# Patient Record
Sex: Female | Born: 1950 | Race: White | Hispanic: No | Marital: Married | Smoking: Former smoker
Health system: Southern US, Community
[De-identification: ages and names within clinical notes are randomized; demographics above are authoritative.]

## PROBLEM LIST (undated history)

## (undated) DIAGNOSIS — E785 Hyperlipidemia, unspecified: Secondary | ICD-10-CM

## (undated) DIAGNOSIS — F32A Depression, unspecified: Secondary | ICD-10-CM

## (undated) DIAGNOSIS — I4891 Unspecified atrial fibrillation: Secondary | ICD-10-CM

## (undated) DIAGNOSIS — I639 Cerebral infarction, unspecified: Secondary | ICD-10-CM

## (undated) DIAGNOSIS — I509 Heart failure, unspecified: Secondary | ICD-10-CM

## (undated) DIAGNOSIS — K219 Gastro-esophageal reflux disease without esophagitis: Secondary | ICD-10-CM

## (undated) DIAGNOSIS — M797 Fibromyalgia: Secondary | ICD-10-CM

## (undated) DIAGNOSIS — F329 Major depressive disorder, single episode, unspecified: Secondary | ICD-10-CM

## (undated) DIAGNOSIS — I6529 Occlusion and stenosis of unspecified carotid artery: Secondary | ICD-10-CM

## (undated) DIAGNOSIS — I809 Phlebitis and thrombophlebitis of unspecified site: Secondary | ICD-10-CM

## (undated) DIAGNOSIS — I10 Essential (primary) hypertension: Secondary | ICD-10-CM

## (undated) HISTORY — DX: Cerebral infarction, unspecified: I63.9

## (undated) HISTORY — DX: Phlebitis and thrombophlebitis of unspecified site: I80.9

## (undated) HISTORY — DX: Depression, unspecified: F32.A

## (undated) HISTORY — DX: Major depressive disorder, single episode, unspecified: F32.9

## (undated) HISTORY — DX: Hyperlipidemia, unspecified: E78.5

## (undated) HISTORY — DX: Occlusion and stenosis of unspecified carotid artery: I65.29

## (undated) HISTORY — DX: Unspecified atrial fibrillation: I48.91

## (undated) HISTORY — DX: Essential (primary) hypertension: I10

## (undated) HISTORY — DX: Fibromyalgia: M79.7

## (undated) HISTORY — PX: HERNIA REPAIR: SHX51

---
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
## 1989-05-24 HISTORY — PX: CHOLECYSTECTOMY: SHX55

## 1996-05-24 HISTORY — PX: NECK SURGERY: SHX720

## 1998-01-06 ENCOUNTER — Other Ambulatory Visit: Admission: RE | Admit: 1998-01-06 | Discharge: 1998-01-06 | Payer: Self-pay | Admitting: *Deleted

## 1998-01-08 ENCOUNTER — Ambulatory Visit (HOSPITAL_COMMUNITY): Admission: RE | Admit: 1998-01-08 | Discharge: 1998-01-08 | Payer: Self-pay | Admitting: Internal Medicine

## 1999-06-30 ENCOUNTER — Other Ambulatory Visit: Admission: RE | Admit: 1999-06-30 | Discharge: 1999-06-30 | Payer: Self-pay | Admitting: Obstetrics and Gynecology

## 1999-07-01 ENCOUNTER — Other Ambulatory Visit: Admission: RE | Admit: 1999-07-01 | Discharge: 1999-07-01 | Payer: Self-pay | Admitting: *Deleted

## 2000-08-04 ENCOUNTER — Other Ambulatory Visit: Admission: RE | Admit: 2000-08-04 | Discharge: 2000-08-04 | Payer: Self-pay | Admitting: *Deleted

## 2000-08-30 ENCOUNTER — Other Ambulatory Visit: Admission: RE | Admit: 2000-08-30 | Discharge: 2000-08-30 | Payer: Self-pay | Admitting: *Deleted

## 2001-03-14 ENCOUNTER — Encounter: Admission: RE | Admit: 2001-03-14 | Discharge: 2001-06-12 | Payer: Self-pay

## 2001-03-24 ENCOUNTER — Encounter: Payer: Self-pay | Admitting: Internal Medicine

## 2001-03-24 ENCOUNTER — Ambulatory Visit (HOSPITAL_COMMUNITY): Admission: RE | Admit: 2001-03-24 | Discharge: 2001-03-24 | Payer: Self-pay | Admitting: Internal Medicine

## 2001-06-01 ENCOUNTER — Encounter: Payer: Self-pay | Admitting: Anesthesiology

## 2001-06-01 ENCOUNTER — Ambulatory Visit (HOSPITAL_COMMUNITY): Admission: RE | Admit: 2001-06-01 | Discharge: 2001-06-01 | Payer: Self-pay | Admitting: Anesthesiology

## 2001-07-14 ENCOUNTER — Encounter: Payer: Self-pay | Admitting: Neurosurgery

## 2001-07-17 ENCOUNTER — Inpatient Hospital Stay (HOSPITAL_COMMUNITY): Admission: RE | Admit: 2001-07-17 | Discharge: 2001-07-18 | Payer: Self-pay | Admitting: Neurosurgery

## 2001-07-17 ENCOUNTER — Encounter: Payer: Self-pay | Admitting: Neurosurgery

## 2003-01-16 ENCOUNTER — Other Ambulatory Visit: Admission: RE | Admit: 2003-01-16 | Discharge: 2003-01-16 | Payer: Self-pay | Admitting: Obstetrics and Gynecology

## 2003-04-04 ENCOUNTER — Ambulatory Visit (HOSPITAL_COMMUNITY): Admission: RE | Admit: 2003-04-04 | Discharge: 2003-04-04 | Payer: Self-pay | Admitting: Internal Medicine

## 2004-05-04 ENCOUNTER — Ambulatory Visit: Payer: Self-pay | Admitting: Internal Medicine

## 2004-06-30 ENCOUNTER — Ambulatory Visit: Payer: Self-pay | Admitting: Internal Medicine

## 2004-07-14 ENCOUNTER — Ambulatory Visit: Payer: Self-pay | Admitting: Internal Medicine

## 2004-07-28 ENCOUNTER — Ambulatory Visit: Payer: Self-pay | Admitting: Internal Medicine

## 2004-08-11 ENCOUNTER — Ambulatory Visit: Payer: Self-pay | Admitting: Internal Medicine

## 2004-09-16 ENCOUNTER — Ambulatory Visit: Payer: Self-pay | Admitting: Internal Medicine

## 2004-09-30 ENCOUNTER — Ambulatory Visit: Payer: Self-pay | Admitting: Internal Medicine

## 2004-10-28 ENCOUNTER — Ambulatory Visit: Payer: Self-pay | Admitting: Internal Medicine

## 2004-11-13 ENCOUNTER — Ambulatory Visit: Payer: Self-pay | Admitting: Internal Medicine

## 2005-01-21 ENCOUNTER — Ambulatory Visit: Payer: Self-pay | Admitting: Internal Medicine

## 2005-03-25 ENCOUNTER — Ambulatory Visit: Payer: Self-pay | Admitting: Internal Medicine

## 2005-06-08 ENCOUNTER — Ambulatory Visit: Payer: Self-pay | Admitting: Internal Medicine

## 2005-06-29 ENCOUNTER — Ambulatory Visit: Payer: Self-pay | Admitting: Internal Medicine

## 2005-09-08 ENCOUNTER — Ambulatory Visit: Payer: Self-pay | Admitting: Internal Medicine

## 2005-09-29 ENCOUNTER — Ambulatory Visit: Payer: Self-pay | Admitting: Internal Medicine

## 2007-03-03 DIAGNOSIS — E785 Hyperlipidemia, unspecified: Secondary | ICD-10-CM | POA: Insufficient documentation

## 2007-03-03 DIAGNOSIS — M797 Fibromyalgia: Secondary | ICD-10-CM | POA: Insufficient documentation

## 2007-03-03 DIAGNOSIS — E1165 Type 2 diabetes mellitus with hyperglycemia: Secondary | ICD-10-CM | POA: Insufficient documentation

## 2007-03-03 DIAGNOSIS — I10 Essential (primary) hypertension: Secondary | ICD-10-CM | POA: Insufficient documentation

## 2010-06-13 ENCOUNTER — Encounter: Payer: Self-pay | Admitting: Internal Medicine

## 2011-11-08 ENCOUNTER — Ambulatory Visit (INDEPENDENT_AMBULATORY_CARE_PROVIDER_SITE_OTHER): Payer: Medicare Other | Admitting: Family Medicine

## 2011-11-08 ENCOUNTER — Encounter: Payer: Self-pay | Admitting: Family Medicine

## 2011-11-08 VITALS — BP 132/84 | HR 60 | Resp 18 | Ht 60.0 in | Wt 190.0 lb

## 2011-11-08 DIAGNOSIS — F329 Major depressive disorder, single episode, unspecified: Secondary | ICD-10-CM

## 2011-11-08 DIAGNOSIS — E119 Type 2 diabetes mellitus without complications: Secondary | ICD-10-CM

## 2011-11-08 DIAGNOSIS — E669 Obesity, unspecified: Secondary | ICD-10-CM | POA: Insufficient documentation

## 2011-11-08 DIAGNOSIS — G4700 Insomnia, unspecified: Secondary | ICD-10-CM

## 2011-11-08 DIAGNOSIS — F32A Depression, unspecified: Secondary | ICD-10-CM | POA: Insufficient documentation

## 2011-11-08 DIAGNOSIS — IMO0001 Reserved for inherently not codable concepts without codable children: Secondary | ICD-10-CM

## 2011-11-08 DIAGNOSIS — I10 Essential (primary) hypertension: Secondary | ICD-10-CM

## 2011-11-08 DIAGNOSIS — F3289 Other specified depressive episodes: Secondary | ICD-10-CM

## 2011-11-08 DIAGNOSIS — E785 Hyperlipidemia, unspecified: Secondary | ICD-10-CM

## 2011-11-08 MED ORDER — PAROXETINE HCL 20 MG PO TABS: 20.0000 mg | ORAL_TABLET | ORAL | Status: DC

## 2011-11-08 MED ORDER — TRAZODONE HCL 50 MG PO TABS: 50.0000 mg | ORAL_TABLET | Freq: Every day | ORAL | Status: DC

## 2011-11-08 MED ORDER — OMEPRAZOLE MAGNESIUM 20 MG PO TBEC: 20.0000 mg | DELAYED_RELEASE_TABLET | Freq: Every day | ORAL | Status: DC

## 2011-11-08 MED ORDER — GLYBURIDE-METFORMIN 5-500 MG PO TABS: 1.0000 | ORAL_TABLET | Freq: Two times a day (BID) | ORAL | Status: DC

## 2011-11-08 NOTE — Assessment & Plan Note (Signed)
Uncontrolled diabetic state. She is on a very large regimen of insulin as well as oral medication. I will obtain the records from her recent visit as well as her labs. She was given a sample of Humalog here in the office.

## 2011-11-08 NOTE — Assessment & Plan Note (Signed)
I will ask trazodone for her sleep

## 2011-11-08 NOTE — Patient Instructions (Addendum)
Please bring in the form for your Lyrica I will send a new prescription for your Humalog pen  I will obtain records Bring your diabetes meter or a log book  F/U in 6 weeks

## 2011-11-08 NOTE — Assessment & Plan Note (Signed)
Continue Crestor 40mg

## 2011-11-08 NOTE — Assessment & Plan Note (Signed)
She will continue Paxil. She's been on this for 30 years per report.

## 2011-11-08 NOTE — Assessment & Plan Note (Signed)
Continue low-dose ACE inhibitor blood pressure just above goal. I will monitor this this may need to be titrated up

## 2011-11-08 NOTE — Assessment & Plan Note (Signed)
I think this is definitely contributing to her depression as well. She does help a lot of family issues. I am willing to keep her on Lyrica she will bring in the form that she needs completed.

## 2011-11-08 NOTE — Progress Notes (Signed)
  Subjective:    Patient ID: Tina [name], female    DOB: 1950-10-17, 61 y.o.   MRN: [id number]  HPI Patient here to establish care. Previous primary care provider Dr. Polly Cobia. Dr.Buist- [facility] Medications and history reviewed. She's never had a colonoscopy she is overdue for mammogram. Diabetes mellitus most recent A1c 9.2% per patient. She takes 30 units of Lantus the morning and 40 in the afternoon she is also on Glucovance. She states she was recently started on Humalog flex pen 15 units with each meal. She ran out of this. She's been on insulin for the past 2 years Fibromyalgia has a history of this with pain all over. She's currently on Lyrica. She gets her prescriptions for a Lyrica drug company she has a form she will like me to complete for this. She was seen by pain clinic in [place] which they gave her epidural injections in the thoracic region however this did not help and she's gained significant weight from this.    Review of Systems GEN- denies fatigue, fever, weight loss,weakness, recent illness HEENT- denies eye drainage, change in vision, nasal discharge, CVS- denies chest pain, palpitations RESP- denies SOB, cough, wheeze ABD- denies N/V, change in stools, abd pain GU- denies dysuria, hematuria, dribbling, incontinence MSK- denies joint pain, muscle aches, injury Neuro- denies headache, dizziness, syncope, seizure activity, Psych- +insomnia, +depression       Objective:   Physical Exam GEN- NAD, alert and oriented x3, obese HEENT- PERRL, EOMI, non injected sclera, pink conjunctiva, MMM, oropharynx clear Neck- Supple, no thyromegaly, well healed surgical scar on neck  CVS- RRR, no murmur RESP-CTAB ABD-NABS,soft, NT,ND EXT- Mild pedal edema Pulses- Radial, DP- 2+ Psych-normal affect and Mood        Assessment & Plan:

## 2011-11-12 ENCOUNTER — Telehealth (INDEPENDENT_AMBULATORY_CARE_PROVIDER_SITE_OTHER): Payer: Self-pay | Admitting: *Deleted

## 2011-11-12 ENCOUNTER — Other Ambulatory Visit: Payer: Self-pay

## 2011-11-12 MED ORDER — ASPIRIN 81 MG PO TABS: 81.0000 mg | ORAL_TABLET | Freq: Every day | ORAL | Status: DC

## 2011-11-12 MED ORDER — RAMIPRIL 2.5 MG PO TABS: 2.5000 mg | ORAL_TABLET | Freq: Every day | ORAL | Status: DC

## 2011-11-12 NOTE — Telephone Encounter (Signed)
I called and left message, she can take 2 of the paxil tablets  I called and spoke with pharmacy, she is on prilosec 20mg  which is what I sent, she actually has not had her paxil since June 2012 Message left on answering machine

## 2011-11-12 NOTE — Telephone Encounter (Signed)
Tina [name] called and said she is a new patient and the doctor sent in her Paroxetine 20 mg and Omeprozole 10 mg. She said the Paroxetine should have been 40 mg and Omeprozole should have been 20 mg. Tekia also needs her Ramipril 2.5 mg and Asprin refilled. Patient's return phone number is [phone].

## 2011-11-12 NOTE — Telephone Encounter (Signed)
Ramipril already refilled. Said wrong med strengths were sent. Please advise

## 2011-11-17 ENCOUNTER — Telehealth: Payer: Self-pay | Admitting: Family Medicine

## 2011-11-17 MED ORDER — METOPROLOL TARTRATE 100 MG PO TABS: 100.0000 mg | ORAL_TABLET | Freq: Two times a day (BID) | ORAL | Status: DC

## 2011-11-17 NOTE — Telephone Encounter (Signed)
Refill sent to [facility]

## 2011-11-29 ENCOUNTER — Telehealth: Payer: Self-pay | Admitting: Family Medicine

## 2011-11-30 ENCOUNTER — Encounter: Payer: Self-pay | Admitting: Family Medicine

## 2011-11-30 ENCOUNTER — Ambulatory Visit (INDEPENDENT_AMBULATORY_CARE_PROVIDER_SITE_OTHER): Payer: Medicare Other | Admitting: Family Medicine

## 2011-11-30 VITALS — BP 140/84 | HR 65 | Resp 16 | Ht 60.0 in | Wt 192.4 lb

## 2011-11-30 DIAGNOSIS — R42 Dizziness and giddiness: Secondary | ICD-10-CM

## 2011-11-30 DIAGNOSIS — G4700 Insomnia, unspecified: Secondary | ICD-10-CM

## 2011-11-30 DIAGNOSIS — W19XXXA Unspecified fall, initial encounter: Secondary | ICD-10-CM

## 2011-11-30 DIAGNOSIS — E119 Type 2 diabetes mellitus without complications: Secondary | ICD-10-CM

## 2011-11-30 MED ORDER — METOPROLOL TARTRATE 100 MG PO TABS: 100.0000 mg | ORAL_TABLET | Freq: Two times a day (BID) | ORAL | Status: DC

## 2011-11-30 MED ORDER — TRAZODONE HCL 50 MG PO TABS: 50.0000 mg | ORAL_TABLET | Freq: Every day | ORAL | Status: DC

## 2011-11-30 MED ORDER — GLYBURIDE-METFORMIN 5-500 MG PO TABS: 1.0000 | ORAL_TABLET | Freq: Two times a day (BID) | ORAL | Status: DC

## 2011-11-30 MED ORDER — MECLIZINE HCL 25 MG PO TABS: 25.0000 mg | ORAL_TABLET | Freq: Three times a day (TID) | ORAL | Status: AC | PRN

## 2011-11-30 MED ORDER — OMEPRAZOLE MAGNESIUM 20 MG PO TBEC: 20.0000 mg | DELAYED_RELEASE_TABLET | Freq: Every day | ORAL | Status: DC

## 2011-11-30 NOTE — Patient Instructions (Addendum)
Try the meclizine for the vertigo Try the trazodone for sleep Continue current medications Increase the Lantus in AM to 25 units Get the labs done before the next visit Keep appt at end of July

## 2011-11-30 NOTE — Telephone Encounter (Signed)
Pt was seen in the office today.  

## 2011-11-30 NOTE — Progress Notes (Signed)
  Subjective:    Patient ID: Tina [name], female    DOB: Oct 07, 1950, 61 y.o.   MRN: [id number]  HPI Pt here for vertigo and ear pain x 3-4 days, she has had vertigo in the past, she fell loosing her balance a few times at home, she hit her head but denies any pain, headache, change in vision.  DM- meter today shows CBG running 200-300's, both fasting and post prandial she is giving Lantus 23 units in AM and 40 in evening, 15 units of short acting with meals Still unable to sleep did not get the trazadone sent in last time.   Review of Systems  GEN- denies fatigue, fever, weight loss,weakness, recent illness HEENT- denies eye drainage, change in vision, nasal discharge, CVS- denies chest pain, palpitations RESP- denies SOB, cough, wheeze ABD- denies N/V, change in stools, abd pain GU- denies dysuria, hematuria, dribbling, incontinence MSK- denies joint pain, muscle aches, injury Neuro- denies headache, +dizziness, syncope, seizure activity      Objective:   Physical Exam GEN- NAD, alert and oriented x3, obese HEENT- PERRL, EOMI, non injected sclera, pink conjunctiva, MMM, oropharynx clear, fundoscopic exam benign Neck- Supple, no thyromegaly, well healed surgical scar on neck  CVS- RRR, no murmur RESP-CTAB EXT- No edema Pulses- Radial, DP- 2+ Neuro-CNII-XII in tact, no focal deficits, neg rhomberg, normal gait        Assessment & Plan:

## 2011-12-01 ENCOUNTER — Encounter: Payer: Self-pay | Admitting: Family Medicine

## 2011-12-01 DIAGNOSIS — W19XXXA Unspecified fall, initial encounter: Secondary | ICD-10-CM | POA: Insufficient documentation

## 2011-12-01 DIAGNOSIS — R42 Dizziness and giddiness: Secondary | ICD-10-CM | POA: Insufficient documentation

## 2011-12-01 NOTE — Assessment & Plan Note (Signed)
Fall with no injury, no sign of concussion, no HA, exam reassuring today

## 2011-12-01 NOTE — Assessment & Plan Note (Signed)
Today she tells me she is taking a different amount of insulin, increase AM dose to 25 units, labs to be done f/u 2 weeks

## 2011-12-01 NOTE — Assessment & Plan Note (Signed)
trazodone sent again to pharmacy for sleep

## 2011-12-01 NOTE — Assessment & Plan Note (Signed)
Trial of meclizine, may need Vestibular training/PT

## 2011-12-06 ENCOUNTER — Ambulatory Visit (INDEPENDENT_AMBULATORY_CARE_PROVIDER_SITE_OTHER): Payer: Medicare Other | Admitting: Family Medicine

## 2011-12-06 ENCOUNTER — Encounter (HOSPITAL_COMMUNITY): Payer: Self-pay | Admitting: *Deleted

## 2011-12-06 ENCOUNTER — Observation Stay (HOSPITAL_COMMUNITY)
Admission: EM | Admit: 2011-12-06 | Discharge: 2011-12-07 | Disposition: A | Discharge status: Home / Self Care | Payer: Medicare Other | Attending: Internal Medicine | Admitting: Internal Medicine

## 2011-12-06 ENCOUNTER — Emergency Department (HOSPITAL_COMMUNITY): Payer: Medicare Other

## 2011-12-06 ENCOUNTER — Telehealth: Payer: Self-pay | Admitting: Family Medicine

## 2011-12-06 ENCOUNTER — Encounter: Payer: Self-pay | Admitting: Family Medicine

## 2011-12-06 VITALS — BP 146/90 | HR 64 | Resp 20 | Ht 60.0 in | Wt 201.0 lb

## 2011-12-06 DIAGNOSIS — I10 Essential (primary) hypertension: Secondary | ICD-10-CM | POA: Diagnosis present

## 2011-12-06 DIAGNOSIS — R609 Edema, unspecified: Secondary | ICD-10-CM

## 2011-12-06 DIAGNOSIS — R600 Localized edema: Secondary | ICD-10-CM | POA: Insufficient documentation

## 2011-12-06 DIAGNOSIS — F329 Major depressive disorder, single episode, unspecified: Secondary | ICD-10-CM | POA: Diagnosis present

## 2011-12-06 DIAGNOSIS — Z794 Long term (current) use of insulin: Secondary | ICD-10-CM | POA: Insufficient documentation

## 2011-12-06 DIAGNOSIS — IMO0001 Reserved for inherently not codable concepts without codable children: Secondary | ICD-10-CM

## 2011-12-06 DIAGNOSIS — R0602 Shortness of breath: Secondary | ICD-10-CM

## 2011-12-06 DIAGNOSIS — IMO0002 Reserved for concepts with insufficient information to code with codable children: Secondary | ICD-10-CM | POA: Diagnosis present

## 2011-12-06 DIAGNOSIS — R42 Dizziness and giddiness: Secondary | ICD-10-CM | POA: Diagnosis present

## 2011-12-06 DIAGNOSIS — E119 Type 2 diabetes mellitus without complications: Secondary | ICD-10-CM

## 2011-12-06 DIAGNOSIS — E669 Obesity, unspecified: Secondary | ICD-10-CM | POA: Diagnosis present

## 2011-12-06 DIAGNOSIS — I509 Heart failure, unspecified: Principal | ICD-10-CM | POA: Diagnosis present

## 2011-12-06 DIAGNOSIS — G4700 Insomnia, unspecified: Secondary | ICD-10-CM

## 2011-12-06 DIAGNOSIS — W19XXXA Unspecified fall, initial encounter: Secondary | ICD-10-CM

## 2011-12-06 DIAGNOSIS — Z79899 Other long term (current) drug therapy: Secondary | ICD-10-CM | POA: Insufficient documentation

## 2011-12-06 DIAGNOSIS — Z6838 Body mass index (BMI) 38.0-38.9, adult: Secondary | ICD-10-CM | POA: Insufficient documentation

## 2011-12-06 DIAGNOSIS — K219 Gastro-esophageal reflux disease without esophagitis: Secondary | ICD-10-CM | POA: Diagnosis present

## 2011-12-06 DIAGNOSIS — E1165 Type 2 diabetes mellitus with hyperglycemia: Secondary | ICD-10-CM | POA: Diagnosis present

## 2011-12-06 DIAGNOSIS — F32A Depression, unspecified: Secondary | ICD-10-CM | POA: Diagnosis present

## 2011-12-06 DIAGNOSIS — M797 Fibromyalgia: Secondary | ICD-10-CM | POA: Diagnosis present

## 2011-12-06 DIAGNOSIS — E785 Hyperlipidemia, unspecified: Secondary | ICD-10-CM | POA: Diagnosis present

## 2011-12-06 HISTORY — DX: Gastro-esophageal reflux disease without esophagitis: K21.9

## 2011-12-06 LAB — GLUCOSE, CAPILLARY: Glucose-Capillary: 104 mg/dL — ABNORMAL HIGH (ref 70–99)

## 2011-12-06 LAB — CBC WITH DIFFERENTIAL/PLATELET
Eosinophils Absolute: 0.1 10*3/uL (ref 0.0–0.7)
Eosinophils Relative: 2 % (ref 0–5)
Lymphs Abs: 3.7 10*3/uL (ref 0.7–4.0)
MCHC: 34.8 g/dL (ref 30.0–36.0)
Monocytes Relative: 7 % (ref 3–12)
Neutro Abs: 3.7 10*3/uL (ref 1.7–7.7)
Neutrophils Relative %: 46 % (ref 43–77)
Platelets: 182 10*3/uL (ref 150–400)
RDW: 12.8 % (ref 11.5–15.5)
WBC: 8.1 10*3/uL (ref 4.0–10.5)

## 2011-12-06 LAB — URINALYSIS, ROUTINE W REFLEX MICROSCOPIC
Glucose, UA: NEGATIVE mg/dL
Hgb urine dipstick: NEGATIVE
Ketones, ur: NEGATIVE mg/dL
Protein, ur: NEGATIVE mg/dL
pH: 7 (ref 5.0–8.0)

## 2011-12-06 LAB — CARDIAC PANEL(CRET KIN+CKTOT+MB+TROPI)
CK, MB: 1.9 ng/mL (ref 0.3–4.0)
Total CK: 93 U/L (ref 7–177)
Troponin I: <0.3 ng/mL (ref <0.30)

## 2011-12-06 LAB — COMPREHENSIVE METABOLIC PANEL
ALT: 40 U/L — ABNORMAL HIGH (ref 0–35)
Alkaline Phosphatase: 71 U/L (ref 39–117)
BUN: 12 mg/dL (ref 6–23)
CO2: 26 mEq/L (ref 19–32)
GFR calc Af Amer: >90 mL/min (ref >90)
GFR calc non Af Amer: >90 mL/min (ref >90)
Glucose, Bld: 97 mg/dL (ref 70–99)
Potassium: 4 mEq/L (ref 3.5–5.1)
Sodium: 137 mEq/L (ref 135–145)

## 2011-12-06 MED ORDER — OMEPRAZOLE MAGNESIUM 20 MG PO TBEC: 40.0000 mg | DELAYED_RELEASE_TABLET | ORAL | Status: DC

## 2011-12-06 MED ORDER — ACETAMINOPHEN 325 MG PO TABS: 650.0000 mg | ORAL_TABLET | ORAL | Status: DC | PRN

## 2011-12-06 MED ORDER — SODIUM CHLORIDE 0.9 % IV SOLN: INTRAVENOUS | Status: DC

## 2011-12-06 MED ORDER — ATORVASTATIN CALCIUM 40 MG PO TABS: 80.0000 mg | ORAL_TABLET | Freq: Every day | ORAL | Status: DC

## 2011-12-06 MED ORDER — POTASSIUM CHLORIDE CRYS ER 20 MEQ PO TBCR
40.0000 meq | EXTENDED_RELEASE_TABLET | Freq: Two times a day (BID) | ORAL | Status: DC
  Administered 2011-12-06 – 2011-12-07 (×2): 40 meq via ORAL
  Filled 2011-12-06 (×2): qty 2

## 2011-12-06 MED ORDER — FUROSEMIDE 10 MG/ML IJ SOLN
40.0000 mg | Freq: Once | INTRAMUSCULAR | Status: AC
  Administered 2011-12-06: 40 mg via INTRAVENOUS
  Filled 2011-12-06: qty 4

## 2011-12-06 MED ORDER — ONDANSETRON HCL 4 MG/2ML IJ SOLN: 4.0000 mg | INTRAMUSCULAR | Status: DC | PRN

## 2011-12-06 MED ORDER — HYDRALAZINE HCL 20 MG/ML IJ SOLN: 10.0000 mg | INTRAMUSCULAR | Status: DC | PRN

## 2011-12-06 MED ORDER — PAROXETINE HCL 20 MG PO TABS
40.0000 mg | ORAL_TABLET | Freq: Every day | ORAL | Status: DC
  Administered 2011-12-07: 40 mg via ORAL
  Filled 2011-12-06: qty 2

## 2011-12-06 MED ORDER — PANTOPRAZOLE SODIUM 40 MG PO TBEC
80.0000 mg | DELAYED_RELEASE_TABLET | Freq: Every day | ORAL | Status: DC
  Administered 2011-12-07: 80 mg via ORAL
  Filled 2011-12-06: qty 2

## 2011-12-06 MED ORDER — PREGABALIN 50 MG PO CAPS: 100.0000 mg | ORAL_CAPSULE | Freq: Every day | ORAL | Status: DC | PRN

## 2011-12-06 MED ORDER — SODIUM CHLORIDE 0.9 % IV SOLN: 250.0000 mL | INTRAVENOUS | Status: DC | PRN

## 2011-12-06 MED ORDER — SODIUM CHLORIDE 0.9 % IJ SOLN
3.0000 mL | Freq: Two times a day (BID) | INTRAMUSCULAR | Status: DC
  Administered 2011-12-06 – 2011-12-07 (×2): 3 mL via INTRAVENOUS
  Filled 2011-12-06: qty 3

## 2011-12-06 MED ORDER — FUROSEMIDE 10 MG/ML IJ SOLN
40.0000 mg | Freq: Two times a day (BID) | INTRAMUSCULAR | Status: DC
  Administered 2011-12-06 – 2011-12-07 (×2): 40 mg via INTRAVENOUS
  Filled 2011-12-06 (×2): qty 4

## 2011-12-06 MED ORDER — ASPIRIN 81 MG PO TABS
81.0000 mg | ORAL_TABLET | Freq: Every day | ORAL | Status: DC
  Filled 2011-12-06 (×2): qty 1

## 2011-12-06 MED ORDER — SODIUM CHLORIDE 0.9 % IJ SOLN: 3.0000 mL | INTRAMUSCULAR | Status: DC | PRN

## 2011-12-06 MED ORDER — MECLIZINE HCL 12.5 MG PO TABS: 25.0000 mg | ORAL_TABLET | Freq: Three times a day (TID) | ORAL | Status: DC | PRN

## 2011-12-06 MED ORDER — ENOXAPARIN SODIUM 40 MG/0.4ML ~~LOC~~ SOLN
40.0000 mg | SUBCUTANEOUS | Status: DC
  Administered 2011-12-06: 40 mg via SUBCUTANEOUS
  Filled 2011-12-06: qty 0.4

## 2011-12-06 MED ORDER — METOPROLOL TARTRATE 50 MG PO TABS
100.0000 mg | ORAL_TABLET | Freq: Two times a day (BID) | ORAL | Status: DC
  Administered 2011-12-06 – 2011-12-07 (×2): 100 mg via ORAL
  Filled 2011-12-06 (×2): qty 2

## 2011-12-06 MED ORDER — INSULIN GLARGINE 100 UNIT/ML ~~LOC~~ SOLN
50.0000 [IU] | Freq: Every day | SUBCUTANEOUS | Status: DC
  Administered 2011-12-06: 50 [IU] via SUBCUTANEOUS

## 2011-12-06 MED ORDER — LOSARTAN POTASSIUM 50 MG PO TABS
25.0000 mg | ORAL_TABLET | Freq: Every day | ORAL | Status: DC
  Administered 2011-12-06 – 2011-12-07 (×2): 25 mg via ORAL
  Filled 2011-12-06 (×2): qty 1

## 2011-12-06 NOTE — ED Provider Notes (Signed)
History   This chart was scribed for Laray Anger, DO by Shari Heritage. The patient was seen in room APA14/[facility]14.    CSN: [id number]  Arrival date & time 12/06/11  1536   First MD Initiated Contact with Patient 12/06/11 1555      Chief Complaint  Patient presents with  . Shortness of Breath    HPI Pt was seen at 1605. Tina [name] is a 61 y.o. female who presents to the Emergency Department complaining of gradual onset and worsening of persistent bilat "legs swelling" for the past 2-3 days.  Has been associated with SOB.  Pt was eval by her PMD PTA, then sent to the ED for further eval/admit.  Denies CP/palpitations, no abd pain, no N/V/D, no fevers, no tingling/numbness in extremities, no focal motor weakness.     PCP - [facility] Past Medical History  Diagnosis Date  . Hyperlipidemia   . Hypertension   . Diabetes mellitus   . Depression   . Fibromyalgia     Past Surgical History  Procedure Date  . Hernia repair approx 1969  . Tonsillectomy and adenoidectomy 1966  . Cholecystectomy 1991  . Neck surgery 1998    Family History  Problem Relation Age of Onset  . Hypertension Mother   . Hyperlipidemia Mother   . Heart disease Mother   . Stroke Sister   . Hypertension Sister   . Hyperlipidemia Sister   . Heart disease Sister     History  Substance Use Topics  . Smoking status: Former Games developer  . Smokeless tobacco: Not on file  . Alcohol Use: No    Review of Systems ROS: Statement: All systems negative except as marked or noted in the HPI; Constitutional: Negative for fever and chills. ; ; Eyes: Negative for eye pain, redness and discharge. ; ; ENMT: Negative for ear pain, hoarseness, nasal congestion, sinus pressure and sore throat. ; ; Cardiovascular: Negative for chest pain, palpitations, diaphoresis, +dyspnea and peripheral edema. ; ; Respiratory: Negative for cough, wheezing and stridor. ; ; Gastrointestinal: Negative for nausea, vomiting, diarrhea, abdominal  pain, blood in stool, hematemesis, jaundice and rectal bleeding. . ; ; Genitourinary: Negative for dysuria, flank pain and hematuria. ; ; Musculoskeletal: Negative for back pain and neck pain. Negative for swelling and trauma.; ; Skin: Negative for pruritus, rash, abrasions, blisters, bruising and skin lesion.; ; Neuro: Negative for headache, lightheadedness and neck stiffness. Negative for weakness, altered level of consciousness , altered mental status, extremity weakness, paresthesias, involuntary movement, seizure and syncope.     Allergies  Cefuroxime axetil; Enalapril maleate; Penicillins; Sulfur; Vasotec; and Welchol  Home Medications   Current Outpatient Rx  Name Route Sig Dispense Refill  . ASPIRIN 81 MG PO TABS Oral Take 1 tablet (81 mg total) by mouth daily. 90 tablet 0  . GLYBURIDE-METFORMIN 5-500 MG PO TABS Oral Take 1 tablet by mouth 2 (two) times daily with a meal. 30 tablet 3  . IBUPROFEN 800 MG PO TABS Oral Take 800 mg by mouth every 8 (eight) hours as needed.    . INSULIN GLARGINE 100 UNIT/ML [place] SOLN Subcutaneous Inject 30 Units into the skin 2 (two) times daily. 30 units in the morning and 40 units at bedtime    . INSULIN LISPRO (HUMAN) 100 UNIT/ML [place] SOLN Subcutaneous Inject into the skin 3 (three) times daily before meals.    Marland Kitchen MECLIZINE HCL 25 MG PO TABS Oral Take 1 tablet (25 mg total) by mouth 3 (three)  times daily as needed for dizziness or nausea. 30 tablet 1  . METOPROLOL TARTRATE 100 MG PO TABS Oral Take 1 tablet (100 mg total) by mouth 2 (two) times daily. 60 tablet 3  . OMEPRAZOLE MAGNESIUM 20 MG PO TBEC Oral Take 1 tablet (20 mg total) by mouth daily. 30 tablet 3  . PAROXETINE HCL 40 MG PO TABS Oral Take 40 mg by mouth every morning.    Marland Kitchen PREGABALIN 100 MG PO CAPS Oral Take 100 mg by mouth as needed.    Marland Kitchen RAMIPRIL 2.5 MG PO TABS Oral Take 1 tablet (2.5 mg total) by mouth daily. 90 tablet 00  . ROSUVASTATIN CALCIUM 40 MG PO TABS Oral Take 40 mg by mouth daily.    .  TRAZODONE HCL 50 MG PO TABS Oral Take 1 tablet (50 mg total) by mouth at bedtime. For sleep as needed 30 tablet 3    BP 151/93  Pulse 61  Temp 98.2 F (36.8 C) (Oral)  Resp 24  Ht 5' (1.524 m)  Wt 201 lb (91.173 kg)  BMI 39.26 kg/m2  SpO2 99%  Physical Exam 1610: Physical examination:  Nursing notes reviewed; Vital signs and O2 SAT reviewed;  Constitutional: Well developed, Well nourished, Well hydrated, In no acute distress; Head:  Normocephalic, atraumatic; Eyes: EOMI, PERRL, No scleral icterus; ENMT: Mouth and pharynx normal, Mucous membranes moist; Neck: Supple, Full range of motion, No lymphadenopathy; Cardiovascular: Regular rate and rhythm, No gallop; Respiratory: Breath sounds clear & equal bilaterally, No wheezes.  Speaking full sentences with ease, Normal respiratory effort/excursion; Chest: Nontender, Movement normal; Abdomen: Soft, Nontender, Nondistended, Normal bowel sounds;; Extremities: Pulses normal, No tenderness, 1-2+ pedal edema bilat, No calf edema or asymmetry.; Neuro: AA&Ox3, Major CN grossly intact.  Speech clear. No gross focal motor or sensory deficits in extremities.; Skin: Color normal, Warm, Dry.   ED Course  Procedures    MDM  MDM Reviewed: nursing note, vitals and previous chart Reviewed previous: ECG Interpretation: ECG, labs and x-ray    Date: 12/06/2011  Rate: 65  Rhythm: normal sinus rhythm  QRS Axis: normal  Intervals: normal  ST/T Wave abnormalities: normal  Conduction Disutrbances:none  Narrative Interpretation:   Old EKG Reviewed: unchanged; no significant changes from previous EKG dated 07/14/2001.  Results for orders placed during the hospital encounter of 12/06/11  PRO B NATRIURETIC PEPTIDE      Component Value Range   Pro B Natriuretic peptide (BNP) 466.5 (*) 0 - 125 pg/mL  TROPONIN I      Component Value Range   Troponin I <0.30  <0.30 ng/mL  COMPREHENSIVE METABOLIC PANEL      Component Value Range   Sodium 137  135 - 145  mEq/L   Potassium 4.0  3.5 - 5.1 mEq/L   Chloride 102  96 - 112 mEq/L   CO2 26  19 - 32 mEq/L   Glucose, Bld 97  70 - 99 mg/dL   BUN 12  6 - 23 mg/dL   Creatinine, Ser 1.19  0.50 - 1.10 mg/dL   Calcium 9.4  8.4 - 14.7 mg/dL   Total Protein 6.8  6.0 - 8.3 g/dL   Albumin 3.5  3.5 - 5.2 g/dL   AST 40 (*) 0 - 37 U/L   ALT 40 (*) 0 - 35 U/L   Alkaline Phosphatase 71  39 - 117 U/L   Total Bilirubin 0.4  0.3 - 1.2 mg/dL   GFR calc non Af Amer >90  >90  mL/min   GFR calc Af Amer >90  >90 mL/min  CBC WITH DIFFERENTIAL      Component Value Range   WBC 8.1  4.0 - 10.5 K/uL   RBC 3.83 (*) 3.87 - 5.11 MIL/uL   Hemoglobin 12.5  12.0 - 15.0 g/dL   HCT 16.1 (*) 09.6 - 04.5 %   MCV 93.7  78.0 - 100.0 fL   MCH 32.6  26.0 - 34.0 pg   MCHC 34.8  30.0 - 36.0 g/dL   RDW 40.9  81.1 - 91.4 %   Platelets 182  150 - 400 K/uL   Neutrophils Relative 46  43 - 77 %   Neutro Abs 3.7  1.7 - 7.7 K/uL   Lymphocytes Relative 46  12 - 46 %   Lymphs Abs 3.7  0.7 - 4.0 K/uL   Monocytes Relative 7  3 - 12 %   Monocytes Absolute 0.5  0.1 - 1.0 K/uL   Eosinophils Relative 2  0 - 5 %   Eosinophils Absolute 0.1  0.0 - 0.7 K/uL   Basophils Relative 0  0 - 1 %   Basophils Absolute 0.0  0.0 - 0.1 K/uL  URINALYSIS, ROUTINE W REFLEX MICROSCOPIC      Component Value Range   Color, Urine YELLOW  YELLOW   APPearance CLEAR  CLEAR   Specific Gravity, Urine 1.015  1.005 - 1.030   pH 7.0  5.0 - 8.0   Glucose, UA NEGATIVE  NEGATIVE mg/dL   Hgb urine dipstick NEGATIVE  NEGATIVE   Bilirubin Urine NEGATIVE  NEGATIVE   Ketones, ur NEGATIVE  NEGATIVE mg/dL   Protein, ur NEGATIVE  NEGATIVE mg/dL   Urobilinogen, UA 0.2  0.0 - 1.0 mg/dL   Nitrite NEGATIVE  NEGATIVE   Leukocytes, UA NEGATIVE  NEGATIVE  GLUCOSE, CAPILLARY      Component Value Range   Glucose-Capillary 104 (*) 70 - 99 mg/dL   Comment 1 Notify RN     Dg Chest 2 View 12/06/2011  *RADIOLOGY REPORT*  Clinical Data: Cough, diffuse swelling, evaluate for pulmonary  infiltrates, CHF, free air  CHEST - 2 VIEW  Comparison: None.  Findings: Normal cardiac silhouette and mediastinal contours. Mild pulmonary venous congestion without frank evidence of edema.  No focal airspace opacity.  No definite pleural effusion or pneumothorax.  Post lower cervical/upper thoracic ACDF.  No acute osseous abnormalities.  Post cholecystectomy. No definite pneumoperitoneum.  IMPRESSION: 1.  Mild pulmonary venous congestion without frank evidence of pulmonary edema.  2. No definite pneumoperitoneum.  Further evaluation with dedicated abdominal radiographic series may be performed as clinically indicated.  Original Report Authenticated By: Waynard Reeds, M.D.     1940:  New CHF, will dose IV lasix.  Dx testing d/w pt and family.  Questions answered.  Verb understanding, agreeable to admit.  T/C to [facility] Dr. Orvan Falconer, case discussed, including:  HPI, pertinent PM/SHx, VS/PE, dx testing, ED course and treatment:  Agreeable to come to ED to eval for observation admit.      I personally performed the services described in this documentation, which was scribed in my presence. The recorded information has been reviewed and considered. Alexios Keown Allison Quarry, DO 12/08/11 2201

## 2011-12-06 NOTE — Telephone Encounter (Signed)
Pt needs appt first

## 2011-12-06 NOTE — Assessment & Plan Note (Signed)
Per above we'll transfer to the ED for workup of leg edema and possible congestive heart failure

## 2011-12-06 NOTE — ED Notes (Signed)
Pt updated on status. Pt informed of admit status, waiting on bed assignment.

## 2011-12-06 NOTE — Assessment & Plan Note (Signed)
With her shortness of breath leg edema and 9 pound weight gain and concern about acute CHF. She does have other comorbidities such as hypertension and diabetes mellitus which are uncontrolled. Based on my exam today I think she warrants immediate care. She is being transferred to the emergency department I discussed with the attending. I discussed with her traveling back ambulating she declined is noting the risk of respiratory distress.

## 2011-12-06 NOTE — Assessment & Plan Note (Signed)
Elevated above baseline today

## 2011-12-06 NOTE — ED Notes (Signed)
Pt transferred to Room 330 on Telemetry by ED Tech, pt stable on transfer.

## 2011-12-06 NOTE — Telephone Encounter (Signed)
Pt to come in today at 3

## 2011-12-06 NOTE — ED Notes (Signed)
Breathing easier.  Eating crackers without difficulty.  Reports feels like she can go home.

## 2011-12-06 NOTE — ED Notes (Signed)
Sent from Dr Deirdre Peer office for eval, concerned about CHF  Pt says swelling of legs and sob. No chest pain

## 2011-12-06 NOTE — Patient Instructions (Signed)
Go straight to the ER  I am concerned about CHF Heart failure

## 2011-12-06 NOTE — ED Notes (Signed)
Bed assigned 330

## 2011-12-06 NOTE — H&P (Signed)
[facility] History and Physical  Tina [name] ZOX:[id number] DOB: 06-26-1950 DOA: 12/06/2011   PCP:   Milinda Antis, MD   Chief Complaint:  Progressive shortness of breath for 3 days  HPI: Tina [name] is an 61 y.o. female.   Obese middle-aged lady with hypertension, diabetes, hyperlipidemia, presents with progressive dyspnea on exertion, orthopnea, paroxysmal nocturnal dyspnea,  lower extremity edema and abdominal tightness for the past 3 days. She went to her primary care physician today with these symptoms and was sent to the emergency room for further evaluation. She has no history of heart failure  She did have an episode of palpitations last night but has had no chest pain, nausea or vomiting or diaphoresis.  Patient reports that prior to this she was at her baseline state of health until about one week ago  she started having episodes of vertigo aggravated by moving her head.  Rewiew of Systems:   All systems negative except as marked bold or noted in the HPI;  Constitutional: Negative for malaise, fever and chills. ;  Eyes: Negative for eye pain, redness and discharge. ;  ENMT: Negative for ear pain, hoarseness,, sinus pressure and sore throat. ;  nasal congestion, PND  Respiratory: Negative for hemoptysis, and stridor. ; chronic cough  Gastrointestinal: Negative for nausea, vomiting, diarrhea, constipation, abdominal pain, melena, blood in stool, hematemesis, jaundice and rectal bleeding. unusual weight loss.Marland Kitchen GERD  Genitourinary: Negative for frequency, dysuria, incontinence,flank pain and hematuria;  Musculoskeletal: Negative for  neck pain. Negative for swelling and trauma.; m episodic pain in muscles legs, arms, hands, back, due to fibromyalgia.  Skin: . Negative for pruritus, rash, abrasions, bruising and skin lesion.; ulcerations  Neuro: Negative for headache, lightheadedness and neck stiffness. Negative for weakness, altered level of consciousness , altered  mental status, extremity weakness, burning feet, involuntary movement, seizure and syncope.  Psych: negative for tearfulness, panic attacks, hallucinations, paranoia, suicidal or homicidal ideation; anxiety, depression, insomnia    Past Medical History  Diagnosis Date  . Hyperlipidemia   . Hypertension   . Diabetes mellitus   . Depression   . Fibromyalgia   . GERD (gastroesophageal reflux disease)     Past Surgical History  Procedure Date  . Hernia repair approx 1969  . Tonsillectomy and adenoidectomy 1966  . Cholecystectomy 1991  . Neck surgery 1998    Medications:  HOME MEDS: Prior to Admission medications   Medication Sig Start Date End Date Taking? Authorizing Provider  aspirin 81 MG tablet Take 81 mg by mouth every morning. 11/12/11  Yes Salley Scarlet, MD  glyBURIDE-metformin (GLUCOVANCE) 5-500 MG per tablet Take 1 tablet by mouth 2 (two) times daily with a meal. 11/30/11  Yes Salley Scarlet, MD  insulin glargine (LANTUS SOLOSTAR) 100 UNIT/ML injection Inject 37-40 Units into the skin 2 (two) times daily. Inject 37 units daily in the morning and 40 units as bedtime   Yes Historical Provider, MD  insulin lispro (HUMALOG KWIKPEN) 100 UNIT/ML injection Inject 15 Units into the skin 3 (three) times daily before meals. And as directed per sliding scale instructions   Yes Historical Provider, MD  meclizine (ANTIVERT) 25 MG tablet Take 1 tablet (25 mg total) by mouth 3 (three) times daily as needed for dizziness or nausea. 11/30/11 11/29/12 Yes Salley Scarlet, MD  metoprolol (LOPRESSOR) 100 MG tablet Take 1 tablet (100 mg total) by mouth 2 (two) times daily. 11/30/11 11/29/12 Yes Salley Scarlet, MD  omeprazole (PRILOSEC OTC) 20  MG tablet Take 20 mg by mouth every morning. 11/30/11  Yes Salley Scarlet, MD  PARoxetine (PAXIL) 40 MG tablet Take 40 mg by mouth every morning.   Yes Historical Provider, MD  pregabalin (LYRICA) 100 MG capsule Take 100 mg by mouth as needed. For pain   Yes  Historical Provider, MD  ramipril (ALTACE) 2.5 MG tablet Take 2.5 mg by mouth every morning. 11/12/11  Yes Salley Scarlet, MD  rosuvastatin (CRESTOR) 40 MG tablet Take 40 mg by mouth at bedtime.    Yes Historical Provider, MD     Allergies:  Allergies  Allergen Reactions  . Cefuroxime Axetil     REACTION: unspecified  . Enalapril Maleate     REACTION: cough  . Penicillins     REACTION: unspecified  . Sulfur   . Vasotec (Enalapril)   . Welchol (Colesevelam Hcl)     Muscle aches and joint pains   has cough with enalapril  Social History:   reports that she quit smoking about 19 months ago. She does not have any smokeless tobacco history on file. She reports that she does not drink alcohol or use illicit drugs.  Family History: Family History  Problem Relation Age of Onset  . Hypertension Mother   . Hyperlipidemia Mother   . Heart disease Mother   . Stroke Sister   . Hypertension Sister   . Hyperlipidemia Sister   . Heart disease Sister      Physical Exam: Filed Vitals:   12/06/11 1643 12/06/11 1847 12/06/11 1900 12/06/11 2101  BP: 163/94 147/89 132/96 142/71  Pulse: 66 83 58 61  Temp:    98.1 F (36.7 C)  TempSrc:    Oral  Resp:  18 24 22   Height:    5' (1.524 m)  Weight:    89.54 kg (197 lb 6.4 oz)  SpO2:  98% 98% 96%   Blood pressure 142/71, pulse 61, temperature 98.1 F (36.7 C), temperature source Oral, resp. rate 22, height 5' (1.524 m), weight 89.54 kg (197 lb 6.4 oz), SpO2 96.00%.  GEN:  Pleasant obese Caucasian lady reclining in bed in mild respiratory distress; cooperative with exam PSYCH:  alert and oriented x4; does ; affect is appropriate. HEENT: Mucous membranes pink and anicteric; PERRLA; EOM intact; no cervical lymphadenopathy nor thyromegaly or carotid bruit;  JVD about 8 cm; Breasts:: Not examined CHEST WALL: No tenderness CHEST: Tachypnea , clear to auscultation bilaterally,  No crackles no wheezing HEART: Regular rate and rhythm; no murmurs  rubs or gallops BACK: No kyphosis or scoliosis; no CVA tenderness ABDOMEN: Obese, soft non-tender; no masses, right flank dullness no organomegaly, normal abdominal bowel sounds;  Rectal Exam: Not done EXTREMITIES:; age-appropriate arthropathy of the hands and knees; trace edema; no ulcerations. Genitalia: not examined PULSES: 2+ and symmetric SKIN: Normal hydration no rash or ulceration CNS: Cranial nerves 2-12 grossly intact no focal lateralizing neurologic deficit   Labs on Admission:  Basic Metabolic Panel:   Lab 12/06/11 2110 12/06/11 1608  GLUCAP 153* 104*    Results for orders placed during the hospital encounter of 12/06/11 (from the past 48 hour(s))  GLUCOSE, CAPILLARY     Status: Abnormal   Collection Time   12/06/11  4:08 PM      Component Value Range Comment   Glucose-Capillary 104 (*) 70 - 99 mg/dL    Comment 1 Notify RN     URINALYSIS, ROUTINE W REFLEX MICROSCOPIC     Status: Normal  Collection Time   12/06/11  5:55 PM      Component Value Range Comment   Color, Urine YELLOW  YELLOW    APPearance CLEAR  CLEAR    Specific Gravity, Urine 1.015  1.005 - 1.030    pH 7.0  5.0 - 8.0    Glucose, UA NEGATIVE  NEGATIVE mg/dL    Hgb urine dipstick NEGATIVE  NEGATIVE    Bilirubin Urine NEGATIVE  NEGATIVE    Ketones, ur NEGATIVE  NEGATIVE mg/dL    Protein, ur NEGATIVE  NEGATIVE mg/dL    Urobilinogen, UA 0.2  0.0 - 1.0 mg/dL    Nitrite NEGATIVE  NEGATIVE    Leukocytes, UA NEGATIVE  NEGATIVE MICROSCOPIC NOT DONE ON URINES WITH NEGATIVE PROTEIN, BLOOD, LEUKOCYTES, NITRITE, OR GLUCOSE <1000 mg/dL.  PRO B NATRIURETIC PEPTIDE     Status: Abnormal   Collection Time   12/06/11  6:22 PM      Component Value Range Comment   Pro B Natriuretic peptide (BNP) 466.5 (*) 0 - 125 pg/mL   TROPONIN I     Status: Normal   Collection Time   12/06/11  6:22 PM      Component Value Range Comment   Troponin I <0.30  <0.30 ng/mL   COMPREHENSIVE METABOLIC PANEL     Status: Abnormal    Collection Time   12/06/11  6:22 PM      Component Value Range Comment   Sodium 137  135 - 145 mEq/L    Potassium 4.0  3.5 - 5.1 mEq/L    Chloride 102  96 - 112 mEq/L    CO2 26  19 - 32 mEq/L    Glucose, Bld 97  70 - 99 mg/dL    BUN 12  6 - 23 mg/dL    Creatinine, Ser 4.54  0.50 - 1.10 mg/dL    Calcium 9.4  8.4 - 09.8 mg/dL    Total Protein 6.8  6.0 - 8.3 g/dL    Albumin 3.5  3.5 - 5.2 g/dL    AST 40 (*) 0 - 37 U/L    ALT 40 (*) 0 - 35 U/L    Alkaline Phosphatase 71  39 - 117 U/L    Total Bilirubin 0.4  0.3 - 1.2 mg/dL    GFR calc non Af Amer >90  >90 mL/min    GFR calc Af Amer >90  >90 mL/min   CBC WITH DIFFERENTIAL     Status: Abnormal   Collection Time   12/06/11  6:22 PM      Component Value Range Comment   WBC 8.1  4.0 - 10.5 K/uL    RBC 3.83 (*) 3.87 - 5.11 MIL/uL    Hemoglobin 12.5  12.0 - 15.0 g/dL    HCT 11.9 (*) 14.7 - 46.0 %    MCV 93.7  78.0 - 100.0 fL    MCH 32.6  26.0 - 34.0 pg    MCHC 34.8  30.0 - 36.0 g/dL    RDW 82.9  56.2 - 13.0 %    Platelets 182  150 - 400 K/uL    Neutrophils Relative 46  43 - 77 %    Neutro Abs 3.7  1.7 - 7.7 K/uL    Lymphocytes Relative 46  12 - 46 %    Lymphs Abs 3.7  0.7 - 4.0 K/uL    Monocytes Relative 7  3 - 12 %    Monocytes Absolute 0.5  0.1 - 1.0 K/uL  Eosinophils Relative 2  0 - 5 %    Eosinophils Absolute 0.1  0.0 - 0.7 K/uL    Basophils Relative 0  0 - 1 %    Basophils Absolute 0.0  0.0 - 0.1 K/uL   GLUCOSE, CAPILLARY     Status: Abnormal   Collection Time   12/06/11  9:10 PM      Component Value Range Comment   Glucose-Capillary 153 (*) 70 - 99 mg/dL    Comment 1 Notify RN        Radiological Exams on Admission: Dg Chest 2 View  12/06/2011  *RADIOLOGY REPORT*  Clinical Data: Cough, diffuse swelling, evaluate for pulmonary infiltrates, CHF, free air  CHEST - 2 VIEW  Comparison: None.  Findings: Normal cardiac silhouette and mediastinal contours. Mild pulmonary venous congestion without frank evidence of edema.  No  focal airspace opacity.  No definite pleural effusion or pneumothorax.  Post lower cervical/upper thoracic ACDF.  No acute osseous abnormalities.  Post cholecystectomy. No definite pneumoperitoneum.  IMPRESSION: 1.  Mild pulmonary venous congestion without frank evidence of pulmonary edema.  2. No definite pneumoperitoneum.  Further evaluation with dedicated abdominal radiographic series may be performed as clinically indicated.  Original Report Authenticated By: Waynard Reeds, M.D.    EKG: Independently reviewed. Normal sinus rhythm no ST segment abnormalities   Assessment/Plan Present on Admission: Acute congestive heart failure, new onset   We'll admit on heart failure portal for diuresis and evaluation of ejection fraction; since she has a cough with enalapril, and is now having a chronic cough on ramipril, will discontinue ACE inhibitors completely and start an ARB trial. We'll continue her beta blocker since she is taking high doses, and blood pressure is borderline high. Cycle cardiac enzymes, although initially negative, looking for cause of sudden onset heart failure.   Marland KitchenDIABETES MELLITUS, TYPE II  Check hemoglobin A1c, once daily Lantus, discontinue glyburide, supplement short acting insulin  .Depression .FIBROMYALGIA .GERD (gastroesophageal reflux disease) .HYPERLIPIDEMIA .HYPERTENSION .Obesity, unspecified .Vertigo  Continue management of other chronic medical problems;  Discuss the importance of the diet and exercise in weight management.   Other plans as per orders.  Code Status: Full code Family Communication: Ex-husband was present for most of the interview and examination Disposition Plan:  Patient is admitted on observation; if not sufficient to optimize tomorrow may need to be converted to a full admission; may need cardiology evaluation as an in or outpatient   Tina [name] Nocturnist [facility] Pager [phone]   12/06/2011, 10:32 PM

## 2011-12-06 NOTE — Progress Notes (Signed)
  Subjective:    Patient ID: Tina [name], female    DOB: [birth year], 61 y.o.   MRN: [id number]  HPI Patient presents with progressive swelling in her lower extremities and hands. She is also short of breath. She states over the weekend she began to be short of breath and noticed swelling. She continued to drink water thinking she was not drinking enough. Her only new medication is trazodone which gave her bad dreams and she stopped this last week. She has never had shortness of breath and swelling this bad before. Has no history of congestive heart failure or liver disease. She's been taking her insulin as directed and noted that she had a hypoglycemic episode yesterday with a CBG to the 70s. She also continues to have dizzy spells.  She states she has gained 10 pounds since her last visit.  Review of Systems - per above   GEN- + fatigue, fever, weight loss,weakness, recent illness HEENT- denies eye drainage, change in vision, nasal discharge, CVS- denies chest pain, palpitations RESP- + SOB, +cough, +wheeze ABD- denies N/V, change in stools, abd pain GU- denies dysuria, hematuria, dribbling, incontinence MSK- + joint pain,+ muscle aches, injury Neuro- denies headache, +dizziness, syncope, seizure activity      Objective:   Physical Exam GEN- NAD, alert and oriented x3, obese, 9lb weight gain HEENT- PERRL, EOMI, non injected sclera, pink conjunctiva, MMM, oropharynx clear, Neck- Supple, hepatojular reflux elevated  CVS- RRR, no murmur RESP-decreased right base otherwise clear with and upper airway wheeze, increased WOB EXT- 1+ pitting edema lower ext, swelling in hands Pulses- Radial, DP- 2+ Neuro-CNII-XII in tact, no focal deficits, neg rhomberg, normal gait       Assessment & Plan:

## 2011-12-06 NOTE — ED Notes (Signed)
Pt ambulated to bathroom, no SOB noted on ambulation.

## 2011-12-07 DIAGNOSIS — E119 Type 2 diabetes mellitus without complications: Secondary | ICD-10-CM

## 2011-12-07 DIAGNOSIS — I10 Essential (primary) hypertension: Secondary | ICD-10-CM

## 2011-12-07 DIAGNOSIS — I517 Cardiomegaly: Secondary | ICD-10-CM

## 2011-12-07 DIAGNOSIS — R609 Edema, unspecified: Secondary | ICD-10-CM

## 2011-12-07 LAB — BASIC METABOLIC PANEL
BUN: 15 mg/dL (ref 6–23)
CO2: 32 mEq/L (ref 19–32)
GFR calc non Af Amer: 62 mL/min — ABNORMAL LOW (ref >90)
Glucose, Bld: 118 mg/dL — ABNORMAL HIGH (ref 70–99)
Potassium: 3.4 mEq/L — ABNORMAL LOW (ref 3.5–5.1)

## 2011-12-07 LAB — GLUCOSE, CAPILLARY
Glucose-Capillary: 151 mg/dL — ABNORMAL HIGH (ref 70–99)
Glucose-Capillary: 262 mg/dL — ABNORMAL HIGH (ref 70–99)

## 2011-12-07 LAB — URINE CULTURE: Colony Count: 15000

## 2011-12-07 LAB — HEMOGLOBIN A1C: Mean Plasma Glucose: 212 mg/dL — ABNORMAL HIGH (ref <117)

## 2011-12-07 LAB — CARDIAC PANEL(CRET KIN+CKTOT+MB+TROPI): CK, MB: 1.8 ng/mL (ref 0.3–4.0)

## 2011-12-07 MED ORDER — SODIUM CHLORIDE 0.9 % IJ SOLN
INTRAMUSCULAR | Status: AC
  Administered 2011-12-07: 10 mL
  Filled 2011-12-07: qty 3

## 2011-12-07 MED ORDER — INSULIN ASPART 100 UNIT/ML ~~LOC~~ SOLN
0.0000 [IU] | Freq: Three times a day (TID) | SUBCUTANEOUS | Status: DC
  Administered 2011-12-07: 2 [IU] via SUBCUTANEOUS
  Administered 2011-12-07: 5 [IU] via SUBCUTANEOUS

## 2011-12-07 MED ORDER — INSULIN ASPART 100 UNIT/ML ~~LOC~~ SOLN: 0.0000 [IU] | Freq: Every day | SUBCUTANEOUS | Status: DC

## 2011-12-07 MED ORDER — LOSARTAN POTASSIUM 25 MG PO TABS: 25.0000 mg | ORAL_TABLET | Freq: Every day | ORAL | Status: DC

## 2011-12-07 MED ORDER — ASPIRIN 81 MG PO CHEW
81.0000 mg | CHEWABLE_TABLET | Freq: Every day | ORAL | Status: DC
  Administered 2011-12-07: 81 mg via ORAL
  Filled 2011-12-07: qty 1

## 2011-12-07 MED ORDER — FUROSEMIDE 40 MG PO TABS: 40.0000 mg | ORAL_TABLET | Freq: Every day | ORAL | Status: DC

## 2011-12-07 NOTE — Plan of Care (Signed)
Problem: Discharge Progression Outcomes Goal: Pneumonia vaccine received if indicated Outcome: Not Met (add Reason) Pt refused.

## 2011-12-07 NOTE — Discharge Summary (Signed)
Physician Discharge Summary  Tina [name] ZOX:[id number] DOB: February 28, 1951 DOA: 12/06/2011  PCP: Milinda Antis, MD  Admit date: 12/06/2011 Discharge date: 12/07/2011  Discharge Diagnoses:    1. New-onset congestive heart failure, clinically compensated. No evidence of ischemia or infarction. She will need outpatient appointment with cardiology for further evaluation, we will arrange this. 2. Hypertension 3. Type 2 diabetes mellitus. 4. Hyperlipidemia. 5. Obesity.  Discharge Condition: Stable.  Diet recommendation: Carbohydrate modified diet.  History of present illness:  This 61 year old lady, who has a history of hypertension, diabetes, hyperlipidemia and is obese, presents to the hospital with symptoms of progressive dyspnea for the last 3 days. She described orthopnea, PND and lower extremity edema. There was no chest pain. She went to her primary care physician who sent her to the emergency room for further evaluation.  Hospital Course:  When she was evaluated in the emergency room, she was felt to be in congestive heart failure, mild. Chest x-ray confirmed this. She was admitted overnight and diuresis with intravenous Lasix. She is under well and her symptoms of completely resolved. She is very keen to go home now. Serial cardiac enzymes were negative.  Procedures:  None.  Consultations:  None.  Discharge Exam: Filed Vitals:   12/07/11 1044  BP: 156/78  Pulse: 56  Temp: 98.2 F (36.8 C)  Resp: 20   Filed Vitals:   12/06/11 2101 12/07/11 0607 12/07/11 0801 12/07/11 1044  BP: 142/71 156/85 128/76 156/78  Pulse: 61 60 62 56  Temp: 98.1 F (36.7 C) 97.9 F (36.6 C) 98.1 F (36.7 C) 98.2 F (36.8 C)  TempSrc: Oral  Oral Oral  Resp: 22 20 20 20   Height: 5' (1.524 m)     Weight: 89.54 kg (197 lb 6.4 oz) 88.633 kg (195 lb 6.4 oz)    SpO2: 96% 93% 96% 93%   General: She looks systemically well. Cardiovascular: Heart sounds present and normal without murmurs.  Jugular venous pressure not elevated. There is really no peripheral pitting edema in the legs today. Respiratory: Lung fields are entirely clear with no evidence of crackles.  Discharge Instructions  Discharge Orders    Future Appointments: Provider: Department: Dept Phone: Center:   12/21/2011 1:30 PM Salley Scarlet, MD [facility] [phone] [facility]     Future Orders Please Complete By Expires   Diet - low sodium heart healthy      Increase activity slowly        Medication List  As of 12/07/2011 11:24 AM   STOP taking these medications         ramipril 2.5 MG tablet         TAKE these medications         aspirin 81 MG tablet   Take 81 mg by mouth every morning.      furosemide 40 MG tablet   Commonly known as: LASIX   Take 1 tablet (40 mg total) by mouth daily.      glyBURIDE-metformin 5-500 MG per tablet   Commonly known as: GLUCOVANCE   Take 1 tablet by mouth 2 (two) times daily with a meal.      HUMALOG KWIKPEN 100 UNIT/ML injection   Generic drug: insulin lispro   Inject 15 Units into the skin 3 (three) times daily before meals. And as directed per sliding scale instructions      LANTUS SOLOSTAR 100 UNIT/ML injection   Generic drug: insulin glargine   Inject 37-40 Units into the skin 2 (  two) times daily. Inject 37 units daily in the morning and 40 units as bedtime      losartan 25 MG tablet   Commonly known as: COZAAR   Take 1 tablet (25 mg total) by mouth daily.      meclizine 25 MG tablet   Commonly known as: ANTIVERT   Take 1 tablet (25 mg total) by mouth 3 (three) times daily as needed for dizziness or nausea.      metoprolol 100 MG tablet   Commonly known as: LOPRESSOR   Take 1 tablet (100 mg total) by mouth 2 (two) times daily.      omeprazole 20 MG tablet   Commonly known as: PRILOSEC OTC   Take 20 mg by mouth every morning.      PARoxetine 40 MG tablet   Commonly known as: PAXIL   Take 40 mg by mouth every morning.      pregabalin 100  MG capsule   Commonly known as: LYRICA   Take 100 mg by mouth as needed. For pain      rosuvastatin 40 MG tablet   Commonly known as: CRESTOR   Take 40 mg by mouth at bedtime.              The results of significant diagnostics from this hospitalization (including imaging, microbiology, ancillary and laboratory) are listed below for reference.    Significant Diagnostic Studies: Dg Chest 2 View  12/06/2011  *RADIOLOGY REPORT*  Clinical Data: Cough, diffuse swelling, evaluate for pulmonary infiltrates, CHF, free air  CHEST - 2 VIEW  Comparison: None.  Findings: Normal cardiac silhouette and mediastinal contours. Mild pulmonary venous congestion without frank evidence of edema.  No focal airspace opacity.  No definite pleural effusion or pneumothorax.  Post lower cervical/upper thoracic ACDF.  No acute osseous abnormalities.  Post cholecystectomy. No definite pneumoperitoneum.  IMPRESSION: 1.  Mild pulmonary venous congestion without frank evidence of pulmonary edema.  2. No definite pneumoperitoneum.  Further evaluation with dedicated abdominal radiographic series may be performed as clinically indicated.  Original Report Authenticated By: Waynard Reeds, M.D.        Labs: Basic Metabolic Panel:  Lab 12/07/11 4098 12/06/11 1822  NA 143 137  K 3.4* 4.0  CL 101 102  CO2 32 26  GLUCOSE 118* 97  BUN 15 12  CREATININE 0.98 0.74  CALCIUM 9.8 9.4  MG -- --  PHOS -- --   Liver Function Tests:  Lab 12/06/11 1822  AST 40*  ALT 40*  ALKPHOS 71  BILITOT 0.4  PROT 6.8  ALBUMIN 3.5     CBC:  Lab 12/06/11 1822  WBC 8.1  NEUTROABS 3.7  HGB 12.5  HCT 35.9*  MCV 93.7  PLT 182   Cardiac Enzymes:  Lab 12/07/11 0439 12/06/11 2254 12/06/11 1822  CKTOTAL 89 93 --  CKMB 1.8 1.9 --  CKMBINDEX -- -- --  TROPONINI <0.30 <0.30 <0.30   BNP: BNP (last 3 results)  Basename 12/06/11 1822  PROBNP 466.5*   CBG:  Lab 12/07/11 0828 12/06/11 2110 12/06/11 1608  GLUCAP 151*  153* 104*    Time coordinating discharge: Less than 30 minutes.  SignedWilson Singer  [facility] 12/07/2011, 11:24 AM

## 2011-12-07 NOTE — Progress Notes (Signed)
UR Chart Review Completed  

## 2011-12-07 NOTE — Progress Notes (Signed)
*  PRELIMINARY RESULTS* Echocardiogram 2D Echocardiogram has been performed.  Caswell Corwin 12/07/2011, 12:10 PM

## 2011-12-07 NOTE — Care Management Note (Signed)
    Page 1 of 1   12/07/2011     2:03:45 PM   CARE MANAGEMENT NOTE 12/07/2011  Patient:  OUIDA, ABEYTA   Account Number:  [id number]  Date Initiated:  12/07/2011  Documentation initiated by:  Rosemary Holms  Subjective/Objective Assessment:   Pt admitted from home where she lives alone and independent. Pt had new onset of CHF. Has watched the CHF videos here at [facility].     Action/Plan:   Plans to DC home. She selected [facility] for [facility] RN and the Heart Failure Program.   Anticipated DC Date:  12/07/2011   Anticipated DC Plan:  HOME W HOME HEALTH SERVICES      DC Planning Services  CM consult      Choice offered to / List presented to:  C-1 Patient        HH arranged  HH-1 RN  HH-10 DISEASE MANAGEMENT      HH agency  [facility]   Status of service:  Completed, signed off Medicare Important Message given?   (If response is "NO", the following Medicare IM given date fields will be blank) Date Medicare IM given:   Date Additional Medicare IM given:    Discharge Disposition:  HOME W HOME HEALTH SERVICES  Per UR Regulation:    If discussed at Long Length of Stay Meetings, dates discussed:    Comments:  12/07/11 1130 Shilah Hefel Leanord Hawking RN BSN CM

## 2011-12-07 NOTE — Progress Notes (Signed)
Pt d/c to home with ex-husband who is very close friends with pt. Pt verbalizes understanding of diet, activity, medications, reasons to call MD, and pt given prescriptions. Pt has no questions at this time. Volunteer escorted pt out via wheelchair. HH to f/u with pt. Appointment made with Jeanelle Malling, to see Joni Reining, NP on Tuesday, December 14, 2011 at 1420. Sheryn Bison

## 2011-12-08 ENCOUNTER — Other Ambulatory Visit: Payer: Self-pay | Admitting: Family Medicine

## 2011-12-13 ENCOUNTER — Encounter: Payer: Self-pay | Admitting: Adult Health

## 2011-12-14 ENCOUNTER — Other Ambulatory Visit: Payer: Self-pay | Admitting: *Deleted

## 2011-12-14 ENCOUNTER — Encounter: Payer: Self-pay | Admitting: *Deleted

## 2011-12-14 ENCOUNTER — Encounter: Payer: Self-pay | Admitting: Adult Health

## 2011-12-14 ENCOUNTER — Ambulatory Visit (INDEPENDENT_AMBULATORY_CARE_PROVIDER_SITE_OTHER): Payer: Medicare Other | Admitting: Adult Health

## 2011-12-14 VITALS — BP 110/70 | HR 50 | Ht 60.0 in | Wt 189.1 lb

## 2011-12-14 DIAGNOSIS — I509 Heart failure, unspecified: Secondary | ICD-10-CM

## 2011-12-14 DIAGNOSIS — I10 Essential (primary) hypertension: Secondary | ICD-10-CM

## 2011-12-14 MED ORDER — GLUCOSE BLOOD VI STRP: ORAL_STRIP | Status: AC

## 2011-12-14 NOTE — Progress Notes (Signed)
HPI: Tina [name] is a 61 y/o patient we are seeing at the request of Dr. Randol Kern and Dr. Jeanice Lim, post admission to [facility] for CHF. She has no prior cardiac history but was admitted to [facility] with progressive dyspnea and wt gain over several weeks. BNP on admission was 466. Echocardiogram demonstrated mild concentric hypertrophy with normal EF of 60%. CXR  Mild pulmonary venous congestion without frank evidence of pulmonary edema. She was diuresed 11 lbs and discharged. She has a history of diabetes that has not been well controlled. HgB AIC was >9 on admission. She is recommitted to taking better care of herself. Other history includes hypertension and hypercholesterolemia. She also complains of back pain on the left side in the kidney area and frequent pain across her shoulders.   Allergies  Allergen Reactions  . Cefuroxime Axetil     REACTION: unspecified  . Enalapril Maleate     REACTION: cough  . Niaspan (Niacin Er)     Burning all over  . Penicillins     REACTION: unspecified  . Sulfur   . Vasotec (Enalapril)   . Welchol (Colesevelam Hcl)     Muscle aches and joint pains    Current Outpatient Prescriptions  Medication Sig Dispense Refill  . aspirin 81 MG tablet Take 81 mg by mouth every morning.      . furosemide (LASIX) 40 MG tablet Take 1 tablet (40 mg total) by mouth daily.  30 tablet  0  . glucose blood test strip Three times daily testing  100 each  5  . glyBURIDE-metformin (GLUCOVANCE) 5-500 MG per tablet TAKE 1 TABLET BY MOUTH TWICE DAILY WITH A MEAL  60 tablet  3  . insulin glargine (LANTUS SOLOSTAR) 100 UNIT/ML injection Inject 37-40 Units into the skin 2 (two) times daily. Inject 37 units daily in the morning and 40 units as bedtime      . insulin lispro (HUMALOG KWIKPEN) 100 UNIT/ML injection Inject 15 Units into the skin 3 (three) times daily before meals. And as directed per sliding scale instructions       . losartan (COZAAR) 25 MG tablet Take 1 tablet (25 mg total) by  mouth daily.  30 tablet  0  . meclizine (ANTIVERT) 25 MG tablet Take 1 tablet (25 mg total) by mouth 3 (three) times daily as needed for dizziness or nausea.  30 tablet  1  . metoprolol (LOPRESSOR) 100 MG tablet Take 1 tablet (100 mg total) by mouth 2 (two) times daily.  60 tablet  3  . omeprazole (PRILOSEC OTC) 20 MG tablet Take 20 mg by mouth every morning.      Marland Kitchen PARoxetine (PAXIL) 40 MG tablet Take 40 mg by mouth every morning.      . pregabalin (LYRICA) 100 MG capsule Take 100 mg by mouth as needed. For pain      . ramipril (ALTACE) 2.5 MG capsule Take 2.5 mg by mouth daily.       . rosuvastatin (CRESTOR) 40 MG tablet Take 40 mg by mouth at bedtime.       . traZODone (DESYREL) 50 MG tablet Take 50 mg by mouth at bedtime.         Past Medical History  Diagnosis Date  . Hyperlipidemia   . Hypertension   . Diabetes mellitus   . Depression   . Fibromyalgia   . GERD (gastroesophageal reflux disease)     Past Surgical History  Procedure Date  . Hernia repair approx 1969  .  Tonsillectomy and adenoidectomy 1966  . Cholecystectomy 1991  . Neck surgery 1998    Family History  Problem Relation Age of Onset  . Hypertension Mother   . Hyperlipidemia Mother   . Heart disease Mother   . Stroke Sister   . Hypertension Sister   . Hyperlipidemia Sister   . Heart disease Sister     History   Social History  . Marital Status: Married    Spouse Name: N/A    Number of Children: N/A  . Years of Education: N/A   Occupational History  . Not on file.   Social History Main Topics  . Smoking status: Former Smoker    Quit date: 05/07/2010  . Smokeless tobacco: Not on file  . Alcohol Use: No  . Drug Use: No  . Sexually Active: Not on file   Other Topics Concern  . Not on file   Social History Narrative  . No narrative on file    ZOX:WRUEAV of systems complete and found to be negative unless listed above PHYSICAL EXAM BP 110/70  Pulse 50  Ht 5' (1.524 m)  Wt 189 lb 1.9 oz  (85.784 kg)  BMI 36.93 kg/m2  General: Well developed, well nourished, in no acute distress Head: Eyes PERRLA, No xanthomas.   Normal cephalic and atramatic  Lungs: Clear bilaterally to auscultation and percussion. Heart: HRRR S1 S2, without MRG.  Pulses are 2+ & equal.            No carotid bruit. No JVD.  No abdominal bruits. No femoral bruits. Abdomen: Bowel sounds are positive, abdomen soft and non-tender without masses or                  Hernia's noted. Msk:  Back normal, normal gait. Normal strength and tone for age. Pain with palpation of the left lower back.  Extremities: No clubbing, cyanosis or edema.  DP +1 Neuro: Alert and oriented X 3. Psych:  Good affect, responds appropriately  EKG: Sinus bradycardia with sinus arrhythmia. Rate of 50 bpm.   ASSESSMENT AND PLAN

## 2011-12-14 NOTE — Assessment & Plan Note (Signed)
She has maintained the 11 lb weight loss and continues on lasix. She states that she continues to have shoulder discomfort at times. She will be scheduled for a stress myoview for diagnostic, prognostic purposes. Continue current medications with the exception of the metoprolol the day prior to the stress test. She states she can walk on the treadmill.   She has been seen and examined by Dr.Rothbart on this visit, who agrees with my assessment and plan.

## 2011-12-14 NOTE — Assessment & Plan Note (Signed)
Currently well controlled.Dr.Rothbart states that she told him that her BP was 180 systolic this am. Cannot rule out sleep apnea in this setting. We can consider sleep study post stress test should this be of more concern.

## 2011-12-14 NOTE — Patient Instructions (Addendum)
Concentrate on weight loss.  Avoid foods that are white, wheat or sweet.  Activity as tolerated-walk for exercise as tolerated.  Your physician recommends that you schedule a follow-up appointment with Joni Reining, NP after stress myoview.   Your physician has requested that you have an exercise stress myoview. For further information please visit [email or web address] Please follow instruction sheet, as given.

## 2011-12-20 LAB — BASIC METABOLIC PANEL
BUN: 19 mg/dL (ref 6–23)
CO2: 32 mEq/L (ref 19–32)
Chloride: 100 mEq/L (ref 96–112)
Creat: 0.76 mg/dL (ref 0.50–1.10)
Glucose, Bld: 261 mg/dL — ABNORMAL HIGH (ref 70–99)
Potassium: 4 mEq/L (ref 3.5–5.3)

## 2011-12-21 ENCOUNTER — Encounter: Payer: Self-pay | Admitting: Family Medicine

## 2011-12-21 ENCOUNTER — Ambulatory Visit (INDEPENDENT_AMBULATORY_CARE_PROVIDER_SITE_OTHER): Payer: Medicare Other | Admitting: Family Medicine

## 2011-12-21 VITALS — BP 124/80 | HR 50 | Resp 18 | Ht 60.0 in | Wt 195.1 lb

## 2011-12-21 DIAGNOSIS — E119 Type 2 diabetes mellitus without complications: Secondary | ICD-10-CM

## 2011-12-21 DIAGNOSIS — E669 Obesity, unspecified: Secondary | ICD-10-CM

## 2011-12-21 DIAGNOSIS — I509 Heart failure, unspecified: Secondary | ICD-10-CM

## 2011-12-21 DIAGNOSIS — G4700 Insomnia, unspecified: Secondary | ICD-10-CM

## 2011-12-21 DIAGNOSIS — I10 Essential (primary) hypertension: Secondary | ICD-10-CM

## 2011-12-21 MED ORDER — ZOLPIDEM TARTRATE 10 MG PO TABS: 10.0000 mg | ORAL_TABLET | Freq: Every evening | ORAL | Status: DC | PRN

## 2011-12-21 NOTE — Patient Instructions (Addendum)
Increase your water pill to 60mg  ( 1and 1/2 tablet) once a day  Increase your lantus to 40 units twice a day  Keep the novolog at 15 units  Nutritionist to be set up  Ambien for sleep- take 1 hour before bedtime  F/U 6 weeks

## 2011-12-22 ENCOUNTER — Encounter (HOSPITAL_COMMUNITY)
Admission: RE | Admit: 2011-12-22 | Discharge: 2011-12-22 | Disposition: A | Discharge status: Home / Self Care | Payer: Medicare Other | Source: Ambulatory Visit | Attending: Adult Health | Admitting: Adult Health

## 2011-12-22 ENCOUNTER — Telehealth (HOSPITAL_COMMUNITY): Payer: Self-pay | Admitting: Dietician

## 2011-12-22 ENCOUNTER — Encounter (HOSPITAL_COMMUNITY): Payer: Self-pay | Admitting: Cardiology

## 2011-12-22 ENCOUNTER — Ambulatory Visit (INDEPENDENT_AMBULATORY_CARE_PROVIDER_SITE_OTHER): Payer: Medicare Other

## 2011-12-22 ENCOUNTER — Telehealth: Payer: Self-pay

## 2011-12-22 ENCOUNTER — Encounter (HOSPITAL_COMMUNITY): Payer: Self-pay

## 2011-12-22 DIAGNOSIS — I509 Heart failure, unspecified: Secondary | ICD-10-CM | POA: Insufficient documentation

## 2011-12-22 DIAGNOSIS — R0602 Shortness of breath: Secondary | ICD-10-CM

## 2011-12-22 DIAGNOSIS — I10 Essential (primary) hypertension: Secondary | ICD-10-CM

## 2011-12-22 HISTORY — DX: Heart failure, unspecified: I50.9

## 2011-12-22 MED ORDER — INSULIN PEN NEEDLE 31G X 5 MM MISC: Status: DC

## 2011-12-22 MED ORDER — TECHNETIUM TC 99M TETROFOSMIN IV KIT
10.0000 | PACK | Freq: Once | INTRAVENOUS | Status: AC | PRN
  Administered 2011-12-22: 10.2 via INTRAVENOUS

## 2011-12-22 MED ORDER — TECHNETIUM TC 99M TETROFOSMIN IV KIT
30.0000 | PACK | Freq: Once | INTRAVENOUS | Status: AC | PRN
  Administered 2011-12-22: 29 via INTRAVENOUS

## 2011-12-22 NOTE — Progress Notes (Signed)
Stress Lab Nurses Notes - Tina [name]  Tina [name] 12/22/2011 Reason for doing test: Dyspnea and CHF Type of test: Stress Myoview and Test Changed unable to finish TM, SOB. Switched test to [employer]. Nurse performing test: Parke Poisson, RN Nuclear Medicine Tech: Lou Cal Echo Tech: Not Applicable MD performing test: R. Rothbart Family MD:  [facility] explained and consent signed: yes IV started: 22g jelco, Saline lock flushed, No redness or edema and Saline lock started in radiology Symptoms: lightheaded & stomach discomfort Treatment/Intervention: None Reason test stopped: protocol completed After recovery IV was: Discontinued via X-ray tech and No redness or edema Patient to return to Nuc. Med at : 11:45 Patient discharged: Home Patient's Condition upon discharge was: stable Comments: During test BP 158/72 & HR 85.  Recovery BP 122/72 & HR 67.  Symptoms resolved in recovery. Tina [name] T

## 2011-12-22 NOTE — Telephone Encounter (Addendum)
Spoke with patient. Reports she is interested, but she has been at the hospital every day this week. Requested 12/28/11 appointment, as she has a cardiologist appointment at 11 AM that day, but no openings that day. She reports that she receives [facility] and wants to inquire about their RD coming to her house, as this possibility would be more convenient for her. Pt reports she will call back once she verifies her schedule and if [facility] RD is unable to see her.

## 2011-12-22 NOTE — Telephone Encounter (Signed)
Received referral via fax from Dr. Jeanice Lim for dx: diabetes, CHF.

## 2011-12-22 NOTE — Telephone Encounter (Signed)
Resent in

## 2011-12-22 NOTE — Telephone Encounter (Signed)
[facility] called and said they received a rx for humalog pen needles but they don't have her taking humalog. They want to know if you still want her on the novolog or if you want to change her to humalog? Please advise

## 2011-12-22 NOTE — Telephone Encounter (Signed)
My mistake she has a novolog pen, please call and send in novolog pen needles

## 2011-12-23 ENCOUNTER — Encounter: Payer: Self-pay | Admitting: Family Medicine

## 2011-12-23 DIAGNOSIS — I10 Essential (primary) hypertension: Secondary | ICD-10-CM

## 2011-12-23 DIAGNOSIS — I509 Heart failure, unspecified: Secondary | ICD-10-CM

## 2011-12-23 DIAGNOSIS — IMO0002 Reserved for concepts with insufficient information to code with codable children: Secondary | ICD-10-CM

## 2011-12-23 DIAGNOSIS — E119 Type 2 diabetes mellitus without complications: Secondary | ICD-10-CM

## 2011-12-23 NOTE — Assessment & Plan Note (Signed)
Nutritionist to be set up with [facility] care

## 2011-12-23 NOTE — Assessment & Plan Note (Addendum)
Uncontrolled but improved, will increase lantus to 40 units BID, continue humalog  ACEI stopped secondary to chronic cough and CHF

## 2011-12-23 NOTE — Assessment & Plan Note (Signed)
Followed by cardiology, weight has decompensated, will increase lasix to 60mg 

## 2011-12-23 NOTE — Assessment & Plan Note (Signed)
Trial of Ambien. 

## 2011-12-23 NOTE — Assessment & Plan Note (Signed)
BP at goal 

## 2011-12-23 NOTE — Progress Notes (Signed)
  Subjective:    Patient ID: Tina [name], female    DOB: 09-Jun-1950, 61 y.o.   MRN: [id number]  HPI   Pt presents for hospital follow-up. Admitted after last visit for acute new onset CHF. She is to see cardiology next week.Continues to gain weght, her dry weight was 189lbs, up to 195lbs now. Feels SOB with little exertion. Her blood sugars have also been elevated, A1C was 9%. CBG fasting 217 this AM, taking Humalog 15 units with meals, Lantus 37 units in AM, 40 units in pm.  Labs reviewed   Review of Systems   GEN- + fatigue, fever, weight loss,weakness, recent illness HEENT- denies eye drainage, change in vision, nasal discharge, CVS- denies chest pain, palpitations RESP- +SOB, +cough, wheeze ABD- denies N/V, change in stools, abd pain GU- denies dysuria, hematuria, dribbling, incontinence MSK- + joint pain, muscle aches, injury Neuro- denies headache, dizziness, syncope, seizure activity       Objective:   Physical Exam  GEN- NAD, alert and oriented x3, obese, HEENT- PERRL, EOMI, non injected sclera, pink conjunctiva, MMM, oropharynx clear, Neck- Supple,  CVS- RRR, no murmur RESP-CTAB EXT- 1+ pitting edema lower ext, swelling in hands Pulses- Radial, DP- 2+       Assessment & Plan:

## 2011-12-28 ENCOUNTER — Ambulatory Visit (INDEPENDENT_AMBULATORY_CARE_PROVIDER_SITE_OTHER): Payer: Medicare Other | Admitting: Adult Health

## 2011-12-28 ENCOUNTER — Encounter: Payer: Self-pay | Admitting: Adult Health

## 2011-12-28 VITALS — BP 106/78 | HR 64 | Resp 24 | Ht 60.0 in | Wt 195.1 lb

## 2011-12-28 DIAGNOSIS — I509 Heart failure, unspecified: Secondary | ICD-10-CM

## 2011-12-28 DIAGNOSIS — I10 Essential (primary) hypertension: Secondary | ICD-10-CM

## 2011-12-28 NOTE — Assessment & Plan Note (Addendum)
Review of blood pressure log shows me that her blood pressure is high in the morning in the 150s to 160s. It normalizes throughout the day after taking her medication. She admits to snoring and believese she has sleep apnea, but does not wish to pursue any testing. I have advised her that this is most probably has her blood pressure up first thing in the morning. She wishes to try weight loss and exercise before considering any testing or need for CPAP. I have changed her losartan to 25 mg at at bedtime. Hopefully this will help with early morning hypertension., Although I believe that sleep apnea is strongly contributing to this. She can discuss this further with her primary care physician.

## 2011-12-28 NOTE — Progress Notes (Signed)
HPI: Tina [name] is a 61 y/o patient we are seeing at the request of Dr. Randol Kern and Dr. Jeanice Lim, post admission to [facility] for CHF. She has no prior cardiac history but was admitted to [facility] with progressive dyspnea and wt gain over several weeks. BNP on admission was 466. Echocardiogram demonstrated mild concentric hypertrophy with normal EF of 60%. CXR  Mild pulmonary venous congestion without frank evidence of pulmonary edema. She was diuresed 11 lbs and discharged. She has a history of diabetes that has not been well controlled. HgB AIC was >9 on admission. She is recommitted to taking better care of herself. Other history includes hypertension and hypercholesterolemia. She also complains of back pain on the left side in the kidney area and frequent pain across her shoulders.    On last visit a stress Myoview was ordered secondary to recurrent discomfort in her chest. She is here to discuss results. In the interim she has been taking her blood pressures has increased her activity and has walked daily. She is followed by primary care physician had recent labs from last week.  Allergies  Allergen Reactions  . Cefuroxime Axetil     REACTION: unspecified  . Enalapril Maleate     REACTION: cough  . Niaspan (Niacin Er)     Burning all over  . Penicillins     REACTION: unspecified  . Sulfur   . Vasotec (Enalapril)   . Welchol (Colesevelam Hcl)     Muscle aches and joint pains    Current Outpatient Prescriptions  Medication Sig Dispense Refill  . aspirin 81 MG tablet Take 81 mg by mouth every morning.      . furosemide (LASIX) 40 MG tablet Take 50 mg by mouth daily.      Marland Kitchen glucose blood test strip Three times daily testing  100 each  5  . glyBURIDE-metformin (GLUCOVANCE) 5-500 MG per tablet TAKE 1 TABLET BY MOUTH TWICE DAILY WITH A MEAL  60 tablet  3  . insulin glargine (LANTUS SOLOSTAR) 100 UNIT/ML injection Inject 37-40 Units into the skin 2 (two) times daily. Inject 37 units daily in the  morning and 40 units as bedtime      . insulin lispro (HUMALOG KWIKPEN) 100 UNIT/ML injection Inject 15 Units into the skin 3 (three) times daily before meals. And as directed per sliding scale instructions       . Insulin Pen Needle (B-D UF III MINI PEN NEEDLES) 31G X 5 MM MISC For use with novolog pen  100 each  5  . losartan (COZAAR) 25 MG tablet Take 1 tablet (25 mg total) by mouth daily.  30 tablet  0  . metoprolol (LOPRESSOR) 100 MG tablet Take 1 tablet (100 mg total) by mouth 2 (two) times daily.  60 tablet  3  . omeprazole (PRILOSEC OTC) 20 MG tablet Take 20 mg by mouth every morning.      Marland Kitchen PARoxetine (PAXIL) 40 MG tablet Take 40 mg by mouth every morning.      . pregabalin (LYRICA) 100 MG capsule Take 100 mg by mouth as needed. For pain      . rosuvastatin (CRESTOR) 40 MG tablet Take 40 mg by mouth at bedtime.       Marland Kitchen DISCONTD: furosemide (LASIX) 40 MG tablet Take 1 tablet (40 mg total) by mouth daily.  30 tablet  0  . meclizine (ANTIVERT) 25 MG tablet Take 1 tablet (25 mg total) by mouth 3 (three) times daily as needed  for dizziness or nausea.  30 tablet  1  . zolpidem (AMBIEN) 10 MG tablet Take 1 tablet (10 mg total) by mouth at bedtime as needed for sleep.  15 tablet  2    Past Medical History  Diagnosis Date  . Hyperlipidemia   . Hypertension   . Diabetes mellitus   . Depression   . Fibromyalgia   . GERD (gastroesophageal reflux disease)   . CHF (congestive heart failure)     Past Surgical History  Procedure Date  . Hernia repair approx 1969  . Tonsillectomy and adenoidectomy 1966  . Cholecystectomy 1991  . Neck surgery 1998    Family History  Problem Relation Age of Onset  . Hypertension Mother   . Hyperlipidemia Mother   . Heart disease Mother   . Stroke Sister   . Hypertension Sister   . Hyperlipidemia Sister   . Heart disease Sister     History   Social History  . Marital Status: Married    Spouse Name: N/A    Number of Children: N/A  . Years of  Education: N/A   Occupational History  . Not on file.   Social History Main Topics  . Smoking status: Former Smoker    Quit date: 05/07/2010  . Smokeless tobacco: Not on file  . Alcohol Use: No  . Drug Use: No  . Sexually Active: Not on file   Other Topics Concern  . Not on file   Social History Narrative  . No narrative on file    BJY:NWGNFA of systems complete and found to be negative unless listed above PHYSICAL EXAM BP 106/78  Pulse 64  Resp 24  Ht 5' (1.524 m)  Wt 195 lb 1.3 oz (88.488 kg)  BMI 38.10 kg/m2  General: Well developed, well nourished, in no acute distress Head: Eyes PERRLA, No xanthomas.   Normal cephalic and atramatic  Lungs: Clear bilaterally to auscultation and percussion. Heart: HRRR S1 S2, without MRG.  Pulses are 2+ & equal.            No carotid bruit. No JVD.  No abdominal bruits. No femoral bruits. Abdomen: Bowel sounds are positive, abdomen soft and non-tender without masses or                  Hernia's noted. Msk:  Back normal, normal gait. Normal strength and tone for age. Pain with palpation of the left lower back.  Extremities: No clubbing, cyanosis or edema.  DP +1 Neuro: Alert and oriented X 3. Psych:  Good affect, responds appropriately  EKG: Sinus bradycardia with sinus arrhythmia. Rate of 50 bpm.   ASSESSMENT AND PLAN

## 2011-12-28 NOTE — Patient Instructions (Addendum)
Your physician has recommended you make the following change in your medication: Take your cozaar in the pm instead of the am  Your physician wants you to follow-up in: 6 months.   You will receive a reminder letter in the mail two months in advance. If you don't receive a letter, please call our office to schedule the follow-up appointment.

## 2011-12-28 NOTE — Assessment & Plan Note (Signed)
She has no symptoms or clinical signs of CHF. Her weight is up a couple of pounds since being seen last. She denies any use of salt or foods containing salt. She sometimes feels that her breathing status changes when she has gained a few pounds and she states that she feels today I told her that if she gains over 2-3 pounds and 24-hour she has take an additional 20 mg of Lasix. She is currently on 60 mg daily. She will increase it to 80 mg daily on days when she has begun to retain some fluid. She has not been advised to weigh herself daily and continue her blood pressure recordings. I reviewed her last labs potassium and creatinine were fine with a potassium of 4.0 and creatinine of 0.76. Will see her again in 6 months unless she becomes symptomatic.

## 2011-12-30 ENCOUNTER — Telehealth: Payer: Self-pay | Admitting: Family Medicine

## 2011-12-30 MED ORDER — LOSARTAN POTASSIUM 25 MG PO TABS: 25.0000 mg | ORAL_TABLET | Freq: Every day | ORAL | Status: DC

## 2011-12-30 MED ORDER — FUROSEMIDE 40 MG PO TABS: ORAL_TABLET | ORAL | Status: DC

## 2011-12-30 NOTE — Telephone Encounter (Signed)
Medication sent.

## 2011-12-30 NOTE — Telephone Encounter (Signed)
i sent in the losartan but is the lasix on her medlist the correct dose? If so I will refill

## 2012-01-03 ENCOUNTER — Telehealth (HOSPITAL_COMMUNITY): Payer: Self-pay | Admitting: Dietician

## 2012-01-03 NOTE — Telephone Encounter (Signed)
Received another referral on pt. Noted referral received on 12/22/11. See previous telephone encounter for details. Pt to investigate [facility] RD to visit her home, as she has [facility] and this option would be more convenient for her.

## 2012-02-01 ENCOUNTER — Ambulatory Visit: Payer: Medicare Other | Admitting: Family Medicine

## 2012-03-10 ENCOUNTER — Ambulatory Visit: Payer: Medicare Other | Admitting: Family Medicine

## 2012-03-15 ENCOUNTER — Other Ambulatory Visit: Payer: Self-pay

## 2012-03-15 MED ORDER — PAROXETINE HCL 40 MG PO TABS: 40.0000 mg | ORAL_TABLET | ORAL | Status: DC

## 2012-03-30 ENCOUNTER — Telehealth: Payer: Self-pay | Admitting: Family Medicine

## 2012-03-30 MED ORDER — FUROSEMIDE 40 MG PO TABS: ORAL_TABLET | ORAL | Status: DC

## 2012-03-30 NOTE — Telephone Encounter (Signed)
Refill sent.

## 2012-04-12 ENCOUNTER — Other Ambulatory Visit: Payer: Self-pay | Admitting: Family Medicine

## 2012-04-14 ENCOUNTER — Encounter: Payer: Self-pay | Admitting: Family Medicine

## 2012-04-14 ENCOUNTER — Ambulatory Visit (INDEPENDENT_AMBULATORY_CARE_PROVIDER_SITE_OTHER): Payer: Medicare Other | Admitting: Family Medicine

## 2012-04-14 VITALS — BP 142/86 | HR 74 | Resp 18 | Ht 60.0 in | Wt 196.0 lb

## 2012-04-14 DIAGNOSIS — E119 Type 2 diabetes mellitus without complications: Secondary | ICD-10-CM

## 2012-04-14 DIAGNOSIS — I10 Essential (primary) hypertension: Secondary | ICD-10-CM

## 2012-04-14 DIAGNOSIS — I509 Heart failure, unspecified: Secondary | ICD-10-CM

## 2012-04-14 DIAGNOSIS — E785 Hyperlipidemia, unspecified: Secondary | ICD-10-CM

## 2012-04-14 DIAGNOSIS — E669 Obesity, unspecified: Secondary | ICD-10-CM

## 2012-04-14 LAB — LIPID PANEL
Cholesterol: 192 mg/dL (ref 0–200)
HDL: 44 mg/dL (ref >39)
Total CHOL/HDL Ratio: 4.4 Ratio
Triglycerides: 504 mg/dL — ABNORMAL HIGH (ref <150)

## 2012-04-14 LAB — HEMOGLOBIN A1C
Hgb A1c MFr Bld: 9.1 % — ABNORMAL HIGH
Mean Plasma Glucose: 214 mg/dL — ABNORMAL HIGH

## 2012-04-14 LAB — COMPREHENSIVE METABOLIC PANEL
CO2: 27 mEq/L (ref 19–32)
Calcium: 8.9 mg/dL (ref 8.4–10.5)
Glucose, Bld: 295 mg/dL — ABNORMAL HIGH (ref 70–99)
Sodium: 139 mEq/L (ref 135–145)
Total Bilirubin: 0.7 mg/dL (ref 0.3–1.2)
Total Protein: 6.6 g/dL (ref 6.0–8.3)

## 2012-04-14 LAB — CBC
MCV: 94.6 fL (ref 78.0–100.0)
Platelets: 201 10*3/uL (ref 150–400)
RBC: 4.23 MIL/uL (ref 3.87–5.11)
WBC: 6.9 10*3/uL (ref 4.0–10.5)

## 2012-04-14 MED ORDER — ROSUVASTATIN CALCIUM 40 MG PO TABS: 40.0000 mg | ORAL_TABLET | Freq: Every day | ORAL | Status: DC

## 2012-04-14 MED ORDER — PAROXETINE HCL 40 MG PO TABS: 40.0000 mg | ORAL_TABLET | ORAL | Status: DC

## 2012-04-14 MED ORDER — INSULIN LISPRO 100 UNIT/ML ~~LOC~~ SOLN: 15.0000 [IU] | Freq: Three times a day (TID) | SUBCUTANEOUS | Status: DC

## 2012-04-14 MED ORDER — INSULIN GLARGINE 100 UNIT/ML ~~LOC~~ SOLN: 37.0000 [IU] | Freq: Two times a day (BID) | SUBCUTANEOUS | Status: DC

## 2012-04-14 NOTE — Patient Instructions (Addendum)
Continue current medications Get labs done today  We will call with instructions F/U 3 months

## 2012-04-16 ENCOUNTER — Encounter: Payer: Self-pay | Admitting: Family Medicine

## 2012-04-16 NOTE — Progress Notes (Signed)
  Subjective:    Patient ID: MARJANI KOBEL, female    DOB: [birth year], 61 y.o.   MRN: [id number]  HPI  Pt here to f/u DM and blood pressure. She has not been taking her BS recently, she has been out of lantus for 3 weeks, she has been taking care of her sister who was recently hospitalized  Still taking novolog 15 units with meals  CHF- breathing has been good, no swelling Review of Systems  GEN- denies fatigue, fever, weight loss,weakness, recent illness HEENT- denies eye drainage, change in vision, nasal discharge, CVS- denies chest pain, palpitations RESP- denies SOB, cough, wheeze ABD- denies N/V, change in stools, abd pain GU- denies dysuria, hematuria, dribbling, incontinence MSK- denies joint pain, muscle aches, injury Neuro- denies headache, dizziness, syncope, seizure activity      Objective:   Physical Exam GEN- NAD, alert and oriented x3 HEENT- PERRL, EOMI, non injected sclera, pink conjunctiva, MMM, oropharynx clear Neck- Supple,  CVS- RRR, no murmur RESP-CTAB EXT- No edema Pulses- Radial, DP- 2+        Assessment & Plan:

## 2012-04-16 NOTE — Assessment & Plan Note (Signed)
Check A1C and will titrate meds from there, expected further uncontrolled status On ARB On statin

## 2012-04-16 NOTE — Assessment & Plan Note (Signed)
BP elevated some today, meds changed by cardiology, no changes today

## 2012-04-16 NOTE — Assessment & Plan Note (Signed)
Unchanged, poor exercise tolerance

## 2012-04-16 NOTE — Assessment & Plan Note (Signed)
Doing well, on diuretic therapy, weights have been stable

## 2012-04-16 NOTE — Assessment & Plan Note (Signed)
Check FLP 

## 2012-04-17 MED ORDER — INSULIN GLARGINE 100 UNIT/ML ~~LOC~~ SOLN: 45.0000 [IU] | Freq: Two times a day (BID) | SUBCUTANEOUS | Status: DC

## 2012-04-17 NOTE — Addendum Note (Signed)
Addended by: Milinda Antis F on: 04/17/2012 10:39 PM   Modules accepted: Orders

## 2012-05-18 ENCOUNTER — Other Ambulatory Visit: Payer: Self-pay | Admitting: Family Medicine

## 2012-05-18 MED ORDER — GLIPIZIDE-METFORMIN HCL 5-500 MG PO TABS: 1.0000 | ORAL_TABLET | Freq: Two times a day (BID) | ORAL | Status: DC

## 2012-05-19 NOTE — Progress Notes (Signed)
Called patient and left message for them to return call at the office   

## 2012-05-30 NOTE — Progress Notes (Signed)
Patient aware and states she will be calling back soon with her new mail order company and meds to be sent in

## 2012-06-05 ENCOUNTER — Telehealth: Payer: Self-pay | Admitting: Family Medicine

## 2012-06-09 NOTE — Telephone Encounter (Signed)
Formed completed for pa for diabetic supplies.  Will refax rx to [facility] for testing supplies.

## 2012-06-09 NOTE — Telephone Encounter (Signed)
rx faxed

## 2012-07-13 ENCOUNTER — Other Ambulatory Visit: Payer: Self-pay | Admitting: Family Medicine

## 2012-07-14 ENCOUNTER — Ambulatory Visit: Payer: Medicare Other | Admitting: Family Medicine

## 2012-07-20 ENCOUNTER — Ambulatory Visit (INDEPENDENT_AMBULATORY_CARE_PROVIDER_SITE_OTHER): Payer: Medicare Other | Admitting: Family Medicine

## 2012-07-20 ENCOUNTER — Encounter: Payer: Self-pay | Admitting: Family Medicine

## 2012-07-20 ENCOUNTER — Other Ambulatory Visit: Payer: Self-pay | Admitting: Family Medicine

## 2012-07-20 VITALS — BP 124/80 | HR 67 | Resp 18 | Ht 60.0 in | Wt 193.0 lb

## 2012-07-20 DIAGNOSIS — L270 Generalized skin eruption due to drugs and medicaments taken internally: Secondary | ICD-10-CM

## 2012-07-20 DIAGNOSIS — I509 Heart failure, unspecified: Secondary | ICD-10-CM

## 2012-07-20 DIAGNOSIS — I10 Essential (primary) hypertension: Secondary | ICD-10-CM

## 2012-07-20 DIAGNOSIS — G4700 Insomnia, unspecified: Secondary | ICD-10-CM

## 2012-07-20 DIAGNOSIS — G4733 Obstructive sleep apnea (adult) (pediatric): Secondary | ICD-10-CM

## 2012-07-20 DIAGNOSIS — E119 Type 2 diabetes mellitus without complications: Secondary | ICD-10-CM

## 2012-07-20 DIAGNOSIS — E785 Hyperlipidemia, unspecified: Secondary | ICD-10-CM

## 2012-07-20 DIAGNOSIS — IMO0001 Reserved for inherently not codable concepts without codable children: Secondary | ICD-10-CM

## 2012-07-20 MED ORDER — AMITRIPTYLINE HCL 25 MG PO TABS: ORAL_TABLET | ORAL | Status: DC

## 2012-07-20 NOTE — Patient Instructions (Addendum)
Increase lantus to 45 units twice a day Start elavil 1-2 tablets at bedtime Get the labs done fasting Use the antibiotic ointment on behind your ear and on forehead F/U 4 weeks, bring your meter

## 2012-07-20 NOTE — Progress Notes (Signed)
  Subjective:    Patient ID: Tina [name], female    DOB: 11/21/1950, 62 y.o.   MRN: [id number]  HPI Patient here to follow chronic medical problems including diabetes and hypertension. She does her blood sugars have been in the 300s she did not increase her Lantus to 45 units twice a day states that she did not write it down she forgets to take Humalog often before meals and will take it during the middle afterwards. No hypoglycemia. She started the Lyrica for her fibromyalgia and neuropathy however a few days into taking them medication she noticed blistering rash in her scalp and behind her ear is therefore she stopped this 3 weeks ago. The rash has improved some but she still has some scabs that are a little tender.  Hypertriglyceridemia her triglycerides are noted to be 500 she is currently taking 3000 mg official twice a day as well as her Crestor   Review of Systems  GEN- denies fatigue, fever, weight loss,weakness, recent illness HEENT- denies eye drainage, change in vision, nasal discharge, CVS- denies chest pain, palpitations RESP- denies SOB, cough, wheeze ABD- denies N/V, change in stools, abd pain GU- denies dysuria, hematuria, dribbling, incontinence MSK- denies joint pain,+ muscle aches, injury Neuro- denies headache, dizziness, syncope, seizure activity      Objective:   Physical Exam   GEN- NAD, alert and oriented x3 HEENT- PERRL, EOMI, non injected sclera, pink conjunctiva, MMM, oropharynx clear Neck- Supple,  CVS- RRR, no murmur RESP-CTAB EXT- No edema, no open lesions Pulses- Radial, DP- 2+ Skin- small healing blister left forehead and left posterior auricular, TTP, few lesions with erythema surrounding, no drainage, scalp clear       Assessment & Plan:

## 2012-07-21 DIAGNOSIS — L270 Generalized skin eruption due to drugs and medicaments taken internally: Secondary | ICD-10-CM | POA: Insufficient documentation

## 2012-07-21 DIAGNOSIS — G4733 Obstructive sleep apnea (adult) (pediatric): Secondary | ICD-10-CM | POA: Insufficient documentation

## 2012-07-21 NOTE — Assessment & Plan Note (Signed)
She has signs and symptoms of obstructive sleep apnea. She does tell me that even though she is unable to sleep and she does dose off she wakes up feeling like she has to catch her breath and change snores loudly. I will have her set up for a sleep study

## 2012-07-21 NOTE — Assessment & Plan Note (Signed)
Likely due to the Lyrica this is simply some allergy list I will have her use a little triple antibiotic cream on 2 of the lesions which have some mild redness surrounding

## 2012-07-21 NOTE — Assessment & Plan Note (Signed)
I will try Elavil for both her fibromyalgia and her insomnia

## 2012-07-21 NOTE — Assessment & Plan Note (Signed)
She's currently stable fluid-wise she is on her Lasix 40 mg daily. Advise her not to use anti-inflammatories for her pain at this may exacerbate her CHF

## 2012-07-21 NOTE — Assessment & Plan Note (Signed)
We'll try Elavil

## 2012-07-21 NOTE — Assessment & Plan Note (Signed)
Well controlled 

## 2012-07-21 NOTE — Assessment & Plan Note (Signed)
Diabetes is uncontrolled our have her obtain another A1c. Depending on results she may need referral to endocrinology. Increase Lantus to 45 units twice a day. Continue meal coverage

## 2012-08-02 ENCOUNTER — Other Ambulatory Visit: Payer: Self-pay

## 2012-08-02 MED ORDER — LOSARTAN POTASSIUM 25 MG PO TABS: ORAL_TABLET | ORAL | Status: DC

## 2012-08-17 ENCOUNTER — Ambulatory Visit (INDEPENDENT_AMBULATORY_CARE_PROVIDER_SITE_OTHER): Payer: Medicare Other | Admitting: Family Medicine

## 2012-08-17 ENCOUNTER — Encounter: Payer: Self-pay | Admitting: Family Medicine

## 2012-08-17 VITALS — BP 128/70 | HR 63 | Resp 18 | Ht 60.0 in | Wt 194.1 lb

## 2012-08-17 DIAGNOSIS — F32A Depression, unspecified: Secondary | ICD-10-CM

## 2012-08-17 DIAGNOSIS — IMO0001 Reserved for inherently not codable concepts without codable children: Secondary | ICD-10-CM

## 2012-08-17 DIAGNOSIS — E119 Type 2 diabetes mellitus without complications: Secondary | ICD-10-CM

## 2012-08-17 DIAGNOSIS — F329 Major depressive disorder, single episode, unspecified: Secondary | ICD-10-CM

## 2012-08-17 DIAGNOSIS — G4700 Insomnia, unspecified: Secondary | ICD-10-CM

## 2012-08-17 DIAGNOSIS — F3289 Other specified depressive episodes: Secondary | ICD-10-CM

## 2012-08-17 MED ORDER — HYDROCODONE-ACETAMINOPHEN 5-325 MG PO TABS: 1.0000 | ORAL_TABLET | Freq: Every day | ORAL | Status: DC | PRN

## 2012-08-17 NOTE — Patient Instructions (Addendum)
Increase Lantus to 50 units twice a day Novolog to 20 units with each meal  Start the hydrocodone-1 tablet a day Increase amitriptyline to 2 tablets at bedtime F/U 4 weeks

## 2012-08-17 NOTE — Progress Notes (Signed)
  Subjective:    Patient ID: Tina [name], female    DOB: 1950-08-09, 62 y.o.   MRN: [id number]  HPI  Pt presents with increased stress over past couple of weeks, daughter is on drugs and left the home, she finds her self very anxious and stressed out, this makes her fibromyalgia unbearable, She hurts all day, with no relief, amitryptiline helps at bedtime mostly with sleep she is taking 1 tablet. DM- blood sugars are 200-300, no hypoglycemia  Review of Systems   GEN- denies fatigue, fever, weight loss,weakness, recent illness HEENT- denies eye drainage, change in vision, nasal discharge, CVS- denies chest pain, palpitations RESP- denies SOB, cough, wheeze MSK- + joint pain, +muscle aches, injury Neuro- denies headache, dizziness, syncope, seizure activity       Objective:   Physical Exam GEN- NAD, alert and oriented x3 CVS- RRR, no murmur RESP-CTAB EXT- No edema Pulses- Radial, DP- 2+ Psych- not overly anxious, depressed affect, no SI, no hallucinations       Assessment & Plan:

## 2012-08-20 NOTE — Assessment & Plan Note (Signed)
deteriorated some with recent stressors, will continue SSRI, increase TCA at bedtime, consider adding wellbutrin

## 2012-08-20 NOTE — Assessment & Plan Note (Signed)
Will start her on hydrocodone 1 tablet daily, amitryptiline 50mg  at night, she can not tolerate during the day due to sedation Also on SSRI Failed lyrica, gabapentin

## 2012-08-20 NOTE — Assessment & Plan Note (Signed)
improved

## 2012-08-20 NOTE — Assessment & Plan Note (Signed)
Lantus 50 units BID, novolog 20 units QAC, if we can not improve her CBG will need to get her to endo, though right now she can not afford this

## 2012-08-31 ENCOUNTER — Other Ambulatory Visit: Payer: Self-pay | Admitting: Family Medicine

## 2012-09-05 ENCOUNTER — Ambulatory Visit: Payer: Medicare Other | Admitting: Family Medicine

## 2012-09-14 ENCOUNTER — Ambulatory Visit (INDEPENDENT_AMBULATORY_CARE_PROVIDER_SITE_OTHER): Payer: Medicare Other | Admitting: Family Medicine

## 2012-09-14 VITALS — BP 130/86 | HR 60 | Resp 18 | Ht 60.0 in | Wt 195.0 lb

## 2012-09-14 DIAGNOSIS — E119 Type 2 diabetes mellitus without complications: Secondary | ICD-10-CM

## 2012-09-14 DIAGNOSIS — M25569 Pain in unspecified knee: Secondary | ICD-10-CM

## 2012-09-14 DIAGNOSIS — G4700 Insomnia, unspecified: Secondary | ICD-10-CM

## 2012-09-14 DIAGNOSIS — F3289 Other specified depressive episodes: Secondary | ICD-10-CM

## 2012-09-14 DIAGNOSIS — G4733 Obstructive sleep apnea (adult) (pediatric): Secondary | ICD-10-CM

## 2012-09-14 DIAGNOSIS — F32A Depression, unspecified: Secondary | ICD-10-CM

## 2012-09-14 DIAGNOSIS — F329 Major depressive disorder, single episode, unspecified: Secondary | ICD-10-CM

## 2012-09-14 DIAGNOSIS — M25562 Pain in left knee: Secondary | ICD-10-CM

## 2012-09-14 DIAGNOSIS — R21 Rash and other nonspecific skin eruption: Secondary | ICD-10-CM

## 2012-09-14 DIAGNOSIS — F411 Generalized anxiety disorder: Secondary | ICD-10-CM

## 2012-09-14 LAB — HEMOGLOBIN A1C: Mean Plasma Glucose: 217 mg/dL — ABNORMAL HIGH (ref <117)

## 2012-09-14 MED ORDER — BUSPIRONE HCL 15 MG PO TABS: 15.0000 mg | ORAL_TABLET | Freq: Two times a day (BID) | ORAL | Status: DC

## 2012-09-14 MED ORDER — DOXYCYCLINE HYCLATE 100 MG PO TABS: 100.0000 mg | ORAL_TABLET | Freq: Two times a day (BID) | ORAL | Status: AC

## 2012-09-14 NOTE — Patient Instructions (Addendum)
Antibiotics for face Take the new anxiety medication buspar to help with our prozac Get the xray of your leg and knee Continue pain medication as needed Continue current lantus We will call with results of labs and your xray Stop the amitryptiline F/U 2 weeks

## 2012-09-15 LAB — COMPREHENSIVE METABOLIC PANEL
ALT: 35 U/L (ref 0–35)
Albumin: 3.9 g/dL (ref 3.5–5.2)
CO2: 27 mEq/L (ref 19–32)
Calcium: 9.2 mg/dL (ref 8.4–10.5)
Chloride: 102 mEq/L (ref 96–112)
Sodium: 139 mEq/L (ref 135–145)
Total Protein: 6.7 g/dL (ref 6.0–8.3)

## 2012-09-15 LAB — LIPID PANEL
Cholesterol: 186 mg/dL (ref 0–200)
Triglycerides: 349 mg/dL — ABNORMAL HIGH (ref <150)

## 2012-09-15 LAB — MICROALBUMIN / CREATININE URINE RATIO: Microalb, Ur: 2.68 mg/dL — ABNORMAL HIGH (ref 0.00–1.89)

## 2012-09-17 ENCOUNTER — Encounter: Payer: Self-pay | Admitting: Family Medicine

## 2012-09-17 DIAGNOSIS — R21 Rash and other nonspecific skin eruption: Secondary | ICD-10-CM | POA: Insufficient documentation

## 2012-09-17 DIAGNOSIS — F411 Generalized anxiety disorder: Secondary | ICD-10-CM | POA: Insufficient documentation

## 2012-09-17 DIAGNOSIS — M25569 Pain in unspecified knee: Secondary | ICD-10-CM | POA: Insufficient documentation

## 2012-09-17 NOTE — Assessment & Plan Note (Signed)
Infected lesions from her picking at skin, difficult to say if true rash or her picking at face, antibiotics

## 2012-09-17 NOTE — Progress Notes (Signed)
  Subjective:    Patient ID: Tina [name], female    DOB: 23-Feb-1951, 62 y.o.   MRN: [id number]  HPI  Pt is here today with a family friend with multiple concerns, may of which she did tell me at the last visit, states she forgets. 1. DM- wakes up late and forgets to take her insulin many times, states she can't get out of bed 2. Insomnia, and depression- last visit stated elavil helped, this was increased because of also use for neuropathy now states it wasn't helping and was making her extra tired and she had dry mouth so she wasn't taking on regular basis. Feels depressed and anxious, most of times feels anxious , her friend states her nerves are bad and she never relaxes 3. Rash thought to be due to lyrica is back past couple of weeks, she has been picking at skin, noticed redness and pus 4. Left shin pain for past 4 weeks, getting worse, painful bearing weight, no swelling no injury, uses hydrocodone for severe pain 5. Gets short of breat at night, no chest pain, no increase in swelling Had labs drawn today    Review of Systems  GEN- denies fatigue, fever, weight loss,weakness, recent illness HEENT- denies eye drainage, change in vision, nasal discharge, CVS- denies chest pain, palpitations RESP- + SOB, denies cough, wheeze ABD- denies N/V, change in stools, abd pain GU- denies dysuria, hematuria, dribbling, incontinence MSK- + joint pain, muscle aches, injury Neuro- denies headache, dizziness, syncope, seizure activity      Objective:   Physical Exam GEN- NAD, alert and oriented x3 HEENT- PERRL, EOMI, non injected sclera, pink conjunctiva, MMM, oropharynx clear Neck- Supple,  CVS- RRR, no murmur RESP-CTAB EXT- No edema, TTP over mid left shin, no erythema MSK- good ROM bilateral knees, no swelling, normal inspection, pain with weight bearing on left leg  Pulses- Radial, DP- 2+ Psych- depressed, a little flustered appearing, no SI Skin- multiple scabs some with erythema  few pustules, excoriations on chin       Assessment & Plan:

## 2012-09-17 NOTE — Assessment & Plan Note (Signed)
Depression and anxiety, add buspar to augment SSRI Continue switching her to effexor

## 2012-09-17 NOTE — Assessment & Plan Note (Signed)
I think this is likley Sleep apnea with her symptoms, unable to get into neurology, no decompensation of CHF May need over-nite oxygen study as well

## 2012-09-17 NOTE — Assessment & Plan Note (Signed)
Avoid benzo Add buspar

## 2012-09-17 NOTE — Assessment & Plan Note (Signed)
Differential referred pain from knee- OA, shin pain due to stress fracture, or fibromyalgia Will obtain xray of leg and knee as she has a lot of difficulty bearing weight due to pain

## 2012-09-17 NOTE — Assessment & Plan Note (Signed)
Her care is quite complex, I am not sure why she would lie about medications in the previous visit, states she did not remember, May need to get psychiatry involved in her care D/C elavil, has tried trazodone, [place]

## 2012-09-26 ENCOUNTER — Other Ambulatory Visit: Payer: Self-pay | Admitting: Family Medicine

## 2012-09-28 ENCOUNTER — Ambulatory Visit: Payer: Medicare Other | Admitting: Family Medicine

## 2012-10-09 ENCOUNTER — Encounter: Payer: Self-pay | Admitting: Family Medicine

## 2012-10-09 ENCOUNTER — Ambulatory Visit (INDEPENDENT_AMBULATORY_CARE_PROVIDER_SITE_OTHER): Payer: Medicare Other | Admitting: Family Medicine

## 2012-10-09 VITALS — BP 150/92 | HR 55 | Resp 16 | Wt 193.0 lb

## 2012-10-09 DIAGNOSIS — F3289 Other specified depressive episodes: Secondary | ICD-10-CM

## 2012-10-09 DIAGNOSIS — E785 Hyperlipidemia, unspecified: Secondary | ICD-10-CM

## 2012-10-09 DIAGNOSIS — E119 Type 2 diabetes mellitus without complications: Secondary | ICD-10-CM

## 2012-10-09 DIAGNOSIS — F32A Depression, unspecified: Secondary | ICD-10-CM

## 2012-10-09 DIAGNOSIS — F329 Major depressive disorder, single episode, unspecified: Secondary | ICD-10-CM

## 2012-10-09 DIAGNOSIS — F411 Generalized anxiety disorder: Secondary | ICD-10-CM

## 2012-10-09 DIAGNOSIS — I10 Essential (primary) hypertension: Secondary | ICD-10-CM

## 2012-10-09 MED ORDER — OMEGA-3 FATTY ACIDS 1000 MG PO CAPS: 2.0000 g | ORAL_CAPSULE | Freq: Two times a day (BID) | ORAL | Status: DC

## 2012-10-09 MED ORDER — SIMVASTATIN 80 MG PO TABS: 80.0000 mg | ORAL_TABLET | Freq: Every evening | ORAL | Status: DC

## 2012-10-09 NOTE — Patient Instructions (Addendum)
Take blood sugar four times a day - Fasting, then before your meals  Humalog 15 units  Which each meal breakfast lunch and dinner Lantus 50 units in morning - first thing  Lantus 40 Units at bedtime  Continue buspar Stop the crestor  Continue fish oil  Start zocor- much cheaper  Stop the metformin/Glipizide F/U 2 months [employer] - For diabetes - 1ST WEEK OF Gwen[facility] Perking

## 2012-10-10 ENCOUNTER — Encounter: Payer: Self-pay | Admitting: Family Medicine

## 2012-10-10 NOTE — Assessment & Plan Note (Signed)
Improved on buspar, continue paxil

## 2012-10-10 NOTE — Assessment & Plan Note (Signed)
Will need cheaper statin , will try zocor, continue fish oil

## 2012-10-10 NOTE — Assessment & Plan Note (Signed)
Repeat BP normal, no change to meds

## 2012-10-10 NOTE — Progress Notes (Signed)
  Subjective:    Patient ID: Tina [name], female    DOB: 28-Jun-1950, 62 y.o.   MRN: [id number]  HPI  Pt here to f/u mood in intermin and DM States she feels much better on buspar, does not feel anxious, sleeping better. DM- does not know when to take her insulin shots. Though she was to give both novolog and lantus at bedtime, was afraid to take when her sugar was below 200. Has been trying to get up early and eat breakfast. Typically takes Lantus BID and novolog sometimes, last A1C 9.2 % Has record of her CBG at various times, fasting 140-296, evening 160-328 Unable to afford crestor now in donut hole Leg pain is somewhat better, did not get xray  Review of Systems  GEN- denies fatigue, fever, weight loss,weakness, recent illness HEENT- denies eye drainage, change in vision, nasal discharge, CVS- denies chest pain, palpitations RESP- denies SOB, cough, wheeze Neuro- denies headache, dizziness, syncope, seizure activity      Objective:   Physical Exam GEN-NAD,alert and oriented x3 , repeat BP 128/80 CVS-RRR, no murmur RESP-CTAB EXT- no edema Psych- smiling more, not anxious appearing today       Assessment & Plan:

## 2012-10-10 NOTE — Assessment & Plan Note (Signed)
Repeat A1C is unchanged, still uncontrolled She can not afford to go to endocrine though she would be better served D/C metformin/glipizide not contributing at this time and CHF history With her fluctuating sugars will decrease lantus to 50 units AM, 40 units PM She will drop down to 15 units with each meal, discussed when to take her insulin and not to skip doses It is hard to control her as she takes insulin at various times, skips doses as well

## 2012-10-10 NOTE — Assessment & Plan Note (Signed)
Improved with addition of buspar

## 2012-10-26 ENCOUNTER — Other Ambulatory Visit: Payer: Self-pay | Admitting: Family Medicine

## 2012-10-26 NOTE — Telephone Encounter (Signed)
Medication denied,  Discontinued at last office visit

## 2012-10-27 ENCOUNTER — Other Ambulatory Visit: Payer: Self-pay | Admitting: Family Medicine

## 2012-11-23 ENCOUNTER — Other Ambulatory Visit: Payer: Self-pay | Admitting: Family Medicine

## 2012-11-23 NOTE — Telephone Encounter (Signed)
meds refilled 

## 2012-12-11 ENCOUNTER — Ambulatory Visit (INDEPENDENT_AMBULATORY_CARE_PROVIDER_SITE_OTHER): Payer: Medicare Other | Admitting: Family Medicine

## 2012-12-11 VITALS — BP 120/70 | HR 60 | Temp 97.5°F | Resp 16 | Wt 193.0 lb

## 2012-12-11 DIAGNOSIS — E119 Type 2 diabetes mellitus without complications: Secondary | ICD-10-CM

## 2012-12-11 DIAGNOSIS — G4733 Obstructive sleep apnea (adult) (pediatric): Secondary | ICD-10-CM

## 2012-12-11 DIAGNOSIS — I10 Essential (primary) hypertension: Secondary | ICD-10-CM

## 2012-12-11 DIAGNOSIS — E785 Hyperlipidemia, unspecified: Secondary | ICD-10-CM

## 2012-12-11 MED ORDER — PAROXETINE HCL 40 MG PO TABS: ORAL_TABLET | ORAL | Status: DC

## 2012-12-11 MED ORDER — BUSPIRONE HCL 15 MG PO TABS: ORAL_TABLET | ORAL | Status: DC

## 2012-12-11 MED ORDER — HYDROCODONE-ACETAMINOPHEN 5-325 MG PO TABS: 1.0000 | ORAL_TABLET | Freq: Every day | ORAL | Status: DC | PRN

## 2012-12-11 NOTE — Patient Instructions (Addendum)
[facility] Referral for sleep study  Continue Lantus 50 units twice a day Humalog 20 units three times a day F/U 3 months

## 2012-12-12 ENCOUNTER — Encounter: Payer: Self-pay | Admitting: Family Medicine

## 2012-12-12 NOTE — Assessment & Plan Note (Signed)
Well controlled 

## 2012-12-12 NOTE — Assessment & Plan Note (Signed)
Diabetes has been uncontrolled. I will obtain another A1c. Her Lantus will be 50 units she will continue NovoLog 20 units with each meal Keeping her from missing doses of insulin is very difficult. Closely with [facility] she will keep her medications on a regular basis

## 2012-12-12 NOTE — Progress Notes (Signed)
  Subjective:    Patient ID: Tina [name], female    DOB: 01-Sep-1950, 61 y.o.   MRN: [id number]  HPI Patient here to follow chronic medical problems. She's here to followup her diabetes specifically. She was out of her Lantus for a week or so. Her fastings have been 2 2250. She's not been able to afford her medication secondary to being in the donut hole. She went back to taking Lantus 50 units and 20 units with each meal her CBG yesterday was 120. Denies hypoglycemia, polyuria, polydipsia  She is ready to follow with neurology to have her sleep study done she continues to have difficulty at nighttime feels like her breathing stops.    Review of Systems   GEN- denies fatigue, fever, weight loss,weakness, recent illness HEENT- denies eye drainage, change in vision, nasal discharge, CVS- denies chest pain, palpitations RESP- + SOB, cough, wheeze ABD- denies N/V, change in stools, abd pain Neuro- denies headache, dizziness, syncope, seizure activity      Objective:   Physical Exam GEN- NAD, alert and oriented x3 HEENT- PERRL, EOMI, non injected sclera, pink conjunctiva, MMM, oropharynx clear CVS- RRR, no murmur RESP-CTAB EXT- No edema Pulses- Radial, DP- 2+        Assessment & Plan:

## 2012-12-12 NOTE — Assessment & Plan Note (Signed)
Her cholesterol is uncontrolled. Her triglycerides are high. Like for her to return to the use of Crestor she is going to be with [facility] we'll restart her back and her 40 mg. Currently she is on the simvastatin 80 mg an interim as she cannot afford to Crestor

## 2012-12-12 NOTE — Assessment & Plan Note (Signed)
I will refer to neurology for her to have a sleep study done she's able to have this done next month

## 2013-01-01 ENCOUNTER — Telehealth: Payer: Self-pay | Admitting: Family Medicine

## 2013-01-01 NOTE — Telephone Encounter (Signed)
No answer at this time

## 2013-01-01 NOTE — Telephone Encounter (Signed)
[facility], please check on this  See if we have any insulin here she can have as well s

## 2013-01-02 ENCOUNTER — Other Ambulatory Visit: Payer: Self-pay | Admitting: Family Medicine

## 2013-01-02 MED ORDER — LOSARTAN POTASSIUM 25 MG PO TABS: ORAL_TABLET | ORAL | Status: DC

## 2013-01-02 MED ORDER — PAROXETINE HCL 40 MG PO TABS: ORAL_TABLET | ORAL | Status: DC

## 2013-01-02 MED ORDER — ROSUVASTATIN CALCIUM 40 MG PO TABS: 40.0000 mg | ORAL_TABLET | Freq: Every day | ORAL | Status: DC

## 2013-01-02 MED ORDER — SIMVASTATIN 80 MG PO TABS: 80.0000 mg | ORAL_TABLET | Freq: Every evening | ORAL | Status: DC

## 2013-01-02 MED ORDER — BUSPIRONE HCL 15 MG PO TABS: ORAL_TABLET | ORAL | Status: DC

## 2013-01-02 MED ORDER — INSULIN LISPRO 100 UNIT/ML (KWIKPEN): 20.0000 [IU] | PEN_INJECTOR | Freq: Three times a day (TID) | SUBCUTANEOUS | Status: DC

## 2013-01-02 MED ORDER — INSULIN PEN NEEDLE 31G X 5 MM MISC: Status: DC

## 2013-01-02 MED ORDER — INSULIN GLARGINE 100 UNIT/ML ~~LOC~~ SOLN: 50.0000 [IU] | Freq: Every day | SUBCUTANEOUS | Status: DC

## 2013-01-02 MED ORDER — FUROSEMIDE 40 MG PO TABS: ORAL_TABLET | ORAL | Status: DC

## 2013-01-02 MED ORDER — OMEPRAZOLE 20 MG PO CPDR: DELAYED_RELEASE_CAPSULE | ORAL | Status: DC

## 2013-01-02 MED ORDER — ASPIRIN 81 MG PO TABS: 81.0000 mg | ORAL_TABLET | ORAL | Status: DC

## 2013-01-02 MED ORDER — METOPROLOL TARTRATE 100 MG PO TABS: ORAL_TABLET | ORAL | Status: DC

## 2013-01-02 NOTE — Telephone Encounter (Signed)
Med list faxed over to [facility] Alecia Lemming

## 2013-01-02 NOTE — Telephone Encounter (Signed)
Pt was referred to [facility], also SS in [facility] is working on trying to help pt

## 2013-01-02 NOTE — Telephone Encounter (Signed)
Pt called back and she has stated that she is in the donut hole with her medications lantus and novolog and wants to know if we can send over a fax to Alecia Lemming over at [employer] and Ms. Adline Potter may be able to help. I told her I would send over list but also let her know that we had samples of Lantus and Novolog her at office that Icould give her until Ms. Adline Potter can help. Pt is going to see if someone would bring her to pick up samples

## 2013-01-02 NOTE — Telephone Encounter (Signed)
All meds were filled and printed for Tina [name] request from [facility] to help pt with medication

## 2013-01-02 NOTE — Telephone Encounter (Signed)
LMTRC

## 2013-01-18 ENCOUNTER — Telehealth: Payer: Self-pay | Admitting: Family Medicine

## 2013-01-19 ENCOUNTER — Telehealth: Payer: Self-pay | Admitting: Family Medicine

## 2013-01-19 MED ORDER — FLUCONAZOLE 150 MG PO TABS: 150.0000 mg | ORAL_TABLET | Freq: Once | ORAL | Status: DC

## 2013-01-19 NOTE — Telephone Encounter (Signed)
Pt has a yeast infection. She said she thinks it is from where she has been running out of her insulin and his sugar has been "crazy". Can you send something in for her yeast infection?

## 2013-01-19 NOTE — Telephone Encounter (Signed)
Meds refilled.

## 2013-01-19 NOTE — Telephone Encounter (Signed)
Call in Diflucan 150mg  x 1, repeat in 3 days, # 2 tablet, with 1 refill

## 2013-01-23 NOTE — Telephone Encounter (Signed)
error 

## 2013-03-07 ENCOUNTER — Telehealth: Payer: Self-pay | Admitting: Family Medicine

## 2013-03-07 NOTE — Telephone Encounter (Signed)
Please call her -she is having a problem with her insulin cost wise

## 2013-03-07 NOTE — Telephone Encounter (Signed)
Called pt- voicemail not set up

## 2013-03-13 ENCOUNTER — Ambulatory Visit: Payer: Medicare Other | Admitting: Family Medicine

## 2013-03-14 NOTE — Telephone Encounter (Signed)
Left message with pt to return my call °

## 2013-03-15 NOTE — Telephone Encounter (Signed)
LMTRC

## 2013-03-16 NOTE — Telephone Encounter (Signed)
Called pt and she stated the insulin is costing her 300 dollars a month and wanted to know if there is anything cheaper for her take. I called her pharmacy to see if there was anything cheaper and they stated that they all run about the same amount. Pt is aware.

## 2013-03-21 ENCOUNTER — Other Ambulatory Visit: Payer: Self-pay | Admitting: Family Medicine

## 2013-03-21 DIAGNOSIS — E785 Hyperlipidemia, unspecified: Secondary | ICD-10-CM

## 2013-03-21 DIAGNOSIS — IMO0001 Reserved for inherently not codable concepts without codable children: Secondary | ICD-10-CM

## 2013-03-22 LAB — COMPREHENSIVE METABOLIC PANEL
AST: 14 U/L (ref 0–37)
Alkaline Phosphatase: 75 U/L (ref 39–117)
BUN: 16 mg/dL (ref 6–23)
Calcium: 9.1 mg/dL (ref 8.4–10.5)
Chloride: 102 mEq/L (ref 96–112)
Creat: 0.76 mg/dL (ref 0.50–1.10)
Glucose, Bld: 300 mg/dL — ABNORMAL HIGH (ref 70–99)
Total Bilirubin: 0.6 mg/dL (ref 0.3–1.2)

## 2013-03-22 LAB — HEMOGLOBIN A1C: Hgb A1c MFr Bld: 10 % — ABNORMAL HIGH (ref <5.7)

## 2013-03-22 LAB — LIPID PANEL
HDL: 42 mg/dL (ref >39)
Total CHOL/HDL Ratio: 8.6 Ratio

## 2013-03-22 LAB — CBC
HCT: 41.6 % (ref 36.0–46.0)
Hemoglobin: 14.5 g/dL (ref 12.0–15.0)
MCH: 33.3 pg (ref 26.0–34.0)
MCHC: 34.9 g/dL (ref 30.0–36.0)
MCV: 95.6 fL (ref 78.0–100.0)
Platelets: 228 10*3/uL (ref 150–400)
RDW: 14.2 % (ref 11.5–15.5)
WBC: 5.9 10*3/uL (ref 4.0–10.5)

## 2013-03-26 ENCOUNTER — Encounter: Payer: Self-pay | Admitting: Family Medicine

## 2013-03-26 ENCOUNTER — Ambulatory Visit (INDEPENDENT_AMBULATORY_CARE_PROVIDER_SITE_OTHER): Payer: BC Managed Care – PPO | Admitting: Family Medicine

## 2013-03-26 VITALS — BP 110/60 | HR 68 | Temp 98.1°F | Resp 18 | Ht <= 58 in | Wt 192.0 lb

## 2013-03-26 DIAGNOSIS — E785 Hyperlipidemia, unspecified: Secondary | ICD-10-CM

## 2013-03-26 DIAGNOSIS — E119 Type 2 diabetes mellitus without complications: Secondary | ICD-10-CM

## 2013-03-26 DIAGNOSIS — I10 Essential (primary) hypertension: Secondary | ICD-10-CM

## 2013-03-26 NOTE — Patient Instructions (Signed)
Take Crestor 20mg  once a day  Take Levemir 10units at bedtime Janument 1 tablet twice a day with meal Bring your meter  F/U 4 weeks

## 2013-03-26 NOTE — Assessment & Plan Note (Signed)
Uncontrolled due to inability to keep medications/insulin  I have levemir in the office, will have her take 10 units QHS Janumet 50-500mg  BID Continue humalog with meals  I have already signed off on medications from the health department so I am not sure why she does not have insulin from the program She will start the above medications in the meantime to carry her over, all of the above were samples, these will at least help to get her CBG down some

## 2013-03-26 NOTE — Assessment & Plan Note (Signed)
Well controlled 

## 2013-03-26 NOTE — Assessment & Plan Note (Signed)
Uncontrolled, has not had statin Give Crestor 20mg  samples during visit

## 2013-03-26 NOTE — Progress Notes (Signed)
  Subjective:    Patient ID: RIFKY LAPRE, female    DOB: 1951/02/27, 62 y.o.   MRN: [id number]  HPI  Patient here follow chronic medical problems. She has no specific concerns today. She's been unable to get her insulin and her Crestor. Her labs were reviewed her A1c is worsening at 10%. She does have some Humalog at home which she's been taking 30 units with meals typically twice a day.  She did not bring her meter with her today but states her blood sugars stay in the 300s. She's not had any hypoglycemia which is taking the short acting insulin. Medications and labs reviewed Declines flu shot   Review of Systems  GEN- denies fatigue, fever, weight loss,weakness, recent illness HEENT- denies eye drainage, change in vision, nasal discharge, CVS- denies chest pain, palpitations RESP- denies SOB, cough, wheeze ABD- denies N/V, change in stools, abd pain GU- denies dysuria, hematuria, dribbling, incontinence MSK- denies joint pain, muscle aches, injury Neuro- denies headache, dizziness, syncope, seizure activity      Objective:   Physical Exam  GEN- NAD, alert and oriented x3 HEENT- PERRL, EOMI, non injected sclera, pink conjunctiva, MMM, oropharynx clear CVS- RRR, no murmur RESP-CTAB EXT- No edema Pulses- Radial, DP- 2+        Assessment & Plan:

## 2013-04-23 ENCOUNTER — Ambulatory Visit: Payer: Medicare Other | Admitting: Family Medicine

## 2013-05-11 ENCOUNTER — Ambulatory Visit (INDEPENDENT_AMBULATORY_CARE_PROVIDER_SITE_OTHER): Payer: Medicare Other | Admitting: Family Medicine

## 2013-05-11 ENCOUNTER — Encounter: Payer: Self-pay | Admitting: Family Medicine

## 2013-05-11 VITALS — BP 108/80 | HR 80 | Temp 98.0°F | Resp 18 | Ht <= 58 in | Wt 189.0 lb

## 2013-05-11 DIAGNOSIS — M21619 Bunion of unspecified foot: Secondary | ICD-10-CM | POA: Insufficient documentation

## 2013-05-11 DIAGNOSIS — Z23 Encounter for immunization: Secondary | ICD-10-CM

## 2013-05-11 DIAGNOSIS — M2012 Hallux valgus (acquired), left foot: Secondary | ICD-10-CM

## 2013-05-11 DIAGNOSIS — R42 Dizziness and giddiness: Secondary | ICD-10-CM

## 2013-05-11 DIAGNOSIS — E785 Hyperlipidemia, unspecified: Secondary | ICD-10-CM

## 2013-05-11 DIAGNOSIS — IMO0001 Reserved for inherently not codable concepts without codable children: Secondary | ICD-10-CM

## 2013-05-11 DIAGNOSIS — E119 Type 2 diabetes mellitus without complications: Secondary | ICD-10-CM

## 2013-05-11 MED ORDER — METOPROLOL TARTRATE 100 MG PO TABS: ORAL_TABLET | ORAL | Status: DC

## 2013-05-11 MED ORDER — HYDROCODONE-ACETAMINOPHEN 7.5-325 MG PO TABS: 1.0000 | ORAL_TABLET | Freq: Four times a day (QID) | ORAL | Status: DC | PRN

## 2013-05-11 MED ORDER — PAROXETINE HCL 40 MG PO TABS: ORAL_TABLET | ORAL | Status: DC

## 2013-05-11 MED ORDER — MECLIZINE HCL 25 MG PO TABS: 25.0000 mg | ORAL_TABLET | Freq: Three times a day (TID) | ORAL | Status: DC | PRN

## 2013-05-11 NOTE — Assessment & Plan Note (Signed)
She has a symptomatic bunion on the great toe as well as some breakdown on the foot with the callus. She'll be referred to podiatry for evaluation and treatment

## 2013-05-11 NOTE — Patient Instructions (Addendum)
Referral to Dr. Ulice Brilliant Referral to Dr. Nile Riggs for your eyes Pain medication increased Pneumonia vaccine given Meclizine for vertigo F/u 2 months

## 2013-05-11 NOTE — Assessment & Plan Note (Signed)
How increase her hydrocodone to 7.5 mg

## 2013-05-11 NOTE — Progress Notes (Signed)
   Subjective:    Patient ID: Tina [name], female    DOB: 10-04-1950, 62 y.o.   MRN: [id number]  HPI  Patient here for interim visit for her diabetes mellitus. She is now on her insulin therapy and is taking Levemir 50 units in Humalog 20 units with each meal. Her fasting sugar this morning was down to 155. Her sugars have been less than 200 throughout the day. She's not had any hypoglycemia. She will like to be referred back to ophthalmology for examination. She's also had some left foot pain she has a bunion which is very painful when she walks on it.  Medications were reviewed. Most of her medications are being covered by the health department assistance program.  Fibromyalgia/neuropathy-she states that her pain medication wears off she's not had a prescription since July and uses was very sparingly. She's currently on hydrocodone 5/325 to states it only lasts for about 3-4 hours.  In her ear on and off for the past couple months- has a dizzy sensation. No specific triggers. She was concerned about infection she did have some mild drainage out of the right ear last week.  Review of Systems   GEN- denies fatigue, fever, weight loss,weakness, recent illness HEENT- denies eye drainage, change in vision, nasal discharge, CVS- denies chest pain, palpitations RESP- denies SOB, cough, wheeze ABD- denies N/V, change in stools, abd pain GU- denies dysuria, hematuria, dribbling, incontinence MSK- denies joint pain, muscle aches, injury Neuro- denies headache, dizziness, syncope, seizure activity      Objective:   Physical Exam GEN- NAD, alert and oriented x3 HEENT- PERRL, EOMI, non injected sclera, pink conjunctiva, MMM, oropharynx clear, TM clear bilat no effusion, canals clear Neck- Supple, No LAD CVS- RRR, no murmur RESP-CTAB EXT- No edema, Left foot- bunion noted TTP, +callus at metatarsal arch beneath great toe, TTP at site  Pulses- Radial, DP- 2+ [id number]-CNII-XII in tact, no  deficits        Assessment & Plan:   She declines flu shot. Pneumonia vaccine given

## 2013-05-11 NOTE — Assessment & Plan Note (Signed)
Meclizine when necessary

## 2013-05-11 NOTE — Assessment & Plan Note (Signed)
Blood sugars are much improved. She will continue her current doses of insulin. She overall feels much better. A1c cholesterol panel will be rechecked at next visit

## 2013-05-30 ENCOUNTER — Telehealth: Payer: Self-pay | Admitting: *Deleted

## 2013-05-30 ENCOUNTER — Other Ambulatory Visit: Payer: Self-pay | Admitting: *Deleted

## 2013-05-30 MED ORDER — INSULIN SYRINGES (DISPOSABLE) U-100 1 ML MISC: 1.0000 [IU] | Freq: Every day | Status: DC

## 2013-05-30 NOTE — Telephone Encounter (Signed)
Meds refilled.

## 2013-06-19 ENCOUNTER — Other Ambulatory Visit: Payer: Self-pay | Admitting: Family Medicine

## 2013-06-19 NOTE — Telephone Encounter (Signed)
Test striprefill

## 2013-06-29 ENCOUNTER — Telehealth: Payer: Self-pay | Admitting: Family Medicine

## 2013-06-29 NOTE — Telephone Encounter (Signed)
What is described does not sound like UTI She needs to make appt She can use a over the counter hydrocortisone in the area, not vaginally to help with itch

## 2013-06-29 NOTE — Telephone Encounter (Signed)
Believes she has UTI.  Itching and burning on exterior peri area.  Believes she has use tissue paper she is sensitive to.  Want to know if you will call some thing in for her?

## 2013-06-29 NOTE — Telephone Encounter (Signed)
Pt called with provider recommendations.  Does not want to make appt right now and hung up phone.

## 2013-07-02 ENCOUNTER — Telehealth: Payer: Self-pay | Admitting: *Deleted

## 2013-07-02 MED ORDER — GLUCOSE BLOOD VI STRP: ORAL_STRIP | Status: DC

## 2013-07-02 NOTE — Telephone Encounter (Signed)
Refilled meds pharmacy had asked for DX code for insurance billing.

## 2013-07-03 ENCOUNTER — Telehealth: Payer: Self-pay | Admitting: Family Medicine

## 2013-07-03 NOTE — Telephone Encounter (Signed)
Call back number is [phone] [facility] Pt is needing something called in for her yeast infection (burning and itching)

## 2013-07-04 NOTE — Telephone Encounter (Signed)
Pt needs to be seen

## 2013-07-05 NOTE — Telephone Encounter (Signed)
Pt aware needs tjo make appt, will call back to schedule

## 2013-07-13 ENCOUNTER — Ambulatory Visit: Payer: Medicare Other | Admitting: Family Medicine

## 2013-07-20 ENCOUNTER — Ambulatory Visit: Payer: Medicare Other | Admitting: Family Medicine

## 2013-08-10 ENCOUNTER — Ambulatory Visit (INDEPENDENT_AMBULATORY_CARE_PROVIDER_SITE_OTHER): Payer: BC Managed Care – PPO | Admitting: Family Medicine

## 2013-08-10 ENCOUNTER — Encounter: Payer: Self-pay | Admitting: Family Medicine

## 2013-08-10 VITALS — BP 140/72 | HR 74 | Temp 98.1°F | Resp 18 | Ht 59.0 in | Wt 192.0 lb

## 2013-08-10 DIAGNOSIS — E1165 Type 2 diabetes mellitus with hyperglycemia: Secondary | ICD-10-CM

## 2013-08-10 DIAGNOSIS — E119 Type 2 diabetes mellitus without complications: Secondary | ICD-10-CM

## 2013-08-10 DIAGNOSIS — J209 Acute bronchitis, unspecified: Secondary | ICD-10-CM

## 2013-08-10 DIAGNOSIS — I10 Essential (primary) hypertension: Secondary | ICD-10-CM

## 2013-08-10 DIAGNOSIS — E785 Hyperlipidemia, unspecified: Secondary | ICD-10-CM

## 2013-08-10 DIAGNOSIS — IMO0001 Reserved for inherently not codable concepts without codable children: Secondary | ICD-10-CM

## 2013-08-10 LAB — LIPID PANEL
CHOL/HDL RATIO: 5.1 ratio
Cholesterol: 231 mg/dL — ABNORMAL HIGH (ref 0–200)
HDL: 45 mg/dL (ref >39)
Triglycerides: 427 mg/dL — ABNORMAL HIGH (ref <150)

## 2013-08-10 LAB — CBC
HEMATOCRIT: 41.8 % (ref 36.0–46.0)
Hemoglobin: 14.4 g/dL (ref 12.0–15.0)
MCH: 32.7 pg (ref 26.0–34.0)
MCHC: 34.4 g/dL (ref 30.0–36.0)
MCV: 95 fL (ref 78.0–100.0)
Platelets: 219 10*3/uL (ref 150–400)
RBC: 4.4 MIL/uL (ref 3.87–5.11)
RDW: 13.9 % (ref 11.5–15.5)
WBC: 5.9 10*3/uL (ref 4.0–10.5)

## 2013-08-10 LAB — COMPREHENSIVE METABOLIC PANEL
ALK PHOS: 74 U/L (ref 39–117)
ALT: 19 U/L (ref 0–35)
AST: 20 U/L (ref 0–37)
Albumin: 3.9 g/dL (ref 3.5–5.2)
BILIRUBIN TOTAL: 0.6 mg/dL (ref 0.2–1.2)
BUN: 15 mg/dL (ref 6–23)
CO2: 25 mEq/L (ref 19–32)
Calcium: 8.8 mg/dL (ref 8.4–10.5)
Chloride: 101 mEq/L (ref 96–112)
Creat: 0.7 mg/dL (ref 0.50–1.10)
Glucose, Bld: 304 mg/dL — ABNORMAL HIGH (ref 70–99)
Potassium: 4.5 mEq/L (ref 3.5–5.3)
SODIUM: 138 meq/L (ref 135–145)
TOTAL PROTEIN: 6.8 g/dL (ref 6.0–8.3)

## 2013-08-10 LAB — MICROALBUMIN / CREATININE URINE RATIO
CREATININE, URINE: 166.3 mg/dL
Microalb Creat Ratio: 9.4 mg/g (ref 0.0–30.0)
Microalb, Ur: 1.57 mg/dL (ref 0.00–1.89)

## 2013-08-10 LAB — HEMOGLOBIN A1C
Hgb A1c MFr Bld: 9.5 % — ABNORMAL HIGH (ref <5.7)
Mean Plasma Glucose: 226 mg/dL — ABNORMAL HIGH (ref <117)

## 2013-08-10 MED ORDER — HYDROCODONE-ACETAMINOPHEN 7.5-325 MG PO TABS: 1.0000 | ORAL_TABLET | Freq: Four times a day (QID) | ORAL | Status: DC | PRN

## 2013-08-10 MED ORDER — ALBUTEROL SULFATE HFA 108 (90 BASE) MCG/ACT IN AERS: 2.0000 | INHALATION_SPRAY | Freq: Four times a day (QID) | RESPIRATORY_TRACT | Status: DC | PRN

## 2013-08-10 NOTE — Patient Instructions (Signed)
Start mucinex DM or robitussin DM Use the albuterol as needed Continue current medications We will call with lab results  F/U 3 months

## 2013-08-12 ENCOUNTER — Encounter: Payer: Self-pay | Admitting: Family Medicine

## 2013-08-12 NOTE — Assessment & Plan Note (Signed)
Urine reck her to be done. She's currently on 50 units of insulin at bedtime as well as Humalog with meals. She's been more compliant recently has able to keep her insulin on a regular basis. She would benefit from an endocrinologist however is unable to afford to go. Her goal is to please get her A1c below 8%

## 2013-08-12 NOTE — Assessment & Plan Note (Signed)
She's been on a host of different medications with no improvement in her fibromyalgia do not see any other medications I can add on she can continue the low-dose hydrocodone she declines going to neurology she has not seen any benefit

## 2013-08-12 NOTE — Progress Notes (Signed)
Patient ID: Tina CanalJudy H Brandes, female   DOB: [birth year], 63 y.o.   MRN: [id number]     Subjective:    Patient ID: Tina [name], female    DOB: [birth year], 63 y.o.   MRN: [id number]  Patient presents for 2 month F/U  patient here to followup an interim diabetes mellitus high blood pressure. She has a history of fibromyalgia as well which is not well-controlled she's been on multiple medications therefore we are using low-dose hydrocodone for that as well as her arthritis pains. Diabetes mellitus her blood sugars have been upwards of 200 fasting she did not bring her meter today she states that she had a few times where she had hypoglycemia however she is known to not wake up until about noon and therefore eats late. She states that she is taking 3 injections a day but I'm not sure about this is per above she is waking up very late in the day. She is using Levemir 50 units at bedtime.  Also complains of some coughing congestion for the past couple weeks she's not used any over-the-counter medications at nighttime she hears herself wheezing with her cough she's not had any fever or production is clear when she has some    Review Of Systems:  GEN- denies fatigue, fever, weight loss,weakness, recent illness HEENT- denies eye drainage, change in vision, nasal discharge, CVS- denies chest pain, palpitations RESP- denies SOB, +cough, wheeze ABD- denies N/V, change in stools, abd pain GU- denies dysuria, hematuria, dribbling, incontinence MSK- + joint pain, muscle aches, injury Neuro- denies headache, dizziness, syncope, seizure activity       Objective:    BP 140/72  Pulse 74  Temp(Src) 98.1 F (36.7 C)  Resp 18  Ht 4\' 11"  (1.499 m)  Wt 192 lb (87.091 kg)  BMI 38.76 kg/m2 GEN- NAD, alert and oriented x3 HEENT- PERRL, EOMI, non injected sclera, pink conjunctiva, MMM, oropharynx clear CVS- RRR, no murmur RESP-mild upper airway congestion, otherwise Clear, no wheeze, no rhonchi EXT- No  edema Pulses- Radial, DP- 2+       Assessment & Plan:      Problem List Items Addressed This Visit   HYPERTENSION     No BP medications today blood pressure elevated some    HYPERLIPIDEMIA     I will recheck her fasting lipids today she's had very elevated triglycerides she is on max dose Crestor    FIBROMYALGIA     She's been on a host of different medications with no improvement in her fibromyalgia do not see any other medications I can add on she can continue the low-dose hydrocodone she declines going to neurology she has not seen any benefit    DIABETES MELLITUS, TYPE II     Urine reck her to be done. She's currently on 50 units of insulin at bedtime as well as Humalog with meals. She's been more compliant recently has able to keep her insulin on a regular basis. She would benefit from an endocrinologist however is unable to afford to go. Her goal is to please get her A1c below 8%    Relevant Orders      Microalbumin / creatinine urine ratio (Completed)   Acute bronchitis - Primary     I think she may have some mild bronchitis. She will use albuterol as needed also Mucinex DM     Other Visit Diagnoses   Type II or unspecified type diabetes mellitus without mention of complication, uncontrolled  Note: This dictation was prepared with Dragon dictation along with smaller phrase technology. Any transcriptional errors that result from this process are unintentional.

## 2013-08-12 NOTE — Assessment & Plan Note (Signed)
No BP medications today blood pressure elevated some

## 2013-08-12 NOTE — Assessment & Plan Note (Signed)
I think she may have some mild bronchitis. She will use albuterol as needed also Mucinex DM

## 2013-08-12 NOTE — Assessment & Plan Note (Signed)
I will recheck her fasting lipids today she's had very elevated triglycerides she is on max dose Crestor

## 2013-08-22 ENCOUNTER — Emergency Department (HOSPITAL_COMMUNITY): Payer: Medicare Other

## 2013-08-22 ENCOUNTER — Encounter (HOSPITAL_COMMUNITY): Payer: Self-pay | Admitting: Emergency Medicine

## 2013-08-22 ENCOUNTER — Emergency Department (HOSPITAL_COMMUNITY)
Admission: EM | Admit: 2013-08-22 | Discharge: 2013-08-22 | Disposition: A | Discharge status: Home / Self Care | Payer: Medicare Other | Attending: Emergency Medicine | Admitting: Emergency Medicine

## 2013-08-22 DIAGNOSIS — E785 Hyperlipidemia, unspecified: Secondary | ICD-10-CM | POA: Insufficient documentation

## 2013-08-22 DIAGNOSIS — I10 Essential (primary) hypertension: Secondary | ICD-10-CM | POA: Insufficient documentation

## 2013-08-22 DIAGNOSIS — Z7982 Long term (current) use of aspirin: Secondary | ICD-10-CM | POA: Insufficient documentation

## 2013-08-22 DIAGNOSIS — Z794 Long term (current) use of insulin: Secondary | ICD-10-CM | POA: Insufficient documentation

## 2013-08-22 DIAGNOSIS — F329 Major depressive disorder, single episode, unspecified: Secondary | ICD-10-CM | POA: Insufficient documentation

## 2013-08-22 DIAGNOSIS — E669 Obesity, unspecified: Secondary | ICD-10-CM | POA: Insufficient documentation

## 2013-08-22 DIAGNOSIS — I509 Heart failure, unspecified: Secondary | ICD-10-CM | POA: Insufficient documentation

## 2013-08-22 DIAGNOSIS — Z9889 Other specified postprocedural states: Secondary | ICD-10-CM | POA: Insufficient documentation

## 2013-08-22 DIAGNOSIS — Z880 Allergy status to penicillin: Secondary | ICD-10-CM | POA: Insufficient documentation

## 2013-08-22 DIAGNOSIS — J40 Bronchitis, not specified as acute or chronic: Secondary | ICD-10-CM

## 2013-08-22 DIAGNOSIS — E119 Type 2 diabetes mellitus without complications: Secondary | ICD-10-CM | POA: Insufficient documentation

## 2013-08-22 DIAGNOSIS — F3289 Other specified depressive episodes: Secondary | ICD-10-CM | POA: Insufficient documentation

## 2013-08-22 DIAGNOSIS — Z87891 Personal history of nicotine dependence: Secondary | ICD-10-CM | POA: Insufficient documentation

## 2013-08-22 DIAGNOSIS — IMO0001 Reserved for inherently not codable concepts without codable children: Secondary | ICD-10-CM | POA: Insufficient documentation

## 2013-08-22 DIAGNOSIS — K219 Gastro-esophageal reflux disease without esophagitis: Secondary | ICD-10-CM | POA: Insufficient documentation

## 2013-08-22 DIAGNOSIS — Z9089 Acquired absence of other organs: Secondary | ICD-10-CM | POA: Insufficient documentation

## 2013-08-22 DIAGNOSIS — J209 Acute bronchitis, unspecified: Secondary | ICD-10-CM | POA: Insufficient documentation

## 2013-08-22 DIAGNOSIS — Z79899 Other long term (current) drug therapy: Secondary | ICD-10-CM | POA: Insufficient documentation

## 2013-08-22 LAB — CBC WITH DIFFERENTIAL/PLATELET
Basophils Absolute: 0 10*3/uL (ref 0.0–0.1)
Basophils Relative: 0 % (ref 0–1)
Eosinophils Absolute: 0 10*3/uL (ref 0.0–0.7)
Eosinophils Relative: 1 % (ref 0–5)
HCT: 39 % (ref 36.0–46.0)
HEMOGLOBIN: 13.6 g/dL (ref 12.0–15.0)
LYMPHS ABS: 1.3 10*3/uL (ref 0.7–4.0)
Lymphocytes Relative: 28 % (ref 12–46)
MCH: 32.9 pg (ref 26.0–34.0)
MCHC: 34.9 g/dL (ref 30.0–36.0)
MCV: 94.4 fL (ref 78.0–100.0)
MONO ABS: 0.4 10*3/uL (ref 0.1–1.0)
MONOS PCT: 9 % (ref 3–12)
Neutro Abs: 3 10*3/uL (ref 1.7–7.7)
Neutrophils Relative %: 62 % (ref 43–77)
Platelets: 162 10*3/uL (ref 150–400)
RBC: 4.13 MIL/uL (ref 3.87–5.11)
RDW: 13.2 % (ref 11.5–15.5)
WBC: 4.8 10*3/uL (ref 4.0–10.5)

## 2013-08-22 LAB — COMPREHENSIVE METABOLIC PANEL
ALK PHOS: 121 U/L — AB (ref 39–117)
ALT: 41 U/L — ABNORMAL HIGH (ref 0–35)
AST: 41 U/L — ABNORMAL HIGH (ref 0–37)
Albumin: 3.6 g/dL (ref 3.5–5.2)
BUN: 12 mg/dL (ref 6–23)
CHLORIDE: 98 meq/L (ref 96–112)
CO2: 25 meq/L (ref 19–32)
CREATININE: 1.5 mg/dL — AB (ref 0.50–1.10)
Calcium: 8.6 mg/dL (ref 8.4–10.5)
GFR calc Af Amer: 42 mL/min — ABNORMAL LOW (ref >90)
GFR, EST NON AFRICAN AMERICAN: 36 mL/min — AB (ref >90)
GLUCOSE: 309 mg/dL — AB (ref 70–99)
POTASSIUM: 3.9 meq/L (ref 3.7–5.3)
Sodium: 138 mEq/L (ref 137–147)
Total Bilirubin: 0.6 mg/dL (ref 0.3–1.2)
Total Protein: 7.4 g/dL (ref 6.0–8.3)

## 2013-08-22 LAB — PRO B NATRIURETIC PEPTIDE: PRO B NATRI PEPTIDE: 558.8 pg/mL — AB (ref 0–125)

## 2013-08-22 LAB — TROPONIN I

## 2013-08-22 MED ORDER — LEVOFLOXACIN 500 MG PO TABS
500.0000 mg | ORAL_TABLET | Freq: Once | ORAL | Status: AC
  Administered 2013-08-22: 500 mg via ORAL
  Filled 2013-08-22: qty 1

## 2013-08-22 MED ORDER — SODIUM CHLORIDE 0.9 % IV BOLUS (SEPSIS)
500.0000 mL | Freq: Once | INTRAVENOUS | Status: AC
  Administered 2013-08-22: 500 mL via INTRAVENOUS

## 2013-08-22 MED ORDER — HYDROCOD POLST-CHLORPHEN POLST 10-8 MG/5ML PO LQCR: 5.0000 mL | Freq: Two times a day (BID) | ORAL | Status: DC | PRN

## 2013-08-22 MED ORDER — PREDNISONE 10 MG PO TABS: 20.0000 mg | ORAL_TABLET | Freq: Every day | ORAL | Status: DC

## 2013-08-22 MED ORDER — LEVOFLOXACIN 500 MG PO TABS: 500.0000 mg | ORAL_TABLET | Freq: Every day | ORAL | Status: DC

## 2013-08-22 MED ORDER — PREDNISONE 50 MG PO TABS
60.0000 mg | ORAL_TABLET | Freq: Once | ORAL | Status: AC
  Administered 2013-08-22: 60 mg via ORAL
  Filled 2013-08-22 (×2): qty 1

## 2013-08-22 NOTE — ED Notes (Signed)
Patient was very dizzy upon sitting and standing.  Patient's o2 sat dropped from 96% in lying position to 90% in standing position.  Dr Estell HarpinZammit was notified.

## 2013-08-22 NOTE — ED Notes (Signed)
Pt states productive cough, brown in color. States she was dx with bronchitis 2 weeks ago and has gotten much worse. States sob.

## 2013-08-22 NOTE — ED Notes (Signed)
MD at bedside. 

## 2013-08-22 NOTE — ED Notes (Signed)
Pt ambulated to bathroom w/o increase in shortness of breath.

## 2013-08-22 NOTE — ED Provider Notes (Signed)
CSN: [id number]     Arrival date & time 08/22/13  1736 History  This chart was scribed for Benny LennertJoseph L Jayceon Troy, MD by Beverly MilchJ Harrison Collins, ED Scribe. This patient was seen in room APA19/APA19 and the patient's care was started at 5:55 PM.    Chief Complaint  Patient presents with  . Shortness of Breath      Patient is a 63 y.o. female presenting with shortness of breath. The history is provided by the patient and the spouse.  Shortness of Breath Severity:  Moderate Onset quality:  Gradual Duration:  2 weeks Timing:  Constant Progression:  Worsening Chronicity:  Recurrent Context: activity   Relieved by:  Nothing Worsened by:  Deep breathing, exertion and movement Ineffective treatments:  Inhaler (Mucinex) Associated symptoms: abdominal pain (with deep breathing), cough and fever   Associated symptoms: no chest pain, no headaches and no rash   Risk factors: obesity   Risk factors comment:  H/o CHF and HTN   Past Medical History  Diagnosis Date  . Hyperlipidemia   . Hypertension   . Diabetes mellitus   . Depression   . Fibromyalgia   . GERD (gastroesophageal reflux disease)   . CHF (congestive heart failure)    Past Surgical History  Procedure Laterality Date  . Hernia repair  approx 1969  . Tonsillectomy and adenoidectomy  1966  . Cholecystectomy  1991  . Neck surgery  1998   Family History  Problem Relation Age of Onset  . Hypertension Mother   . Hyperlipidemia Mother   . Heart disease Mother   . Stroke Sister   . Hypertension Sister   . Hyperlipidemia Sister   . Heart disease Sister    History  Substance Use Topics  . Smoking status: Former Smoker    Quit date: 05/07/2010  . Smokeless tobacco: Never Used  . Alcohol Use: No   OB History   Grav Para Term Preterm Abortions TAB SAB Ect Mult Living                 Review of Systems  Constitutional: Positive for fever. Negative for appetite change and fatigue.  HENT: Negative for congestion, ear discharge and  sinus pressure.   Eyes: Negative for discharge.  Respiratory: Positive for cough and shortness of breath.   Cardiovascular: Negative for chest pain.  Gastrointestinal: Positive for abdominal pain (with deep breathing). Negative for diarrhea.  Genitourinary: Negative for frequency and hematuria.  Musculoskeletal: Negative for back pain.  Skin: Negative for rash.  Neurological: Negative for seizures and headaches.  Psychiatric/Behavioral: Negative for hallucinations.      Allergies  Cefuroxime axetil; Fentanyl and related; Niaspan; Sulfur; Vasotec; Welchol; Lyrica; and Penicillins  Home Medications   Current Outpatient Rx  Name  Route  Sig  Dispense  Refill  . albuterol (PROVENTIL HFA;VENTOLIN HFA) 108 (90 BASE) MCG/ACT inhaler   Inhalation   Inhale 2 puffs into the lungs every 6 (six) hours as needed for wheezing or shortness of breath.   1 Inhaler   2   . aspirin EC 81 MG tablet   Oral   Take 81 mg by mouth at bedtime.         . busPIRone (BUSPAR) 15 MG tablet   Oral   Take 15 mg by mouth 2 (two) times daily.         . furosemide (LASIX) 40 MG tablet   Oral   Take 60 mg by mouth daily.         .Marland Kitchen  HYDROcodone-acetaminophen (NORCO) 7.5-325 MG per tablet   Oral   Take 1 tablet by mouth every 6 (six) hours as needed for moderate pain.   45 tablet   0   . insulin detemir (LEVEMIR) 100 UNIT/ML injection   Subcutaneous   Inject 50-70 Units into the skin at bedtime.          . insulin lispro (HUMALOG) 100 UNIT/ML injection   Subcutaneous   Inject 20 Units into the skin 3 (three) times daily before meals.         Marland Kitchen losartan (COZAAR) 25 MG tablet   Oral   Take 25 mg by mouth every morning.         . metoprolol (LOPRESSOR) 100 MG tablet   Oral   Take 100 mg by mouth 2 (two) times daily.         . Omega-3 Fatty Acids (FISH OIL) 1000 MG CAPS   Oral   Take 2 capsules by mouth 2 (two) times daily.         Marland Kitchen omeprazole (PRILOSEC) 20 MG capsule   Oral    Take 20 mg by mouth every morning.         Marland Kitchen PARoxetine (PAXIL) 40 MG tablet   Oral   Take 40 mg by mouth every morning.         . rosuvastatin (CRESTOR) 40 MG tablet   Oral   Take 40 mg by mouth every evening.          Triage Vitals: BP 87/61  Pulse 58  Temp(Src) 98.8 F (37.1 C)  Resp 24  Ht 4\' 11"  (1.499 m)  Wt 200 lb (90.719 kg)  BMI 40.37 kg/m2  SpO2 93%  Physical Exam  Nursing note and vitals reviewed. Constitutional: She is oriented to person, place, and time. She appears well-developed.  HENT:  Head: Normocephalic.  Eyes: Conjunctivae and EOM are normal. No scleral icterus.  Neck: Neck supple. No thyromegaly present.  Cardiovascular: Normal rate and regular rhythm.  Exam reveals no gallop and no friction rub.   No murmur heard. Pulmonary/Chest: No stridor. She has no wheezes. She has no rales. She exhibits no tenderness.  Abdominal: She exhibits no distension. There is no tenderness. There is no rebound.  Musculoskeletal: Normal range of motion. She exhibits no edema.  Lymphadenopathy:    She has no cervical adenopathy.  Neurological: She is oriented to person, place, and time. She exhibits normal muscle tone. Coordination normal.  Skin: No rash noted. No erythema.  Psychiatric: She has a normal mood and affect. Her behavior is normal.    ED Course  Procedures (including critical care time)  DIAGNOSTIC STUDIES: Oxygen Saturation is 93% on RA, low by my interpretation.    COORDINATION OF CARE: 5:57 PM- Pt's parents advised of plan for treatment. Parents verbalize understanding and agreement with plan.      Labs Review Labs Reviewed  COMPREHENSIVE METABOLIC PANEL - Abnormal; Notable for the following:    Glucose, Bld 309 (*)    Creatinine, Ser 1.50 (*)    AST 41 (*)    ALT 41 (*)    Alkaline Phosphatase 121 (*)    GFR calc non Af Amer 36 (*)    GFR calc Af Amer 42 (*)    All other components within normal limits  PRO B NATRIURETIC PEPTIDE -  Abnormal; Notable for the following:    Pro B Natriuretic peptide (BNP) 558.8 (*)    All other components within  normal limits  CBC WITH DIFFERENTIAL  TROPONIN I   Imaging Review No results found.   EKG Interpretation None      MDM   Final diagnoses:  None   Bronchitis with bronchospasm.   Pt refused neb tx and wanted to be discharged home now    . The chart was scribed for me under my direct supervision.  I personally performed the history, physical, and medical decision making and all procedures in the evaluation of this patient.Benny Lennert, MD 08/22/13 2043

## 2013-08-22 NOTE — Discharge Instructions (Signed)
Follow up with your md next week. °

## 2013-08-23 ENCOUNTER — Ambulatory Visit: Payer: Medicare Other | Admitting: Family Medicine

## 2013-09-05 ENCOUNTER — Telehealth: Payer: Self-pay | Admitting: *Deleted

## 2013-09-05 MED ORDER — FLUCONAZOLE 150 MG PO TABS: ORAL_TABLET | ORAL | Status: DC

## 2013-09-05 NOTE — Telephone Encounter (Signed)
Message copied by Phillips OdorSIX, CHRISTINA H on Wed Sep 05, 2013 10:50 AM ------      Message from: Malvin JohnsBULLINS, SUSAN S      Created: Wed Sep 05, 2013 10:28 AM       Patient would like a call back from you at [phone] ------

## 2013-09-05 NOTE — Telephone Encounter (Signed)
Returned call to patient. LMTRC. 

## 2013-09-05 NOTE — Telephone Encounter (Signed)
Received return call from patient.   Reports that she has been seen in ER multiple times since her last visit and has been on a few ABT.   States that she believes ABT has caused her to have UTI.   States that she has increased urgency to void and burning with urination.  Reports that she does not want to come in for full office visit.   Advised that UA may be required.   MD please advise.

## 2013-09-05 NOTE — Telephone Encounter (Signed)
Call placed to patient and patient made aware.   Prescription sent to pharmacy.  

## 2013-09-05 NOTE — Telephone Encounter (Signed)
Send in Diflucan 150mg  po x 1, repeat in 3 days If still has symptoms needs to have OV

## 2013-09-11 ENCOUNTER — Other Ambulatory Visit: Payer: Self-pay | Admitting: Family Medicine

## 2013-09-14 ENCOUNTER — Ambulatory Visit (INDEPENDENT_AMBULATORY_CARE_PROVIDER_SITE_OTHER): Payer: BC Managed Care – PPO | Admitting: Family Medicine

## 2013-09-14 ENCOUNTER — Encounter: Payer: Self-pay | Admitting: Family Medicine

## 2013-09-14 VITALS — BP 136/80 | HR 78 | Temp 98.0°F | Resp 16 | Ht 59.5 in | Wt 190.0 lb

## 2013-09-14 DIAGNOSIS — R300 Dysuria: Secondary | ICD-10-CM

## 2013-09-14 DIAGNOSIS — N760 Acute vaginitis: Secondary | ICD-10-CM | POA: Insufficient documentation

## 2013-09-14 DIAGNOSIS — E119 Type 2 diabetes mellitus without complications: Secondary | ICD-10-CM

## 2013-09-14 LAB — WET PREP FOR TRICH, YEAST, CLUE
Clue Cells Wet Prep HPF POC: NONE SEEN
Trich, Wet Prep: NONE SEEN
YEAST WET PREP: NONE SEEN

## 2013-09-14 LAB — URINALYSIS, ROUTINE W REFLEX MICROSCOPIC
Glucose, UA: 100 mg/dL — AB
Ketones, ur: NEGATIVE mg/dL
Leukocytes, UA: NEGATIVE
NITRITE: NEGATIVE
Protein, ur: 30 mg/dL — AB
Specific Gravity, Urine: >1.03 — ABNORMAL HIGH (ref 1.005–1.030)
UROBILINOGEN UA: 0.2 mg/dL (ref 0.0–1.0)
pH: 6 (ref 5.0–8.0)

## 2013-09-14 LAB — URINALYSIS, MICROSCOPIC ONLY: Crystals: NONE SEEN

## 2013-09-14 MED ORDER — NYSTATIN 100000 UNIT/GM EX CREA: 1.0000 "application " | TOPICAL_CREAM | Freq: Two times a day (BID) | CUTANEOUS | Status: DC

## 2013-09-14 NOTE — Patient Instructions (Addendum)
Release of records from [facility] Levemir for 50 units  Continue the novolog  Use cream twice a day on outside We will call with results

## 2013-09-14 NOTE — Progress Notes (Signed)
Patient ID: Tina [name], female   DOB: May 14, 1951, 63 y.o.   MRN: [id number]   Subjective:    Patient ID: Tina CanalJudy H [name], female    DOB: May 14, 1951, 63 y.o.   MRN: [id number]  Patient presents for Urine check  patient here secondary to dysuria for the past week. She was recently admitted to [facility] secondary to asthma bronchitis. She was treated with antibiotics steroids inhalers. Her breathing is much improved however since then she noticed irritation especially when she urinates. She has severe burning sensation and pain in the vaginal area. I did send in Diflucan which she did complete 2 doses of but still has discomfort. She does note that she's been wearing some pads in her underwear secondary to some mild incontinence with sneezing from all the coughing that she was doing in the hospital and thinks this may also be irritating her some.  Diabetes mellitus her blood sugars have been up and down this morning it was 280 but if she gets all for shots in her blood sugars are in the mid 100s. She has been using Levemir 40 units in the 50 units because she forgot. She often misses her mealtime coverage as well.    Review Of Systems:  GEN- denies fatigue, fever, weight loss,weakness, recent illness HEENT- denies eye drainage, change in vision, nasal discharge, CVS- denies chest pain, palpitations RESP- denies SOB, cough, wheeze ABD- denies N/V, change in stools, abd pain GU- +dysuria, hematuria, dribbling, incontinence Neuro- denies headache, dizziness, syncope, seizure activity       Objective:    BP 136/80  Pulse 78  Temp(Src) 98 F (36.7 C) (Oral)  Resp 16  Ht 4' 11.5" (1.511 m)  Wt 190 lb (86.183 kg)  BMI 37.75 kg/m2 GEN- NAD, alert and oriented x3 HEENT-PERRL,EOMI, non icteric, pink conjunctiva, MMM,oropharynx clear Neck- supple, no LAD CVS-RRR, no murmur RESP-CTAB ABD-NABS,soft,mild suprapubic tenderness, no CVA tenderness GU- normal external genitalia, vaginal  mucosa pink atrohy, atrophy of labia minora/majora, white discharge in crevix of labia, TTP, cervix visualized no growth, no blood form os, minimal thin clear discharge, no CMT, no ovarian masses, uterus normal size Ext- no edema        Assessment & Plan:      Problem List Items Addressed This Visit   None    Visit Diagnoses   Dysuria    -  Primary    Relevant Orders       Urinalysis, Routine w reflex microscopic (Completed)       Urine culture    Vaginitis and vulvovaginitis        Relevant Orders       WET PREP FOR TRICH, YEAST, CLUE (Completed)       Note: This dictation was prepared with Dragon dictation along with smaller phrase technology. Any transcriptional errors that result from this process are unintentional.

## 2013-09-14 NOTE — Assessment & Plan Note (Signed)
Urine will be sent for culture

## 2013-09-14 NOTE — Assessment & Plan Note (Signed)
I continue to have problems with compliance with her. She's advised to take 50 units of the Levemir and take her Humalog as prescribed

## 2013-09-14 NOTE — Assessment & Plan Note (Signed)
She has a lot of discharge around the labia I will go ahead and start her on nystatin twice a day secondary to her uncontrolled diabetes mellitus also the irritation from the patches caused some issue. She densely has atrophy therefore she may benefit from a topical estrogen as well.

## 2013-09-16 LAB — URINE CULTURE
Colony Count: NO GROWTH
ORGANISM ID, BACTERIA: NO GROWTH

## 2013-10-05 ENCOUNTER — Encounter: Payer: Self-pay | Admitting: Family Medicine

## 2013-10-05 ENCOUNTER — Ambulatory Visit (INDEPENDENT_AMBULATORY_CARE_PROVIDER_SITE_OTHER): Payer: BC Managed Care – PPO | Admitting: Family Medicine

## 2013-10-05 VITALS — BP 110/60 | HR 68 | Temp 98.4°F | Resp 18 | Wt 190.0 lb

## 2013-10-05 DIAGNOSIS — L03119 Cellulitis of unspecified part of limb: Principal | ICD-10-CM

## 2013-10-05 DIAGNOSIS — R110 Nausea: Secondary | ICD-10-CM

## 2013-10-05 DIAGNOSIS — L02419 Cutaneous abscess of limb, unspecified: Secondary | ICD-10-CM

## 2013-10-05 DIAGNOSIS — E119 Type 2 diabetes mellitus without complications: Secondary | ICD-10-CM

## 2013-10-05 LAB — CBC W/MCH & 3 PART DIFF
HEMATOCRIT: 42.8 % (ref 36.0–46.0)
Hemoglobin: 14.9 g/dL (ref 12.0–15.0)
LYMPHS ABS: 2.3 10*3/uL (ref 0.7–4.0)
Lymphocytes Relative: 16 % (ref 12–46)
MCH: 32.7 pg (ref 26.0–34.0)
MCHC: 34.8 g/dL (ref 30.0–36.0)
MCV: 94.1 fL (ref 78.0–100.0)
Neutro Abs: 11 10*3/uL — ABNORMAL HIGH (ref 1.7–7.7)
Neutrophils Relative %: 78 % — ABNORMAL HIGH (ref 43–77)
Platelets: 214 10*3/uL (ref 150–400)
RBC: 4.55 MIL/uL (ref 3.87–5.11)
RDW: 14.7 % (ref 11.5–15.5)
WBC mixed population %: 6 % (ref 3–18)
WBC: 14.1 10*3/uL — AB (ref 4.0–10.5)
WBCMIX: 0.8 10*3/uL (ref 0.1–1.8)

## 2013-10-05 MED ORDER — PROMETHAZINE HCL 25 MG/ML IJ SOLN
25.0000 mg | Freq: Once | INTRAMUSCULAR | Status: AC
  Administered 2013-10-05: 25 mg via INTRAMUSCULAR

## 2013-10-05 MED ORDER — DOXYCYCLINE HYCLATE 100 MG PO TABS: 100.0000 mg | ORAL_TABLET | Freq: Two times a day (BID) | ORAL | Status: DC

## 2013-10-05 NOTE — Progress Notes (Signed)
Patient ID: Tina [name], female   DOB: 1950/07/25, 63 y.o.   MRN: [id number]   Subjective:    Patient ID: Tina CanalJudy H [name], female    DOB: 1950/07/25, 63 y.o.   MRN: [id number]  Patient presents for bite mark on inner thigh  patient here secondary to an infection on her inner thigh. She states she does not member being bitten by any insect nausea and a boil coming up. She's not had any drainage from it but her inside of her thighs very tender and red. Unable to sit due to pain.She's also had some nausea and some mild fever. Her blood sugars have been in the 200s she is taking Levemir 50 units.    Review Of Systems:  GEN- denies fatigue, +fever, weight loss,weakness, recent illness HEENT- denies eye drainage, change in vision, nasal discharge, CVS- denies chest pain, palpitations RESP- denies SOB, cough, wheeze ABD- + N/ no V, change in stools, abd pain MSK- +joint pain, muscle aches, injury Neuro- denies headache, dizziness, syncope, seizure activity       Objective:    BP 110/60  Pulse 68  Temp(Src) 98.4 F (36.9 C) (Oral)  Resp 18  Wt 190 lb (86.183 kg) GEN- NAD, alert and oriented x3, afebrile, non toxic appearing, in pain CVS- RRR, no murmur RESP-CTAB ABD-NABS,soft,NT,ND Skin- left inner thigh- 2cm area of induration, +erythema and warmth extends another 8cm around, TTP, no fluctuant area EXT- No edema Pulses- Radial 2+        Assessment & Plan:      Problem List Items Addressed This Visit   None    Visit Diagnoses   Cellulitis and abscess of leg    -  Primary    She has a cellulitis of her leg. I do not have a discrete abscess I D today. I will put her on doxycycline have given her a shot of Phenergan for the nausea today, no red flags on exam. WBC elevated some at 14,000, return Monday for recheck    Relevant Medications       promethazine (PHENERGAN) injection 25 mg (Completed)    Other Relevant Orders       CBC w/[facility] & 3 Part Diff (Completed)    Nausea  alone        Relevant Medications       promethazine (PHENERGAN) injection 25 mg (Completed)       Note: This dictation was prepared with Dragon dictation along with smaller phrase technology. Any transcriptional errors that result from this process are unintentional.

## 2013-10-05 NOTE — Patient Instructions (Signed)
Take the hydrocodone for pain Nausea medication given  Take the doxycyline  Use warm compresses on your leg - three times a day  F/U Monday or Tuesday for recheck your leg

## 2013-10-05 NOTE — Assessment & Plan Note (Signed)
In setting of infection I will increase her Levemir to 55 units

## 2013-10-09 ENCOUNTER — Encounter: Payer: Self-pay | Admitting: Family Medicine

## 2013-10-09 ENCOUNTER — Ambulatory Visit (INDEPENDENT_AMBULATORY_CARE_PROVIDER_SITE_OTHER): Payer: BC Managed Care – PPO | Admitting: Family Medicine

## 2013-10-09 VITALS — BP 142/88 | HR 80 | Temp 98.1°F | Resp 16 | Ht <= 58 in | Wt 189.0 lb

## 2013-10-09 DIAGNOSIS — L02419 Cutaneous abscess of limb, unspecified: Secondary | ICD-10-CM

## 2013-10-09 DIAGNOSIS — L03119 Cellulitis of unspecified part of limb: Principal | ICD-10-CM

## 2013-10-09 NOTE — Patient Instructions (Signed)
Complete antibiotics You need to take all of your doses of insulin to keep infection from spreading F/U Friday for recheck on Leg before holiday weekend

## 2013-10-09 NOTE — Assessment & Plan Note (Signed)
I discussed with patient importance of taking her insulin or keep her blood sugars down. Status post IND has not expressed a significant amount of pus therefore will have her continue the antibiotics and warm compresses she will follow up on Friday for recheck before the weekend. The ear edema is decreasing however she still has a significant area of induration , no systemic symptoms

## 2013-10-09 NOTE — Progress Notes (Signed)
Patient ID: Tina [name], female   DOB: April 14, 1951, 63 y.o.   MRN: [id number]   Subjective:    Patient ID: Tina CanalJudy H Stebbins, female    DOB: April 14, 1951, 63 y.o.   MRN: [id number]  Patient presents for Re-check L leg abscess  patient here to interim followup visit on her left leg abscess cellulitis. She states it burst open today there's been some mild bloody drainage. She continues to have pain. She's not had any fever. Her blood sugars have been elevated in the 260s fasting however she's not been waking up to take her insulin during the day and is missed multiple doses. She is using her pain medication as prescribed    Review Of Systems:  GEN- denies fatigue, fever, weight loss,weakness, recent illness HEENT- denies eye drainage, change in vision, nasal discharge, CVS- denies chest pain, palpitations RESP- denies SOB, cough, wheeze ABD- denies N/V, change in stools, abd pain Neuro- denies headache, dizziness, syncope, seizure activity       Objective:    BP 142/88  Pulse 80  Temp(Src) 98.1 F (36.7 C) (Oral)  Resp 16  Ht 4\' 10"  (1.473 m)  Wt 189 lb (85.73 kg)  BMI 39.51 kg/m2 GEN- NAD, alert and oriented x3 Skin- left inner thigh- 3cm area of induration extending to outer thigh, decreased erythema, small fluctant area center of induration,+warmth extends another 4cm around, TTP, EXT- No edema Pulses- Radial 2+  Procedure- Incision and Drainage Procedure explained to patient questions answered benefits and risks discussed verbalconsent obtained. Antiseptic-Betadine Anesthesia-lidocaine w/ epi Minimal pus Minimal blood loss Patient tolerated procedure well Bandage applied       Assessment & Plan:      Problem List Items Addressed This Visit   None      Note: This dictation was prepared with Dragon dictation along with smaller phrase technology. Any transcriptional errors that result from this process are unintentional.

## 2013-10-12 ENCOUNTER — Encounter: Payer: Self-pay | Admitting: Family Medicine

## 2013-10-12 ENCOUNTER — Ambulatory Visit (INDEPENDENT_AMBULATORY_CARE_PROVIDER_SITE_OTHER): Payer: BC Managed Care – PPO | Admitting: Family Medicine

## 2013-10-12 VITALS — BP 138/78 | HR 78 | Temp 98.4°F | Resp 16 | Ht <= 58 in | Wt 189.0 lb

## 2013-10-12 DIAGNOSIS — L03119 Cellulitis of unspecified part of limb: Principal | ICD-10-CM

## 2013-10-12 DIAGNOSIS — L02419 Cutaneous abscess of limb, unspecified: Secondary | ICD-10-CM

## 2013-10-12 MED ORDER — DOXYCYCLINE HYCLATE 100 MG PO TABS: 100.0000 mg | ORAL_TABLET | Freq: Two times a day (BID) | ORAL | Status: DC

## 2013-10-12 NOTE — Progress Notes (Signed)
Patient ID: Tina [name], female   DOB: [birth year], 63 y.o.   MRN: [id number]   Subjective:    Patient ID: Tina [name], female    DOB: 1951-05-15, 63 y.o.   MRN: [id number]  Patient presents for F/U leg abscess  patient here to followup leg abscess and cellulitis. She continues to have drainage he states the pain is much improved. She's not had any fever or systemic symptoms. The redness is also going down. She still using warm compresses and she is taking her insulin in the middle of the day there for her blood sugars have been in the 160s.    Review Of Systems:  GEN- denies fatigue, fever, weight loss,weakness, recent illness HEENT- denies eye drainage, change in vision, nasal discharge, CVS- denies chest pain, palpitations RESP- denies SOB, cough, wheeze ABD- denies N/V, change in stools, abd pain Neuro- denies headache, dizziness, syncope, seizure activity       Objective:    BP 138/78  Pulse 78  Temp(Src) 98.4 F (36.9 C) (Oral)  Resp 16  Ht 4\' 10"  (1.473 m)  Wt 189 lb (85.73 kg)  BMI 39.51 kg/m2 GEN- NAD, alert and oriented x3 GEN- NAD, alert and oriented x3 Skin- left inner thigh- 3cm area of induration extending to outer thigh, decreased erythema, no fluctaunce, mild warmth ,decreased cellulitis now only 1cm outside of indurated area EXT- No edema         Assessment & Plan:      Problem List Items Addressed This Visit   Cellulitis and abscess of leg - Primary     Abscess is much improved continue medications I will give her a total 14 days of doxycycline       Note: This dictation was prepared with Dragon dictation along with smaller phrase technology. Any transcriptional errors that result from this process are unintentional.

## 2013-10-12 NOTE — Patient Instructions (Signed)
Continue antibiotics for total of 14 days- new script sent for extra pills Continue soaks F/U if not improving or go to ER if it gets worse

## 2013-10-12 NOTE — Assessment & Plan Note (Signed)
Abscess is much improved continue medications I will give her a total 14 days of doxycycline

## 2013-10-17 ENCOUNTER — Other Ambulatory Visit: Payer: Self-pay | Admitting: Family Medicine

## 2013-10-17 NOTE — Telephone Encounter (Signed)
Refill appropriate and filled per protocol. 

## 2013-10-18 ENCOUNTER — Telehealth: Payer: Self-pay | Admitting: *Deleted

## 2013-10-18 NOTE — Telephone Encounter (Signed)
Okay to refill? 

## 2013-10-18 NOTE — Telephone Encounter (Signed)
Received call from patient.   Requested refill on Hydrocodone.   Ok to refill??  Last office visit 10/12/2013  Last refill 08/10/2013.

## 2013-10-19 ENCOUNTER — Other Ambulatory Visit: Payer: Self-pay | Admitting: *Deleted

## 2013-10-19 MED ORDER — HYDROCODONE-ACETAMINOPHEN 7.5-325 MG PO TABS: 1.0000 | ORAL_TABLET | Freq: Four times a day (QID) | ORAL | Status: DC | PRN

## 2013-10-19 NOTE — Telephone Encounter (Signed)
Prescription printed and patient made aware to come to office to pick up.  

## 2013-11-12 ENCOUNTER — Ambulatory Visit: Payer: Medicare Other | Admitting: Family Medicine

## 2013-11-21 ENCOUNTER — Inpatient Hospital Stay: Payer: Self-pay | Admitting: Physician Assistant

## 2013-11-27 ENCOUNTER — Ambulatory Visit: Payer: Self-pay | Admitting: Family Medicine

## 2013-12-01 ENCOUNTER — Other Ambulatory Visit: Payer: Self-pay | Admitting: *Deleted

## 2013-12-01 DIAGNOSIS — I10 Essential (primary) hypertension: Secondary | ICD-10-CM

## 2013-12-01 DIAGNOSIS — E119 Type 2 diabetes mellitus without complications: Secondary | ICD-10-CM

## 2013-12-01 DIAGNOSIS — E785 Hyperlipidemia, unspecified: Secondary | ICD-10-CM

## 2013-12-18 ENCOUNTER — Encounter: Payer: Self-pay | Admitting: Family Medicine

## 2014-01-11 ENCOUNTER — Encounter: Payer: Self-pay | Admitting: Family Medicine

## 2014-01-11 ENCOUNTER — Ambulatory Visit (INDEPENDENT_AMBULATORY_CARE_PROVIDER_SITE_OTHER): Payer: BC Managed Care – PPO | Admitting: Family Medicine

## 2014-01-11 VITALS — BP 160/110 | HR 74 | Temp 98.7°F | Resp 20 | Ht 59.0 in | Wt 189.0 lb

## 2014-01-11 DIAGNOSIS — I10 Essential (primary) hypertension: Secondary | ICD-10-CM

## 2014-01-11 DIAGNOSIS — I509 Heart failure, unspecified: Secondary | ICD-10-CM

## 2014-01-11 DIAGNOSIS — I5022 Chronic systolic (congestive) heart failure: Secondary | ICD-10-CM

## 2014-01-11 DIAGNOSIS — E785 Hyperlipidemia, unspecified: Secondary | ICD-10-CM

## 2014-01-11 DIAGNOSIS — E119 Type 2 diabetes mellitus without complications: Secondary | ICD-10-CM

## 2014-01-11 DIAGNOSIS — IMO0001 Reserved for inherently not codable concepts without codable children: Secondary | ICD-10-CM

## 2014-01-11 DIAGNOSIS — E669 Obesity, unspecified: Secondary | ICD-10-CM

## 2014-01-11 LAB — COMPLETE METABOLIC PANEL WITH GFR
ALT: 15 U/L (ref 0–35)
AST: 13 U/L (ref 0–37)
Albumin: 3.8 g/dL (ref 3.5–5.2)
Alkaline Phosphatase: 80 U/L (ref 39–117)
BILIRUBIN TOTAL: 0.6 mg/dL (ref 0.2–1.2)
BUN: 12 mg/dL (ref 6–23)
CALCIUM: 8.7 mg/dL (ref 8.4–10.5)
CHLORIDE: 104 meq/L (ref 96–112)
CO2: 26 meq/L (ref 19–32)
CREATININE: 0.85 mg/dL (ref 0.50–1.10)
GFR, EST AFRICAN AMERICAN: 86 mL/min
GFR, EST NON AFRICAN AMERICAN: 74 mL/min
Glucose, Bld: 223 mg/dL — ABNORMAL HIGH (ref 70–99)
Potassium: 3.6 mEq/L (ref 3.5–5.3)
Sodium: 139 mEq/L (ref 135–145)
TOTAL PROTEIN: 6.6 g/dL (ref 6.0–8.3)

## 2014-01-11 LAB — CBC WITH DIFFERENTIAL/PLATELET
Basophils Absolute: 0.1 10*3/uL (ref 0.0–0.1)
Basophils Relative: 1 % (ref 0–1)
EOS ABS: 0.2 10*3/uL (ref 0.0–0.7)
EOS PCT: 2 % (ref 0–5)
HCT: 38.9 % (ref 36.0–46.0)
HEMOGLOBIN: 13.4 g/dL (ref 12.0–15.0)
LYMPHS ABS: 3.7 10*3/uL (ref 0.7–4.0)
LYMPHS PCT: 46 % (ref 12–46)
MCH: 32.3 pg (ref 26.0–34.0)
MCHC: 34.4 g/dL (ref 30.0–36.0)
MCV: 93.7 fL (ref 78.0–100.0)
Monocytes Absolute: 0.6 10*3/uL (ref 0.1–1.0)
Monocytes Relative: 8 % (ref 3–12)
Neutro Abs: 3.4 10*3/uL (ref 1.7–7.7)
Neutrophils Relative %: 43 % (ref 43–77)
Platelets: 210 10*3/uL (ref 150–400)
RBC: 4.15 MIL/uL (ref 3.87–5.11)
RDW: 14.1 % (ref 11.5–15.5)
WBC: 8 10*3/uL (ref 4.0–10.5)

## 2014-01-11 LAB — HEMOGLOBIN A1C
HEMOGLOBIN A1C: 9.5 % — AB (ref <5.7)
Mean Plasma Glucose: 226 mg/dL — ABNORMAL HIGH (ref <117)

## 2014-01-11 LAB — LIPID PANEL
Cholesterol: 213 mg/dL — ABNORMAL HIGH (ref 0–200)
HDL: 46 mg/dL (ref >39)
Total CHOL/HDL Ratio: 4.6 Ratio
Triglycerides: 447 mg/dL — ABNORMAL HIGH (ref <150)

## 2014-01-11 MED ORDER — HYDROCODONE-ACETAMINOPHEN 7.5-325 MG PO TABS: 1.0000 | ORAL_TABLET | Freq: Four times a day (QID) | ORAL | Status: DC | PRN

## 2014-01-11 MED ORDER — CETIRIZINE HCL 10 MG PO TABS: 10.0000 mg | ORAL_TABLET | Freq: Every day | ORAL | Status: DC

## 2014-01-11 MED ORDER — POTASSIUM CHLORIDE ER 10 MEQ PO TBCR: 10.0000 meq | EXTENDED_RELEASE_TABLET | Freq: Two times a day (BID) | ORAL | Status: DC

## 2014-01-11 MED ORDER — FUROSEMIDE 40 MG PO TABS: ORAL_TABLET | ORAL | Status: DC

## 2014-01-11 MED ORDER — BUSPIRONE HCL 15 MG PO TABS: 15.0000 mg | ORAL_TABLET | Freq: Two times a day (BID) | ORAL | Status: DC

## 2014-01-11 MED ORDER — METOPROLOL TARTRATE 100 MG PO TABS: 100.0000 mg | ORAL_TABLET | Freq: Two times a day (BID) | ORAL | Status: DC

## 2014-01-11 MED ORDER — LOSARTAN POTASSIUM 25 MG PO TABS: 25.0000 mg | ORAL_TABLET | Freq: Every morning | ORAL | Status: DC

## 2014-01-11 MED ORDER — PAROXETINE HCL 40 MG PO TABS: 40.0000 mg | ORAL_TABLET | Freq: Every morning | ORAL | Status: DC

## 2014-01-11 MED ORDER — OMEPRAZOLE 20 MG PO CPDR: DELAYED_RELEASE_CAPSULE | ORAL | Status: DC

## 2014-01-11 NOTE — Assessment & Plan Note (Signed)
Increase Lasix to 40 mg twice a day with potassium

## 2014-01-11 NOTE — Assessment & Plan Note (Signed)
No blood pressure medications taken today. Her blood pressure typically runs fairly well I would not change her medications Will does have her take her regular medicines. Her Lasix is also been increased because of the edema

## 2014-01-11 NOTE — Assessment & Plan Note (Signed)
Diabetes is uncontrolled. I will refer her to endocrinology. I will go ahead and start her Invokana 100 mg and see how she does with this- samples given. She did not tolerate metformin. Compliance with medications is been issued as well as her dietary habits

## 2014-01-11 NOTE — Patient Instructions (Addendum)
Increase lasix to 40mg  twice a day , take with potassium  [facility]- Discharge summary  Pain medication refilled Referral to endocrinology Try the Christean Griefnkovana once a day

## 2014-01-11 NOTE — Assessment & Plan Note (Signed)
Hydrocodone refilled.  

## 2014-01-11 NOTE — Progress Notes (Signed)
Patient ID: Tina CanalJudy H Belanger, female   DOB: 09-14-1950, 63 y.o.   MRN: [id number]   Subjective:    Patient ID: Tina CanalJudy H Bridge, female    DOB: 09-14-1950, 63 y.o.   MRN: [id number]  Patient presents for Med check  patient here to follow chronic medical problems. #1 diabetes mellitus she states that her sugars have been running a low high. She's been taking Levemir 50 units twice a day she's also been taking NovoLog 20 units with each meal. Her blood sugars are still 200 to 300s she does not have her meter with her today. Her last A1c was 9.5% she missed her last set of fasting labs.  Fibromyalgia she continues to have severe pain from her fibromyalgia states that she had a bad summer therefore she was unable to keep up with everything. She still uses the pain medication as needed for her fibromyalgia we tried her multiple other medications which she's had side effects to she requests a refill on this today.  She's actually been out of off her medications with the exception of her insulin and her Crestor for the past week. She does get short of breath with little exertion states that she's been swelling more than she typically does. She denies any chest pain.   Review Of Systems:  GEN- denies fatigue, fever, weight loss,weakness, recent illness HEENT- denies eye drainage, change in vision, nasal discharge, CVS- denies chest pain, palpitations RESP-+SOB, cough, wheeze ABD- denies N/V, change in stools, abd pain GU- denies dysuria, hematuria, dribbling, incontinence MSK-+ joint pain, +muscle aches, injury Neuro- denies headache, dizziness, syncope, seizure activity       Objective:    BP 160/110  Pulse 74  Temp(Src) 98.7 F (37.1 C) (Oral)  Resp 20  Ht 4\' 11"  (1.499 m)  Wt 189 lb (85.73 kg)  BMI 38.15 kg/m2 GEN- NAD, alert and oriented x3 , repeat 160/100 HEENT- PERRL, EOMI, non injected sclera, pink conjunctiva, MMM, oropharynx clear Neck- Supple, no tJVD CVS- RRR, no  murmur RESP-CTAB ABD-NABS,soft,NT,ND EXT- 1+ pedal  edema Pulses- Radial, DP- 2+        Assessment & Plan:      Problem List Items Addressed This Visit   HYPERTENSION   Relevant Medications      metoprolol (LOPRESSOR) tablet      losartan (COZAAR) tablet      furosemide (LASIX) tablet   HYPERLIPIDEMIA   Relevant Medications      metoprolol (LOPRESSOR) tablet      losartan (COZAAR) tablet      furosemide (LASIX) tablet   DIABETES MELLITUS, TYPE II - Primary   Relevant Medications      losartan (COZAAR) tablet      Canagliflozin (INVOKANA) 100 MG TABS    Other Visit Diagnoses   Hyperlipidemia        Relevant Medications       metoprolol (LOPRESSOR) tablet       losartan (COZAAR) tablet       furosemide (LASIX) tablet       Note: This dictation was prepared with Dragon dictation along with smaller phrase technology. Any transcriptional errors that result from this process are unintentional.

## 2014-01-15 ENCOUNTER — Other Ambulatory Visit: Payer: Self-pay | Admitting: *Deleted

## 2014-01-15 ENCOUNTER — Encounter: Payer: Self-pay | Admitting: *Deleted

## 2014-01-15 MED ORDER — FENOFIBRATE 48 MG PO TABS: 48.0000 mg | ORAL_TABLET | Freq: Every day | ORAL | Status: DC

## 2014-03-21 ENCOUNTER — Encounter: Payer: Self-pay | Admitting: *Deleted

## 2014-03-29 ENCOUNTER — Ambulatory Visit: Payer: Medicare Other | Admitting: Family Medicine

## 2014-04-03 ENCOUNTER — Ambulatory Visit (INDEPENDENT_AMBULATORY_CARE_PROVIDER_SITE_OTHER): Payer: BC Managed Care – PPO | Admitting: Family Medicine

## 2014-04-03 ENCOUNTER — Encounter: Payer: Self-pay | Admitting: Family Medicine

## 2014-04-03 VITALS — BP 140/78 | HR 64 | Temp 97.6°F | Resp 16 | Ht 59.0 in | Wt 185.0 lb

## 2014-04-03 DIAGNOSIS — R001 Bradycardia, unspecified: Secondary | ICD-10-CM | POA: Insufficient documentation

## 2014-04-03 DIAGNOSIS — J209 Acute bronchitis, unspecified: Secondary | ICD-10-CM | POA: Insufficient documentation

## 2014-04-03 DIAGNOSIS — I10 Essential (primary) hypertension: Secondary | ICD-10-CM

## 2014-04-03 DIAGNOSIS — IMO0002 Reserved for concepts with insufficient information to code with codable children: Secondary | ICD-10-CM

## 2014-04-03 DIAGNOSIS — E1165 Type 2 diabetes mellitus with hyperglycemia: Secondary | ICD-10-CM

## 2014-04-03 MED ORDER — IPRATROPIUM BROMIDE 0.02 % IN SOLN: 0.5000 mg | RESPIRATORY_TRACT | Status: DC | PRN

## 2014-04-03 MED ORDER — LOSARTAN POTASSIUM 50 MG PO TABS: 50.0000 mg | ORAL_TABLET | Freq: Every morning | ORAL | Status: DC

## 2014-04-03 MED ORDER — CANAGLIFLOZIN 100 MG PO TABS: 100.0000 mg | ORAL_TABLET | Freq: Every day | ORAL | Status: DC

## 2014-04-03 MED ORDER — HYDROCODONE-ACETAMINOPHEN 7.5-325 MG PO TABS: 1.0000 | ORAL_TABLET | Freq: Four times a day (QID) | ORAL | Status: DC | PRN

## 2014-04-03 NOTE — Assessment & Plan Note (Signed)
   recent admission, obtain records, atrovent refilled , may need PFT

## 2014-04-03 NOTE — Assessment & Plan Note (Signed)
Most likley cause of dizzy spells and fatigue Will have her check BP and HR at home Decrease metoprolol to 50mg  BID

## 2014-04-03 NOTE — Assessment & Plan Note (Signed)
Increase losartan to 50mg

## 2014-04-03 NOTE — Assessment & Plan Note (Signed)
Restart invokana Continue levemir/novlog Recheck A1C 4 weeks
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
## 2014-04-03 NOTE — Patient Instructions (Signed)
Decrease metoprolol 1/2 tablet twice a day  Restart the invokana Get a blood pressure Cuff Losartan take 2 tablets total of 50mg   Call in 1 week for your blood pressure readings and heart rate Atrovent refilled F/U 4 weeks

## 2014-04-08 ENCOUNTER — Telehealth: Payer: Self-pay | Admitting: Family Medicine

## 2014-04-08 NOTE — Telephone Encounter (Signed)
-----   Message from Durwin Norahristina H Six, LPN sent at 40/98/119111/16/2015  4:42 PM EST ----- Regarding: RE: f/u HR Call placed to patient.   States that she has not been able to pick up a BP cuff.   States that she has been taking decreased dosage, and she has felt better than she has been. Denies any dizziness.   Reports that she will pick up BP Cuff tonight and call with results in the morning.   MD to be made aware.  ----- Message -----    From: Salley ScarletKawanta F [place], MD    Sent: 04/08/2014   1:29 PM      To: Durwin Norahristina H Six, LPN Subject: FW: f/u HR                                     Can you call pt and see what BP and HR has been running I changed to metoprolol dose due to bradycardia ----- Message -----    From: Salley ScarletKawanta F [place], MD    Sent: 04/08/2014      To: Salley ScarletKawanta F [place], MD Subject: f/u HR

## 2014-04-16 ENCOUNTER — Telehealth: Payer: Self-pay | Admitting: Family Medicine

## 2014-04-16 NOTE — Telephone Encounter (Signed)
Call placed to patient. LMTRC.  

## 2014-04-16 NOTE — Telephone Encounter (Signed)
Patient returned call.   States that she has been monitoring BP, but has been doing so at inconsistent times of the day.   Reports that she has been monitoring BP before taking her medications at times, after medications at times, and in the middle of the day at times.   BP monitor reading noted as : 182/106 184/104 147/100 145/96 169/111 150/73 193/96 165/88.  Reports that she is not writing down BP readings or what time or what day the readings are performed.   Advised to make a routine in AM to take HTN medications, and then monitor BP 1 hour after taking medication after resting x5 minutes. Advised to keep copy of log and write down readings for better record.   MD to be made aware.

## 2014-04-16 NOTE — Telephone Encounter (Signed)
Patient is calling to speak with you regarding her blood pressure medication  [phone]

## 2014-04-16 NOTE — Telephone Encounter (Signed)
Noted, pt advised to take meds first then take BP readings, also f/u in office in 1 week

## 2014-04-22 ENCOUNTER — Telehealth: Payer: Self-pay | Admitting: Family Medicine

## 2014-04-22 NOTE — Telephone Encounter (Signed)
Call placed to patient.   Reports that she has been monitoring BP and noting that BP remains elevated. Also reports experiencing episodes of dizziness with high blood pressure.   States that she is monitoring BP with automatic arm cuff and is resting when she takes BP. States that she checks BP throughout day and BP remains elevated.   Last readings are as follows: 197/103 58HR 175/119 71HR  MD please advise.

## 2014-04-22 NOTE — Telephone Encounter (Signed)
Call placed to patient. LMTRC.  

## 2014-04-22 NOTE — Telephone Encounter (Signed)
[phone] Patient would like a call back regarding her blood pressure being really high

## 2014-04-22 NOTE — Telephone Encounter (Signed)
Increase losartan to 100mg  once a day, take 2 of the 50mg , keep appt for this Wed for recheck

## 2014-04-23 NOTE — Telephone Encounter (Signed)
Patient returned call and made aware.

## 2014-04-24 ENCOUNTER — Ambulatory Visit: Payer: Medicare Other | Admitting: Family Medicine

## 2014-05-01 ENCOUNTER — Ambulatory Visit: Payer: Medicare Other | Admitting: Family Medicine

## 2014-05-03 ENCOUNTER — Ambulatory Visit (INDEPENDENT_AMBULATORY_CARE_PROVIDER_SITE_OTHER): Payer: BC Managed Care – PPO | Admitting: Family Medicine

## 2014-05-03 ENCOUNTER — Encounter: Payer: Self-pay | Admitting: Family Medicine

## 2014-05-03 VITALS — BP 136/70 | HR 62 | Temp 98.7°F | Resp 14 | Ht 59.0 in | Wt 183.0 lb

## 2014-05-03 DIAGNOSIS — IMO0002 Reserved for concepts with insufficient information to code with codable children: Secondary | ICD-10-CM

## 2014-05-03 DIAGNOSIS — E785 Hyperlipidemia, unspecified: Secondary | ICD-10-CM

## 2014-05-03 DIAGNOSIS — I10 Essential (primary) hypertension: Secondary | ICD-10-CM

## 2014-05-03 DIAGNOSIS — R001 Bradycardia, unspecified: Secondary | ICD-10-CM

## 2014-05-03 DIAGNOSIS — E1165 Type 2 diabetes mellitus with hyperglycemia: Secondary | ICD-10-CM

## 2014-05-03 LAB — LIPID PANEL
CHOL/HDL RATIO: 4.6 ratio
Cholesterol: 237 mg/dL — ABNORMAL HIGH (ref 0–200)
HDL: 51 mg/dL (ref >39)
LDL CALC: 126 mg/dL — AB (ref 0–99)
Triglycerides: 300 mg/dL — ABNORMAL HIGH (ref <150)
VLDL: 60 mg/dL — ABNORMAL HIGH (ref 0–40)

## 2014-05-03 LAB — CBC WITH DIFFERENTIAL/PLATELET
Basophils Absolute: 0.1 10*3/uL (ref 0.0–0.1)
Basophils Relative: 1 % (ref 0–1)
EOS ABS: 0.1 10*3/uL (ref 0.0–0.7)
EOS PCT: 2 % (ref 0–5)
HCT: 43.7 % (ref 36.0–46.0)
HEMOGLOBIN: 15.1 g/dL — AB (ref 12.0–15.0)
LYMPHS ABS: 2.6 10*3/uL (ref 0.7–4.0)
Lymphocytes Relative: 40 % (ref 12–46)
MCH: 32.8 pg (ref 26.0–34.0)
MCHC: 34.6 g/dL (ref 30.0–36.0)
MCV: 95 fL (ref 78.0–100.0)
MONO ABS: 0.4 10*3/uL (ref 0.1–1.0)
MPV: 11.2 fL (ref 9.4–12.4)
Monocytes Relative: 6 % (ref 3–12)
Neutro Abs: 3.3 10*3/uL (ref 1.7–7.7)
Neutrophils Relative %: 51 % (ref 43–77)
Platelets: 242 10*3/uL (ref 150–400)
RBC: 4.6 MIL/uL (ref 3.87–5.11)
RDW: 14 % (ref 11.5–15.5)
WBC: 6.4 10*3/uL (ref 4.0–10.5)

## 2014-05-03 LAB — BASIC METABOLIC PANEL
BUN: 14 mg/dL (ref 6–23)
CO2: 26 mEq/L (ref 19–32)
CREATININE: 0.79 mg/dL (ref 0.50–1.10)
Calcium: 9.2 mg/dL (ref 8.4–10.5)
Chloride: 100 mEq/L (ref 96–112)
Glucose, Bld: 276 mg/dL — ABNORMAL HIGH (ref 70–99)
Potassium: 4.4 mEq/L (ref 3.5–5.3)
Sodium: 139 mEq/L (ref 135–145)

## 2014-05-03 LAB — HEMOGLOBIN A1C
HEMOGLOBIN A1C: 9.6 % — AB (ref <5.7)
MEAN PLASMA GLUCOSE: 229 mg/dL — AB (ref <117)

## 2014-05-03 MED ORDER — LOSARTAN POTASSIUM 100 MG PO TABS: 100.0000 mg | ORAL_TABLET | Freq: Every morning | ORAL | Status: DC

## 2014-05-03 MED ORDER — HYDROCODONE-ACETAMINOPHEN 7.5-325 MG PO TABS: 1.0000 | ORAL_TABLET | Freq: Four times a day (QID) | ORAL | Status: DC | PRN

## 2014-05-03 NOTE — Assessment & Plan Note (Signed)
Her blood pressure is much improved with losartan 100 mg she is tolerating without any difficulties. I will have her renal function checked today. We will continue the Toprol 50 mg twice a day and the losartan

## 2014-05-03 NOTE — Assessment & Plan Note (Signed)
Her previous Intermedics spells have improved with reduction in the beta blocker her heart rate is now in the 60s consistently we will continue at the current dose for now

## 2014-05-03 NOTE — Progress Notes (Signed)
Patient ID: Tina [name], female   DOB: 28-Aug-1950, 63 y.o.   MRN: [id number]   Subjective:    Patient ID: Tina CanalJudy H [name], female    DOB: 28-Aug-1950, 63 y.o.   MRN: [id number]  Patient presents for F/U BP and Labs  patient here to follow-up blood pressure. Her last visit I had to decrease her metoprolol secondary to bradycardia which was symptomatic she's now on 50 mg twice a day and her symptoms have resolved. Her blood pressure started to elevate therefore had to titrate her losartan up she is now on 200 mg once a day is doing well with this.  She is also here for her fasting labs for diabetes mellitus which is uncontrolled as well as her hyperlipidemia. She did restart Invokana, and her blood sugars now on average about 170 fasting    Review Of Systems:  GEN- denies fatigue, fever, weight loss,weakness, recent illness HEENT- denies eye drainage, change in vision, nasal discharge, CVS- denies chest pain, palpitations RESP- denies SOB, cough, wheeze ABD- denies N/V, change in stools, abd pain GU- denies dysuria, hematuria, dribbling, incontinence MSK- + joint pain, +muscle aches, injury Neuro- denies headache, dizziness, syncope, seizure activity       Objective:    BP 136/70 mmHg  Pulse 62  Temp(Src) 98.7 F (37.1 C) (Oral)  Resp 14  Ht 4\' 11"  (1.499 m)  Wt 183 lb (83.008 kg)  BMI 36.94 kg/m2 GEN- NAD, alert and oriented x3 HEENT- PERRL, EOMI, non injected sclera, pink conjunctiva, MMM, oropharynx clear CVS- RRR, no murmur RESP-CTAB EXT-pedal edema         Assessment & Plan:      Problem List Items Addressed This Visit      Unprioritized   Hyperlipidemia   Relevant Medications      losartan (COZAAR) tablet   Other Relevant Orders      Lipid panel   Essential hypertension    Her blood pressure is much improved with losartan 100 mg she is tolerating without any difficulties. I will have her renal function checked today. We will continue the Toprol 50 mg  twice a day and the losartan    Relevant Medications      losartan (COZAAR) tablet   Diabetes mellitus type II, uncontrolled - Primary    Uncontrolled DM, recheck A1C, CBG improved with invokana    Relevant Medications      losartan (COZAAR) tablet   Other Relevant Orders      Basic metabolic panel      CBC with Differential      Hemoglobin A1c   Bradycardia    Her previous Intermedics spells have improved with reduction in the beta blocker her heart rate is now in the 60s consistently we will continue at the current dose for now       Note: This dictation was prepared with Dragon dictation along with smaller phrase technology. Any transcriptional errors that result from this process are unintentional.

## 2014-05-03 NOTE — Patient Instructions (Signed)
Continue current mediations New prescription for your losartan 100mg  F/U 3 months

## 2014-05-03 NOTE — Assessment & Plan Note (Signed)
Uncontrolled DM, recheck A1C, CBG improved with invokana

## 2014-07-15 ENCOUNTER — Ambulatory Visit (INDEPENDENT_AMBULATORY_CARE_PROVIDER_SITE_OTHER): Payer: Medicare Other | Admitting: Family Medicine

## 2014-07-15 ENCOUNTER — Encounter: Payer: Self-pay | Admitting: Family Medicine

## 2014-07-15 VITALS — BP 138/68 | HR 78 | Temp 98.6°F | Resp 16 | Ht 59.0 in | Wt 185.0 lb

## 2014-07-15 DIAGNOSIS — N3946 Mixed incontinence: Secondary | ICD-10-CM

## 2014-07-15 DIAGNOSIS — N760 Acute vaginitis: Secondary | ICD-10-CM

## 2014-07-15 DIAGNOSIS — IMO0002 Reserved for concepts with insufficient information to code with codable children: Secondary | ICD-10-CM

## 2014-07-15 DIAGNOSIS — L0201 Cutaneous abscess of face: Secondary | ICD-10-CM

## 2014-07-15 DIAGNOSIS — R32 Unspecified urinary incontinence: Secondary | ICD-10-CM | POA: Insufficient documentation

## 2014-07-15 DIAGNOSIS — L03211 Cellulitis of face: Secondary | ICD-10-CM

## 2014-07-15 DIAGNOSIS — E1165 Type 2 diabetes mellitus with hyperglycemia: Secondary | ICD-10-CM

## 2014-07-15 DIAGNOSIS — N8110 Cystocele, unspecified: Secondary | ICD-10-CM

## 2014-07-15 LAB — WET PREP FOR TRICH, YEAST, CLUE
Clue Cells Wet Prep HPF POC: NONE SEEN
Trich, Wet Prep: NONE SEEN

## 2014-07-15 MED ORDER — DOXYCYCLINE HYCLATE 100 MG PO TABS: 100.0000 mg | ORAL_TABLET | Freq: Two times a day (BID) | ORAL | Status: DC

## 2014-07-15 MED ORDER — CLOTRIMAZOLE 1 % EX CREA
1.0000 | TOPICAL_CREAM | Freq: Two times a day (BID) | CUTANEOUS | Status: DC
Start: 2014-07-15 — End: 2014-11-12

## 2014-07-15 MED ORDER — FLUCONAZOLE 150 MG PO TABS: ORAL_TABLET | ORAL | Status: DC

## 2014-07-15 NOTE — Progress Notes (Signed)
Patient ID: Tina CanalJudy H Moore, female   DOB: [birth year], 64 y.o.   MRN: [id number]   Subjective:    Patient ID: Tina CanalJudy H Dohrman, female    DOB: [birth year], 64 y.o.   MRN: [id number]  Patient presents for Skin Irritation Under Neck and Yeast Infection  patient here with vaginal discharge and irritation for the past week or so. She also has had increasing urinary incontinence if she coughs or sneezes she will leak a significant amount of urine this is been going on for a few years but is getting worse. She typically wears depends because of the amount of leakage.  For the past 3 days she has had some bumps beneath her chin which have been tender and swollen she's had some drainage out of one of the lesions. She's not had any fever or difficulty breathing or difficulty swallowing  She states that her blood sugars have been good her blood sugar was 80 last night in 130 this morning. Levemir 50 units, Novolog 20 units    Review Of Systems: per above  GEN- denies fatigue, fever, weight loss,weakness, recent illness HEENT- denies eye drainage, change in vision, nasal discharge, CVS- denies chest pain, palpitations RESP- denies SOB, cough, wheeze ABD- denies N/V, change in stools, abd pain GU- denies dysuria, hematuria, dribbling,+ incontinence MSK-+ joint pain, muscle aches, injury Neuro- denies headache, dizziness, syncope, seizure activity       Objective:    BP 138/68 mmHg  Pulse 78  Temp(Src) 98.6 F (37 C) (Oral)  Resp 16  Ht 4\' 11"  (1.499 m)  Wt 185 lb (83.915 kg)  BMI 37.35 kg/m2 GEN- NAD, alert and oriented x3 HEENT- PERRL, EOMI, non injected sclera, pink conjunctiva, MMM, oropharynx clear Neck- Supple, + large submandibular nodes and shotty Anterior nodes, 3 small pustules palpated on neck 2 open scabs, no drainage, +erythema, no induration CVS- RRR, no murmur RESP-CTAB GU- normal external genitalia,+ erythema of labia, majora and minora, urine leaked upon inspection, bladder  prolapse, vaginal mucosa pink and moist, cervix visualized no growth, no blood form os, + thin  discharge, no CMT, no ovarian masses, uterus normal size        Assessment & Plan:      Problem List Items Addressed This Visit    None    Visit Diagnoses    Vaginitis and vulvovaginitis    -  Primary    Yeast on wet prep, likley secondary to consistant urine incontinence and uncontrolled DM    Relevant Orders    WET PREP FOR TRICH, YEAST, CLUE (Completed)    Cellulitis and abscess of face        Concern for abscess and cellulitis, unable to find lesions to drain as already draining,start doxy 100mg  BID, CBCw diff, no fever, or red flags, recheck in 48 hrs, if no improvement CT of neck    Relevant Orders    CBC with Differential/Platelet       Note: This dictation was prepared with Dragon dictation along with smaller phrase technology. Any transcriptional errors that result from this process are unintentional.

## 2014-07-15 NOTE — Patient Instructions (Signed)
Levemir increase to 55 units Continue Novolog Take antibiotics as prescribed Doxycycline twice a day  Topical yeast cream given Take diflucan by mouth as well  F/U 48 hours

## 2014-07-15 NOTE — Assessment & Plan Note (Signed)
Refer to Urology with incontinence worsening

## 2014-07-15 NOTE — Assessment & Plan Note (Signed)
Uncontrolled DM- Levemir should be 55 units, Novolog 20 units

## 2014-07-16 LAB — CBC WITH DIFFERENTIAL/PLATELET
Basophils Absolute: 0.1 10*3/uL (ref 0.0–0.1)
Basophils Relative: 1 % (ref 0–1)
Eosinophils Absolute: 0.1 10*3/uL (ref 0.0–0.7)
Eosinophils Relative: 1 % (ref 0–5)
HCT: 46.6 % — ABNORMAL HIGH (ref 36.0–46.0)
HEMOGLOBIN: 15.7 g/dL — AB (ref 12.0–15.0)
LYMPHS ABS: 4.1 10*3/uL — AB (ref 0.7–4.0)
LYMPHS PCT: 48 % — AB (ref 12–46)
MCH: 32.5 pg (ref 26.0–34.0)
MCHC: 33.7 g/dL (ref 30.0–36.0)
MCV: 96.5 fL (ref 78.0–100.0)
MPV: 11.2 fL (ref 8.6–12.4)
Monocytes Absolute: 0.6 10*3/uL (ref 0.1–1.0)
Monocytes Relative: 7 % (ref 3–12)
NEUTROS PCT: 43 % (ref 43–77)
Neutro Abs: 3.7 10*3/uL (ref 1.7–7.7)
Platelets: 266 10*3/uL (ref 150–400)
RBC: 4.83 MIL/uL (ref 3.87–5.11)
RDW: 13.6 % (ref 11.5–15.5)
WBC: 8.5 10*3/uL (ref 4.0–10.5)

## 2014-07-17 ENCOUNTER — Encounter: Payer: Self-pay | Admitting: Family Medicine

## 2014-07-17 ENCOUNTER — Ambulatory Visit (INDEPENDENT_AMBULATORY_CARE_PROVIDER_SITE_OTHER): Payer: Medicare Other | Admitting: Family Medicine

## 2014-07-17 VITALS — BP 138/88 | HR 72 | Temp 98.2°F | Resp 16

## 2014-07-17 DIAGNOSIS — L03211 Cellulitis of face: Secondary | ICD-10-CM

## 2014-07-17 DIAGNOSIS — L0201 Cutaneous abscess of face: Secondary | ICD-10-CM

## 2014-07-17 NOTE — Progress Notes (Signed)
Patient ID: Tina CanalJudy H Lewelling, female   DOB: 1950-11-30, 64 y.o.   MRN: [id number]   Subjective:    Patient ID: Tina CanalJudy H Delahunty, female    DOB: 1950-11-30, 64 y.o.   MRN: [id number]  Patient presents for Follow-up  [facility] on cellulitis and abscess of her chin and neck area. She states that she feels much better today she's had very mild drainage from the lesion on her neck. She has not had any fever or chills. No difficulty breathing. The redness has also gone down. Diflucan is also help with her vaginitis    Review Of Systems:  GEN- denies fatigue, fever, weight loss,weakness, recent illness HEENT- denies eye drainage, change in vision, nasal discharge, CVS- denies chest pain, palpitations RESP- denies SOB, cough, wheeze ABD- denies N/V, change in stools, abd pain Neuro- denies headache, dizziness, syncope, seizure activity       Objective:    BP 138/88 mmHg  Pulse 72  Temp(Src) 98.2 F (36.8 C)  Resp 16 GEN- NAD, alert and oriented x3 HEENT- PERRL, EOMI, non injected sclera, pink conjunctiva, MMM, oropharynx clear Neck- Supple, +small submandibular nodes and shotty Anterior nodes, 3 small pustules palpated on neck 2 open scabs, no drainage, miminimal erythema, no induration CVS- RRR, no murmur RESP-CTAB       Assessment & Plan:      Problem List Items Addressed This Visit    None    Visit Diagnoses    Cellulitis and abscess of face    -  Primary    Cellulitis much improved, no abscess to I and D, complete antibiotics as prescribed, she will call for any changes       Note: This dictation was prepared with Dragon dictation along with smaller phrase technology. Any transcriptional errors that result from this process are unintentional.

## 2014-07-17 NOTE — Patient Instructions (Signed)
Continue current medication F/U as previous  

## 2014-07-18 ENCOUNTER — Telehealth: Payer: Self-pay | Admitting: *Deleted

## 2014-07-18 NOTE — Telephone Encounter (Signed)
Patient has appointment on 10/15/14 at 8:45am at [facility]  location with Dr. Retta Dionesahlstedt, left message on vm to pt to return my call

## 2014-07-26 NOTE — Telephone Encounter (Signed)
Left message to return call for appt.

## 2014-07-29 ENCOUNTER — Encounter: Payer: Self-pay | Admitting: Family Medicine

## 2014-07-29 ENCOUNTER — Ambulatory Visit (INDEPENDENT_AMBULATORY_CARE_PROVIDER_SITE_OTHER): Payer: Medicare Other | Admitting: Family Medicine

## 2014-07-29 ENCOUNTER — Ambulatory Visit (HOSPITAL_COMMUNITY)
Admission: RE | Admit: 2014-07-29 | Discharge: 2014-07-29 | Disposition: A | Discharge status: Home / Self Care | Payer: Medicare Other | Source: Ambulatory Visit | Attending: Family Medicine | Admitting: Family Medicine

## 2014-07-29 VITALS — BP 138/72 | HR 78 | Temp 98.5°F | Resp 16 | Ht 59.0 in | Wt 190.0 lb

## 2014-07-29 DIAGNOSIS — R55 Syncope and collapse: Secondary | ICD-10-CM

## 2014-07-29 DIAGNOSIS — R001 Bradycardia, unspecified: Secondary | ICD-10-CM

## 2014-07-29 DIAGNOSIS — Y92010 Kitchen of single-family (private) house as the place of occurrence of the external cause: Secondary | ICD-10-CM | POA: Insufficient documentation

## 2014-07-29 DIAGNOSIS — S069X9A Unspecified intracranial injury with loss of consciousness of unspecified duration, initial encounter: Secondary | ICD-10-CM | POA: Diagnosis not present

## 2014-07-29 DIAGNOSIS — E1165 Type 2 diabetes mellitus with hyperglycemia: Secondary | ICD-10-CM | POA: Diagnosis not present

## 2014-07-29 DIAGNOSIS — IMO0002 Reserved for concepts with insufficient information to code with codable children: Secondary | ICD-10-CM

## 2014-07-29 DIAGNOSIS — X58XXXA Exposure to other specified factors, initial encounter: Secondary | ICD-10-CM | POA: Insufficient documentation

## 2014-07-29 DIAGNOSIS — I10 Essential (primary) hypertension: Secondary | ICD-10-CM | POA: Diagnosis not present

## 2014-07-29 LAB — GLUCOSE, FINGERSTICK (STAT): Glucose, fingerstick: 136 mg/dL — ABNORMAL HIGH (ref 70–99)

## 2014-07-29 MED ORDER — METOPROLOL TARTRATE 25 MG PO TABS
25.0000 mg | ORAL_TABLET | Freq: Two times a day (BID) | ORAL | Status: DC
Start: 2014-07-29 — End: 2014-11-12

## 2014-07-29 MED ORDER — MECLIZINE HCL 25 MG PO TABS: 25.0000 mg | ORAL_TABLET | Freq: Three times a day (TID) | ORAL | Status: DC | PRN

## 2014-07-29 NOTE — Assessment & Plan Note (Signed)
No evidence of orthostatic hypotension I will however decrease beta blocker per above for the bradycardia she may need to be seen by cardiology if the CT scan is negative

## 2014-07-29 NOTE — Progress Notes (Signed)
Patient ID: Tina [name], female   DOB: 1950-12-04, 64 y.o.   MRN: [id number]   Subjective:    Patient ID: Tina CanalJudy H [name], female    DOB: 1950-12-04, 64 y.o.   MRN: [id number]  Patient presents for Dizzy Spells  patient year with worsening dizzy spells over the past 4 days. She's had history of intermittent dizziness before proximally 3 months ago at decrease her metoprolol as she had signs of bradycardia her symptoms then resolved but now recently returns. She states that she became dizzy and actually blacked out and woke up on the floor this was on Friday night she did not wanted to the emergency room because of finances. She denied any chest pain or shortness of breath associated. She denies any tingling or numbness or any new weakness. She is concerned because she has a family history of stroke. She still has a headache as well from when she passed out  She states her blood sugars have been good and that she has been taking her insulin as prescribed  Review Of Systems:  GEN- denies fatigue, fever, weight loss,weakness, recent illness HEENT- denies eye drainage, change in vision, nasal discharge, CVS- denies chest pain, palpitations RESP- denies SOB, cough, wheeze ABD- denies N/V, change in stools, abd pain GU- denies dysuria, hematuria, dribbling, incontinence MSK- denies joint pain, muscle aches, injury Neuro- + headache, +dizziness, +syncope, seizure activity       Objective:    BP 138/72 mmHg  Pulse 78  Temp(Src) 98.5 F (36.9 C) (Oral)  Resp 16  Ht 4\' 11"  (1.499 m)  Wt 190 lb (86.183 kg)  BMI 38.35 kg/m2 GEN- NAD, alert and oriented x3 HEENT- PERRL, EOMI, non injected sclera, pink conjunctiva, MMM, oropharynx clear Neck- Supple, no bruit CVS- RRR, no murmur RESP-CTAB Skin- open scab, no drainage to anterior neck (previous abscess)  Neuro- CNII-XII in tact, no focal deficits EXT- No edema Pulses- Radial  2+  EKG- Sinus Bradycardia HR 50's CBG  136  Orthostatics Lying 138/72 , sitting 142/80  Standing 140/82     Assessment & Plan:      Problem List Items Addressed This Visit      Unprioritized   Diabetes mellitus type II, uncontrolled - Primary   Relevant Orders   Glucose, fingerstick (stat) (Completed)   CBC with Differential/Platelet   Comprehensive metabolic panel    Other Visit Diagnoses    Syncope and collapse        Concern for syncope, ? related to HR, or BP, CBG look okay today in office, high risk for CVA, obtain CT of head today, decrease metoprolol,meclizine given    Relevant Medications    metoprolol tartrate (LOPRESSOR) tablet    Other Relevant Orders    CT Head Wo Contrast    CBC with Differential/Platelet    Comprehensive metabolic panel    EKG 12-Lead (Completed)       Note: This dictation was prepared with Dragon dictation along with smaller phrase technology. Any transcriptional errors that result from this process are unintentional.

## 2014-07-29 NOTE — Assessment & Plan Note (Signed)
CBG improved, no other hypoglycemia symptoms

## 2014-07-29 NOTE — Telephone Encounter (Signed)
Pt called back and is aware of appt coming in May

## 2014-07-29 NOTE — Patient Instructions (Addendum)
Get the CT scan done on your head Decrease metoprolol to 25mg  twice a day - for her blood pressure Further instructions pending results of the CT scan Meclizine for dizziness F/U per previous

## 2014-07-30 ENCOUNTER — Other Ambulatory Visit: Payer: Self-pay | Admitting: *Deleted

## 2014-07-30 DIAGNOSIS — R55 Syncope and collapse: Secondary | ICD-10-CM

## 2014-07-30 DIAGNOSIS — E785 Hyperlipidemia, unspecified: Secondary | ICD-10-CM

## 2014-07-30 LAB — CBC WITH DIFFERENTIAL/PLATELET
BASOS ABS: 0.1 10*3/uL (ref 0.0–0.1)
BASOS PCT: 1 % (ref 0–1)
EOS ABS: 0.1 10*3/uL (ref 0.0–0.7)
EOS PCT: 1 % (ref 0–5)
HCT: 40.5 % (ref 36.0–46.0)
Hemoglobin: 14.2 g/dL (ref 12.0–15.0)
Lymphocytes Relative: 48 % — ABNORMAL HIGH (ref 12–46)
Lymphs Abs: 4.1 10*3/uL — ABNORMAL HIGH (ref 0.7–4.0)
MCH: 33.2 pg (ref 26.0–34.0)
MCHC: 35.1 g/dL (ref 30.0–36.0)
MCV: 94.6 fL (ref 78.0–100.0)
MONO ABS: 0.5 10*3/uL (ref 0.1–1.0)
MPV: 11.2 fL (ref 8.6–12.4)
Monocytes Relative: 6 % (ref 3–12)
NEUTROS ABS: 3.7 10*3/uL (ref 1.7–7.7)
Neutrophils Relative %: 44 % (ref 43–77)
PLATELETS: 241 10*3/uL (ref 150–400)
RBC: 4.28 MIL/uL (ref 3.87–5.11)
RDW: 14 % (ref 11.5–15.5)
WBC: 8.5 10*3/uL (ref 4.0–10.5)

## 2014-07-30 LAB — COMPREHENSIVE METABOLIC PANEL
ALBUMIN: 4 g/dL (ref 3.5–5.2)
ALK PHOS: 69 U/L (ref 39–117)
ALT: 22 U/L (ref 0–35)
AST: 21 U/L (ref 0–37)
BUN: 17 mg/dL (ref 6–23)
CO2: 24 mEq/L (ref 19–32)
Calcium: 9.9 mg/dL (ref 8.4–10.5)
Chloride: 104 mEq/L (ref 96–112)
Creat: 0.86 mg/dL (ref 0.50–1.10)
Glucose, Bld: 119 mg/dL — ABNORMAL HIGH (ref 70–99)
POTASSIUM: 4.2 meq/L (ref 3.5–5.3)
SODIUM: 139 meq/L (ref 135–145)
Total Bilirubin: 0.3 mg/dL (ref 0.2–1.2)
Total Protein: 6.9 g/dL (ref 6.0–8.3)

## 2014-08-01 ENCOUNTER — Telehealth: Payer: Self-pay | Admitting: *Deleted

## 2014-08-01 NOTE — Telephone Encounter (Signed)
Pt has appointment at [facility] on March 11 at 2:30pm arrive 2:15pm for Carotid Duplex US, Left message X2 to return my call

## 2014-08-02 ENCOUNTER — Ambulatory Visit (HOSPITAL_COMMUNITY): Admission: RE | Admit: 2014-08-02 | Payer: Medicare Other | Source: Ambulatory Visit

## 2014-08-02 NOTE — Telephone Encounter (Signed)
Called and left message on vm to return call for appt

## 2014-08-05 ENCOUNTER — Telehealth: Payer: Self-pay | Admitting: *Deleted

## 2014-08-05 NOTE — Telephone Encounter (Signed)
Pt called wanting to let you know that since metoprolol was changed to 25mg  BID she has not have anymore falls or dizziness or blackouts. [facility]

## 2014-08-05 NOTE — Telephone Encounter (Signed)
Pt called back and states saw that I had been trying to contact her in reference to US, I informed her it was scheduled for past Friday and that she can call [facility] and reschedule. Pt states she will call them today.

## 2014-08-08 ENCOUNTER — Ambulatory Visit (HOSPITAL_COMMUNITY)
Admission: RE | Admit: 2014-08-08 | Discharge: 2014-08-08 | Disposition: A | Discharge status: Home / Self Care | Payer: Medicare Other | Source: Ambulatory Visit | Attending: Family Medicine | Admitting: Family Medicine

## 2014-08-08 DIAGNOSIS — Z87891 Personal history of nicotine dependence: Secondary | ICD-10-CM | POA: Diagnosis not present

## 2014-08-08 DIAGNOSIS — I10 Essential (primary) hypertension: Secondary | ICD-10-CM | POA: Diagnosis not present

## 2014-08-08 DIAGNOSIS — Z8673 Personal history of transient ischemic attack (TIA), and cerebral infarction without residual deficits: Secondary | ICD-10-CM | POA: Insufficient documentation

## 2014-08-08 DIAGNOSIS — E785 Hyperlipidemia, unspecified: Secondary | ICD-10-CM | POA: Insufficient documentation

## 2014-08-08 DIAGNOSIS — E119 Type 2 diabetes mellitus without complications: Secondary | ICD-10-CM | POA: Insufficient documentation

## 2014-08-08 DIAGNOSIS — R55 Syncope and collapse: Secondary | ICD-10-CM | POA: Diagnosis present

## 2014-08-08 DIAGNOSIS — I6523 Occlusion and stenosis of bilateral carotid arteries: Secondary | ICD-10-CM | POA: Insufficient documentation

## 2014-08-09 ENCOUNTER — Ambulatory Visit (INDEPENDENT_AMBULATORY_CARE_PROVIDER_SITE_OTHER): Payer: Medicare Other | Admitting: Family Medicine

## 2014-08-09 ENCOUNTER — Encounter: Payer: Self-pay | Admitting: Family Medicine

## 2014-08-09 VITALS — BP 130/78 | HR 76 | Temp 97.8°F | Resp 18 | Ht 59.0 in | Wt 185.0 lb

## 2014-08-09 DIAGNOSIS — I10 Essential (primary) hypertension: Secondary | ICD-10-CM

## 2014-08-09 DIAGNOSIS — I6529 Occlusion and stenosis of unspecified carotid artery: Secondary | ICD-10-CM | POA: Insufficient documentation

## 2014-08-09 DIAGNOSIS — I6501 Occlusion and stenosis of right vertebral artery: Secondary | ICD-10-CM | POA: Diagnosis not present

## 2014-08-09 DIAGNOSIS — E1165 Type 2 diabetes mellitus with hyperglycemia: Secondary | ICD-10-CM

## 2014-08-09 DIAGNOSIS — I6523 Occlusion and stenosis of bilateral carotid arteries: Secondary | ICD-10-CM

## 2014-08-09 DIAGNOSIS — E669 Obesity, unspecified: Secondary | ICD-10-CM

## 2014-08-09 DIAGNOSIS — E785 Hyperlipidemia, unspecified: Secondary | ICD-10-CM

## 2014-08-09 DIAGNOSIS — IMO0002 Reserved for concepts with insufficient information to code with codable children: Secondary | ICD-10-CM

## 2014-08-09 LAB — LIPID PANEL
Cholesterol: 266 mg/dL — ABNORMAL HIGH (ref 0–200)
HDL: 50 mg/dL (ref >=46)
Total CHOL/HDL Ratio: 5.3 Ratio
Triglycerides: 441 mg/dL — ABNORMAL HIGH (ref <150)

## 2014-08-09 NOTE — Progress Notes (Signed)
Patient ID: Tina CanalJudy H [name], female   DOB: [birth year], 64 y.o.   MRN: [id number]   Subjective:    Patient ID: Tina CanalJudy H [name], female    DOB: [birth year], 64 y.o.   MRN: [id number]  Patient presents for F/U  patient here for follow-up for her diabetes hypertension hyperlipidemia. 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 I sent her for carotid Doppler she has greater than 50% stenosis left greater than worse however she also has some abnormal blood flow in the right vertebral artery suggesting occlusive disease.  Diabetes mellitus her blood sugars fasting have been 130 to 140s she states the last night her blood sugar was 78. She's been taking her insulin injections with meals as well as at bedtime as prescribed. She is also eating at least 3 meals a day. Her weight is down 5 pounds in the past 2 weeks. She denies any hypoglycemia symptoms    Review Of Systems:  GEN- denies fatigue, fever, weight loss,weakness, recent illness HEENT- denies eye drainage, change in vision, nasal discharge, CVS- denies chest pain, palpitations RESP- denies SOB, cough, wheeze ABD- denies N/V, change in stools, abd pain GU- denies dysuria, hematuria, dribbling, +incontinence MSK- +joint pain, muscle aches, injury Neuro- denies headache, dizziness, syncope, seizure activity       Objective:    BP 130/78 mmHg  Pulse 76  Temp(Src) 97.8 F (36.6 C) (Oral)  Resp 18  Ht 4\' 11"  (1.499 m)  Wt 185 lb (83.915 kg)  BMI 37.35 kg/m2 GEN- NAD, alert and oriented x3 HEENT- PERRL, EOMI, non injected sclera, pink conjunctiva, MMM, oropharynx clear Neck- Supple, no bruit CVS- RRR, no murmur RESP-CTAB EXT- No edema Pulses- Radial  2+, DP 2+      Assessment & Plan:      Problem List Items Addressed This Visit      Unprioritized   Obesity   Hyperlipidemia   Relevant Orders   Lipid panel   Essential hypertension   Diabetes mellitus type II, uncontrolled - Primary   Relevant Orders   Hemoglobin A1c   Carotid artery stenosis      Note: This dictation was prepared with Dragon dictation along with smaller phrase technology. Any transcriptional errors that result from this process are unintentional.

## 2014-08-09 NOTE — Patient Instructions (Signed)
Continue current doses of insulin Continue alll other medication Referral to vascular doctor for the blockages in the neck F/U 3 months

## 2014-08-09 NOTE — Assessment & Plan Note (Signed)
I'll recheck her A1c she will continue her current dose of insulin the goal is to titrate her to less than 7%

## 2014-08-09 NOTE — Assessment & Plan Note (Signed)
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

## 2014-08-09 NOTE — Assessment & Plan Note (Signed)
She still on Crestor and TriCor she's had severely elevated cholesterol for many years secondary to noncompliance and inability to keep her medications. She now has evidence of buildup in the carotids as well as the vertebral artery.

## 2014-08-09 NOTE — Assessment & Plan Note (Signed)
Her blood pressure looks good with the lower dose of the metoprolol and she is without any symptoms

## 2014-08-10 LAB — HEMOGLOBIN A1C
Hgb A1c MFr Bld: 8.9 % — ABNORMAL HIGH (ref <5.7)
Mean Plasma Glucose: 209 mg/dL — ABNORMAL HIGH (ref <117)

## 2014-08-22 ENCOUNTER — Telehealth: Payer: Self-pay | Admitting: Family Medicine

## 2014-08-22 NOTE — Telephone Encounter (Signed)
[facility]    Patient is calling to say that the rash she saw you for previously is getting worse would like a call back at  [phone]

## 2014-08-22 NOTE — Telephone Encounter (Signed)
Agree with OV 

## 2014-08-22 NOTE — Telephone Encounter (Signed)
Call placed to patient.   Reports that she hs new open areas on her face and is concerned that it may be cellulitis again.   Appointment scheduled for 08/23/2014.

## 2014-08-23 ENCOUNTER — Ambulatory Visit: Payer: Self-pay | Admitting: Family Medicine

## 2014-09-09 ENCOUNTER — Encounter: Payer: Self-pay | Admitting: Vascular Surgery

## 2014-09-10 ENCOUNTER — Telehealth: Payer: Self-pay | Admitting: *Deleted

## 2014-09-10 ENCOUNTER — Encounter: Payer: Medicare Other | Admitting: Vascular Surgery

## 2014-09-10 NOTE — Telephone Encounter (Signed)
-----   Message from Birdie HopesSusan S Bullins sent at 09/10/2014 11:14 AM EDT ----- Tina DusterMichelle from [facility] calling to say that patient did not show up for her appointment  [phone] if any questions

## 2014-09-14 ENCOUNTER — Other Ambulatory Visit: Payer: Self-pay | Admitting: Family Medicine

## 2014-09-16 NOTE — Telephone Encounter (Signed)
Prescription sent to pharmacy.

## 2014-09-30 ENCOUNTER — Encounter: Payer: Medicare Other | Admitting: Surgery

## 2014-10-11 ENCOUNTER — Other Ambulatory Visit: Payer: Self-pay | Admitting: Family Medicine

## 2014-10-15 ENCOUNTER — Encounter: Payer: Medicare Other | Admitting: Vascular Surgery

## 2014-10-15 ENCOUNTER — Ambulatory Visit: Payer: Medicare Other | Admitting: Urology

## 2014-11-09 ENCOUNTER — Other Ambulatory Visit: Payer: Self-pay | Admitting: Family Medicine

## 2014-11-11 ENCOUNTER — Ambulatory Visit: Payer: Medicare Other | Admitting: Family Medicine

## 2014-11-11 NOTE — Telephone Encounter (Signed)
Medication refilled per protocol. 

## 2014-11-12 ENCOUNTER — Encounter: Payer: Self-pay | Admitting: Family Medicine

## 2014-11-12 ENCOUNTER — Ambulatory Visit (INDEPENDENT_AMBULATORY_CARE_PROVIDER_SITE_OTHER): Payer: Medicare Other | Admitting: Family Medicine

## 2014-11-12 VITALS — BP 128/74 | HR 76 | Temp 98.1°F | Resp 12 | Ht 59.0 in | Wt 193.0 lb

## 2014-11-12 DIAGNOSIS — I10 Essential (primary) hypertension: Secondary | ICD-10-CM | POA: Diagnosis not present

## 2014-11-12 DIAGNOSIS — IMO0002 Reserved for concepts with insufficient information to code with codable children: Secondary | ICD-10-CM

## 2014-11-12 DIAGNOSIS — B379 Candidiasis, unspecified: Secondary | ICD-10-CM

## 2014-11-12 DIAGNOSIS — M797 Fibromyalgia: Secondary | ICD-10-CM

## 2014-11-12 DIAGNOSIS — E1165 Type 2 diabetes mellitus with hyperglycemia: Secondary | ICD-10-CM

## 2014-11-12 DIAGNOSIS — E785 Hyperlipidemia, unspecified: Secondary | ICD-10-CM | POA: Diagnosis not present

## 2014-11-12 DIAGNOSIS — E669 Obesity, unspecified: Secondary | ICD-10-CM

## 2014-11-12 DIAGNOSIS — L089 Local infection of the skin and subcutaneous tissue, unspecified: Secondary | ICD-10-CM | POA: Insufficient documentation

## 2014-11-12 MED ORDER — METOPROLOL TARTRATE 25 MG PO TABS: 12.5000 mg | ORAL_TABLET | Freq: Two times a day (BID) | ORAL | Status: DC

## 2014-11-12 MED ORDER — FENOFIBRATE 48 MG PO TABS: ORAL_TABLET | ORAL | Status: DC

## 2014-11-12 MED ORDER — OMEPRAZOLE 20 MG PO CPDR: DELAYED_RELEASE_CAPSULE | ORAL | Status: DC

## 2014-11-12 MED ORDER — CHLORHEXIDINE GLUCONATE 4 % EX LIQD: Freq: Every day | CUTANEOUS | Status: DC | PRN

## 2014-11-12 MED ORDER — PAROXETINE HCL 40 MG PO TABS: 40.0000 mg | ORAL_TABLET | Freq: Every morning | ORAL | Status: DC

## 2014-11-12 MED ORDER — FLUCONAZOLE 150 MG PO TABS: ORAL_TABLET | ORAL | Status: DC

## 2014-11-12 MED ORDER — CLOTRIMAZOLE 1 % EX CREA: 1.0000 "application " | TOPICAL_CREAM | Freq: Two times a day (BID) | CUTANEOUS | Status: DC

## 2014-11-12 MED ORDER — LOSARTAN POTASSIUM 100 MG PO TABS: 100.0000 mg | ORAL_TABLET | Freq: Every morning | ORAL | Status: DC

## 2014-11-12 MED ORDER — CETIRIZINE HCL 10 MG PO TABS: 10.0000 mg | ORAL_TABLET | Freq: Every day | ORAL | Status: DC

## 2014-11-12 MED ORDER — DOXYCYCLINE HYCLATE 100 MG PO TABS: 100.0000 mg | ORAL_TABLET | Freq: Two times a day (BID) | ORAL | Status: DC

## 2014-11-12 MED ORDER — HYDROCODONE-ACETAMINOPHEN 7.5-325 MG PO TABS: 1.0000 | ORAL_TABLET | Freq: Four times a day (QID) | ORAL | Status: DC | PRN

## 2014-11-12 MED ORDER — NYSTATIN 100000 UNIT/GM EX POWD: Freq: Four times a day (QID) | CUTANEOUS | Status: DC

## 2014-11-12 MED ORDER — CANAGLIFLOZIN 100 MG PO TABS: 100.0000 mg | ORAL_TABLET | Freq: Every day | ORAL | Status: DC

## 2014-11-12 NOTE — Assessment & Plan Note (Signed)
Apply topical to leg, no open lesions on chin at this time, I have given doxy for pt to have in case leg worsens

## 2014-11-12 NOTE — Assessment & Plan Note (Signed)
Recurrent yeast infection, given diflucan,  Nystatin powder to intertriginous regions

## 2014-11-12 NOTE — Assessment & Plan Note (Signed)
Controlled on meds

## 2014-11-12 NOTE — Progress Notes (Signed)
Patient ID: KALIOPE QUINONEZ, female   DOB: 01-Dec-1950, 64 y.o.   MRN: [id number]   Subjective:    Patient ID: NANETTE WIRSING, female    DOB: 03/22/1951, 64 y.o.   MRN: [id number]  Patient presents for 3 month F/U  patient to follow-up chronic medical problems. Her last A1c was 8.9%. She is actually been away visiting some family for the past couple months she has gained 8 pounds since her last visit. She states her blood sugar has dropped some therefore she has decreased her Levemir at times down to 40 units is still taking NovoLog 20 units with meals.  She has recurrent yeast infections and still has problems with incontinence but had to miss her appointment with urology she requests a refill on Diflucan. She also continues to get sores on her neck and on her legs that have not improved with topical antibiotics    Review Of Systems:  GEN- denies fatigue, fever, weight loss,weakness, recent illness HEENT- denies eye drainage, change in vision, nasal discharge, CVS- denies chest pain, palpitations RESP- denies SOB, cough, wheeze ABD- denies N/V, change in stools, abd pain GU- denies dysuria, hematuria, dribbling, incontinence MSK- denies joint pain, muscle aches, injury Neuro- denies headache, dizziness, syncope, seizure activity       Objective:    BP 128/74 mmHg  Pulse 76  Temp(Src) 98.1 F (36.7 C) (Oral)  Resp 12  Ht  (1.499 m)  Wt 193 lb (87.544 kg)  BMI 38.96 kg/m2 GEN- NAD, alert and oriented x3 HEENT- PERRL, EOMI, non injected sclera, pink conjunctiva, MMM, oropharynx clear Neck- Supple, no LAD CVS- RRR, no murmur RESP-CTAB Skin-- Left leg, small scab with mild erythema, no induration, no fluctuance, chin, no erythema or open lesions/scabs EXT- No edema Pulses- Radial 2+        Assessment & Plan:      Problem List Items Addressed This Visit    Yeast infection    Recurrent yeast infection, given diflucan,  Nystatin powder to intertriginous regions      Relevant Medications   fluconazole (DIFLUCAN) 150 MG tablet   nystatin (MYCOSTATIN) powder   clotrimazole (LOTRIMIN) 1 % cream   Skin infection    Apply topical to leg, no open lesions on chin at this time, I have given doxy for pt to have in case leg worsens      Relevant Medications   fluconazole (DIFLUCAN) 150 MG tablet   nystatin (MYCOSTATIN) powder   clotrimazole (LOTRIMIN) 1 % cream   Obesity    Work on diet and loss of weight, increase activity      Relevant Medications   canagliflozin (INVOKANA) 100 MG TABS tablet   Hyperlipidemia    Non fasting labs, TG may be a little elevated, continue Tricor and Crestor If still very high, consider injectable cholesterol medication      Relevant Medications   metoprolol tartrate (LOPRESSOR) 25 MG tablet   losartan (COZAAR) 100 MG tablet   fenofibrate (TRICOR) 48 MG tablet   Other Relevant Orders   Lipid panel   Fibromyalgia    norco refilled       Essential hypertension - Primary    Controlled on meds      Relevant Medications   metoprolol tartrate (LOPRESSOR) 25 MG tablet   losartan (COZAAR) 100 MG tablet   fenofibrate (TRICOR) 48 MG tablet   Other Relevant Orders   CBC with Differential/Platelet   Comprehensive metabolic panel   Diabetes mellitus type II,  uncontrolled    Uncontrolled, check A1C, will have her take lower dose of levemir tonight 40 units, until I see A1C and adjust meds to prevent hypoglycemia,      Relevant Medications   losartan (COZAAR) 100 MG tablet   canagliflozin (INVOKANA) 100 MG TABS tablet   Other Relevant Orders   Hemoglobin A1c      Note: This dictation was prepared with Dragon dictation along with smaller phrase technology. Any transcriptional errors that result from this process are unintentional.

## 2014-11-12 NOTE — Assessment & Plan Note (Signed)
Work on diet and loss of weight, increase activity

## 2014-11-12 NOTE — Assessment & Plan Note (Signed)
norco refilled  

## 2014-11-12 NOTE — Patient Instructions (Addendum)
Take 40 units of levemir I will call with lab results and A1C Pain medication refilled Try to keep active, work on loosing the 10lbs that you gained Use hibiclens wash Use topical antibiotic on leg Take antibiotics if things get worse F/U 3 months

## 2014-11-12 NOTE — Assessment & Plan Note (Signed)
Uncontrolled, check A1C, will have her take lower dose of levemir tonight 40 units, until I see A1C and adjust meds to prevent hypoglycemia,

## 2014-11-12 NOTE — Assessment & Plan Note (Signed)
Non fasting labs, TG may be a little elevated, continue Tricor and Crestor If still very high, consider injectable cholesterol medication

## 2014-11-13 LAB — CBC WITH DIFFERENTIAL/PLATELET
Basophils Absolute: 0.1 10*3/uL (ref 0.0–0.1)
Basophils Relative: 1 % (ref 0–1)
Eosinophils Absolute: 0.2 10*3/uL (ref 0.0–0.7)
Eosinophils Relative: 3 % (ref 0–5)
HEMATOCRIT: 41.3 % (ref 36.0–46.0)
Hemoglobin: 13.7 g/dL (ref 12.0–15.0)
LYMPHS PCT: 43 % (ref 12–46)
Lymphs Abs: 2.9 10*3/uL (ref 0.7–4.0)
MCH: 31.7 pg (ref 26.0–34.0)
MCHC: 33.2 g/dL (ref 30.0–36.0)
MCV: 95.6 fL (ref 78.0–100.0)
MONO ABS: 0.4 10*3/uL (ref 0.1–1.0)
MPV: 11.4 fL (ref 8.6–12.4)
Monocytes Relative: 6 % (ref 3–12)
NEUTROS ABS: 3.1 10*3/uL (ref 1.7–7.7)
Neutrophils Relative %: 47 % (ref 43–77)
PLATELETS: 255 10*3/uL (ref 150–400)
RBC: 4.32 MIL/uL (ref 3.87–5.11)
RDW: 13.8 % (ref 11.5–15.5)
WBC: 6.7 10*3/uL (ref 4.0–10.5)

## 2014-11-13 LAB — COMPREHENSIVE METABOLIC PANEL
ALBUMIN: 3.9 g/dL (ref 3.5–5.2)
ALT: 19 U/L (ref 0–35)
AST: 19 U/L (ref 0–37)
Alkaline Phosphatase: 69 U/L (ref 39–117)
BUN: 16 mg/dL (ref 6–23)
CO2: 28 mEq/L (ref 19–32)
Calcium: 8.9 mg/dL (ref 8.4–10.5)
Chloride: 100 mEq/L (ref 96–112)
Creat: 1.12 mg/dL — ABNORMAL HIGH (ref 0.50–1.10)
GLUCOSE: 279 mg/dL — AB (ref 70–99)
POTASSIUM: 3.7 meq/L (ref 3.5–5.3)
SODIUM: 140 meq/L (ref 135–145)
Total Bilirubin: 0.4 mg/dL (ref 0.2–1.2)
Total Protein: 6.9 g/dL (ref 6.0–8.3)

## 2014-11-13 LAB — LIPID PANEL
CHOL/HDL RATIO: 4.3 ratio
Cholesterol: 213 mg/dL — ABNORMAL HIGH (ref 0–200)
HDL: 49 mg/dL (ref >=46)
LDL Cholesterol: 99 mg/dL (ref 0–99)
Triglycerides: 326 mg/dL — ABNORMAL HIGH (ref <150)
VLDL: 65 mg/dL — ABNORMAL HIGH (ref 0–40)

## 2014-11-13 LAB — HEMOGLOBIN A1C
Hgb A1c MFr Bld: 8.8 % — ABNORMAL HIGH (ref <5.7)
Mean Plasma Glucose: 206 mg/dL — ABNORMAL HIGH (ref <117)

## 2014-12-23 ENCOUNTER — Other Ambulatory Visit: Payer: Self-pay | Admitting: Family Medicine

## 2014-12-23 NOTE — Telephone Encounter (Signed)
Refill appropriate and filled per protocol. 

## 2015-01-31 ENCOUNTER — Other Ambulatory Visit: Payer: Self-pay | Admitting: Family Medicine

## 2015-02-03 NOTE — Telephone Encounter (Signed)
Medication refilled per protocol. 

## 2015-02-18 ENCOUNTER — Encounter: Payer: Self-pay | Admitting: Family Medicine

## 2015-02-18 ENCOUNTER — Ambulatory Visit (INDEPENDENT_AMBULATORY_CARE_PROVIDER_SITE_OTHER): Payer: Medicare Other | Admitting: Family Medicine

## 2015-02-18 VITALS — BP 126/78 | HR 82 | Temp 98.6°F | Resp 14 | Ht 59.0 in | Wt 194.0 lb

## 2015-02-18 DIAGNOSIS — I10 Essential (primary) hypertension: Secondary | ICD-10-CM | POA: Diagnosis not present

## 2015-02-18 DIAGNOSIS — E785 Hyperlipidemia, unspecified: Secondary | ICD-10-CM

## 2015-02-18 DIAGNOSIS — E1165 Type 2 diabetes mellitus with hyperglycemia: Secondary | ICD-10-CM | POA: Diagnosis not present

## 2015-02-18 DIAGNOSIS — E669 Obesity, unspecified: Secondary | ICD-10-CM | POA: Diagnosis not present

## 2015-02-18 DIAGNOSIS — IMO0002 Reserved for concepts with insufficient information to code with codable children: Secondary | ICD-10-CM

## 2015-02-18 DIAGNOSIS — I6523 Occlusion and stenosis of bilateral carotid arteries: Secondary | ICD-10-CM | POA: Diagnosis not present

## 2015-02-18 LAB — COMPREHENSIVE METABOLIC PANEL
ALK PHOS: 83 U/L (ref 33–130)
ALT: 20 U/L (ref 6–29)
AST: 15 U/L (ref 10–35)
Albumin: 4.1 g/dL (ref 3.6–5.1)
BILIRUBIN TOTAL: 0.5 mg/dL (ref 0.2–1.2)
BUN: 16 mg/dL (ref 7–25)
CALCIUM: 8.9 mg/dL (ref 8.6–10.4)
CO2: 22 mmol/L (ref 20–31)
Chloride: 107 mmol/L (ref 98–110)
Creat: 0.58 mg/dL (ref 0.50–0.99)
Glucose, Bld: 220 mg/dL — ABNORMAL HIGH (ref 70–99)
Potassium: 4.5 mmol/L (ref 3.5–5.3)
Sodium: 140 mmol/L (ref 135–146)
Total Protein: 7 g/dL (ref 6.1–8.1)

## 2015-02-18 LAB — CBC WITH DIFFERENTIAL/PLATELET
BASOS ABS: 0 10*3/uL (ref 0.0–0.1)
Basophils Relative: 0 % (ref 0–1)
EOS PCT: 2 % (ref 0–5)
Eosinophils Absolute: 0.2 10*3/uL (ref 0.0–0.7)
HCT: 42.4 % (ref 36.0–46.0)
Hemoglobin: 14.3 g/dL (ref 12.0–15.0)
LYMPHS ABS: 2.6 10*3/uL (ref 0.7–4.0)
Lymphocytes Relative: 34 % (ref 12–46)
MCH: 32 pg (ref 26.0–34.0)
MCHC: 33.7 g/dL (ref 30.0–36.0)
MCV: 94.9 fL (ref 78.0–100.0)
MPV: 11 fL (ref 8.6–12.4)
Monocytes Absolute: 0.5 10*3/uL (ref 0.1–1.0)
Monocytes Relative: 7 % (ref 3–12)
Neutro Abs: 4.3 10*3/uL (ref 1.7–7.7)
Neutrophils Relative %: 57 % (ref 43–77)
Platelets: 221 10*3/uL (ref 150–400)
RBC: 4.47 MIL/uL (ref 3.87–5.11)
RDW: 14.2 % (ref 11.5–15.5)
WBC: 7.6 10*3/uL (ref 4.0–10.5)

## 2015-02-18 LAB — LIPID PANEL
CHOLESTEROL: 218 mg/dL — AB (ref 125–200)
HDL: 50 mg/dL (ref >=46)
LDL Cholesterol: 104 mg/dL (ref <130)
TRIGLYCERIDES: 319 mg/dL — AB (ref <150)
Total CHOL/HDL Ratio: 4.4 Ratio (ref <=5.0)
VLDL: 64 mg/dL — AB (ref <30)

## 2015-02-18 MED ORDER — HYDROCODONE-ACETAMINOPHEN 7.5-325 MG PO TABS: 1.0000 | ORAL_TABLET | Freq: Four times a day (QID) | ORAL | Status: DC | PRN

## 2015-02-18 NOTE — Assessment & Plan Note (Signed)
Discussed dietary changes and need for weight loss, no significant changes have been made

## 2015-02-18 NOTE — Patient Instructions (Addendum)
Decrease meal time Novolog to 18 units  Continue Levemir at 40 units  We will call lab results Plan to recheck carotids in March F/U 3 months

## 2015-02-18 NOTE — Assessment & Plan Note (Signed)
Uncontrolled but improving, with hypoglycemic symptoms, during day, will decrease meal time coverage to 18 units

## 2015-02-18 NOTE — Progress Notes (Signed)
Patient ID: Tina [name], female   DOB: 1950-05-29, 64 y.o.   MRN: [id number]   Subjective:    Patient ID: Tina [name], female    DOB: 1951-02-04, 64 y.o.   MRN: [id number]  Patient presents for 3 month F/U  patient here to follow-up medications. Diabetes mellitus her last A1c was 8.8% which was an improvement. She states that she is giving her insulin as prescribed Levemir 40 units at bedtime and 20 units of NovoLog with each meal. She has had hypoglycemia symptoms when her blood sugar gets about 100 she feels shaky in week. This typically happens after breakfast and after dinner when she takes her insulin. She denies skipping any meals. She still is a lot of carbs into much junk food. She is actually gained 1 pound since her last visit.    medications reviewed. She did request a refill on her pain medication which she uses sparingly for her chronic back pain fibromyalgia   she tells me that she is still and unable to go to the vascular surgeon about her carotid arteries she is unable to afford to go at this time   medications reviewed  Review Of Systems:  GEN- denies fatigue, fever, weight loss,weakness, recent illness HEENT- denies eye drainage, change in vision, nasal discharge, CVS- denies chest pain, palpitations RESP- denies SOB, cough, wheeze ABD- denies N/V, change in stools, abd pain GU- denies dysuria, hematuria, dribbling, incontinence MSK- + joint pain, muscle aches, injury Neuro- denies headache, dizziness, syncope, seizure activity       Objective:    BP 126/78 mmHg  Pulse 82  Temp(Src) 98.6 F (37 C) (Oral)  Resp 14  Ht  (1.499 m)  Wt 194 lb (87.998 kg)  BMI 39.16 kg/m2 GEN- NAD, alert and oriented x3 HEENT- PERRL, EOMI, non injected sclera, pink conjunctiva, MMM, oropharynx clear Neck- Supple, no thyromegaly CVS- RRR, no murmur RESP-CTAB ABD-NABS,soft,NT,ND EXT- No edema Pulses- Radial, DP- 2+        Assessment & Plan:      Problem List  Items Addressed This Visit    None      Note: This dictation was prepared with Dragon dictation along with smaller phrase technology. Any transcriptional errors that result from this process are unintentional.

## 2015-02-18 NOTE — Assessment & Plan Note (Signed)
Patient unable to afford to go to the vascular surgeon at this time she will need a repeat ultrasound in 6 months and we'll try to keep her risk factors under better control though this has been difficult with her uncontrolled diabetes she is also on max dose of Crestor along with TriCor

## 2015-02-19 LAB — HEMOGLOBIN A1C
HEMOGLOBIN A1C: 8.7 % — AB (ref <5.7)
Mean Plasma Glucose: 203 mg/dL — ABNORMAL HIGH (ref <117)

## 2015-02-24 ENCOUNTER — Encounter: Payer: Self-pay | Admitting: *Deleted

## 2015-02-25 ENCOUNTER — Telehealth: Payer: Self-pay | Admitting: Family Medicine

## 2015-02-25 NOTE — Telephone Encounter (Signed)
Pt was very upset that no one had called her with her lab results.  I told her per nurse's note that it states phone number disconnected.  Verified both numbers on file and she said they were her numbers and have not been disconnected.?????  Told her about her lab results per provider recommendations.

## 2015-03-05 ENCOUNTER — Encounter: Payer: Self-pay | Admitting: Family Medicine

## 2015-03-05 ENCOUNTER — Ambulatory Visit (INDEPENDENT_AMBULATORY_CARE_PROVIDER_SITE_OTHER): Payer: Medicare Other | Admitting: Family Medicine

## 2015-03-05 VITALS — BP 134/74 | HR 82 | Temp 97.7°F | Resp 16 | Ht 59.0 in | Wt 194.0 lb

## 2015-03-05 DIAGNOSIS — B958 Unspecified staphylococcus as the cause of diseases classified elsewhere: Secondary | ICD-10-CM

## 2015-03-05 DIAGNOSIS — J0100 Acute maxillary sinusitis, unspecified: Secondary | ICD-10-CM

## 2015-03-05 DIAGNOSIS — L089 Local infection of the skin and subcutaneous tissue, unspecified: Secondary | ICD-10-CM | POA: Diagnosis not present

## 2015-03-05 MED ORDER — DOXYCYCLINE HYCLATE 100 MG PO TABS: 100.0000 mg | ORAL_TABLET | Freq: Two times a day (BID) | ORAL | Status: DC

## 2015-03-05 NOTE — Progress Notes (Signed)
Patient ID: Tina [name], female   DOB: 04-17-1951, 64 y.o.   MRN: [id number]   Subjective:    Patient ID: Tina CanalJudy H Frix, female    DOB: 04-17-1951, 64 y.o.   MRN: [id number]  Patient presents for B Ear Pain and Cellulitis  patient here with bilateral ear pain and sinus pressure or drainage worsening of the past week or so. She also has a recurrent staph infection on her neck. We treated these in the past with doxycycline but this one has been draining the past couple days. She's not had any fever but just felt bad. She states her blood sugar this morning was 163 and she is injecting her insulin as prescribed.    Review Of Systems:  GEN- + fatigue, Denies fever, weight loss,weakness, recent illness HEENT- denies eye drainage, change in vision, +nasal discharge, CVS- denies chest pain, palpitations RESP- denies SOB, cough, wheeze ABD- denies N/V, change in stools, abd pain Neuro- denies headache, dizziness, syncope, seizure activity       Objective:    BP 134/74 mmHg  Pulse 82  Temp(Src) 97.7 F (36.5 C) (Oral)  Resp 16  Ht 4\' 11"  (1.499 m)  Wt 194 lb (87.998 kg)  BMI 39.16 kg/m2 GEN- NAD, alert and oriented x3 HEENT- PERRL, EOMI, non injected sclera, pink conjunctiva, MMM, oropharynx clear , TM clear bilat no effusion,  + maxillary sinus tenderness, inflammed turbinates,  + Nasal drainage  Neck- Supple, anterior  LAD CVS- RRR, no murmur RESP-CTAB Skin- dime size boil right antierior neck, mild TTP- mild pus draining Pulses- Radial 2+          Assessment & Plan:      Problem List Items Addressed This Visit    None    Visit Diagnoses    Staphylococcal infection of skin    -  Primary    Wound culture done, start doxycycline, clean soap and water, no fever,     Relevant Orders    Wound culture    Acute maxillary sinusitis, recurrence not specified        Mucinex, nasal saline, antibiotics per above, add claritin     Relevant Medications    doxycycline  (VIBRA-TABS) 100 MG tablet       Note: This dictation was prepared with Dragon dictation along with smaller phrase technology. Any transcriptional errors that result from this process are unintentional.

## 2015-03-05 NOTE — Patient Instructions (Addendum)
Mucinex Twice a day  Take antibiotics Zyrtec  F/u as previous

## 2015-03-08 LAB — WOUND CULTURE
GRAM STAIN: NONE SEEN
Gram Stain: NONE SEEN
Gram Stain: NONE SEEN

## 2015-04-07 ENCOUNTER — Ambulatory Visit: Payer: Medicare Other | Admitting: Family Medicine

## 2015-04-15 ENCOUNTER — Other Ambulatory Visit: Payer: Self-pay | Admitting: Family Medicine

## 2015-04-15 NOTE — Telephone Encounter (Signed)
Refill appropriate and filled per protocol. 

## 2015-05-14 ENCOUNTER — Ambulatory Visit (INDEPENDENT_AMBULATORY_CARE_PROVIDER_SITE_OTHER): Payer: Medicare Other | Admitting: Physician Assistant

## 2015-05-14 ENCOUNTER — Encounter: Payer: Self-pay | Admitting: Physician Assistant

## 2015-05-14 VITALS — BP 124/76 | HR 60 | Temp 97.9°F | Resp 18 | Wt 192.0 lb

## 2015-05-14 DIAGNOSIS — J019 Acute sinusitis, unspecified: Secondary | ICD-10-CM | POA: Diagnosis not present

## 2015-05-14 MED ORDER — PREDNISONE 20 MG PO TABS: ORAL_TABLET | ORAL | Status: DC

## 2015-05-14 MED ORDER — IPRATROPIUM BROMIDE 0.02 % IN SOLN: 0.5000 mg | RESPIRATORY_TRACT | Status: DC | PRN

## 2015-05-14 MED ORDER — AZITHROMYCIN 250 MG PO TABS: ORAL_TABLET | ORAL | Status: DC

## 2015-05-14 NOTE — Progress Notes (Signed)
Patient ID: KARCYN MENN MRN: [id number], DOB: 1951/01/29, 64 y.o. Date of Encounter: 05/14/2015, 11:32 AM    Chief Complaint:  Chief Complaint  Patient presents with  . c/o sinus infection     HPI: 64 y.o. year old white female presents with above. Says that she has been having nasal congestion and drainage down her throat. Says that now she is having a lot of pressure behind her ears to the point that her ears feel stopped up. Says that yesterday she got to the point that the pain in her sinuses was severe and was so painful that she took some pain medicine and had to come in today. Says that she has felt no congestion in her chest. No fever or chills.     Home Meds:   Outpatient Prescriptions Prior to Visit  Medication Sig Dispense Refill  . aspirin EC 81 MG tablet Take 81 mg by mouth at bedtime.    . cetirizine (ZYRTEC) 10 MG tablet TAKE 1 TABLET BY MOUTH EVERY DAY 90 tablet 1  . clotrimazole (LOTRIMIN) 1 % cream Apply 1 application topically 2 (two) times daily. 30 g 0  . fenofibrate (TRICOR) 48 MG tablet TAKE 1 TABLET BY MOUTH EVERY DAY 30 tablet 0  . furosemide (LASIX) 40 MG tablet Take 1 tablet twice a day 180 tablet 3  . HYDROcodone-acetaminophen (NORCO) 7.5-325 MG per tablet Take 1 tablet by mouth every 6 (six) hours as needed for moderate pain. 45 tablet 0  . insulin detemir (LEVEMIR) 100 UNIT/ML injection Inject 40-50 Units into the skin 2 (two) times daily.     . insulin lispro (HUMALOG) 100 UNIT/ML injection Inject 20 Units into the skin 3 (three) times daily before meals.    . INVOKANA 100 MG TABS tablet TAKE 1 TABLET BY MOUTH EVERY DAY 30 tablet 0  . ipratropium (ATROVENT) 0.06 % nasal spray Place 2 sprays into both nostrils 4 (four) times daily as needed for rhinitis.    Marland Kitchen losartan (COZAAR) 50 MG tablet Take 50 mg by mouth daily.    . metoprolol tartrate (LOPRESSOR) 25 MG tablet Take 0.5 tablets (12.5 mg total) by mouth 2 (two) times daily. 60 tablet 3  .  nystatin (MYCOSTATIN) powder Apply topically 4 (four) times daily. 60 g 2  . Omega-3 Fatty Acids (FISH OIL) 1000 MG CAPS Take 2 capsules by mouth 2 (two) times daily.    Marland Kitchen omeprazole (PRILOSEC) 20 MG capsule TAKE 1 CAPSULE BY MOUTH EVERY DAY 90 capsule 0  . PARoxetine (PAXIL) 40 MG tablet TAKE 1 TABLET BY MOUTH EVERY MORNING 90 tablet 3  . potassium chloride (K-DUR) 10 MEQ tablet Take 1 tablet (10 mEq total) by mouth 2 (two) times daily. 180 tablet 3  . rosuvastatin (CRESTOR) 40 MG tablet Take 40 mg by mouth every evening.    Lauris Poag INSULIN SYRINGE 31G X 5/16" 1 ML MISC USE AS DIRECTED TO INJECT 100 each 2  . ipratropium (ATROVENT) 0.02 % nebulizer solution Take 2.5 mLs (0.5 mg total) by nebulization every 4 (four) hours as needed for wheezing or shortness of breath. 75 mL 3  . fluconazole (DIFLUCAN) 150 MG tablet Take 1 tablet repeat in 3 days (Patient not taking: Reported on 05/14/2015) 2 tablet 1  . doxycycline (VIBRA-TABS) 100 MG tablet Take 1 tablet (100 mg total) by mouth 2 (two) times daily. 14 tablet 0   No facility-administered medications prior to visit.    Allergies:  Allergies  Allergen  Reactions  . Cefuroxime Axetil Other (See Comments)    REACTION: unspecified  . Fentanyl And Related Other (See Comments)    Durgesic Patch: unknown reaction  . Niaspan [Niacin Er] Other (See Comments)    Burning all over  . Sulfur Nausea And Vomiting  . Vasotec [Enalapril] Cough  . Welchol [Colesevelam Hcl] Other (See Comments)    Muscle aches and joint pains  . Lyrica [Pregabalin] Rash and Other (See Comments)    Blistering rash around same time as starting drug  . Penicillins Rash      Review of Systems: See HPI for pertinent ROS. All other ROS negative.    Physical Exam: Blood pressure 124/76, pulse 60, temperature 97.9 F (36.6 C), temperature source Oral, resp. rate 18, weight 192 lb (87.091 kg)., Body mass index is 38.76 kg/(m^2). General:  Obese white female Appears in no  acute distress. HEENT: Normocephalic, atraumatic, eyes without discharge, sclera non-icteric, nares are without discharge. Bilateral auditory canals clear, TM's are without perforation. TMs slightly dull bilaterally.  Oral cavity moist, posterior pharynx without exudate, erythema, peritonsillar abscess. There is tenderness with percussion to frontal and maxillary sinuses. Neck: Supple. No thyromegaly. No lymphadenopathy. Lungs: Clear bilaterally to auscultation without wheezes, rales, or rhonchi. Breathing is unlabored. Heart: Regular rhythm. No murmurs, rubs, or gallops. Msk:  Strength and tone normal for age. Extremities/Skin: Warm and dry. Neuro: Alert and oriented X 3. Moves all extremities spontaneously. Gait is normal. CNII-XII grossly in tact. Psych:  Responds to questions appropriately with a normal affect.     ASSESSMENT AND PLAN:  64 y.o. year old female with  1. Acute sinusitis, recurrence not specified, unspecified location Discussed with her the fact that she has diabetes and that prednisone will increase her BS but would give her relief of her symptoms.  She does want to go ahead with prednisone. She is to be extra careful with her carbohydrate intake. She is to take the antibiotic as directed and prednisone as directed --If symptoms do not resolve upon completion of these, she needs to follow-up. - azithromycin (ZITHROMAX) 250 MG tablet; Day 1: Take 2 daily. Days 2-5: Take 1 daily.  Dispense: 6 tablet; Refill: 0 - predniSONE (DELTASONE) 20 MG tablet; Take 3 daily for 2 days, then 2 daily for 2 days, then 1 daily for 2 days.  Dispense: 12 tablet; Refill: 0   Signed, [address] Beth ParklawnDixon, [place], [facility] 05/14/2015 11:32 AM

## 2015-05-20 ENCOUNTER — Ambulatory Visit: Payer: Medicare Other | Admitting: Family Medicine

## 2015-05-30 ENCOUNTER — Other Ambulatory Visit: Payer: Self-pay | Admitting: Family Medicine

## 2015-05-30 NOTE — Telephone Encounter (Signed)
Refill appropriate and filled per protocol. 

## 2015-06-03 ENCOUNTER — Ambulatory Visit (INDEPENDENT_AMBULATORY_CARE_PROVIDER_SITE_OTHER): Payer: Medicare HMO | Admitting: Family Medicine

## 2015-06-03 ENCOUNTER — Encounter: Payer: Self-pay | Admitting: Family Medicine

## 2015-06-03 VITALS — BP 130/78 | HR 78 | Temp 98.4°F | Resp 14 | Ht 59.0 in | Wt 190.0 lb

## 2015-06-03 DIAGNOSIS — I5022 Chronic systolic (congestive) heart failure: Secondary | ICD-10-CM

## 2015-06-03 DIAGNOSIS — R300 Dysuria: Secondary | ICD-10-CM

## 2015-06-03 DIAGNOSIS — E1165 Type 2 diabetes mellitus with hyperglycemia: Secondary | ICD-10-CM

## 2015-06-03 DIAGNOSIS — Z794 Long term (current) use of insulin: Secondary | ICD-10-CM | POA: Diagnosis not present

## 2015-06-03 DIAGNOSIS — IMO0001 Reserved for inherently not codable concepts without codable children: Secondary | ICD-10-CM

## 2015-06-03 DIAGNOSIS — E785 Hyperlipidemia, unspecified: Secondary | ICD-10-CM | POA: Diagnosis not present

## 2015-06-03 DIAGNOSIS — N3946 Mixed incontinence: Secondary | ICD-10-CM

## 2015-06-03 DIAGNOSIS — I10 Essential (primary) hypertension: Secondary | ICD-10-CM

## 2015-06-03 LAB — URINALYSIS, MICROSCOPIC ONLY
CASTS: NONE SEEN [LPF]
Crystals: NONE SEEN [HPF]
Yeast: NONE SEEN [HPF]

## 2015-06-03 LAB — CBC WITH DIFFERENTIAL/PLATELET
BASOS PCT: 0 % (ref 0–1)
Basophils Absolute: 0 10*3/uL (ref 0.0–0.1)
EOS PCT: 2 % (ref 0–5)
Eosinophils Absolute: 0.1 10*3/uL (ref 0.0–0.7)
HEMATOCRIT: 44.7 % (ref 36.0–46.0)
HEMOGLOBIN: 15.5 g/dL — AB (ref 12.0–15.0)
Lymphocytes Relative: 47 % — ABNORMAL HIGH (ref 12–46)
Lymphs Abs: 3.4 10*3/uL (ref 0.7–4.0)
MCH: 33.1 pg (ref 26.0–34.0)
MCHC: 34.7 g/dL (ref 30.0–36.0)
MCV: 95.5 fL (ref 78.0–100.0)
MONO ABS: 0.5 10*3/uL (ref 0.1–1.0)
MONOS PCT: 7 % (ref 3–12)
MPV: 11.4 fL (ref 8.6–12.4)
Neutro Abs: 3.2 10*3/uL (ref 1.7–7.7)
Neutrophils Relative %: 44 % (ref 43–77)
Platelets: 191 10*3/uL (ref 150–400)
RBC: 4.68 MIL/uL (ref 3.87–5.11)
RDW: 13.9 % (ref 11.5–15.5)
WBC: 7.3 10*3/uL (ref 4.0–10.5)

## 2015-06-03 LAB — URINALYSIS, ROUTINE W REFLEX MICROSCOPIC
Bilirubin Urine: NEGATIVE
LEUKOCYTES UA: NEGATIVE
NITRITE: NEGATIVE
Protein, ur: NEGATIVE
Specific Gravity, Urine: 1.02 (ref 1.001–1.035)
pH: 6 (ref 5.0–8.0)

## 2015-06-03 LAB — LIPID PANEL
CHOL/HDL RATIO: 6.5 ratio — AB (ref <=5.0)
CHOLESTEROL: 319 mg/dL — AB (ref 125–200)
HDL: 49 mg/dL (ref >=46)
TRIGLYCERIDES: 641 mg/dL — AB (ref <150)

## 2015-06-03 LAB — COMPREHENSIVE METABOLIC PANEL
ALBUMIN: 4.2 g/dL (ref 3.6–5.1)
ALT: 20 U/L (ref 6–29)
AST: 13 U/L (ref 10–35)
Alkaline Phosphatase: 66 U/L (ref 33–130)
BUN: 17 mg/dL (ref 7–25)
CALCIUM: 8.9 mg/dL (ref 8.6–10.4)
CHLORIDE: 99 mmol/L (ref 98–110)
CO2: 23 mmol/L (ref 20–31)
Creat: 0.72 mg/dL (ref 0.50–0.99)
Glucose, Bld: 319 mg/dL — ABNORMAL HIGH (ref 70–99)
Potassium: 4.3 mmol/L (ref 3.5–5.3)
SODIUM: 134 mmol/L — AB (ref 135–146)
Total Bilirubin: 0.8 mg/dL (ref 0.2–1.2)
Total Protein: 7.1 g/dL (ref 6.1–8.1)

## 2015-06-03 MED ORDER — HYDROCODONE-ACETAMINOPHEN 7.5-325 MG PO TABS: 1.0000 | ORAL_TABLET | Freq: Four times a day (QID) | ORAL | Status: DC | PRN

## 2015-06-03 MED ORDER — FLUCONAZOLE 150 MG PO TABS: 150.0000 mg | ORAL_TABLET | Freq: Every day | ORAL | Status: DC

## 2015-06-03 MED ORDER — CIPROFLOXACIN HCL 500 MG PO TABS: 500.0000 mg | ORAL_TABLET | Freq: Two times a day (BID) | ORAL | Status: DC

## 2015-06-03 NOTE — Patient Instructions (Addendum)
Take Toprol 12.5mg  twice a day  The losartan is 100mg  once a day  Referral to urology Continue all other medications Take antibiotics and yeast medication F/U 3 months

## 2015-06-03 NOTE — Assessment & Plan Note (Signed)
Currently stable on lasix

## 2015-06-03 NOTE — Assessment & Plan Note (Signed)
Refer back to urology for treatment

## 2015-06-03 NOTE — Progress Notes (Signed)
Patient ID: Tina [name], female   DOB: Feb 16, 1951, 65 y.o.   MRN: [id number]    Subjective:    Patient ID: Tina CanalJudy H [name], female    DOB: Feb 16, 1951, 65 y.o.   MRN: [id number]  Patient presents for 3 month F/U; Dysuria; and Medication Management  Pt here to F/U chronic medical problems, last A1C 8.7% states injecting Levemir 50 units and Humalog 18 units with meals. No hypoglycemia did not bring meter with her today   Dysuria and vaginal itching for past few days, feels like her UTI again, wants to return to urology, has chronic incontinence as well. No fever , no N/V associated. Note treated for sinus infection 2 weeks ago   Fibromyaglia, chronic back pain- needs refill on pain med     Review Of Systems:  GEN- denies fatigue, fever, weight loss,weakness, recent illness HEENT- denies eye drainage, change in vision, nasal discharge, CVS- denies chest pain, palpitations RESP- denies SOB, cough, wheeze ABD- denies N/V, change in stools, abd pain GU- + dysuria, hematuria, dribbling,+ incontinence MSK- denies joint pain, muscle aches, injury Neuro- denies headache, dizziness, syncope, seizure activity       Objective:    BP 130/78 mmHg  Pulse 78  Temp(Src) 98.4 F (36.9 C) (Oral)  Resp 14  Ht 4\' 11"  (1.499 m)  Wt 190 lb (86.183 kg)  BMI 38.35 kg/m2 GEN- NAD, alert and oriented x3 HEENT- PERRL, EOMI, non injected sclera, pink conjunctiva, MMM, oropharynx clear CVS- RRR, no murmur RESP-CTAB ABD-NABS,soft,NT, ND, No CVA tenderness  EXT- No edema Pulses- Radial- 2+        Assessment & Plan:      Problem List Items Addressed This Visit    Urinary incontinence    Refer back to urology for treatment      Relevant Orders   Ambulatory referral to Urology   Hyperlipidemia   Relevant Orders   Lipid panel   Essential hypertension    Well controlled, no change to meds She should be on losartan 100mg  And Toprol 12.5mg  BID, seems she has been taking 25mg  BID We will  verify with pharmacy      Relevant Orders   CBC with Differential/Platelet   Comprehensive metabolic panel   Diabetes mellitus type II, uncontrolled (HCC)    Recheck A1C, goal , 8%, Continue LEVEMIR 50 units and Humolog 18 units with each meal  On ARB and crestor  Declines flu shot      Relevant Orders   Hemoglobin A1c   CHF (congestive heart failure) (HCC)    Currently stable on lasix       Other Visit Diagnoses    Dysuria    -  Primary    Will send urine for culture, treat based on history Cipro x 3 days and recurrent UTI, also given diflucan.     Relevant Orders    Urinalysis, Routine w reflex microscopic ([facility]) (Completed)    Urine culture       Note: This dictation was prepared with Dragon dictation along with smaller phrase technology. Any transcriptional errors that result from this process are unintentional.

## 2015-06-03 NOTE — Assessment & Plan Note (Signed)
Recheck A1C, goal , 8%, Continue LEVEMIR 50 units and Humolog 18 units with each meal  On ARB and crestor  Declines flu shot

## 2015-06-03 NOTE — Assessment & Plan Note (Addendum)
Well controlled, no change to meds She should be on losartan 100mg  And Toprol 12.5mg  BID, ? seems she has been taking 25mg  BID We will verify with pharmacy---- Pharmacy shows she has correct meds Toprol 1/2 tab (12.5mg ) BID and losartan 100mg , pt notified of this

## 2015-06-04 ENCOUNTER — Other Ambulatory Visit: Payer: Self-pay | Admitting: Family Medicine

## 2015-06-04 LAB — HEMOGLOBIN A1C
Hgb A1c MFr Bld: 9.4 % — ABNORMAL HIGH (ref <5.7)
MEAN PLASMA GLUCOSE: 223 mg/dL — AB (ref <117)

## 2015-06-04 LAB — URINE CULTURE

## 2015-06-04 NOTE — Telephone Encounter (Signed)
Refill appropriate and filled per protocol. 

## 2015-06-06 ENCOUNTER — Telehealth: Payer: Self-pay | Admitting: *Deleted

## 2015-06-06 NOTE — Telephone Encounter (Signed)
Pt has appt scheduled with Dr. Annabell HowellsWrenn urologist at [facility] [place] on 06/13/15 at 9:15am, left message to return to pt for appt information

## 2015-06-09 NOTE — Telephone Encounter (Signed)
Pt aware of appt.

## 2015-06-13 ENCOUNTER — Ambulatory Visit (INDEPENDENT_AMBULATORY_CARE_PROVIDER_SITE_OTHER): Payer: Medicare HMO | Admitting: Urology

## 2015-06-13 DIAGNOSIS — N3946 Mixed incontinence: Secondary | ICD-10-CM | POA: Diagnosis not present

## 2015-06-13 DIAGNOSIS — N3642 Intrinsic sphincter deficiency (ISD): Secondary | ICD-10-CM | POA: Diagnosis not present

## 2015-06-23 ENCOUNTER — Ambulatory Visit (INDEPENDENT_AMBULATORY_CARE_PROVIDER_SITE_OTHER): Payer: Medicare HMO | Admitting: Family Medicine

## 2015-06-23 ENCOUNTER — Encounter: Payer: Self-pay | Admitting: Family Medicine

## 2015-06-23 ENCOUNTER — Ambulatory Visit (HOSPITAL_COMMUNITY)
Admission: RE | Admit: 2015-06-23 | Discharge: 2015-06-23 | Disposition: A | Discharge status: Home / Self Care | Payer: Medicare HMO | Source: Ambulatory Visit | Attending: Family Medicine | Admitting: Family Medicine

## 2015-06-23 VITALS — BP 128/78 | HR 62 | Temp 98.9°F | Resp 16 | Ht 59.0 in | Wt 186.0 lb

## 2015-06-23 DIAGNOSIS — I82811 Embolism and thrombosis of superficial veins of right lower extremities: Secondary | ICD-10-CM

## 2015-06-23 DIAGNOSIS — M25561 Pain in right knee: Secondary | ICD-10-CM | POA: Diagnosis not present

## 2015-06-23 DIAGNOSIS — R2241 Localized swelling, mass and lump, right lower limb: Secondary | ICD-10-CM

## 2015-06-23 MED ORDER — RIVAROXABAN 15 MG PO TABS: 15.0000 mg | ORAL_TABLET | Freq: Two times a day (BID) | ORAL | Status: DC

## 2015-06-23 MED ORDER — APIXABAN 5 MG PO TABS: ORAL_TABLET | ORAL | Status: DC

## 2015-06-23 NOTE — Patient Instructions (Signed)
We will call with [place] results F/U as previous

## 2015-06-23 NOTE — Addendum Note (Signed)
Addended by: Milinda Antis F on: 06/23/2015 02:48 PM   Modules accepted: Orders, Medications

## 2015-06-23 NOTE — Progress Notes (Signed)
Patient ID: Tina [name], female   DOB: Jan 03, 1951, 65 y.o.   MRN: [id number]    Subjective:    Patient ID: Tina [name], female    DOB: 08/28/1950, 65 y.o.   MRN: [id number]  Patient presents for R Leg Knot  Pt here with a knot on her right lower leg. She states yesterday she noticed a painful knot that came up. She has pain when she walks or she touches it.  Pains goes from calf down to ankle. She's not had any drainage no redness. No known injury. No previous history of any blood clots. She's not been taking her medicines on a regular basis and also admits that she has not been taking her insulin as prescribed. States she is stressed over daughter who is a drug addict    Review Of Systems:  GEN- denies fatigue, fever, weight loss,weakness, recent illness HEENT- denies eye drainage, change in vision, nasal discharge, CVS- denies chest pain, palpitations RESP- denies SOB, cough, wheeze ABD- denies N/V, change in stools, abd pain GU- denies dysuria, hematuria, dribbling, incontinence MSK- + joint pain, muscle aches, injury Neuro- denies headache, dizziness, syncope, seizure activity       Objective:    BP 128/78 mmHg  Pulse 62  Temp(Src) 98.9 F (37.2 C) (Oral)  Resp 16  Ht  (1.499 m)  Wt 186 lb (84.369 kg)  BMI 37.55 kg/m2 GEN- NAD, alert and oriented x3 CVS- RRR, no murmur RESP-CTAB EXT- No edema , quarter size palpable mass of right lower leg, TTP, no warmth, no varicose veins in region  Pulses- Radial, DP- 2+  ULTRASOUND REPORT:  Calf Veins: Characterization of the calf veins is limited by overlying soft tissue swelling. There is thrombosis of the superficial branch of the greater saphenous vein within the right calf. This superficial venous thrombosis extends from calf to ankle and is noncompressible.      Assessment & Plan:      Problem List Items Addressed This Visit    Pain in joint, lower leg   Relevant Orders   US Venous Img Lower Unilateral  Right (Completed)    Other Visit Diagnoses    Acute superficial venous thrombosis of lower extremity, right    -  Primary     US shows Superficial thrombus of greater saphenous, extending down to ankle, with her risk factors will place on anti-coagulation for 3 months, send to [facility] for evaluation, hold ASA. Discussed importance of compliance with meds     Relevant Medications    apixaban (ELIQUIS) 5 MG TABS tablet    Other Relevant Orders    US Venous Img Lower Unilateral Right (Completed)    Ambulatory referral to Vascular Surgery       Note: This dictation was prepared with Dragon dictation along with smaller phrase technology. Any transcriptional errors that result from this process are unintentional.

## 2015-06-23 NOTE — Progress Notes (Signed)
eliquis pt unable to afford due to deductible and we did not have samples so we will give Xarelto  BID for 21 days, then start  daily

## 2015-06-24 ENCOUNTER — Telehealth: Payer: Self-pay | Admitting: Family Medicine

## 2015-06-24 NOTE — Telephone Encounter (Signed)
Pt requests a call back from Dr. Deirdre Peer nurse regarding a blood clot that she has in her leg.  [phone]

## 2015-06-24 NOTE — Telephone Encounter (Signed)
Call placed to patient.   Inquired as to if she could take Hydrocodone for pain in leg with clot.   Advised that she can.

## 2015-07-02 ENCOUNTER — Encounter: Payer: Self-pay | Admitting: Vascular Surgery

## 2015-07-03 ENCOUNTER — Encounter: Payer: Self-pay | Admitting: Vascular Surgery

## 2015-07-03 ENCOUNTER — Ambulatory Visit (INDEPENDENT_AMBULATORY_CARE_PROVIDER_SITE_OTHER): Payer: Medicare HMO | Admitting: Vascular Surgery

## 2015-07-03 VITALS — BP 119/68 | HR 56 | Temp 98.3°F | Resp 18 | Ht 60.0 in | Wt 193.0 lb

## 2015-07-03 DIAGNOSIS — I83899 Varicose veins of unspecified lower extremities with other complications: Secondary | ICD-10-CM | POA: Insufficient documentation

## 2015-07-03 DIAGNOSIS — I83891 Varicose veins of right lower extremities with other complications: Secondary | ICD-10-CM | POA: Diagnosis not present

## 2015-07-03 NOTE — Progress Notes (Signed)
[facility] [place]  Patient name: Tina [name] MRN: [id number] DOB: Feb 26, 1951 Sex: female  REASON FOR CONSULT:  Valuation of venous pathology  HPI: Tina [name] is a 65 y.o. female, who is  Seen today for discussion of right leg superficial thrombophlebitis. She had the known history of some mild swelling and mild varicosities in her right leg. She noted roughly 2 weeks ago do have a firm area on her medial distal calf above her medial malleolus. She underwent noninvasive vascular laboratory study on 1 32,017 and this showed clot in her great saphenous vein in the right calf. There was no evidence of no evidence of calf vein DVT. She had no prior history of venous thrombosis no history of pulmonary embolus. She does have a diagnosis of fibromyalgia and therefore has chronic lower extremity discomfort. She is diabetic but does not have any prior history of tissue loss. She does report a history of TIA about a year ago and workup included duplex which I have for review as well. She does have a prior left neck incision related to cervical disc disease surgery  Past Medical History  Diagnosis Date  . Hyperlipidemia   . Hypertension   . Diabetes mellitus   . Depression   . Fibromyalgia   . GERD (gastroesophageal reflux disease)   . CHF (congestive heart failure) (HCC)   . Superficial thrombophlebitis     right leg    Family History  Problem Relation Age of Onset  . Hypertension Mother   . Hyperlipidemia Mother   . Heart disease Mother   . Stroke Sister   . Hypertension Sister   . Hyperlipidemia Sister   . Heart disease Sister     SOCIAL HISTORY: Social History   Social History  . Marital Status: Married    Spouse Name: N/A  . Number of Children: N/A  . Years of Education: N/A   Occupational History  . Not on file.   Social History Main Topics  . Smoking status: Former Smoker    Quit date: 05/07/2010  . Smokeless tobacco: Never Used  .  Alcohol Use: No  . Drug Use: No  . Sexual Activity: Not on file   Other Topics Concern  . Not on file   Social History Narrative    Allergies  Allergen Reactions  . Cefuroxime Axetil Other (See Comments)    REACTION: unspecified  . Fentanyl And Related Other (See Comments)    Durgesic Patch: unknown reaction  . Niaspan [Niacin Er] Other (See Comments)    Burning all over  . Sulfur Nausea And Vomiting  . Vasotec [Enalapril] Cough  . Welchol [Colesevelam Hcl] Other (See Comments)    Muscle aches and joint pains  . Lyrica [Pregabalin] Rash and Other (See Comments)    Blistering rash around same time as starting drug  . Penicillins Rash    Current Outpatient Prescriptions  Medication Sig Dispense Refill  . cetirizine (ZYRTEC) 10 MG tablet TAKE 1 TABLET BY MOUTH EVERY DAY 90 tablet 1  . clotrimazole (LOTRIMIN) 1 % cream Apply 1 application topically 2 (two) times daily. 30 g 0  . fenofibrate (TRICOR) 48 MG tablet TAKE 1 TABLET BY MOUTH EVERY DAY 30 tablet 0  . furosemide (LASIX) 40 MG tablet Take 1 tablet twice a day 180 tablet 3  . HYDROcodone-acetaminophen (NORCO) 7.5-325 MG tablet Take 1 tablet by mouth every 6 (six) hours as needed for moderate pain. 45 tablet 0  .  insulin detemir (LEVEMIR) 100 UNIT/ML injection Inject 40-50 Units into the skin 2 (two) times daily.     . insulin lispro (HUMALOG) 100 UNIT/ML injection Inject 20 Units into the skin 3 (three) times daily before meals.    . INVOKANA 100 MG TABS tablet TAKE 1 TABLET BY MOUTH EVERY DAY 30 tablet 0  . ipratropium (ATROVENT) 0.02 % nebulizer solution Take 2.5 mLs (0.5 mg total) by nebulization every 4 (four) hours as needed for wheezing or shortness of breath. 75 mL 3  . ipratropium (ATROVENT) 0.06 % nasal spray Place 2 sprays into both nostrils 4 (four) times daily as needed for rhinitis.    Marland Kitchen losartan (COZAAR) 100 MG tablet TAKE 1 TABLET BY MOUTH EVERY DAY 30 tablet 0  . metoprolol tartrate (LOPRESSOR) 25 MG tablet  Take 0.5 tablets (12.5 mg total) by mouth 2 (two) times daily. 60 tablet 3  . nystatin (MYCOSTATIN) powder Apply topically 4 (four) times daily. 60 g 2  . Omega-3 Fatty Acids (FISH OIL) 1000 MG CAPS Take 2 capsules by mouth 2 (two) times daily.    Marland Kitchen omeprazole (PRILOSEC) 20 MG capsule TAKE 1 CAPSULE BY MOUTH EVERY DAY 90 capsule 0  . PARoxetine (PAXIL) 40 MG tablet TAKE 1 TABLET BY MOUTH EVERY MORNING 90 tablet 3  . potassium chloride (K-DUR) 10 MEQ tablet Take 1 tablet (10 mEq total) by mouth 2 (two) times daily. 180 tablet 3  . Rivaroxaban (XARELTO) 15 MG TABS tablet Take 1 tablet (15 mg total) by mouth 2 (two) times daily with a meal. (Patient taking differently: Take 10 mg by mouth 2 (two) times daily with a meal. ) 42 tablet 0  . rosuvastatin (CRESTOR) 40 MG tablet Take 40 mg by mouth every evening.    Lauris Poag INSULIN SYRINGE 31G X 5/16" 1 ML MISC USE AS DIRECTED TO INJECT 100 each 2  . aspirin EC 81 MG tablet Take 81 mg by mouth at bedtime. Reported on 07/03/2015     No current facility-administered medications for this visit.    REVIEW OF SYSTEMS:  [X]  denotes positive finding, [ ]  denotes negative finding Cardiac  Comments:  Chest pain or chest pressure:    Shortness of breath upon exertion:    Short of breath when lying flat:    Irregular heart rhythm:        Vascular    Pain in calf, thigh, or hip brought on by ambulation:    Pain in feet at night that wakes you up from your sleep:     Blood clot in your veins:    Leg swelling:         Pulmonary    Oxygen at home:    Productive cough:     Wheezing:         Neurologic    Sudden weakness in arms or legs:     Sudden numbness in arms or legs:     Sudden onset of difficulty speaking or slurred speech:    Temporary loss of vision in one eye:     Problems with dizziness:         Gastrointestinal    Blood in stool:     Vomited blood:         Genitourinary    Burning when urinating:     Blood in urine:          Psychiatric    Major depression:         Hematologic  Bleeding problems:    Problems with blood clotting too easily:        Skin    Rashes or ulcers:        Constitutional    Fever or chills:      PHYSICAL EXAM: Filed Vitals:   07/03/15 0935  BP: 119/68  Pulse: 56  Temp: 98.3 F (36.8 C)  TempSrc: Oral  Resp: 18  Height: 5' (1.524 m)  Weight: 193 lb (87.544 kg)  SpO2: 94%    GENERAL: The patient is a well-nourished female, in no acute distress. The vital signs are documented above. CARDIAC: There is a regular rate and rhythm.  VASCULAR:  2+ radial and 2+ dorsalis pedis pulses bilaterally. Carotid arteries without bruits bilaterally PULMONARY: There is good air exchange bilaterally without wheezing or rales. ABDOMEN: Soft and non-tender with normal pitched bowel sounds.  MUSCULOSKELETAL: There are no major deformities or cyanosis. NEUROLOGIC: No focal weakness or paresthesias are detected. SKIN: There are no ulcers or rashes noted. PSYCHIATRIC: The patient has a normal affect.  she does have some firmness and tenderness over the medial aspect of her right calf  DATA:   I reviewed her duplex 06/23/2015 discussed with the patient as well. Again this shows no evidence of DVT and does have some superficial phlebitis in her saphenous vein and calf  MEDICAL ISSUES:  I discussed the significance of this at length with patient. I I do not feel that she has any indication for being on anticoagulation and have recommended that she discontinue her Xaralto. She will resume Advil for discomfort. She will continue with elevation for symptom relief as she continues to resolve her superficial thrombophlebitis. I think this is not put her any increased risk for pulmonary embolus or DVT. She will notify should she develop any worsening swelling. Otherwise after appropriate for a daily aspirin therapy and is continuing her oral anticoagulant.   Regarding her carotid disease. Her carotid  duplex from March 2016 showed less than 50% narrowing on both right and left carotid system. I would not recommend any follow-up of this are repeat duplex unless she should develop new neurologic symptoms. She was relieved this discussion will see [place] again on as-needed basis   Nohealani Medinger [facility] [employer]: [phone]

## 2015-07-25 ENCOUNTER — Ambulatory Visit: Payer: Medicare HMO | Admitting: Urology

## 2015-08-01 ENCOUNTER — Ambulatory Visit (INDEPENDENT_AMBULATORY_CARE_PROVIDER_SITE_OTHER): Payer: Medicare HMO | Admitting: Family Medicine

## 2015-08-01 ENCOUNTER — Encounter: Payer: Self-pay | Admitting: Family Medicine

## 2015-08-01 VITALS — BP 128/76 | HR 60 | Temp 99.6°F | Resp 20 | Wt 187.0 lb

## 2015-08-01 DIAGNOSIS — IMO0001 Reserved for inherently not codable concepts without codable children: Secondary | ICD-10-CM

## 2015-08-01 DIAGNOSIS — N3946 Mixed incontinence: Secondary | ICD-10-CM

## 2015-08-01 DIAGNOSIS — I83891 Varicose veins of right lower extremities with other complications: Secondary | ICD-10-CM

## 2015-08-01 DIAGNOSIS — Z794 Long term (current) use of insulin: Secondary | ICD-10-CM

## 2015-08-01 DIAGNOSIS — R21 Rash and other nonspecific skin eruption: Secondary | ICD-10-CM

## 2015-08-01 DIAGNOSIS — E1165 Type 2 diabetes mellitus with hyperglycemia: Secondary | ICD-10-CM | POA: Diagnosis not present

## 2015-08-01 MED ORDER — METOPROLOL TARTRATE 25 MG PO TABS: 12.5000 mg | ORAL_TABLET | Freq: Two times a day (BID) | ORAL | Status: DC

## 2015-08-01 MED ORDER — CLOTRIMAZOLE-BETAMETHASONE 1-0.05 % EX CREA: 1.0000 "application " | TOPICAL_CREAM | Freq: Two times a day (BID) | CUTANEOUS | Status: DC

## 2015-08-01 MED ORDER — LOSARTAN POTASSIUM 100 MG PO TABS: 100.0000 mg | ORAL_TABLET | Freq: Every day | ORAL | Status: DC

## 2015-08-01 MED ORDER — FENOFIBRATE 48 MG PO TABS: 48.0000 mg | ORAL_TABLET | Freq: Every day | ORAL | Status: DC

## 2015-08-01 MED ORDER — HYDROCODONE-ACETAMINOPHEN 7.5-325 MG PO TABS: 1.0000 | ORAL_TABLET | Freq: Four times a day (QID) | ORAL | Status: DC | PRN

## 2015-08-01 MED ORDER — ROSUVASTATIN CALCIUM 40 MG PO TABS: 40.0000 mg | ORAL_TABLET | Freq: Every evening | ORAL | Status: DC

## 2015-08-01 NOTE — Patient Instructions (Addendum)
Take the invokana  Use lotrisone as prescribed  We will look into Pfizer program  Use cmrea twice a day  Continue your insulin  F/U as oprevious

## 2015-08-01 NOTE — Progress Notes (Signed)
Patient ID: Tina [name], female   DOB: 11-01-1950, 65 y.o.   MRN: [id number]   Subjective:    Patient ID: Tina CanalJudy H [name], female    DOB: 11-01-1950, 65 y.o.   MRN: [id number]  Patient presents for breaking out on scalp and Medication Refill  patient here with multiple concerns. She states that she had a bad [place] and she refuses to go back for the urodynamic study. She does not want any follow-up for her bladder incontinence at this time.   Diabetes mellitus or diabetes is uncontrolled last A1c 9.4% she can not afford her  Andreas [place]  She is in the donut hole currently. She is taking her insulin 50 units of Levemir and 18 units of her Humalog   She continues to have episodes of rash which she breaks out on her arms occasionally her face she thinks it might be her nerves. She's not had any pustules like a previous staph infections on her chin but more itching and a few scabs. She is not using anything on the skin. She currently has a few on her shoulders and one on her nose which she has picked at.    Review Of Systems:  GEN- denies fatigue, fever, weight loss,weakness, recent illness HEENT- denies eye drainage, change in vision, nasal discharge, CVS- denies chest pain, palpitations RESP- denies SOB, cough, wheeze ABD- denies N/V, change in stools, abd pain GU- denies dysuria, hematuria, dribbling, incontinence MSK-+joint pain, muscle aches, injury Neuro- denies headache, dizziness, syncope, seizure activity       Objective:    BP 128/76 mmHg  Pulse 60  Temp(Src) 99.6 F (37.6 C) (Oral)  Resp 20  Wt 187 lb (84.823 kg) GEN- NAD, alert and oriented x3 HEENT- PERRL, EOMI, non injected sclera, pink conjunctiva, MMM, oropharynx clear Neck- Supple, no LAD  CVS- RRR, no murmur RESP-CTAB Skin- few scabbed erythematous maculopapular lesions on bilat shoulder, scab on left nares, no draiange, no discrete abscess, no vesicles ,s cab lesion behind  left ear EXT- superficial clot palpated right lower leg  Pulses- Radial, DP- 2+        Assessment & Plan:      Problem List Items Addressed This Visit    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  for her superficial clot      Relevant Medications   metoprolol tartrate (LOPRESSOR) 25 MG tablet   losartan (COZAAR) 100 MG tablet   rosuvastatin (CRESTOR) 40 MG tablet   fenofibrate (TRICOR) 48 MG tablet   Urinary incontinence     She declines further evaluation by urology at this time. They do recommend urodynamics and a sling to be placed      Diabetes mellitus type II, uncontrolled (HCC)     Uncontrolled diabetes she is still taking her insulin as prescribed. We have provided samples of the invokana see if we didn't get this with the patient assistance program for her.  stress importance of medication compliance.      Relevant Medications   losartan (COZAAR) 100 MG tablet   rosuvastatin (CRESTOR) 40 MG tablet    Other Visit Diagnoses    Rash and nonspecific skin eruption        unclear cause of recurrence, but she picks at them, so difficult to tell, given lotrisone, recommend dermatology but she can not afford, no bed bugs or scabies  Note: This dictation was prepared with Dragon dictation along with smaller phrase technology. Any transcriptional errors that result from this process are unintentional.

## 2015-08-01 NOTE — Assessment & Plan Note (Signed)
She declines further evaluation by urology at this time. They do recommend urodynamics and a sling to be placed

## 2015-08-01 NOTE — Assessment & Plan Note (Signed)
Uncontrolled diabetes she is still taking her insulin as prescribed. We have provided samples of the invokana see if we didn't get this with the patient assistance program for her.  stress importance of medication compliance.

## 2015-08-01 NOTE — Assessment & Plan Note (Addendum)
Reviewed vascular note no further anticoagulation needed.  for her superficial clot

## 2015-08-02 ENCOUNTER — Other Ambulatory Visit: Payer: Self-pay | Admitting: Family Medicine

## 2015-08-04 ENCOUNTER — Telehealth: Payer: Self-pay | Admitting: *Deleted

## 2015-08-04 MED ORDER — CANAGLIFLOZIN 100 MG PO TABS: 100.0000 mg | ORAL_TABLET | Freq: Every day | ORAL | Status: DC

## 2015-08-04 NOTE — Telephone Encounter (Signed)
Refill appropriate and filled per protocol. 

## 2015-08-04 NOTE — Telephone Encounter (Signed)
Application began. Requires patient to complete part of form. LMTRC.

## 2015-08-04 NOTE — Telephone Encounter (Signed)
-----   Message from Salley ScarletKawanta F [place], MD sent at 08/01/2015  5:00 PM EST ----- Regarding: Medication assistance  See if pt qualifies for Invokana medication assistance program She is in Donut hole

## 2015-08-05 NOTE — Telephone Encounter (Signed)
Patient returned call and made aware.   Requested to have assistance program information mailed to patient.

## 2015-08-06 ENCOUNTER — Encounter: Payer: Self-pay | Admitting: Family Medicine

## 2015-08-11 ENCOUNTER — Telehealth: Payer: Self-pay | Admitting: Family Medicine

## 2015-08-11 MED ORDER — CLOTRIMAZOLE 1 % EX CREA: 1.0000 "application " | TOPICAL_CREAM | Freq: Two times a day (BID) | CUTANEOUS | Status: DC

## 2015-08-11 NOTE — Telephone Encounter (Signed)
Pt is requesting a refill on Clotrimazole cream for a yeast infection. [facility] [phone]

## 2015-08-11 NOTE — Telephone Encounter (Signed)
Prescription sent to pharmacy.

## 2015-09-02 ENCOUNTER — Telehealth: Payer: Self-pay | Admitting: Family Medicine

## 2015-09-02 ENCOUNTER — Ambulatory Visit: Payer: Medicare Other | Admitting: Family Medicine

## 2015-09-02 NOTE — Telephone Encounter (Signed)
Pt is requesting samples of Invokana [phone]

## 2015-09-02 NOTE — Telephone Encounter (Signed)
Patient can pick up during visit.

## 2015-09-19 ENCOUNTER — Ambulatory Visit (INDEPENDENT_AMBULATORY_CARE_PROVIDER_SITE_OTHER): Payer: Medicare HMO | Admitting: Family Medicine

## 2015-09-19 ENCOUNTER — Encounter: Payer: Self-pay | Admitting: Family Medicine

## 2015-09-19 VITALS — BP 134/80 | HR 88 | Temp 98.8°F | Resp 14 | Ht 60.0 in | Wt 191.0 lb

## 2015-09-19 DIAGNOSIS — J302 Other seasonal allergic rhinitis: Secondary | ICD-10-CM | POA: Diagnosis not present

## 2015-09-19 DIAGNOSIS — E785 Hyperlipidemia, unspecified: Secondary | ICD-10-CM | POA: Diagnosis not present

## 2015-09-19 DIAGNOSIS — IMO0001 Reserved for inherently not codable concepts without codable children: Secondary | ICD-10-CM

## 2015-09-19 DIAGNOSIS — I10 Essential (primary) hypertension: Secondary | ICD-10-CM | POA: Diagnosis not present

## 2015-09-19 DIAGNOSIS — E669 Obesity, unspecified: Secondary | ICD-10-CM

## 2015-09-19 DIAGNOSIS — Z794 Long term (current) use of insulin: Secondary | ICD-10-CM | POA: Diagnosis not present

## 2015-09-19 DIAGNOSIS — E1165 Type 2 diabetes mellitus with hyperglycemia: Secondary | ICD-10-CM

## 2015-09-19 LAB — CBC WITH DIFFERENTIAL/PLATELET
BASOS ABS: 74 {cells}/uL (ref 0–200)
Basophils Relative: 1 %
EOS PCT: 2 %
Eosinophils Absolute: 148 cells/uL (ref 15–500)
HCT: 42.7 % (ref 35.0–45.0)
HEMOGLOBIN: 14.5 g/dL (ref 12.0–15.0)
LYMPHS ABS: 3034 {cells}/uL (ref 850–3900)
Lymphocytes Relative: 41 %
MCH: 32.7 pg (ref 27.0–33.0)
MCHC: 34 g/dL (ref 32.0–36.0)
MCV: 96.2 fL (ref 80.0–100.0)
MPV: 11 fL (ref 7.5–12.5)
Monocytes Absolute: 518 cells/uL (ref 200–950)
Monocytes Relative: 7 %
NEUTROS PCT: 49 %
Neutro Abs: 3626 cells/uL (ref 1500–7800)
Platelets: 234 10*3/uL (ref 140–400)
RBC: 4.44 MIL/uL (ref 3.80–5.10)
RDW: 13.5 % (ref 11.0–15.0)
WBC: 7.4 10*3/uL (ref 3.8–10.8)

## 2015-09-19 LAB — COMPREHENSIVE METABOLIC PANEL
ALBUMIN: 4.3 g/dL (ref 3.6–5.1)
ALT: 24 U/L (ref 6–29)
AST: 19 U/L (ref 10–35)
Alkaline Phosphatase: 80 U/L (ref 33–130)
BILIRUBIN TOTAL: 0.5 mg/dL (ref 0.2–1.2)
BUN: 17 mg/dL (ref 7–25)
CALCIUM: 9.9 mg/dL (ref 8.6–10.4)
CHLORIDE: 99 mmol/L (ref 98–110)
CO2: 28 mmol/L (ref 20–31)
Creat: 0.89 mg/dL (ref 0.50–0.99)
Glucose, Bld: 221 mg/dL — ABNORMAL HIGH (ref 70–99)
Potassium: 4.8 mmol/L (ref 3.5–5.3)
SODIUM: 136 mmol/L (ref 135–146)
Total Protein: 7.1 g/dL (ref 6.1–8.1)

## 2015-09-19 LAB — LIPID PANEL
CHOL/HDL RATIO: 4.4 ratio (ref <=5.0)
CHOLESTEROL: 232 mg/dL — AB (ref 125–200)
HDL: 53 mg/dL (ref >=46)
LDL CALC: 103 mg/dL (ref <130)
Triglycerides: 380 mg/dL — ABNORMAL HIGH (ref <150)
VLDL: 76 mg/dL — AB (ref <30)

## 2015-09-19 LAB — HEMOGLOBIN A1C
Hgb A1c MFr Bld: 9 % — ABNORMAL HIGH (ref <5.7)
Mean Plasma Glucose: 212 mg/dL

## 2015-09-19 MED ORDER — HYDROCODONE-ACETAMINOPHEN 7.5-325 MG PO TABS: 1.0000 | ORAL_TABLET | Freq: Four times a day (QID) | ORAL | Status: DC | PRN

## 2015-09-19 MED ORDER — MONTELUKAST SODIUM 10 MG PO TABS: 10.0000 mg | ORAL_TABLET | Freq: Every day | ORAL | Status: DC

## 2015-09-19 NOTE — Patient Instructions (Signed)
Trial of singulair  Continue current medications We will call with lab results F/U 3 months

## 2015-09-22 ENCOUNTER — Encounter: Payer: Self-pay | Admitting: Family Medicine

## 2015-09-22 DIAGNOSIS — J302 Other seasonal allergic rhinitis: Secondary | ICD-10-CM | POA: Insufficient documentation

## 2015-09-22 NOTE — Assessment & Plan Note (Signed)
I abuse his uncontrolled we can get her consistently below 8% this to be a significant improvement. I've given her samples of the Invokana she was given that information to call and check on the prescription assistance program

## 2015-09-22 NOTE — Assessment & Plan Note (Signed)
Trial Singulair

## 2015-09-22 NOTE — Progress Notes (Signed)
Patient ID: Tina [name], female   DOB: February 08, 1951, 65 y.o.   MRN: [id number]   Subjective:    Patient ID: Tina CanalJudy H [name], female    DOB: February 08, 1951, 65 y.o.   MRN: [id number]  Patient presents for F/U Patient to follow-up chronic medical proximal to her she has diabetes mellitus her last A1c uncontrolled at9%. She states that she is taking her insulin as prescribed and she does not have her Invokana she still awaiting the drug assistance program. She not bring her readings with her today.  He also requests a different allergy medication she has tried Claritin and CuratorAllegra Zyrtec Flonase with no improvement.  Requests a refill on her pain medication.  Review Of Systems:  GEN- denies fatigue, fever, weight loss,weakness, recent illness HEENT- denies eye drainage, change in vision, nasal discharge, CVS- denies chest pain, palpitations RESP- denies SOB, cough, wheeze ABD- denies N/V, change in stools, abd pain GU- denies dysuria, hematuria, dribbling, incontinence MSK- denies joint pain, muscle aches, injury Neuro- denies headache, dizziness, syncope, seizure activity       Objective:    BP 134/80 mmHg  Pulse 88  Temp(Src) 98.8 F (37.1 C) (Oral)  Resp 14  Ht 5' (1.524 m)  Wt 191 lb (86.637 kg)  BMI 37.30 kg/m2 GEN- NAD, alert and oriented x3 HEENT- PERRL, EOMI, non injected sclera, pink conjunctiva, MMM, oropharynx clear CVS- RRR, no murmur RESP-CTAB EXT- No edema Pulses- Radial  2+        Assessment & Plan:      Problem List Items Addressed This Visit    Seasonal allergies    Trial Singulair      Obesity   Hyperlipidemia   Relevant Orders   Lipid panel (Completed)   Essential hypertension    Pressure looks okay no change in medication She will have nonfasting labs done today      Diabetes mellitus type II, uncontrolled (HCC) - Primary    I abuse his uncontrolled we can get her consistently below 8% this to be a significant improvement. I've given her  samples of the Invokana she was given that information to call and check on the prescription assistance program      Relevant Orders   CBC with Differential/Platelet (Completed)   Comprehensive metabolic panel (Completed)   Hemoglobin A1c (Completed)      Note: This dictation was prepared with Dragon dictation along with smaller phrase technology. Any transcriptional errors that result from this process are unintentional.

## 2015-09-22 NOTE — Assessment & Plan Note (Signed)
Pressure looks okay no change in medication She will have nonfasting labs done today

## 2015-09-30 ENCOUNTER — Ambulatory Visit: Payer: Medicare HMO | Admitting: Family Medicine

## 2015-10-08 ENCOUNTER — Telehealth: Payer: Self-pay | Admitting: Family Medicine

## 2015-10-08 MED ORDER — PAROXETINE HCL 40 MG PO TABS: 40.0000 mg | ORAL_TABLET | Freq: Every morning | ORAL | Status: DC

## 2015-10-08 MED ORDER — METOPROLOL TARTRATE 25 MG PO TABS: 12.5000 mg | ORAL_TABLET | Freq: Two times a day (BID) | ORAL | Status: DC

## 2015-10-08 MED ORDER — OMEPRAZOLE 20 MG PO CPDR: DELAYED_RELEASE_CAPSULE | ORAL | Status: DC

## 2015-10-08 MED ORDER — LOSARTAN POTASSIUM 100 MG PO TABS: 100.0000 mg | ORAL_TABLET | Freq: Every day | ORAL | Status: DC

## 2015-10-08 MED ORDER — FENOFIBRATE 48 MG PO TABS: 48.0000 mg | ORAL_TABLET | Freq: Every day | ORAL | Status: DC

## 2015-10-08 NOTE — Telephone Encounter (Signed)
Patient calling to speak to you regarding getting her meds by mail  Please call her at [phone] (H)

## 2015-10-08 NOTE — Telephone Encounter (Signed)
Call placed to patient.   Requested routine meds to go to [facility] mail order.   Prescription sent to pharmacy.

## 2015-10-29 ENCOUNTER — Telehealth: Payer: Self-pay | Admitting: Family Medicine

## 2015-10-29 MED ORDER — FLUCONAZOLE 150 MG PO TABS: 150.0000 mg | ORAL_TABLET | Freq: Once | ORAL | Status: DC

## 2015-10-29 NOTE — Telephone Encounter (Signed)
Call placed to patient to inquire. LMTRC. 

## 2015-10-29 NOTE — Telephone Encounter (Signed)
Prescription sent to pharmacy.   Samples placed at front desk.

## 2015-10-29 NOTE — Telephone Encounter (Signed)
Okay to send diflucan 150mg  x 1, repeat in 3 days

## 2015-10-29 NOTE — Telephone Encounter (Signed)
Received call from patient.   Reports that she is out of Invokana and is requesting samples.   Also reports that she has vaginal itching and thick white discharge. Requested order for Diflucan. States that she has had multiple yeast infections since beginning invokana.   MD please advise.

## 2015-10-29 NOTE — Telephone Encounter (Signed)
PATIENT HAS QUESTIONS ABOUT HER INVOKANA, REGARDING IF SHE STILL NEEDS TO TAKE IT OR NOT  [phone]

## 2015-11-13 ENCOUNTER — Telehealth: Payer: Self-pay | Admitting: Family Medicine

## 2015-11-13 NOTE — Telephone Encounter (Signed)
Pt is requesting a refill of Hydrocodone 7.5-325

## 2015-11-13 NOTE — Telephone Encounter (Signed)
Ok to refill??  Last office visit/ refill 09/19/2015.

## 2015-11-14 MED ORDER — HYDROCODONE-ACETAMINOPHEN 7.5-325 MG PO TABS: 1.0000 | ORAL_TABLET | Freq: Four times a day (QID) | ORAL | Status: DC | PRN

## 2015-11-14 NOTE — Telephone Encounter (Signed)
Prescription printed and patient made aware to come to office to pick up after 4pm on 11/14/2015.

## 2015-11-14 NOTE — Telephone Encounter (Signed)
Okay to refill? 

## 2015-12-02 ENCOUNTER — Ambulatory Visit: Payer: Medicare HMO | Admitting: Family Medicine

## 2015-12-23 ENCOUNTER — Encounter: Payer: Self-pay | Admitting: Family Medicine

## 2015-12-23 ENCOUNTER — Ambulatory Visit (INDEPENDENT_AMBULATORY_CARE_PROVIDER_SITE_OTHER): Payer: Medicare HMO | Admitting: Family Medicine

## 2015-12-23 VITALS — BP 132/78 | HR 68 | Temp 98.3°F | Resp 14 | Ht 60.0 in | Wt 190.0 lb

## 2015-12-23 DIAGNOSIS — E785 Hyperlipidemia, unspecified: Secondary | ICD-10-CM | POA: Diagnosis not present

## 2015-12-23 DIAGNOSIS — E669 Obesity, unspecified: Secondary | ICD-10-CM | POA: Diagnosis not present

## 2015-12-23 DIAGNOSIS — E1165 Type 2 diabetes mellitus with hyperglycemia: Secondary | ICD-10-CM

## 2015-12-23 DIAGNOSIS — Z794 Long term (current) use of insulin: Secondary | ICD-10-CM | POA: Diagnosis not present

## 2015-12-23 DIAGNOSIS — I10 Essential (primary) hypertension: Secondary | ICD-10-CM

## 2015-12-23 DIAGNOSIS — IMO0001 Reserved for inherently not codable concepts without codable children: Secondary | ICD-10-CM

## 2015-12-23 LAB — CBC WITH DIFFERENTIAL/PLATELET
BASOS ABS: 60 {cells}/uL (ref 0–200)
BASOS PCT: 1 %
EOS ABS: 120 {cells}/uL (ref 15–500)
Eosinophils Relative: 2 %
HCT: 44.1 % (ref 35.0–45.0)
HEMOGLOBIN: 14.5 g/dL (ref 12.0–15.0)
LYMPHS ABS: 2220 {cells}/uL (ref 850–3900)
Lymphocytes Relative: 37 %
MCH: 32.2 pg (ref 27.0–33.0)
MCHC: 32.9 g/dL (ref 32.0–36.0)
MCV: 97.8 fL (ref 80.0–100.0)
MPV: 11.5 fL (ref 7.5–12.5)
Monocytes Absolute: 420 cells/uL (ref 200–950)
Monocytes Relative: 7 %
NEUTROS ABS: 3180 {cells}/uL (ref 1500–7800)
Neutrophils Relative %: 53 %
Platelets: 224 10*3/uL (ref 140–400)
RBC: 4.51 MIL/uL (ref 3.80–5.10)
RDW: 13.6 % (ref 11.0–15.0)
WBC: 6 10*3/uL (ref 3.8–10.8)

## 2015-12-23 LAB — COMPREHENSIVE METABOLIC PANEL
ALBUMIN: 4.1 g/dL (ref 3.6–5.1)
ALK PHOS: 90 U/L (ref 33–130)
ALT: 19 U/L (ref 6–29)
AST: 16 U/L (ref 10–35)
BUN: 20 mg/dL (ref 7–25)
CHLORIDE: 102 mmol/L (ref 98–110)
CO2: 24 mmol/L (ref 20–31)
CREATININE: 0.91 mg/dL (ref 0.50–0.99)
Calcium: 9 mg/dL (ref 8.6–10.4)
Glucose, Bld: 312 mg/dL — ABNORMAL HIGH (ref 70–99)
POTASSIUM: 4.2 mmol/L (ref 3.5–5.3)
SODIUM: 136 mmol/L (ref 135–146)
TOTAL PROTEIN: 6.9 g/dL (ref 6.1–8.1)
Total Bilirubin: 0.5 mg/dL (ref 0.2–1.2)

## 2015-12-23 LAB — LIPID PANEL
Cholesterol: 213 mg/dL — ABNORMAL HIGH (ref 125–200)
HDL: 53 mg/dL (ref >=46)
Total CHOL/HDL Ratio: 4 Ratio (ref <=5.0)
Triglycerides: 519 mg/dL — ABNORMAL HIGH (ref <150)

## 2015-12-23 LAB — HEMOGLOBIN A1C
HEMOGLOBIN A1C: 9.2 % — AB (ref <5.7)
MEAN PLASMA GLUCOSE: 217 mg/dL

## 2015-12-23 MED ORDER — CANAGLIFLOZIN 100 MG PO TABS: 100.0000 mg | ORAL_TABLET | Freq: Every day | ORAL | 4 refills | Status: DC

## 2015-12-23 MED ORDER — HYDROCODONE-ACETAMINOPHEN 7.5-325 MG PO TABS: 1.0000 | ORAL_TABLET | Freq: Four times a day (QID) | ORAL | 0 refills | Status: DC | PRN

## 2015-12-23 NOTE — Progress Notes (Signed)
    Subjective:    Patient ID: Tina [name], female    DOB: Jan 16, 1951, 65 y.o.   MRN: [id number]  Patient presents for 3 month F/U (is not fasting) Is here to follow-up chronic medical prongs. Diabetes mellitus or last A1c was 9% which was minimal improvement from previous at 9.4%. She is currently on Levemir 50 units 20 units of NovoLog with each meal and Invokana She states her blood sugars have been good . She did not bring her meter she has only had a few above 200 no hypoglycemia. She was seen by Laqueta Carina Her cholesterol had improved her triglycerides came down from 641 to 380 total cholesterol 319 down to 232 LDL was calculated at 103 she is on facial as well as statin drug  She has been having more aching all over she recently moved to have been using her pain medication requests a refill of this     Review Of Systems:  GEN- denies fatigue, fever, weight loss,weakness, recent illness HEENT- denies eye drainage, change in vision, nasal discharge, CVS- denies chest pain, palpitations RESP- denies SOB, cough, wheeze ABD- denies N/V, change in stools, abd pain GU- denies dysuria, hematuria, dribbling, incontinence MSK- + joint pain, muscle aches, injury Neuro- denies headache, dizziness, syncope, seizure activity       Objective:    BP 132/78 (BP Location: Left Arm, Patient Position: Sitting, Cuff Size: Normal)   Pulse 68   Temp 98.3 F (36.8 C) (Oral)   Resp 14   Ht 5' (1.524 m)   Wt 190 lb (86.2 kg)   BMI 37.11 kg/m  GEN- NAD, alert and oriented x3 HEENT- PERRL, EOMI, non injected sclera, pink conjunctiva, MMM, oropharynx clear Neck- Supple, no thyromegaly Skin- scabs on chin/neck CVS- RRR, no murmur RESP-CTAB EXT- No edema Pulses- Radial, DP- 2+        Assessment & Plan:      Problem List Items Addressed This Visit    Obesity - Primary    Continue to stress activity and food choices       Relevant Medications   canagliflozin (INVOKANA)  100 MG TABS tablet   Hyperlipidemia   Essential hypertension    Controlled no change to meds       Relevant Orders   CBC with Differential/Platelet   Comprehensive metabolic panel   Diabetes mellitus type II, uncontrolled (HCC)    Slowly improving, diet is still an issue Given handout on diabetic foods and meal planning Encourage more cooking at home States she is compliant with insulin Goal is A1C , 8% Obtain copy of eye report      Relevant Medications   canagliflozin (INVOKANA) 100 MG TABS tablet   Other Relevant Orders   Hemoglobin A1c   Lipid panel   HM DIABETES FOOT EXAM (Completed)    Other Visit Diagnoses   None.     Note: This dictation was prepared with Dragon dictation along with smaller phrase technology. Any transcriptional errors that result from this process are unintentional.

## 2015-12-23 NOTE — Assessment & Plan Note (Addendum)
Slowly improving, diet is still an issue Given handout on diabetic foods and meal planning Encourage more cooking at home States she is compliant with insulin Goal is A1C , 8% Obtain copy of eye report

## 2015-12-23 NOTE — Assessment & Plan Note (Signed)
Continue to stress activity and food choices

## 2015-12-23 NOTE — Patient Instructions (Addendum)
Release of records- [facility] in [place]  We we will call with labs  F/U 3 months for PHYSICAL

## 2015-12-23 NOTE — Assessment & Plan Note (Signed)
Controlled no change to meds 

## 2015-12-31 ENCOUNTER — Other Ambulatory Visit: Payer: Self-pay | Admitting: *Deleted

## 2015-12-31 DIAGNOSIS — Z794 Long term (current) use of insulin: Principal | ICD-10-CM

## 2015-12-31 DIAGNOSIS — IMO0001 Reserved for inherently not codable concepts without codable children: Secondary | ICD-10-CM

## 2015-12-31 DIAGNOSIS — E1165 Type 2 diabetes mellitus with hyperglycemia: Principal | ICD-10-CM

## 2016-01-06 ENCOUNTER — Other Ambulatory Visit: Payer: Self-pay | Admitting: *Deleted

## 2016-01-06 MED ORDER — FENOFIBRATE 48 MG PO TABS: 48.0000 mg | ORAL_TABLET | Freq: Every day | ORAL | 3 refills | Status: DC

## 2016-02-27 ENCOUNTER — Ambulatory Visit: Payer: Medicare HMO | Admitting: Family Medicine

## 2016-03-02 ENCOUNTER — Ambulatory Visit: Payer: Medicare HMO | Admitting: "Endocrinology

## 2016-03-26 ENCOUNTER — Ambulatory Visit: Payer: Medicare HMO | Admitting: Family Medicine

## 2016-04-01 ENCOUNTER — Ambulatory Visit: Payer: Medicare HMO | Admitting: "Endocrinology

## 2016-04-07 ENCOUNTER — Encounter: Payer: Self-pay | Admitting: Family Medicine

## 2016-04-07 ENCOUNTER — Ambulatory Visit (INDEPENDENT_AMBULATORY_CARE_PROVIDER_SITE_OTHER): Payer: Medicare HMO | Admitting: Family Medicine

## 2016-04-07 VITALS — BP 128/72 | HR 87 | Temp 98.8°F | Resp 14 | Ht 60.0 in | Wt 193.0 lb

## 2016-04-07 DIAGNOSIS — Z794 Long term (current) use of insulin: Secondary | ICD-10-CM

## 2016-04-07 DIAGNOSIS — E1165 Type 2 diabetes mellitus with hyperglycemia: Secondary | ICD-10-CM | POA: Diagnosis not present

## 2016-04-07 DIAGNOSIS — L089 Local infection of the skin and subcutaneous tissue, unspecified: Secondary | ICD-10-CM | POA: Diagnosis not present

## 2016-04-07 DIAGNOSIS — M797 Fibromyalgia: Secondary | ICD-10-CM

## 2016-04-07 DIAGNOSIS — IMO0001 Reserved for inherently not codable concepts without codable children: Secondary | ICD-10-CM

## 2016-04-07 DIAGNOSIS — E782 Mixed hyperlipidemia: Secondary | ICD-10-CM

## 2016-04-07 DIAGNOSIS — B958 Unspecified staphylococcus as the cause of diseases classified elsewhere: Secondary | ICD-10-CM

## 2016-04-07 DIAGNOSIS — I10 Essential (primary) hypertension: Secondary | ICD-10-CM

## 2016-04-07 LAB — CBC WITH DIFFERENTIAL/PLATELET
BASOS ABS: 0 {cells}/uL (ref 0–200)
BASOS PCT: 0 %
EOS ABS: 73 {cells}/uL (ref 15–500)
Eosinophils Relative: 1 %
HEMATOCRIT: 42.6 % (ref 35.0–45.0)
HEMOGLOBIN: 14.6 g/dL (ref 12.0–15.0)
LYMPHS ABS: 2628 {cells}/uL (ref 850–3900)
Lymphocytes Relative: 36 %
MCH: 32.7 pg (ref 27.0–33.0)
MCHC: 34.3 g/dL (ref 32.0–36.0)
MCV: 95.3 fL (ref 80.0–100.0)
MONO ABS: 438 {cells}/uL (ref 200–950)
MONOS PCT: 6 %
MPV: 11.1 fL (ref 7.5–12.5)
NEUTROS ABS: 4161 {cells}/uL (ref 1500–7800)
Neutrophils Relative %: 57 %
Platelets: 234 10*3/uL (ref 140–400)
RBC: 4.47 MIL/uL (ref 3.80–5.10)
RDW: 14 % (ref 11.0–15.0)
WBC: 7.3 10*3/uL (ref 3.8–10.8)

## 2016-04-07 LAB — COMPREHENSIVE METABOLIC PANEL
ALBUMIN: 4.3 g/dL (ref 3.6–5.1)
ALK PHOS: 84 U/L (ref 33–130)
ALT: 19 U/L (ref 6–29)
AST: 19 U/L (ref 10–35)
BUN: 20 mg/dL (ref 7–25)
CHLORIDE: 95 mmol/L — AB (ref 98–110)
CO2: 29 mmol/L (ref 20–31)
Calcium: 10 mg/dL (ref 8.6–10.4)
Creat: 1.03 mg/dL — ABNORMAL HIGH (ref 0.50–0.99)
Glucose, Bld: 416 mg/dL (ref 70–99)
POTASSIUM: 4.4 mmol/L (ref 3.5–5.3)
Sodium: 135 mmol/L (ref 135–146)
TOTAL PROTEIN: 7.3 g/dL (ref 6.1–8.1)
Total Bilirubin: 0.4 mg/dL (ref 0.2–1.2)

## 2016-04-07 LAB — TSH: TSH: 2.89 m[IU]/L

## 2016-04-07 LAB — LIPID PANEL
Cholesterol: 381 mg/dL — ABNORMAL HIGH (ref <200)
HDL: 50 mg/dL — AB (ref >50)
TRIGLYCERIDES: 869 mg/dL — AB (ref <150)
Total CHOL/HDL Ratio: 7.6 Ratio — ABNORMAL HIGH (ref <5.0)

## 2016-04-07 MED ORDER — HYDROCODONE-ACETAMINOPHEN 7.5-325 MG PO TABS: 1.0000 | ORAL_TABLET | Freq: Four times a day (QID) | ORAL | 0 refills | Status: DC | PRN

## 2016-04-07 MED ORDER — CHLORHEXIDINE GLUCONATE 4 % EX LIQD: Freq: Every day | CUTANEOUS | 0 refills | Status: DC | PRN

## 2016-04-07 MED ORDER — ROSUVASTATIN CALCIUM 40 MG PO TABS: 40.0000 mg | ORAL_TABLET | Freq: Every evening | ORAL | 6 refills | Status: DC

## 2016-04-07 MED ORDER — DOXYCYCLINE HYCLATE 100 MG PO TABS: 100.0000 mg | ORAL_TABLET | Freq: Two times a day (BID) | ORAL | 0 refills | Status: DC

## 2016-04-07 NOTE — Patient Instructions (Addendum)
Take antibiotics as prescribed  Take pain medicine Keep endocrine appointment Referral to dermatology  F/U 3 months

## 2016-04-07 NOTE — Assessment & Plan Note (Signed)
Uncontrolled, compliance, nutrition all issues Has appt with endocrine, maybe having a specialist only talk about her diabetes will get her on track. I'm checking her labs today

## 2016-04-07 NOTE — Assessment & Plan Note (Signed)
Unchanged medications refilled

## 2016-04-07 NOTE — Assessment & Plan Note (Signed)
She is to take Crestor as well as TriCor these have been refilled

## 2016-04-07 NOTE — Progress Notes (Signed)
Subjective:    Patient ID: Tina [name], female    DOB: 01-17-1951, 65 y.o.   MRN: [id number]  Patient presents for 3 month F/U (is fasting)  Patient here to follow-up chronic medical problems. Diabetes mellitus she is currently on Levemir 55  units 20 units of NovoLog with each meal and Invokana , last A1C was 9.2%  She was also referred to endocrinology and given phone number to schedule appt,She is finally scheduled appointment this will be on Monday  CBG range- 200-300's  No hypoglycemia She states that she is taking her insulin as prescribed she is not adherent to the diet. She is not very active for mobile  Hypertriglyceridemia on statin drug and tricor she was not taking the Tricor due to cost at last visit, advised fish oil 2000mg  BID as TG were 519 , TC 213, LDL could not be calculated . She now tells me that Crestor is no longer being given to her by the health department so she has not been taking this for the past month   Chronic pain due to fibromyalgia- on paxil and norco as needed she's had some cramps in her legs recently  History recurrent skin infections around her face mostly in the chin area she also gets some in the inner thighs we treated with antibiotics in the past. I recommended that she see dermatology for the recurrent outbreaks on her face but she has declined multiple times in the past. She started breaking out a few days ago she had some mild drainage from the lesions. No FEVER       Review Of Systems:  GEN- denies fatigue, fever, weight loss,weakness, recent illness HEENT- denies eye drainage, change in vision, nasal discharge, CVS- denies chest pain, palpitations RESP- denies SOB, cough, wheeze ABD- denies N/V, change in stools, abd pain GU- denies dysuria, hematuria, dribbling, incontinence MSK- denies joint pain, +muscle aches, injury Neuro- denies headache, dizziness, syncope, seizure activity       Objective:    BP 128/72 (BP Location:  Left Arm, Patient Position: Sitting, Cuff Size: Large)   Pulse 87   Temp 98.8 F (37.1 C) (Oral)   Resp 14   Ht 5' (1.524 m)   Wt 193 lb (87.5 kg)   SpO2 96%   BMI 37.69 kg/m  GEN- NAD, alert and oriented x3 HEENT- PERRL, EOMI, non injected sclera, pink conjunctiva, MMM, oropharynx clear Neck- Supple, shotty ant LAD  CVS- RRR, no murmur RESP-CTAB Skin- scabbed erythematous maculopapular lesions on chin, neck, TTP, no draining lesions,  Ext- no edema       Assessment & Plan:      Problem List Items Addressed This Visit    Hyperlipidemia - Primary    She is to take Crestor as well as TriCor these have been refilled      Relevant Medications   rosuvastatin (CRESTOR) 40 MG tablet   Other Relevant Orders   Lipid panel   Fibromyalgia    Unchanged medications refilled      Essential hypertension   Relevant Medications   rosuvastatin (CRESTOR) 40 MG tablet   Other Relevant Orders   TSH   Diabetes mellitus type II, uncontrolled (HCC)    Uncontrolled, compliance, nutrition all issues Has appt with endocrine, maybe having a specialist only talk about her diabetes will get her on track. I'm checking her labs today      Relevant Medications   rosuvastatin (CRESTOR) 40 MG tablet   Other Relevant Orders  CBC with Differential/Platelet   Comprehensive metabolic panel   Hemoglobin A1c    Other Visit Diagnoses    Staphylococcal infection of skin       recurrent inefctions, in setting of uncontrolled DM, start doxy, hibiclens wash      Note: This dictation was prepared with Dragon dictation along with smaller phrase technology. Any transcriptional errors that result from this process are unintentional.

## 2016-04-08 LAB — HEMOGLOBIN A1C
HEMOGLOBIN A1C: 9.8 % — AB (ref <5.7)
MEAN PLASMA GLUCOSE: 235 mg/dL

## 2016-04-11 NOTE — Progress Notes (Signed)
Subjective:    Patient ID: Tina [name], female    DOB: 28-Jun-1950, 65 y.o.   MRN: [id number]  HPI pt is referred by Dr Jeanice Limurham for diabetes.  Pt states DM was dx'ed in 2002; she has mild if any neuropathy of the lower extremities; she is unaware of any associated chronic complications; she has been on insulin since 2007; pt says her diet is not good, and exercise is limited by fibromyalgia; she has never had GDM, pancreatitis, severe hypoglycemia or DKA.  She takes multiple daily injections.  She stopped invokana, due to lack of effect.  She says cbg's vary from 200-300.  There is no trend throughout the day.  She gets insulins through health dept.   Past Medical History:  Diagnosis Date  . CHF (congestive heart failure) (HCC)   . Depression   . Diabetes mellitus   . Fibromyalgia   . GERD (gastroesophageal reflux disease)   . Hyperlipidemia   . Hypertension   . Superficial thrombophlebitis    right leg    Past Surgical History:  Procedure Laterality Date  . CHOLECYSTECTOMY  1991  . HERNIA REPAIR  approx 1969  . NECK SURGERY  1998  . TONSILLECTOMY AND ADENOIDECTOMY  1966    Social History   Social History  . Marital status: Married    Spouse name: N/A  . Number of children: N/A  . Years of education: N/A   Occupational History  . Not on file.   Social History Main Topics  . Smoking status: Former Smoker    Quit date: 05/07/2010  . Smokeless tobacco: Never Used  . Alcohol use No  . Drug use: No  . Sexual activity: Not Currently   Other Topics Concern  . Not on file   Social History Narrative  . No narrative on file    Current Outpatient Prescriptions on File Prior to Visit  Medication Sig Dispense Refill  . aspirin EC 81 MG tablet Take 81 mg by mouth at bedtime. Reported on 07/03/2015    . cetirizine (ZYRTEC) 10 MG tablet TAKE 1 TABLET BY MOUTH EVERY DAY 90 tablet 1  . fenofibrate (TRICOR) 48 MG tablet Take 1 tablet (48 mg total) by mouth daily. 90 tablet 3    . furosemide (LASIX) 40 MG tablet Take 1 tablet twice a day 180 tablet 3  . HYDROcodone-acetaminophen (NORCO) 7.5-325 MG tablet Take 1 tablet by mouth every 6 (six) hours as needed for moderate pain. 45 tablet 0  . ipratropium (ATROVENT) 0.02 % nebulizer solution Take 2.5 mLs (0.5 mg total) by nebulization every 4 (four) hours as needed for wheezing or shortness of breath. 75 mL 3  . ipratropium (ATROVENT) 0.06 % nasal spray Place 2 sprays into both nostrils 4 (four) times daily as needed for rhinitis.    Marland Kitchen. losartan (COZAAR) 100 MG tablet Take 1 tablet (100 mg total) by mouth daily. 90 tablet 3  . metoprolol tartrate (LOPRESSOR) 25 MG tablet Take 0.5 tablets (12.5 mg total) by mouth 2 (two) times daily. 90 tablet 3  . montelukast (SINGULAIR) 10 MG tablet Take 1 tablet (10 mg total) by mouth at bedtime. 30 tablet 6  . nystatin (MYCOSTATIN) powder Apply topically 4 (four) times daily. 60 g 2  . Omega-3 Fatty Acids (FISH OIL) 1000 MG CAPS Take 2 capsules by mouth 2 (two) times daily.    Marland Kitchen. omeprazole (PRILOSEC) 20 MG capsule TAKE 1 CAPSULE BY MOUTH EVERY DAY 90 capsule 3  . PARoxetine (  PAXIL) 40 MG tablet Take 1 tablet (40 mg total) by mouth every morning. 90 tablet 3  . rosuvastatin (CRESTOR) 40 MG tablet Take 1 tablet (40 mg total) by mouth every evening. 30 tablet 6  . TRUEPLUS INSULIN SYRINGE 31G X 5/16" 1 ML MISC USE AS DIRECTED TO INJECT 100 each 2  . chlorhexidine (HIBICLENS) 4 % external liquid Apply topically daily as needed. (Patient not taking: Reported on 04/12/2016) 120 mL 0  . clotrimazole (LOTRIMIN) 1 % cream Apply 1 application topically 2 (two) times daily. (Patient not taking: Reported on 04/12/2016) 30 g 0  . clotrimazole-betamethasone (LOTRISONE) cream Apply 1 application topically 2 (two) times daily. (Patient not taking: Reported on 04/12/2016) 30 g 1  . doxycycline (VIBRA-TABS) 100 MG tablet Take 1 tablet (100 mg total) by mouth 2 (two) times daily. (Patient not taking: Reported  on 04/12/2016) 20 tablet 0  . potassium chloride (K-DUR) 10 MEQ tablet Take 1 tablet (10 mEq total) by mouth 2 (two) times daily. (Patient not taking: Reported on 04/12/2016) 180 tablet 3   No current facility-administered medications on file prior to visit.     Allergies  Allergen Reactions  . Cefuroxime Axetil Other (See Comments)    REACTION: unspecified  . Fentanyl And Related Other (See Comments)    Durgesic Patch: unknown reaction  . Niaspan [Niacin Er] Other (See Comments)    Burning all over  . Sulfur Nausea And Vomiting  . Vasotec [Enalapril] Cough  . Welchol [Colesevelam Hcl] Other (See Comments)    Muscle aches and joint pains  . Lyrica [Pregabalin] Rash and Other (See Comments)    Blistering rash around same time as starting drug  . Penicillins Rash    Family History  Problem Relation Age of Onset  . Hypertension Mother   . Hyperlipidemia Mother   . Heart disease Mother   . Stroke Sister   . Hypertension Sister   . Hyperlipidemia Sister   . Heart disease Sister   . Diabetes Maternal Grandmother   . Diabetes Daughter     BP 126/64   Pulse (!) 59   Ht 5' (1.524 m)   Wt 195 lb (88.5 kg)   SpO2 95%   BMI 38.08 kg/m    Review of Systems denies weight loss, blurry vision, headache, chest pain, n/v, excessive diaphoresis, memory loss, cold intolerance, rhinorrhea, and easy bruising.  She has generalized myalgias, polyuria, leg cramps, and doe.     Objective:   Physical Exam VS: see vs page GEN: no distress HEAD: head: no deformity eyes: no periorbital swelling, no proptosis external nose and ears are normal mouth: no lesion seen NECK: Neck: a healed scar is present (C-spine procedure).  i do not appreciate a nodule in the thyroid or elsewhere in the neck CHEST WALL: no deformity LUNGS: clear to auscultation CV: reg rate and rhythm, no murmur ABD: abdomen is soft, nontender.  no hepatosplenomegaly.  not distended.  no hernia MUSCULOSKELETAL: muscle  bulk and strength are grossly normal.  no obvious joint swelling.  gait is normal and steady EXTEMITIES: no deformity.  no ulcer on the feet.  feet are of normal color and temp.  Trace bilat leg edema.  There is bilateral onychomycosis of the toenails PULSES: dorsalis pedis intact bilat.  no carotid bruit NEURO:  cn 2-12 grossly intact.   readily moves all 4's.  sensation is intact to touch on the feet.   SKIN:  Normal texture and temperature.  No rash or  suspicious lesion is visible.   NODES:  None palpable at the neck PSYCH: alert, well-oriented.  Does not appear anxious nor depressed.    Lab Results  Component Value Date   HGBA1C 9.8 (H) 04/07/2016   i personally reviewed electrocardiogram tracing (07/29/14):  Indication: syncope.  Impression: Sinus  Bradycardia.    I have reviewed outside records, and summarized: Pt was noted to have severely elevated a1c, and referred here.  She was also noted to have hypertriglyceridemia, and was noted to be unable to afford meds.     Assessment & Plan:  Insulin-requiring type 2 DM: severe exacerbation.  Patient is advised the following: Patient Instructions  good diet and exercise significantly improve the control of your diabetes.  please let me know if you wish to be referred to a dietician.  high blood sugar is very risky to your health.  you should see an eye doctor and dentist every year.  It is very important to get all recommended vaccinations.  Controlling your blood pressure and cholesterol drastically reduces the damage diabetes does to your body.  Those who smoke should quit.  Please discuss these with your doctor.  check your blood sugar twice a day.  vary the time of day when you check, between before the 3 meals, and at bedtime.  also check if you have symptoms of your blood sugar being too high or too low.  please keep a record of the readings and bring it to your next appointment here (or you can bring the meter itself).  You can write  it on any piece of paper.  please call us sooner if your blood sugar goes below 70, or if you have a lot of readings over 200. At our office, we are fortunate to have two specialists who are happy to help you:   Cristy FolksLinda Spagnola, RN, CDE, is a diabetes educator and pump trainer.  She is here on Monday mornings, and all day Tuesday and Wednesday.  She is can help you with low blood sugar avoidance and treatment, injecting insulin, sick day management, and others.   Oran ReinLaura Jobe, RD is our dietician.  She is here all day Thursday and Friday.  She can advise you about a healthy diet.  She can also help you about a variety of special diabetes situations, such as shift work, Animal nutritionistvegeterian diet, gluten-free, diet for kidney patients, traveling with diabetes, and help for those who need to gain weight.   For now, please: Decrease levemir to 55 units at bedtime only, and:  Increase humalog to 45 units 3 times a day (just before each meal).  Please call us next week, to tell us how the blood sugar is doing.  Please see a dietician specialist.  you will receive a phone call, about a day and time for an appointment Please come back for a follow-up appointment in 2 months.

## 2016-04-12 ENCOUNTER — Encounter: Payer: Self-pay | Admitting: Endocrinology

## 2016-04-12 ENCOUNTER — Ambulatory Visit (INDEPENDENT_AMBULATORY_CARE_PROVIDER_SITE_OTHER): Payer: Medicare HMO | Admitting: Endocrinology

## 2016-04-12 VITALS — BP 126/64 | HR 59 | Ht 60.0 in | Wt 195.0 lb

## 2016-04-12 DIAGNOSIS — Z794 Long term (current) use of insulin: Secondary | ICD-10-CM

## 2016-04-12 DIAGNOSIS — E1165 Type 2 diabetes mellitus with hyperglycemia: Secondary | ICD-10-CM

## 2016-04-12 DIAGNOSIS — IMO0001 Reserved for inherently not codable concepts without codable children: Secondary | ICD-10-CM

## 2016-04-12 MED ORDER — INSULIN LISPRO 100 UNIT/ML ~~LOC~~ SOLN: 45.0000 [IU] | Freq: Three times a day (TID) | SUBCUTANEOUS | 3 refills | Status: DC

## 2016-04-12 MED ORDER — INSULIN DETEMIR 100 UNIT/ML ~~LOC~~ SOLN: 55.0000 [IU] | Freq: Every day | SUBCUTANEOUS | 3 refills | Status: DC

## 2016-04-12 NOTE — Patient Instructions (Addendum)
good diet and exercise significantly improve the control of your diabetes.  please let me know if you wish to be referred to a dietician.  high blood sugar is very risky to your health.  you should see an eye doctor and dentist every year.  It is very important to get all recommended vaccinations.  Controlling your blood pressure and cholesterol drastically reduces the damage diabetes does to your body.  Those who smoke should quit.  Please discuss these with your doctor.  check your blood sugar twice a day.  vary the time of day when you check, between before the 3 meals, and at bedtime.  also check if you have symptoms of your blood sugar being too high or too low.  please keep a record of the readings and bring it to your next appointment here (or you can bring the meter itself).  You can write it on any piece of paper.  please call us sooner if your blood sugar goes below 70, or if you have a lot of readings over 200. At our office, we are fortunate to have two specialists who are happy to help you:   Cristy FolksLinda Spagnola, RN, CDE, is a diabetes educator and pump trainer.  She is here on Monday mornings, and all day Tuesday and Wednesday.  She is can help you with low blood sugar avoidance and treatment, injecting insulin, sick day management, and others.   Oran ReinLaura Jobe, RD is our dietician.  She is here all day Thursday and Friday.  She can advise you about a healthy diet.  She can also help you about a variety of special diabetes situations, such as shift work, Animal nutritionistvegeterian diet, gluten-free, diet for kidney patients, traveling with diabetes, and help for those who need to gain weight.   For now, please: Decrease levemir to 55 units at bedtime only, and:  Increase humalog to 45 units 3 times a day (just before each meal).  Please call us next week, to tell us how the blood sugar is doing.  Please see a dietician specialist.  you will receive a phone call, about a day and time for an appointment Please come  back for a follow-up appointment in 2 months.

## 2016-04-13 ENCOUNTER — Telehealth: Payer: Self-pay | Admitting: *Deleted

## 2016-04-13 NOTE — Telephone Encounter (Signed)
Received VMl from patient.   Reports that she kept appt with Endo on Monday. States that inulin was altered and FSBS have been running WNL. States that fasting BS are noted <125.  Voiced concern about "potassium". Call placed to patient to inquire. LMTRC.

## 2016-04-21 ENCOUNTER — Telehealth: Payer: Self-pay | Admitting: Family Medicine

## 2016-04-21 NOTE — Telephone Encounter (Signed)
Patient calling to talk with you regarding her potassium reading from her lab results, she is having some issues  Please call her at [phone]

## 2016-04-22 ENCOUNTER — Telehealth: Payer: Self-pay | Admitting: Endocrinology

## 2016-04-22 NOTE — Telephone Encounter (Signed)
Call placed to patient on 04/22/2016.  Please see notes.

## 2016-04-22 NOTE — Telephone Encounter (Signed)
Pt is letting us know she is doing much better range is 120-130 BS readings have not been more than 147.

## 2016-04-22 NOTE — Telephone Encounter (Signed)
See message to be advised.  

## 2016-04-22 NOTE — Telephone Encounter (Signed)
Call placed to patient.   Reports that she is having increased muscle cramps in her legs.   Advised that last K+ WNL at 4.4.   Patient has just resumed Crestor. States that she ddi not have cramps previously while taking Crestor. Advised to add Co Q 10 for a few days and to call back if no relief.

## 2016-05-27 ENCOUNTER — Telehealth: Payer: Self-pay | Admitting: *Deleted

## 2016-05-27 DIAGNOSIS — R51 Headache: Secondary | ICD-10-CM | POA: Diagnosis not present

## 2016-05-27 DIAGNOSIS — Z79899 Other long term (current) drug therapy: Secondary | ICD-10-CM | POA: Diagnosis not present

## 2016-05-27 DIAGNOSIS — S0990XA Unspecified injury of head, initial encounter: Secondary | ICD-10-CM | POA: Diagnosis not present

## 2016-05-27 DIAGNOSIS — S199XXA Unspecified injury of neck, initial encounter: Secondary | ICD-10-CM | POA: Diagnosis not present

## 2016-05-27 DIAGNOSIS — S299XXA Unspecified injury of thorax, initial encounter: Secondary | ICD-10-CM | POA: Diagnosis not present

## 2016-05-27 DIAGNOSIS — E119 Type 2 diabetes mellitus without complications: Secondary | ICD-10-CM | POA: Diagnosis not present

## 2016-05-27 DIAGNOSIS — W1839XA Other fall on same level, initial encounter: Secondary | ICD-10-CM | POA: Diagnosis not present

## 2016-05-27 DIAGNOSIS — I110 Hypertensive heart disease with heart failure: Secondary | ICD-10-CM | POA: Diagnosis not present

## 2016-05-27 DIAGNOSIS — S0083XA Contusion of other part of head, initial encounter: Secondary | ICD-10-CM | POA: Diagnosis not present

## 2016-05-27 DIAGNOSIS — Z794 Long term (current) use of insulin: Secondary | ICD-10-CM | POA: Diagnosis not present

## 2016-05-27 DIAGNOSIS — H1131 Conjunctival hemorrhage, right eye: Secondary | ICD-10-CM | POA: Diagnosis not present

## 2016-05-27 DIAGNOSIS — I509 Heart failure, unspecified: Secondary | ICD-10-CM | POA: Diagnosis not present

## 2016-05-27 DIAGNOSIS — M797 Fibromyalgia: Secondary | ICD-10-CM | POA: Diagnosis not present

## 2016-05-27 NOTE — Telephone Encounter (Signed)
Received call from patient.   Reports that she had recent fall due to low blood sugar and had impact to face/ head. States that she is having discoloration/ bruising to face and she is now having severe headaches.   Advised to go to ER for eval.

## 2016-05-28 ENCOUNTER — Telehealth: Payer: Self-pay | Admitting: *Deleted

## 2016-05-28 NOTE — Telephone Encounter (Signed)
Received call from patient.   Reports that she is changing pharmacy to mail order. Reports that fenofibrate 54mg  is preferred with no co-pay.   Ok to change?

## 2016-05-29 NOTE — Telephone Encounter (Signed)
Okay to D/C Tricor 48mg  and change to Fenofibrate 54mg  once a day

## 2016-05-31 MED ORDER — FENOFIBRATE 54 MG PO TABS: 54.0000 mg | ORAL_TABLET | Freq: Every day | ORAL | 3 refills | Status: DC

## 2016-05-31 NOTE — Telephone Encounter (Signed)
Prescription sent to pharmacy.

## 2016-06-03 ENCOUNTER — Other Ambulatory Visit: Payer: Self-pay | Admitting: *Deleted

## 2016-06-03 MED ORDER — FUROSEMIDE 40 MG PO TABS: ORAL_TABLET | ORAL | 3 refills | Status: DC

## 2016-06-03 MED ORDER — ROSUVASTATIN CALCIUM 40 MG PO TABS: 40.0000 mg | ORAL_TABLET | Freq: Every evening | ORAL | 3 refills | Status: DC

## 2016-06-03 MED ORDER — OMEPRAZOLE 20 MG PO CPDR: DELAYED_RELEASE_CAPSULE | ORAL | 3 refills | Status: DC

## 2016-06-03 MED ORDER — METOPROLOL TARTRATE 25 MG PO TABS: 12.5000 mg | ORAL_TABLET | Freq: Two times a day (BID) | ORAL | 3 refills | Status: DC

## 2016-06-03 MED ORDER — MONTELUKAST SODIUM 10 MG PO TABS: 10.0000 mg | ORAL_TABLET | Freq: Every day | ORAL | 6 refills | Status: DC

## 2016-06-03 MED ORDER — CETIRIZINE HCL 10 MG PO TABS: 10.0000 mg | ORAL_TABLET | Freq: Every day | ORAL | 1 refills | Status: DC

## 2016-06-03 MED ORDER — FENOFIBRATE 54 MG PO TABS: 54.0000 mg | ORAL_TABLET | Freq: Every day | ORAL | 3 refills | Status: DC

## 2016-06-03 MED ORDER — POTASSIUM CHLORIDE ER 10 MEQ PO TBCR: 10.0000 meq | EXTENDED_RELEASE_TABLET | Freq: Two times a day (BID) | ORAL | 3 refills | Status: DC

## 2016-06-03 MED ORDER — LOSARTAN POTASSIUM 100 MG PO TABS: 100.0000 mg | ORAL_TABLET | Freq: Every day | ORAL | 3 refills | Status: DC

## 2016-06-03 NOTE — Telephone Encounter (Signed)
Received fax requesting refill on routine meds.   Refill appropriate and filled per protocol. 

## 2016-06-11 ENCOUNTER — Ambulatory Visit: Payer: Medicare HMO | Admitting: Endocrinology

## 2016-06-23 ENCOUNTER — Other Ambulatory Visit: Payer: Self-pay | Admitting: *Deleted

## 2016-06-23 ENCOUNTER — Telehealth: Payer: Self-pay | Admitting: *Deleted

## 2016-06-23 MED ORDER — PAROXETINE HCL 40 MG PO TABS: 40.0000 mg | ORAL_TABLET | Freq: Every morning | ORAL | 3 refills | Status: DC

## 2016-06-23 MED ORDER — HYDROCODONE-ACETAMINOPHEN 7.5-325 MG PO TABS: 1.0000 | ORAL_TABLET | Freq: Four times a day (QID) | ORAL | 0 refills | Status: DC | PRN

## 2016-06-23 NOTE — Telephone Encounter (Signed)
Received fax requesting refill on Paxil.   Refill appropriate and filled per protocol.  

## 2016-06-23 NOTE — Telephone Encounter (Signed)
okay

## 2016-06-23 NOTE — Telephone Encounter (Signed)
Prescription printed and patient made aware to come to office to pick up on 06/24/2016.

## 2016-06-23 NOTE — Telephone Encounter (Signed)
Received call from patient.   Requested refill on Hydrocodone.   Ok to refill??  Last office visit/ refill 04/07/2016.

## 2016-07-07 ENCOUNTER — Ambulatory Visit (INDEPENDENT_AMBULATORY_CARE_PROVIDER_SITE_OTHER): Payer: Medicare Other | Admitting: Family Medicine

## 2016-07-07 ENCOUNTER — Encounter: Payer: Self-pay | Admitting: Family Medicine

## 2016-07-07 ENCOUNTER — Encounter: Payer: Self-pay | Admitting: *Deleted

## 2016-07-07 VITALS — BP 122/68 | HR 86 | Temp 98.9°F | Resp 18 | Ht 60.0 in | Wt 198.0 lb

## 2016-07-07 DIAGNOSIS — G4733 Obstructive sleep apnea (adult) (pediatric): Secondary | ICD-10-CM

## 2016-07-07 DIAGNOSIS — E6609 Other obesity due to excess calories: Secondary | ICD-10-CM

## 2016-07-07 DIAGNOSIS — Z6838 Body mass index (BMI) 38.0-38.9, adult: Secondary | ICD-10-CM

## 2016-07-07 DIAGNOSIS — I5022 Chronic systolic (congestive) heart failure: Secondary | ICD-10-CM | POA: Diagnosis not present

## 2016-07-07 DIAGNOSIS — IMO0001 Reserved for inherently not codable concepts without codable children: Secondary | ICD-10-CM

## 2016-07-07 DIAGNOSIS — R0602 Shortness of breath: Secondary | ICD-10-CM | POA: Diagnosis not present

## 2016-07-07 DIAGNOSIS — E782 Mixed hyperlipidemia: Secondary | ICD-10-CM

## 2016-07-07 MED ORDER — FUROSEMIDE 40 MG PO TABS: ORAL_TABLET | ORAL | 3 refills | Status: DC

## 2016-07-07 NOTE — Assessment & Plan Note (Signed)
I'm concerned that this a decompensation of her heart failure. She was recently at the emergency room states that she had a lot of heart workup done. We will obtain the records from the see if they did another echo. Have her increase her Lasix to 60 mg twice a day. I'll also get an appointment with the cardiologist. Her blood pressure is controlled. She is now seeing endocrinology for her diabetes. I will check a BNP as well as metabolic panel today.

## 2016-07-07 NOTE — Progress Notes (Signed)
Subjective:    Patient ID: Tina [name], female    DOB: 03-28-1951, 66 y.o.   MRN: [id number]  Patient presents for 2 month F/U (is not fasting) and SOB (reports that she wakes up at night intermittently with SOB and sometimes feels like a weight is on her chest)  For follow-up on diabetes mellitus. Her difficulties with compliance with her specialists in her medications. Her last A1c was 9.8% She was sent to endocrinology again after last visit in November for her diabetes which is uncontrolled. She did have 1 hypoglycemic episode, she got out of bed and fell forward and hit head, seen at [facility] ED in Jan.   She's had difficulty getting her medications secondary to finances recently with the whole department and had been off of her cholesterol medication as well as the last visit. She was restarted on medications after we called in check with her pharmacy about pricing. Her last triglycerides were 869  - now has new insurance medication, has been on the cholesterol medication daily  She was seen by endocrinology on November 20. Her insulin was suggested to heme along 45 units with each meal and Levemir 55 units at bedtime she has a follow-up next week   Today she reports that she's had intermittent episodes of shortness of breath feeling like there is a weight on her chest typically when she is lying back. Had severe episode 2 days ago. She has history of congestive heart failure is on losartan as well as Lasix 40 mg once a day she is also on a beta blocker Her last echo was in 2015 at that time she had preserved ejection fraction with diastolic dysfunction Her weight is up 3 pounds since her last visit in November She did try her breathing treatment with minimal improvement    Review Of Systems:  GEN- denies fatigue, fever, weight loss,weakness, recent illness HEENT- denies eye drainage, change in vision, nasal discharge, CVS- denies chest pain, palpitations RESP- + SOB, cough,  wheeze ABD- denies N/V, change in stools, abd pain GU- denies dysuria, hematuria, dribbling, incontinence MSK- denies joint pain, muscle aches, injury Neuro- denies headache, dizziness, syncope, seizure activity       Objective:    BP 122/68   Pulse 86   Temp 98.9 F (37.2 C) (Oral)   Resp 18   Ht 5' (1.524 m)   Wt 198 lb (89.8 kg)   SpO2 96%   BMI 38.67 kg/m  GEN- NAD, alert and oriented x3,SOB laying flat  HEENT- PERRL, EOMI, non injected sclera, pink conjunctiva, MMM, oropharynx clear Neck- Supple, JVD 3cm CVS- RRR, no murmur RESP-CTAB ABD-NABS,soft,NT,ND EXT- No edema Pulses- Radial, DP- 2+   EKG- NSR, low voltage in few leads, no ST changes      Assessment & Plan:      Problem List Items Addressed This Visit    SOB (shortness of breath) - Primary   Relevant Orders   EKG 12-Lead (Completed)   Brain natriuretic peptide   Ambulatory referral to Cardiology   Obstructive sleep apnea    Possibility that her symptoms are also related to her obstructive sleep apnea which is untreated as she has not follow through with sleep study over the years. We'll have her seen by cardiology first but this will also need to be done.      Obesity   Hyperlipidemia   Relevant Medications   furosemide (LASIX) 40 MG tablet   Other Relevant Orders   Lipid  panel   CBC with Differential/Platelet   CHF (congestive heart failure) (HCC)    I'm concerned that this a decompensation of her heart failure. She was recently at the emergency room states that she had a lot of heart workup done. We will obtain the records from the see if they did another echo. Have her increase her Lasix to 60 mg twice a day. I'll also get an appointment with the cardiologist. Her blood pressure is controlled. She is now seeing endocrinology for her diabetes. I will check a BNP as well as metabolic panel today.      Relevant Medications   furosemide (LASIX) 40 MG tablet   Other Relevant Orders   Brain  natriuretic peptide   Ambulatory referral to Cardiology   CBC with Differential/Platelet   Basic metabolic panel      Note: This dictation was prepared with Dragon dictation along with smaller phrase technology. Any transcriptional errors that result from this process are unintentional.

## 2016-07-07 NOTE — Assessment & Plan Note (Signed)
Possibility that her symptoms are also related to her obstructive sleep apnea which is untreated as she has not follow through with sleep study over the years. We'll have her seen by cardiology first but this will also need to be done.

## 2016-07-07 NOTE — Patient Instructions (Addendum)
Increase lasix to 1.5 tablets twice a day (60mg ) for next 3 DAYS  Referral to heart doctor  F/U 3 months

## 2016-07-08 LAB — CBC WITH DIFFERENTIAL/PLATELET
Basophils Absolute: 87 cells/uL (ref 0–200)
Basophils Relative: 1 %
EOS ABS: 174 {cells}/uL (ref 15–500)
Eosinophils Relative: 2 %
HEMATOCRIT: 41.5 % (ref 35.0–45.0)
Hemoglobin: 13.9 g/dL (ref 12.0–15.0)
LYMPHS PCT: 29 %
Lymphs Abs: 2523 cells/uL (ref 850–3900)
MCH: 32.4 pg (ref 27.0–33.0)
MCHC: 33.5 g/dL (ref 32.0–36.0)
MCV: 96.7 fL (ref 80.0–100.0)
MONO ABS: 522 {cells}/uL (ref 200–950)
MPV: 11.3 fL (ref 7.5–12.5)
Monocytes Relative: 6 %
NEUTROS PCT: 62 %
Neutro Abs: 5394 cells/uL (ref 1500–7800)
Platelets: 247 10*3/uL (ref 140–400)
RBC: 4.29 MIL/uL (ref 3.80–5.10)
RDW: 13.9 % (ref 11.0–15.0)
WBC: 8.7 10*3/uL (ref 3.8–10.8)

## 2016-07-08 LAB — BASIC METABOLIC PANEL
BUN: 14 mg/dL (ref 7–25)
CALCIUM: 9.5 mg/dL (ref 8.6–10.4)
CO2: 28 mmol/L (ref 20–31)
CREATININE: 0.84 mg/dL (ref 0.50–0.99)
Chloride: 100 mmol/L (ref 98–110)
Glucose, Bld: 312 mg/dL — ABNORMAL HIGH (ref 70–99)
Potassium: 4.7 mmol/L (ref 3.5–5.3)
Sodium: 137 mmol/L (ref 135–146)

## 2016-07-08 LAB — BRAIN NATRIURETIC PEPTIDE: BRAIN NATRIURETIC PEPTIDE: 68.6 pg/mL (ref <100)

## 2016-07-08 LAB — LIPID PANEL
CHOL/HDL RATIO: 4.3 ratio (ref <5.0)
CHOLESTEROL: 227 mg/dL — AB (ref <200)
HDL: 53 mg/dL (ref >50)
LDL Cholesterol: 105 mg/dL — ABNORMAL HIGH (ref <100)
Triglycerides: 347 mg/dL — ABNORMAL HIGH (ref <150)
VLDL: 69 mg/dL — AB (ref <30)

## 2016-07-13 ENCOUNTER — Other Ambulatory Visit: Payer: Self-pay | Admitting: *Deleted

## 2016-07-13 DIAGNOSIS — I509 Heart failure, unspecified: Secondary | ICD-10-CM

## 2016-07-15 ENCOUNTER — Encounter: Payer: Self-pay | Admitting: Endocrinology

## 2016-07-15 ENCOUNTER — Ambulatory Visit (INDEPENDENT_AMBULATORY_CARE_PROVIDER_SITE_OTHER): Payer: Medicare Other | Admitting: Endocrinology

## 2016-07-15 VITALS — BP 132/88 | HR 78 | Ht 60.0 in | Wt 200.0 lb

## 2016-07-15 DIAGNOSIS — Z794 Long term (current) use of insulin: Secondary | ICD-10-CM | POA: Diagnosis not present

## 2016-07-15 DIAGNOSIS — IMO0001 Reserved for inherently not codable concepts without codable children: Secondary | ICD-10-CM

## 2016-07-15 DIAGNOSIS — E1165 Type 2 diabetes mellitus with hyperglycemia: Secondary | ICD-10-CM | POA: Diagnosis not present

## 2016-07-15 LAB — POCT GLYCOSYLATED HEMOGLOBIN (HGB A1C): HEMOGLOBIN A1C: 8

## 2016-07-15 MED ORDER — INSULIN LISPRO 100 UNIT/ML ~~LOC~~ SOLN: SUBCUTANEOUS | 3 refills | Status: DC

## 2016-07-15 NOTE — Progress Notes (Signed)
Subjective:    Patient ID: Tina [name], female    DOB: [birth year], 66 y.o.   MRN: [id number]  HPI Pt returns for f/u of diabetes mellitus: DM type: Insulin-requiring type 2 Dx'ed: 2002 Complications: none Therapy: insulin since 2007 GDM: never DKA: never Severe hypoglycemia: never Pancreatitis: never Other: She gets insulins through health dept Interval history: She has mild hypoglycemia approx once per month.  This usually happens at lunch or on the afternoon.  no cbg record, but states cbg's are higher at hs than fasting.   Past Medical History:  Diagnosis Date  . CHF (congestive heart failure) (HCC)   . Depression   . Diabetes mellitus   . Fibromyalgia   . GERD (gastroesophageal reflux disease)   . Hyperlipidemia   . Hypertension   . Superficial thrombophlebitis    right leg    Past Surgical History:  Procedure Laterality Date  . CHOLECYSTECTOMY  1991  . HERNIA REPAIR  approx 1969  . NECK SURGERY  1998  . TONSILLECTOMY AND ADENOIDECTOMY  1966    Social History   Social History  . Marital status: Married    Spouse name: N/A  . Number of children: N/A  . Years of education: N/A   Occupational History  . Not on file.   Social History Main Topics  . Smoking status: Former Smoker    Quit date: 05/07/2010  . Smokeless tobacco: Never Used  . Alcohol use No  . Drug use: No  . Sexual activity: Not Currently   Other Topics Concern  . Not on file   Social History Narrative  . No narrative on file    Current Outpatient Prescriptions on File Prior to Visit  Medication Sig Dispense Refill  . aspirin EC 81 MG tablet Take 81 mg by mouth at bedtime. Reported on 07/03/2015    . cetirizine (ZYRTEC) 10 MG tablet Take 1 tablet (10 mg total) by mouth daily. (Patient not taking: Reported on 07/16/2016) 90 tablet 1  . chlorhexidine (HIBICLENS) 4 % external liquid Apply topically daily as needed. 120 mL 0  . clotrimazole (LOTRIMIN) 1 % cream Apply 1 application  topically 2 (two) times daily. 30 g 0  . clotrimazole-betamethasone (LOTRISONE) cream Apply 1 application topically 2 (two) times daily. 30 g 1  . fenofibrate 54 MG tablet Take 1 tablet (54 mg total) by mouth daily. 90 tablet 3  . furosemide (LASIX) 40 MG tablet Take 1 tablet twice a day 180 tablet 3  . HYDROcodone-acetaminophen (NORCO) 7.5-325 MG tablet Take 1 tablet by mouth every 6 (six) hours as needed for moderate pain. 45 tablet 0  . insulin detemir (LEVEMIR) 100 UNIT/ML injection Inject 0.55 mLs (55 Units total) into the skin at bedtime. 60 mL 3  . ipratropium (ATROVENT) 0.02 % nebulizer solution Take 2.5 mLs (0.5 mg total) by nebulization every 4 (four) hours as needed for wheezing or shortness of breath. 75 mL 3  . ipratropium (ATROVENT) 0.06 % nasal spray Place 2 sprays into both nostrils 4 (four) times daily as needed for rhinitis.    Marland Kitchen. losartan (COZAAR) 100 MG tablet Take 1 tablet (100 mg total) by mouth daily. 90 tablet 3  . metoprolol tartrate (LOPRESSOR) 25 MG tablet Take 0.5 tablets (12.5 mg total) by mouth 2 (two) times daily. 90 tablet 3  . montelukast (SINGULAIR) 10 MG tablet Take 1 tablet (10 mg total) by mouth at bedtime. 30 tablet 6  . nystatin (MYCOSTATIN) powder Apply topically 4 (four)  times daily. 60 g 2  . Omega-3 Fatty Acids (FISH OIL) 1000 MG CAPS Take 2 capsules by mouth 2 (two) times daily.    Marland Kitchen omeprazole (PRILOSEC) 20 MG capsule TAKE 1 CAPSULE BY MOUTH EVERY DAY 90 capsule 3  . PARoxetine (PAXIL) 40 MG tablet Take 1 tablet (40 mg total) by mouth every morning. 90 tablet 3  . potassium chloride (K-DUR) 10 MEQ tablet Take 1 tablet (10 mEq total) by mouth 2 (two) times daily. 180 tablet 3  . rosuvastatin (CRESTOR) 40 MG tablet Take 1 tablet (40 mg total) by mouth every evening. 90 tablet 3  . TRUEPLUS INSULIN SYRINGE 31G X 5/16" 1 ML MISC USE AS DIRECTED TO INJECT 100 each 2   No current facility-administered medications on file prior to visit.     Allergies    Allergen Reactions  . Cefuroxime Axetil Other (See Comments)    REACTION: unspecified  . Fentanyl And Related Other (See Comments)    Durgesic Patch: unknown reaction  . Niaspan [Niacin Er] Other (See Comments)    Burning all over  . Sulfur Nausea And Vomiting  . Vasotec [Enalapril] Cough  . Welchol [Colesevelam Hcl] Other (See Comments)    Muscle aches and joint pains  . Lyrica [Pregabalin] Rash and Other (See Comments)    Blistering rash around same time as starting drug  . Penicillins Rash    Family History  Problem Relation Age of Onset  . Hypertension Mother   . Hyperlipidemia Mother   . Heart disease Mother   . Stroke Sister   . Hypertension Sister   . Hyperlipidemia Sister   . Heart disease Sister   . Diabetes Maternal Grandmother   . Diabetes Daughter     BP 132/88   Pulse 78   Ht 5' (1.524 m)   Wt 200 lb (90.7 kg)   SpO2 94%   BMI 39.06 kg/m   Review of Systems Denies LOC.      Objective:   Physical Exam VITAL SIGNS:  See vs page GENERAL: no distress Pulses: dorsalis pedis intact bilat.  MSK: no deformity of the feet CV: no leg edema Skin:  no ulcer on the feet.  normal color and temp on the feet. Neuro: sensation is intact to touch on the feet.  A1c=8.0%    Assessment & Plan:  Insulin-requiring type 2 DM: Based on the pattern of her cbg's, she needs some adjustment in her therapy.    Patient is advised the following: Patient Instructions  check your blood sugar twice a day.  vary the time of day when you check, between before the 3 meals, and at bedtime.  also check if you have symptoms of your blood sugar being too high or too low.  please keep a record of the readings and bring it to your next appointment here (or you can bring the meter itself).  You can write it on any piece of paper.  please call us sooner if your blood sugar goes below 70, or if you have a lot of readings over 200.  Please change humalog to 3 times a day (just before each  meal), 40-40-60 units, and: Please continue the same levemir.   Please see a dietician specialist.   Please come back for a follow-up appointment in 2 months.

## 2016-07-15 NOTE — Patient Instructions (Addendum)
check your blood sugar twice a day.  vary the time of day when you check, between before the 3 meals, and at bedtime.  also check if you have symptoms of your blood sugar being too high or too low.  please keep a record of the readings and bring it to your next appointment here (or you can bring the meter itself).  You can write it on any piece of paper.  please call us sooner if your blood sugar goes below 70, or if you have a lot of readings over 200.  Please change humalog to 3 times a day (just before each meal), 40-40-60 units, and: Please continue the same levemir.   Please see a dietician specialist.   Please come back for a follow-up appointment in 2 months.

## 2016-07-16 ENCOUNTER — Encounter: Payer: Medicare Other | Attending: Endocrinology | Admitting: Dietician

## 2016-07-16 ENCOUNTER — Encounter: Payer: Self-pay | Admitting: Dietician

## 2016-07-16 DIAGNOSIS — Z794 Long term (current) use of insulin: Secondary | ICD-10-CM | POA: Diagnosis not present

## 2016-07-16 DIAGNOSIS — Z713 Dietary counseling and surveillance: Secondary | ICD-10-CM | POA: Diagnosis not present

## 2016-07-16 DIAGNOSIS — E1165 Type 2 diabetes mellitus with hyperglycemia: Secondary | ICD-10-CM | POA: Insufficient documentation

## 2016-07-16 DIAGNOSIS — IMO0001 Reserved for inherently not codable concepts without codable children: Secondary | ICD-10-CM

## 2016-07-16 NOTE — Progress Notes (Signed)
Diabetes Self-Management Education  Visit Type: First/Initial  Appt. Start Time: 1300 Appt. End Time: 1430  07/16/2016  Tina [name], identified by name and date of birth, is a 66 y.o. female with a diagnosis of Diabetes: Type 2. Other hx includes CHF, depression, GERD, Hyperlipidemia, HTN, and fibromyalgia.  She has difficulty sleeping at times and often cannot sleep until 3 am and then sleeps late the next day.  Other times she may sleep all night and all day and therefore miss her insulin for that day.    Medications include 4 units Humalog with meals (lunch and dinner) as well as 5 units at HS.  6 units Levemier each HS.  She has been having lows at night at times.  She has reduced her night snacking.  Patient lives alone in a retirement community.  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  Her ex-husband will help her but she has little other support.    ASSESSMENT  Height 4\' 11"  (1.499 m), weight 200 lb (90.7 kg).  This is her highest weight. Body mass index is 40.4 kg/m.      Diabetes Self-Management Education - 07/16/16 1333      Visit Information   Visit Type First/Initial     Initial Visit   Diabetes Type Type 2   Are you currently following a meal plan? No   Are you taking your medications as prescribed? Yes   Date Diagnosed 2002  on insulin since 2007     Health Coping   How would you rate your overall health? Poor     Psychosocial Assessment   Patient Belief/Attitude about Diabetes Motivated to manage diabetes   Self-care barriers None   Self-management support Doctor's office;Family   Other persons present Patient   Patient Concerns Nutrition/Meal planning;Weight Control;Glycemic Control   Special Needs None   Preferred Learning Style No preference indicated   Learning Readiness Ready   How often do you need to have someone help you when you read instructions, pamphlets, or other written materials from your doctor or  pharmacy? 1 - Never   What is the last grade level you completed in school? 12th grade     Pre-Education Assessment   Patient understands the diabetes disease and treatment process. Needs Instruction   Patient understands incorporating nutritional management into lifestyle. Needs Instruction   Patient undertands incorporating physical activity into lifestyle. Needs Instruction   Patient understands using medications safely. Needs Instruction   Patient understands monitoring blood glucose, interpreting and using results Needs Instruction   Patient understands prevention, detection, and treatment of acute complications. Needs Instruction   Patient understands prevention, detection, and treatment of chronic complications. Needs Instruction   Patient understands how to develop strategies to address psychosocial issues. Needs Instruction   Patient understands how to develop strategies to promote health/change behavior. Needs Instruction     Complications   Last HgB A1C per patient/outside source 8 %  07/15/16 decreased from 9.8%   How often do you check your blood sugar? 1-2 times/day  2-3   Fasting Blood glucose range (mg/dL) >098;119-147;829-562;13-086   Number of hypoglycemic episodes per month 3   Can you tell when your blood sugar is low? Yes   What do you do if your blood sugar is low? drink juice   Number of hyperglycemic episodes per week 5   Can you tell when your blood sugar is high? No   Have you had a dilated eye  exam in the past 12 months? Yes   Have you had a dental exam in the past 12 months? No   Are you checking your feet? Yes   How many days per week are you checking your feet? 7     Dietary Intake   Breakfast skips because she is sleeping OR instant oatmeal (plain with brown sugar OR flavored) OR scrabled egg, 1 sausage, dry white toast OR 2 slices peanut butter toast   Snack (morning) none   Lunch hamburger on bun without mayo OR peanut butter sandwich OR [place] or ham  sandwich  depends on what time she wakes up   Snack (afternoon) none   Dinner roasted chicken sandwich OR white chicken chili, crackers or cornbread OR homemade vegetable soup OR grilled chicken, peas and potatoes   Snack (evening) was eating pretzels or NABS or unsweetened applesauce OR skim milk with Ovaltine   Beverage(s) water, cranberry juice, 2% milk, sprirte zero, coffee occasionally, ovaltine in milk, smoothie (from restaurant)     Exercise   Exercise Type ADL's   How many days per week to you exercise? 0   How many minutes per day do you exercise? 0   Total minutes per week of exercise 0     Patient Education   Previous Diabetes Education No   Disease state  Definition of diabetes, type 1 and 2, and the diagnosis of diabetes   Nutrition management  Role of diet in the treatment of diabetes and the relationship between the three main macronutrients and blood glucose level;Food label reading, portion sizes and measuring food.;Meal options for control of blood glucose level and chronic complications.   Physical activity and exercise  Role of exercise on diabetes management, blood pressure control and cardiac health.;Helped patient identify appropriate exercises in relation to his/her diabetes, diabetes complications and other health issue.   Medications Reviewed patients medication for diabetes, action, purpose, timing of dose and side effects.   Monitoring Purpose and frequency of SMBG.;Identified appropriate SMBG and/or A1C goals.   Acute complications Taught treatment of hypoglycemia - the 15 rule.   Chronic complications Relationship between chronic complications and blood glucose control;Assessed and discussed foot care and prevention of foot problems;Dental care;Retinopathy and reason for yearly dilated eye exams   Psychosocial adjustment Worked with patient to identify barriers to care and solutions;Role of stress on diabetes   Personal strategies to promote health Lifestyle  issues that need to be addressed for better diabetes care     Individualized Goals (developed by patient)   Nutrition General guidelines for healthy choices and portions discussed   Physical Activity Exercise 3-5 times per week;30 minutes per day   Medications take my medication as prescribed   Monitoring  test my blood glucose as discussed   Reducing Risk examine blood glucose patterns;treat hypoglycemia with 15 grams of carbs if blood glucose less than 70mg /dL   Health Coping discuss diabetes with (comment)  MD?RD     Post-Education Assessment   Patient understands the diabetes disease and treatment process. Needs Review   Patient understands incorporating nutritional management into lifestyle. Needs Review   Patient undertands incorporating physical activity into lifestyle. Needs Review   Patient understands using medications safely. Needs Review   Patient understands monitoring blood glucose, interpreting and using results Needs Review   Patient understands prevention, detection, and treatment of acute complications. Needs Review   Patient understands prevention, detection, and treatment of chronic complications. Needs Review   Patient understands how to  develop strategies to address psychosocial issues. Needs Review   Patient understands how to develop strategies to promote health/change behavior. Needs Review     Outcomes   Expected Outcomes Demonstrated interest in learning. Expect positive outcomes   Future DMSE 2 months   Program Status Completed      Individualized Plan for Diabetes Self-Management Training:   Learning Objective:  Patient will have a greater understanding of diabetes self-management. Patient education plan is to attend individual and/or group sessions per assessed needs and concerns.   Plan:   Patient Instructions  Bake, broil or grill rather than fry. Choose 1/2% or 1% milk Choose fresh or frozen vegetables rather than canned OR canned with no added  salt. Avoid processed meat such as hot dogs, sausage, ham.  You can get reduced sodium Malaw[place]key from the deli or use leftover chicken. Start walking daily.  Start slowly and increase to 30 minutes most days. Continue to avoid added salt and high sodium foods. Avoid watching TV or using other electronics for 2 hours prior to bed. Remember the superman pose to help with motivation for exercise.  Plan:  Aim for 2 Carb Choices per meal (30 grams) +/- 1 either way  Aim for 0-1 Carbs per snack if hungry  Include protein in moderation with your meals and snacks Consider reading food labels for Total Carbohydrate and Fat Grams of foods Consider  increasing your activity level by walking. Consider checking BG at alternate times per day as directed by MD  Continue taking medication as directed by MD      Expected Outcomes:  Demonstrated interest in learning. Expect positive outcomes  Education material provided: Living Well with Diabetes, Food label handouts, A1C conversion sheet, Meal plan card, My Plate and Snack sheet  If problems or questions, patient to contact team via:  Phone  Future DSME appointment: 2 months

## 2016-07-16 NOTE — Patient Instructions (Addendum)
Bake, broil or grill rather than fry. Choose 1/2% or 1% milk Choose fresh or frozen vegetables rather than canned OR canned with no added salt. Avoid processed meat such as hot dogs, sausage, ham.  You can get reduced sodium [place]key from the deli or use leftover chicken. Start walking daily.  Start slowly and increase to 30 minutes most days. Continue to avoid added salt and high sodium foods. Avoid watching TV or using other electronics for 2 hours prior to bed. Remember the superman pose to help with motivation for exercise.  Plan:  Aim for 2 Carb Choices per meal (30 grams) +/- 1 either way  Aim for 0-1 Carbs per snack if hungry  Include protein in moderation with your meals and snacks Consider reading food labels for Total Carbohydrate and Fat Grams of foods Consider  increasing your activity level by walking. Consider checking BG at alternate times per day as directed by MD  Continue taking medication as directed by MD

## 2016-07-19 ENCOUNTER — Ambulatory Visit: Payer: Medicare HMO | Admitting: Family Medicine

## 2016-07-22 ENCOUNTER — Other Ambulatory Visit: Payer: Self-pay | Admitting: Endocrinology

## 2016-07-29 ENCOUNTER — Ambulatory Visit: Payer: Medicare HMO | Admitting: Cardiovascular Disease

## 2016-08-02 ENCOUNTER — Ambulatory Visit (HOSPITAL_COMMUNITY): Payer: Medicare Other | Attending: Family Medicine

## 2016-08-04 NOTE — Telephone Encounter (Signed)
[place] health dept calling on the status of paper work sent to [employer]. [phone]

## 2016-08-04 NOTE — Telephone Encounter (Signed)
I contacted the health department and advised we have not received any paper work and requested the forms to be faxed to [phone] for MD to review.

## 2016-08-05 ENCOUNTER — Telehealth: Payer: Self-pay | Admitting: Family Medicine

## 2016-08-05 ENCOUNTER — Telehealth: Payer: Self-pay | Admitting: *Deleted

## 2016-08-05 NOTE — Telephone Encounter (Signed)
Received call from patient.   States that she is out of Humalog and is requesting sample. States that HD has not received her Humalog from prescription assistance yet.   Advised that office does not have sample at this time.   Inquired as to if anything else can be given in meantime.   MD please advise.   Of note, no Novolog in samples at this time.

## 2016-08-05 NOTE — Telephone Encounter (Signed)
Patient was advised we do not keep samples of Humalog. Patient was advised to contact her PCP to see if they had any samples.

## 2016-08-05 NOTE — Telephone Encounter (Signed)
Patient would like to know if she can have some samples of Humalog.

## 2016-08-06 NOTE — Telephone Encounter (Signed)
Dr. Everardo AllEllison' office ([facility]) does not carry samples.   Please see chart note from 08/06/2016.   MD please advise.

## 2016-08-06 NOTE — Telephone Encounter (Signed)
Patient returned call.   Patient states that she received a vial of Humalog from the health department.

## 2016-08-06 NOTE — Telephone Encounter (Signed)
We dont have samples, she should continue her other meds per endocrinolgist orders She may have to adjust her long acting up by 2-3 units for now

## 2016-08-06 NOTE — Telephone Encounter (Signed)
Call placed to patient. LMTRC.  

## 2016-08-06 NOTE — Telephone Encounter (Signed)
Have her call Dr. Everardo AllEllison her endocrinologist they should have samples since we dont have any

## 2016-08-09 NOTE — Telephone Encounter (Signed)
[place]

## 2016-08-09 NOTE — Telephone Encounter (Signed)
Health dept called and states a voicemail was received by us asking for our fax number # [phone]

## 2016-08-10 NOTE — Telephone Encounter (Signed)
Patient ask you to call her concerning her insulin she stated it was very important.

## 2016-08-10 NOTE — Telephone Encounter (Signed)
Patient stated she got denied her humalog patient assistance and she wanted to know if we could a suggest an alternative? She stated she has plenty of levemir but only one bottle of humalog left. Please advise, Thanks!

## 2016-08-10 NOTE — Telephone Encounter (Signed)
Reg from [facility] is not the same, but interchangeable.

## 2016-08-11 MED ORDER — INSULIN REGULAR HUMAN 100 UNIT/ML IJ SOLN: INTRAMUSCULAR | 2 refills | Status: DC

## 2016-08-11 NOTE — Telephone Encounter (Signed)
I contacted the patient and advised of message. She voiced understanding and agreed to trying the regular insulin from [facility]. Just wanted to confirm, should the prescription read 3 times a day just before each meal 40-40-60? Thanks!

## 2016-08-12 ENCOUNTER — Ambulatory Visit (HOSPITAL_COMMUNITY)
Admission: RE | Admit: 2016-08-12 | Discharge: 2016-08-12 | Disposition: A | Discharge status: Home / Self Care | Payer: Medicare Other | Source: Ambulatory Visit | Attending: Family Medicine | Admitting: Family Medicine

## 2016-08-12 DIAGNOSIS — I081 Rheumatic disorders of both mitral and tricuspid valves: Secondary | ICD-10-CM | POA: Insufficient documentation

## 2016-08-12 DIAGNOSIS — I509 Heart failure, unspecified: Secondary | ICD-10-CM

## 2016-08-12 NOTE — Progress Notes (Signed)
*  PRELIMINARY RESULTS* Echocardiogram 2D Echocardiogram has been performed.  Tina [name], Tina [name] 08/12/2016, 1:29 PM

## 2016-08-19 ENCOUNTER — Encounter: Payer: Self-pay | Admitting: *Deleted

## 2016-08-19 ENCOUNTER — Encounter: Payer: Self-pay | Admitting: Cardiovascular Disease

## 2016-08-19 ENCOUNTER — Ambulatory Visit (INDEPENDENT_AMBULATORY_CARE_PROVIDER_SITE_OTHER): Payer: Medicare Other | Admitting: Cardiovascular Disease

## 2016-08-19 ENCOUNTER — Telehealth: Payer: Self-pay | Admitting: Cardiovascular Disease

## 2016-08-19 VITALS — BP 142/90 | HR 58 | Ht 71.0 in | Wt 199.0 lb

## 2016-08-19 DIAGNOSIS — I10 Essential (primary) hypertension: Secondary | ICD-10-CM | POA: Diagnosis not present

## 2016-08-19 DIAGNOSIS — G4733 Obstructive sleep apnea (adult) (pediatric): Secondary | ICD-10-CM | POA: Diagnosis not present

## 2016-08-19 DIAGNOSIS — I5032 Chronic diastolic (congestive) heart failure: Secondary | ICD-10-CM

## 2016-08-19 DIAGNOSIS — I209 Angina pectoris, unspecified: Secondary | ICD-10-CM

## 2016-08-19 DIAGNOSIS — R0609 Other forms of dyspnea: Secondary | ICD-10-CM

## 2016-08-19 DIAGNOSIS — R0600 Dyspnea, unspecified: Secondary | ICD-10-CM

## 2016-08-19 DIAGNOSIS — E782 Mixed hyperlipidemia: Secondary | ICD-10-CM | POA: Diagnosis not present

## 2016-08-19 NOTE — Patient Instructions (Signed)
Medication Instructions:  Continue all current medications.  Labwork: none  Testing/Procedures:  Your physician has requested that you have a lexiscan myoview. For further information please visit [email or web address]. Please follow instruction sheet, as given.  Office will contact with results via phone or letter.    Referrals:   [facility]  Follow-Up: 6 weeks   Any Other Special Instructions Will Be Listed Below (If Applicable).  If you need a refill on your cardiac medications before your next appointment, please call your pharmacy.

## 2016-08-19 NOTE — Telephone Encounter (Signed)
Pre-cert Verification for the following procedure   Cardiolite Stress test scheduled for 08-30-2016  At [facility].

## 2016-08-19 NOTE — Progress Notes (Signed)
CARDIOLOGY CONSULT NOTE  Patient ID: Tina [name] MRN: [id number] DOB/AGE: 65-May-1952 65 y.o.  Admit date: (Not on file) Primary Physician: Milinda Antis, MD Referring Physician: [facility]  Reason for Consultation: SOB, chest pain, diastolic heart failure  HPI: The patient is a 66 year old woman with a history of insulin-dependent diabetes mellitus, hypertension, sleep apnea, and hyperlipidemia who has been referred by her primary care physician for the evaluation of chest pain and shortness of breath as well as diastolic heart failure.  I reviewed office records and prior studies and labs.  Echocardiogram in 08/12/16: Normal left ventricular systolic function and regional wall motion, LVEF 60-65%, mild LVH, grade 2 diastolic dysfunction, mild left atrial enlargement.  She underwent a normal nuclear stress test on 12/23/11.  I personally reviewed the ECG performed on 07/07/16 which demonstrated normal sinus rhythm with no ischemic ST segment or T-wave abnormalities, nor any arrhythmias.  She has been experiencing chest pain for the last several months. It is located in the upper left chest and she rates it at 5 out of 10. It lasts seconds and described as sharp. There is associated shortness of breath and lightheadedness but she denies associated nausea and vomiting. It is also described as a pressure. The most recent episode occurred while she was lying in bed and watching TV.  She has had exertional dyspnea for several months. She has also had increased weight gain. She denies leg edema, orthopnea, and paroxysmal nocturnal dyspnea. After she was started on Lasix or shortness of breath improved. BNP 68.6 on 07/07/16.  She says she snores a lot and has difficulty sleeping.  She has had near syncope when she became hypoglycemic.  With regards to labs on 07/07/16, lipids showed total cholesterol 227, triglycerides 347, HDL 53, LDL 105. HbA1c 8% on 07/15/16.  CBC was normal on 2/14.  BUN 14, creatinine 0.84.  Carotid Dopplers on 08/08/14 showed less than 50% bilateral internal carotid artery stenosis.  Family history: Sister had CABG at age 87.    Allergies  Allergen Reactions  . Cefuroxime Axetil Other (See Comments)    REACTION: unspecified  . Fentanyl And Related Other (See Comments)    Durgesic Patch: unknown reaction  . Niaspan [Niacin Er] Other (See Comments)    Burning all over  . Sulfur Nausea And Vomiting  . Vasotec [Enalapril] Cough  . Welchol [Colesevelam Hcl] Other (See Comments)    Muscle aches and joint pains  . Lyrica [Pregabalin] Rash and Other (See Comments)    Blistering rash around same time as starting drug  . Penicillins Rash    Current Outpatient Prescriptions  Medication Sig Dispense Refill  . aspirin EC 81 MG tablet Take 81 mg by mouth at bedtime. Reported on 07/03/2015    . cetirizine (ZYRTEC) 10 MG tablet Take 1 tablet (10 mg total) by mouth daily. 90 tablet 1  . chlorhexidine (HIBICLENS) 4 % external liquid Apply topically daily as needed. 120 mL 0  . clotrimazole (LOTRIMIN) 1 % cream Apply 1 application topically 2 (two) times daily. 30 g 0  . clotrimazole-betamethasone (LOTRISONE) cream Apply 1 application topically 2 (two) times daily. 30 g 1  . fenofibrate 54 MG tablet Take 1 tablet (54 mg total) by mouth daily. 90 tablet 3  . furosemide (LASIX) 40 MG tablet Take 1 tablet twice a day 180 tablet 3  . HYDROcodone-acetaminophen (NORCO) 7.5-325 MG tablet Take 1 tablet by mouth every 6 (six) hours as needed for moderate  pain. 45 tablet 0  . insulin detemir (LEVEMIR) 100 UNIT/ML injection Inject 0.55 mLs (55 Units total) into the skin at bedtime. 60 mL 3  . insulin lispro (HUMALOG) 100 UNIT/ML injection 3 times a day (just before each meal) 40-40-60 units 150 mL 3  . insulin regular (NOVOLIN R) 250 units/2.24mL (100 units/mL) injection 3 times a day (just before each meal) 40-40-60 units 40 mL 2  . ipratropium (ATROVENT) 0.02 % nebulizer  solution Take 2.5 mLs (0.5 mg total) by nebulization every 4 (four) hours as needed for wheezing or shortness of breath. 75 mL 3  . ipratropium (ATROVENT) 0.06 % nasal spray Place 2 sprays into both nostrils 4 (four) times daily as needed for rhinitis.    Marland Kitchen losartan (COZAAR) 100 MG tablet Take 1 tablet (100 mg total) by mouth daily. 90 tablet 3  . metoprolol tartrate (LOPRESSOR) 25 MG tablet Take 0.5 tablets (12.5 mg total) by mouth 2 (two) times daily. 90 tablet 3  . montelukast (SINGULAIR) 10 MG tablet Take 1 tablet (10 mg total) by mouth at bedtime. 30 tablet 6  . nystatin (MYCOSTATIN) powder Apply topically 4 (four) times daily. 60 g 2  . Omega-3 Fatty Acids (FISH OIL) 1000 MG CAPS Take 2 capsules by mouth 2 (two) times daily.    Marland Kitchen omeprazole (PRILOSEC) 20 MG capsule TAKE 1 CAPSULE BY MOUTH EVERY DAY 90 capsule 3  . PARoxetine (PAXIL) 40 MG tablet Take 1 tablet (40 mg total) by mouth every morning. 90 tablet 3  . potassium chloride (K-DUR) 10 MEQ tablet Take 1 tablet (10 mEq total) by mouth 2 (two) times daily. 180 tablet 3  . rosuvastatin (CRESTOR) 40 MG tablet Take 1 tablet (40 mg total) by mouth every evening. 90 tablet 3  . TRUEPLUS INSULIN SYRINGE 31G X 5/16" 1 ML MISC USE AS DIRECTED TO INJECT 100 each 2   No current facility-administered medications for this visit.     Past Medical History:  Diagnosis Date  . CHF (congestive heart failure) (HCC)   . Depression   . Diabetes mellitus   . Fibromyalgia   . GERD (gastroesophageal reflux disease)   . Hyperlipidemia   . Hypertension   . Superficial thrombophlebitis    right leg    Past Surgical History:  Procedure Laterality Date  . CHOLECYSTECTOMY  1991  . HERNIA REPAIR  approx 1969  . NECK SURGERY  1998  . TONSILLECTOMY AND ADENOIDECTOMY  1966    Social History   Social History  . Marital status: Married    Spouse name: N/A  . Number of children: N/A  . Years of education: N/A   Occupational History  . Not on file.    Social History Main Topics  . Smoking status: Former Smoker    Quit date: 05/07/2010  . Smokeless tobacco: Never Used  . Alcohol use No  . Drug use: No  . Sexual activity: Not Currently   Other Topics Concern  . Not on file   Social History Narrative  . No narrative on file      Prior to Admission medications   Medication Sig Start Date End Date Taking? Authorizing Provider  aspirin EC 81 MG tablet Take 81 mg by mouth at bedtime. Reported on 07/03/2015    Historical Provider, MD  cetirizine (ZYRTEC) 10 MG tablet Take 1 tablet (10 mg total) by mouth daily. Patient not taking: Reported on 07/16/2016 06/03/16   Salley Scarlet, MD  chlorhexidine (HIBICLENS) 4 %  external liquid Apply topically daily as needed. 04/07/16   Salley Scarlet, MD  clotrimazole (LOTRIMIN) 1 % cream Apply 1 application topically 2 (two) times daily. 08/11/15   Salley Scarlet, MD  clotrimazole-betamethasone (LOTRISONE) cream Apply 1 application topically 2 (two) times daily. 08/01/15   Salley Scarlet, MD  fenofibrate 54 MG tablet Take 1 tablet (54 mg total) by mouth daily. 06/03/16   Salley Scarlet, MD  furosemide (LASIX) 40 MG tablet Take 1 tablet twice a day 07/07/16   Salley Scarlet, MD  HYDROcodone-acetaminophen [facility] 7.5-325 MG tablet Take 1 tablet by mouth every 6 (six) hours as needed for moderate pain. 06/23/16   Salley Scarlet, MD  insulin detemir (LEVEMIR) 100 UNIT/ML injection Inject 0.55 mLs (55 Units total) into the skin at bedtime. 04/12/16   Romero Belling, MD  insulin lispro (HUMALOG) 100 UNIT/ML injection 3 times a day (just before each meal) 40-40-60 units 07/15/16   Romero Belling, MD  insulin regular (NOVOLIN R) 250 units/2.7mL (100 units/mL) injection 3 times a day (just before each meal) 40-40-60 units 08/11/16   Romero Belling, MD  ipratropium (ATROVENT) 0.02 % nebulizer solution Take 2.5 mLs (0.5 mg total) by nebulization every 4 (four) hours as needed for wheezing or shortness of breath.  05/14/15   Patriciaann Clan Dixon, PA-C  ipratropium (ATROVENT) 0.06 % nasal spray Place 2 sprays into both nostrils 4 (four) times daily as needed for rhinitis.    Historical Provider, MD  losartan (COZAAR) 100 MG tablet Take 1 tablet (100 mg total) by mouth daily. 06/03/16   Salley Scarlet, MD  metoprolol tartrate (LOPRESSOR) 25 MG tablet Take 0.5 tablets (12.5 mg total) by mouth 2 (two) times daily. 06/03/16   Salley Scarlet, MD  montelukast (SINGULAIR) 10 MG tablet Take 1 tablet (10 mg total) by mouth at bedtime. 06/03/16   Salley Scarlet, MD  nystatin (MYCOSTATIN) powder Apply topically 4 (four) times daily. 11/12/14   Salley Scarlet, MD  Omega-3 Fatty Acids (FISH OIL) 1000 MG CAPS Take 2 capsules by mouth 2 (two) times daily.    Historical Provider, MD  omeprazole (PRILOSEC) 20 MG capsule TAKE 1 CAPSULE BY MOUTH EVERY DAY 06/03/16   Salley Scarlet, MD  PARoxetine (PAXIL) 40 MG tablet Take 1 tablet (40 mg total) by mouth every morning. 06/23/16   Salley Scarlet, MD  potassium chloride (K-DUR) 10 MEQ tablet Take 1 tablet (10 mEq total) by mouth 2 (two) times daily. 06/03/16   Salley Scarlet, MD  rosuvastatin (CRESTOR) 40 MG tablet Take 1 tablet (40 mg total) by mouth every evening. 06/03/16   Salley Scarlet, MD  TRUEPLUS INSULIN SYRINGE 31G X 5/16" 1 ML MISC USE AS DIRECTED TO INJECT 12/23/14   Salley Scarlet, MD     Review of systems complete and found to be negative unless listed above in HPI     Physical exam Blood pressure (!) 142/90, pulse (!) 58, height 5\' 11"  (1.803 m), weight 199 lb (90.3 kg), SpO2 94 %. General: NAD Neck: No JVD, no thyromegaly or thyroid nodule.  Lungs: Clear to auscultation bilaterally with normal respiratory effort. CV: Nondisplaced PMI. Regular rate and rhythm, normal S1/S2, no S3/S4, no murmur.  No peripheral edema.  No carotid bruit.    Abdomen: Soft, nontender, obese. Skin: Intact without lesions or rashes.  Neurologic: Alert and oriented x 3.  Psych:  Normal affect. Extremities: No clubbing or cyanosis.  HEENT: Normal.  ECG: Most recent ECG reviewed.    Labs:   Lab Results  Component Value Date   WBC 8.7 07/07/2016   HGB 13.9 07/07/2016   HCT 41.5 07/07/2016   MCV 96.7 07/07/2016   PLT 247 07/07/2016   No results for input(s): NA, K, CL, CO2, BUN, CREATININE, CALCIUM, PROT, BILITOT, ALKPHOS, ALT, AST, GLUCOSE in the last 168 hours.  Invalid input(s): LABALBU Lab Results  Component Value Date   CKTOTAL 89 12/07/2011   CKMB 1.8 12/07/2011   TROPONINI <0.30 08/22/2013    Lab Results  Component Value Date   CHOL 227 (H) 07/07/2016   CHOL 381 (H) 04/07/2016   CHOL 213 (H) 12/23/2015   Lab Results  Component Value Date   HDL 53 07/07/2016   HDL 50 (L) 04/07/2016   HDL 53 12/23/2015   Lab Results  Component Value Date   LDLCALC 105 (H) 07/07/2016   LDLCALC NOT CALC 04/07/2016   LDLCALC NOT CALC 12/23/2015   Lab Results  Component Value Date   TRIG 347 (H) 07/07/2016   TRIG 869 (H) 04/07/2016   TRIG 519 (H) 12/23/2015   Lab Results  Component Value Date   CHOLHDL 4.3 07/07/2016   CHOLHDL 7.6 (H) 04/07/2016   CHOLHDL 4.0 12/23/2015   No results found for: LDLDIRECT       Studies: No results found.  ASSESSMENT AND PLAN:  1. Chronic diastolic heart failure: She has grade 2 diastolic dysfunction. Blood pressure control is of optimal importance, as is weight loss. She appears to be euvolemic on Lasix 40 mg twice daily. No changes to therapy.  2. Hypertension: It is mildly elevated. She takes losartan 100 mg daily and metoprolol 12.5 mg twice daily. I would consider chlorthalidone if blood pressure remains elevated.  3. Hyperlipidemia: Lipids reviewed above. They improved with Crestor 40 mg. She needs exercise and weight loss.  4. Obstructive sleep apnea: I will obtain a sleep study. She is markedly fatigued.  5. Angina pectoris: She has numerous cardiovascular risk factors including uncontrolled  diabetes, hypertension, severe hyperlipidemia, and a family history of premature coronary artery disease. I will proceed with a nuclear myocardial perfusion imaging study to evaluate for ischemic heart disease (Lexiscan Myoview).  6. Morbid obesity: Needs increased exercise and dietary modification.   Disposition: fu 6 weeks   Signed: Prentice DockerSuresh Koneswaran, M.D., F.A.C.C.  08/19/2016, 1:34 PM

## 2016-08-30 ENCOUNTER — Encounter (HOSPITAL_COMMUNITY): Admission: RE | Admit: 2016-08-30 | Payer: Medicare Other | Source: Ambulatory Visit

## 2016-08-30 ENCOUNTER — Inpatient Hospital Stay (HOSPITAL_COMMUNITY): Admission: RE | Admit: 2016-08-30 | Payer: Medicare Other | Source: Ambulatory Visit

## 2016-09-03 ENCOUNTER — Telehealth: Payer: Self-pay | Admitting: Family Medicine

## 2016-09-03 MED ORDER — HYDROCODONE-ACETAMINOPHEN 7.5-325 MG PO TABS: 1.0000 | ORAL_TABLET | Freq: Four times a day (QID) | ORAL | 0 refills | Status: DC | PRN

## 2016-09-03 NOTE — Telephone Encounter (Signed)
Okay to refill? 

## 2016-09-03 NOTE — Telephone Encounter (Signed)
Patient is calling to get rx for her hydrocodone  [phone]

## 2016-09-03 NOTE — Telephone Encounter (Signed)
Ok to refill??  Last office visit 07/07/2016.  Last refill 06/23/2016.

## 2016-09-03 NOTE — Telephone Encounter (Signed)
Prescription printed and patient made aware to come to office to pick up on 09/06/2016. 

## 2016-09-09 ENCOUNTER — Other Ambulatory Visit: Payer: Self-pay

## 2016-09-09 MED ORDER — INSULIN LISPRO 100 UNIT/ML ~~LOC~~ SOLN: SUBCUTANEOUS | 3 refills | Status: DC

## 2016-09-13 ENCOUNTER — Ambulatory Visit: Payer: Medicare Other | Admitting: Endocrinology

## 2016-09-13 ENCOUNTER — Ambulatory Visit: Payer: Medicare Other | Admitting: Dietician

## 2016-09-17 ENCOUNTER — Telehealth: Payer: Self-pay | Admitting: Endocrinology

## 2016-09-17 NOTE — Telephone Encounter (Signed)
error 

## 2016-09-22 ENCOUNTER — Ambulatory Visit: Payer: Medicare Other | Admitting: Endocrinology

## 2016-09-30 ENCOUNTER — Ambulatory Visit: Payer: Medicare Other | Admitting: Cardiovascular Disease

## 2016-10-05 ENCOUNTER — Ambulatory Visit (INDEPENDENT_AMBULATORY_CARE_PROVIDER_SITE_OTHER): Payer: Medicare Other | Admitting: Family Medicine

## 2016-10-05 ENCOUNTER — Encounter: Payer: Self-pay | Admitting: Family Medicine

## 2016-10-05 VITALS — BP 140/82 | HR 80 | Temp 98.4°F | Resp 14 | Ht 71.0 in | Wt 197.0 lb

## 2016-10-05 DIAGNOSIS — Z794 Long term (current) use of insulin: Secondary | ICD-10-CM | POA: Diagnosis not present

## 2016-10-05 DIAGNOSIS — E1165 Type 2 diabetes mellitus with hyperglycemia: Secondary | ICD-10-CM | POA: Diagnosis not present

## 2016-10-05 DIAGNOSIS — I10 Essential (primary) hypertension: Secondary | ICD-10-CM

## 2016-10-05 DIAGNOSIS — M797 Fibromyalgia: Secondary | ICD-10-CM

## 2016-10-05 DIAGNOSIS — I5030 Unspecified diastolic (congestive) heart failure: Secondary | ICD-10-CM

## 2016-10-05 DIAGNOSIS — B958 Unspecified staphylococcus as the cause of diseases classified elsewhere: Secondary | ICD-10-CM

## 2016-10-05 DIAGNOSIS — K029 Dental caries, unspecified: Secondary | ICD-10-CM

## 2016-10-05 DIAGNOSIS — L089 Local infection of the skin and subcutaneous tissue, unspecified: Secondary | ICD-10-CM

## 2016-10-05 DIAGNOSIS — IMO0001 Reserved for inherently not codable concepts without codable children: Secondary | ICD-10-CM

## 2016-10-05 MED ORDER — FLUCONAZOLE 150 MG PO TABS: 150.0000 mg | ORAL_TABLET | Freq: Once | ORAL | 0 refills | Status: AC

## 2016-10-05 MED ORDER — HYDROCODONE-ACETAMINOPHEN 7.5-325 MG PO TABS: 1.0000 | ORAL_TABLET | Freq: Four times a day (QID) | ORAL | 0 refills | Status: DC | PRN

## 2016-10-05 MED ORDER — IPRATROPIUM BROMIDE 0.06 % NA SOLN: 2.0000 | Freq: Four times a day (QID) | NASAL | 3 refills | Status: DC | PRN

## 2016-10-05 MED ORDER — CLINDAMYCIN HCL 300 MG PO CAPS: 300.0000 mg | ORAL_CAPSULE | Freq: Three times a day (TID) | ORAL | 0 refills | Status: DC

## 2016-10-05 NOTE — Assessment & Plan Note (Signed)
Continue current meds, pain medication refilled

## 2016-10-05 NOTE — Progress Notes (Signed)
Subjective:    Patient ID: Tina [name], female    DOB: 1950-06-14, 66 y.o.   MRN: [id number]  Patient presents for 3 month F/U (is not fasting) and Tooth Abscess (tooth is needing to be pulled, but she haas not seen DDS at this time- voicing C/O pain in jaw and ear on R side)   Pt here to f/u she is seeing Endocrinology Dr. Everardo AllEllison and nutritionist , has appt with tomorrow, last A1C 8%. Now able to keep both insulin  Cardiology- Dr. Darl HouseholderKoneswaren - last visit treated for CHF- now maintained at 40mg  twice a day of lasix  , concern for sleep apnea but she can not afford the sleep apnea test therefore did not do   Hyperlipdemia- taking crestor regulary  Right upper tooth, has pain into ear and sore on ear for past 2 weeks, has a broken off tooth , planning to make an appointment with dentist , also has sore on side of face history of recurrent staph infections   Review Of Systems:  GEN- denies fatigue, fever, weight loss,weakness, recent illness HEENT- denies eye drainage, change in vision, nasal discharge, CVS- denies chest pain, palpitations RESP- denies SOB, cough, wheeze ABD- denies N/V, change in stools, abd pain GU- denies dysuria, hematuria, dribbling, incontinence MSK- denies joint pain, muscle aches, injury Neuro- denies headache, dizziness, syncope, seizure activity       Objective:    BP 140/82   Pulse 80   Temp 98.4 F (36.9 C) (Oral)   Resp 14   Ht 5\' 11"  (1.803 m)   Wt 197 lb (89.4 kg)   SpO2 96%   BMI 27.48 kg/m  GEN- NAD, alert and oriented x3 HEENT- PERRL, EOMI, non injected sclera, pink conjunctiva, MMM, post oropharynx clear,Swelling upper right gumline, TTP 2nd molar, no pus, no odor Neck- Supple, no thyromegaly CVS- RRR, no murmur RESP-CTAB ABD-NABS,soft,NT,ND Skin- below right ear scab, with mild swelling, mild TTP, scab in upper pinna, no pus, no induration  EXT- No edema Pulses- Radial, DP- 2+        Assessment & Plan:      Problem List  Items Addressed This Visit    Skin infection   Relevant Medications   clindamycin (CLEOCIN) 300 MG capsule   fluconazole (DIFLUCAN) 150 MG tablet   Fibromyalgia    Continue current meds, pain medication refilled       Essential hypertension    Fair control, no change to meds Working on diet      Relevant Orders   CBC with Differential/Platelet   Basic metabolic panel   Lipid panel   Diabetes mellitus type II, uncontrolled (HCC)    Management per endocrine Renal function checked and urine micro today Needs eye appt but unable to go at this time      Relevant Orders   Microalbumin / creatinine urine ratio   Lipid panel   CHF (congestive heart failure) (HCC)    Currently compensated        Other Visit Diagnoses    Tooth decayed    -  Primary   Start clindamycin also with recurrent staph infection on skin, antibacterial soap, sore on same spot as infected tooth as well.  she is schedule with dentist   Relevant Medications   clindamycin (CLEOCIN) 300 MG capsule   fluconazole (DIFLUCAN) 150 MG tablet      Note: This dictation was prepared with Dragon dictation along with smaller phrase technology. Any transcriptional errors that result  from this process are unintentional.

## 2016-10-05 NOTE — Assessment & Plan Note (Signed)
Fair control, no change to meds Working on diet

## 2016-10-05 NOTE — Patient Instructions (Signed)
F/U 6 months for Physical  Take antibiotics Call the dentist

## 2016-10-05 NOTE — Assessment & Plan Note (Signed)
Management per endocrine Renal function checked and urine micro today Needs eye appt but unable to go at this time

## 2016-10-05 NOTE — Assessment & Plan Note (Signed)
Currently compensated 

## 2016-10-06 ENCOUNTER — Ambulatory Visit (INDEPENDENT_AMBULATORY_CARE_PROVIDER_SITE_OTHER): Payer: Medicare Other | Admitting: Endocrinology

## 2016-10-06 ENCOUNTER — Encounter: Payer: Self-pay | Admitting: Endocrinology

## 2016-10-06 VITALS — BP 136/88 | HR 58 | Ht 71.0 in | Wt 200.0 lb

## 2016-10-06 DIAGNOSIS — E1165 Type 2 diabetes mellitus with hyperglycemia: Secondary | ICD-10-CM | POA: Diagnosis not present

## 2016-10-06 DIAGNOSIS — Z794 Long term (current) use of insulin: Secondary | ICD-10-CM | POA: Diagnosis not present

## 2016-10-06 DIAGNOSIS — IMO0001 Reserved for inherently not codable concepts without codable children: Secondary | ICD-10-CM

## 2016-10-06 LAB — BASIC METABOLIC PANEL
BUN: 22 mg/dL (ref 7–25)
CALCIUM: 9.4 mg/dL (ref 8.6–10.4)
CO2: 25 mmol/L (ref 20–31)
Chloride: 100 mmol/L (ref 98–110)
Creat: 1.01 mg/dL — ABNORMAL HIGH (ref 0.50–0.99)
GLUCOSE: 263 mg/dL — AB (ref 70–99)
POTASSIUM: 4.4 mmol/L (ref 3.5–5.3)
SODIUM: 137 mmol/L (ref 135–146)

## 2016-10-06 LAB — LIPID PANEL
CHOL/HDL RATIO: 4.3 ratio (ref <5.0)
CHOLESTEROL: 221 mg/dL — AB (ref <200)
HDL: 52 mg/dL (ref >50)
LDL Cholesterol: 101 mg/dL — ABNORMAL HIGH (ref <100)
TRIGLYCERIDES: 339 mg/dL — AB (ref <150)
VLDL: 68 mg/dL — AB (ref <30)

## 2016-10-06 LAB — POCT GLYCOSYLATED HEMOGLOBIN (HGB A1C): Hemoglobin A1C: 8.6

## 2016-10-06 LAB — CBC WITH DIFFERENTIAL/PLATELET
BASOS PCT: 1 %
Basophils Absolute: 68 cells/uL (ref 0–200)
Eosinophils Absolute: 272 cells/uL (ref 15–500)
Eosinophils Relative: 4 %
HEMATOCRIT: 43.2 % (ref 35.0–45.0)
HEMOGLOBIN: 14.7 g/dL (ref 12.0–15.0)
LYMPHS ABS: 2312 {cells}/uL (ref 850–3900)
Lymphocytes Relative: 34 %
MCH: 33 pg (ref 27.0–33.0)
MCHC: 34 g/dL (ref 32.0–36.0)
MCV: 96.9 fL (ref 80.0–100.0)
MONO ABS: 408 {cells}/uL (ref 200–950)
MPV: 10.8 fL (ref 7.5–12.5)
Monocytes Relative: 6 %
NEUTROS ABS: 3740 {cells}/uL (ref 1500–7800)
Neutrophils Relative %: 55 %
Platelets: 251 10*3/uL (ref 140–400)
RBC: 4.46 MIL/uL (ref 3.80–5.10)
RDW: 14.1 % (ref 11.0–15.0)
WBC: 6.8 10*3/uL (ref 3.8–10.8)

## 2016-10-06 LAB — MICROALBUMIN / CREATININE URINE RATIO
Creatinine, Urine: 189 mg/dL (ref 20–320)
Microalb Creat Ratio: 5 mcg/mg creat (ref <30)
Microalb, Ur: 0.9 mg/dL

## 2016-10-06 NOTE — Progress Notes (Signed)
Subjective:    Patient ID: Tina [name], female    DOB: 1950/11/20, 66 y.o.   MRN: [id number]  HPI Pt returns for f/u of diabetes mellitus: DM type: Insulin-requiring type 2 Dx'ed: 2002 Complications: none Therapy: insulin since 2007 GDM: never DKA: never Severe hypoglycemia: never Pancreatitis: never Other: She gets insulins through health dept; she takes multiple daily injections.   Interval history: She has mild hypoglycemia approx once per month.  This usually happens in the middle of the night, but can happen at any time of day. no cbg record, but states cbg's are in general highest in the afternoon.  Pt says she seldom misses the insulin.  There was a gap in her humalog x approx 3 weeks, due to difficulty getting the insulin.  She has been back on x 3 weeks.  She says the problem has been solved.  Past Medical History:  Diagnosis Date  . CHF (congestive heart failure) (HCC)   . Depression   . Diabetes mellitus   . Fibromyalgia   . GERD (gastroesophageal reflux disease)   . Hyperlipidemia   . Hypertension   . Superficial thrombophlebitis    right leg    Past Surgical History:  Procedure Laterality Date  . CHOLECYSTECTOMY  1991  . HERNIA REPAIR  approx 1969  . NECK SURGERY  1998  . TONSILLECTOMY AND ADENOIDECTOMY  1966    Social History   Social History  . Marital status: Married    Spouse name: N/A  . Number of children: N/A  . Years of education: N/A   Occupational History  . Not on file.   Social History Main Topics  . Smoking status: Former Smoker    Quit date: 05/07/2010  . Smokeless tobacco: Never Used  . Alcohol use No  . Drug use: No  . Sexual activity: Not Currently   Other Topics Concern  . Not on file   Social History Narrative  . No narrative on file    Current Outpatient Prescriptions on File Prior to Visit  Medication Sig Dispense Refill  . aspirin EC 81 MG tablet Take 81 mg by mouth at bedtime. Reported on 07/03/2015    .  cetirizine (ZYRTEC) 10 MG tablet Take 1 tablet (10 mg total) by mouth daily. 90 tablet 1  . chlorhexidine (HIBICLENS) 4 % external liquid Apply topically daily as needed. 120 mL 0  . clindamycin (CLEOCIN) 300 MG capsule Take 1 capsule (300 mg total) by mouth 3 (three) times daily. 21 capsule 0  . clotrimazole (LOTRIMIN) 1 % cream Apply 1 application topically 2 (two) times daily. 30 g 0  . clotrimazole-betamethasone (LOTRISONE) cream Apply 1 application topically 2 (two) times daily. 30 g 1  . fenofibrate 54 MG tablet Take 1 tablet (54 mg total) by mouth daily. 90 tablet 3  . furosemide (LASIX) 40 MG tablet Take 1 tablet twice a day 180 tablet 3  . HYDROcodone-acetaminophen (NORCO) 7.5-325 MG tablet Take 1 tablet by mouth every 6 (six) hours as needed for moderate pain. 45 tablet 0  . insulin detemir (LEVEMIR) 100 UNIT/ML injection Inject 0.55 mLs (55 Units total) into the skin at bedtime. 60 mL 3  . insulin lispro (HUMALOG) 100 UNIT/ML injection 3 times a day (just before each meal) 40-40-60 units 13 vial 3  . ipratropium (ATROVENT) 0.02 % nebulizer solution Take 2.5 mLs (0.5 mg total) by nebulization every 4 (four) hours as needed for wheezing or shortness of breath. 75 mL 3  .  ipratropium (ATROVENT) 0.06 % nasal spray Place 2 sprays into both nostrils 4 (four) times daily as needed for rhinitis. 15 mL 3  . losartan (COZAAR) 100 MG tablet Take 1 tablet (100 mg total) by mouth daily. 90 tablet 3  . metoprolol tartrate (LOPRESSOR) 25 MG tablet Take 0.5 tablets (12.5 mg total) by mouth 2 (two) times daily. 90 tablet 3  . montelukast (SINGULAIR) 10 MG tablet Take 1 tablet (10 mg total) by mouth at bedtime. 30 tablet 6  . nystatin (MYCOSTATIN) powder Apply topically 4 (four) times daily. 60 g 2  . Omega-3 Fatty Acids (FISH OIL) 1000 MG CAPS Take 2 capsules by mouth 2 (two) times daily.    Marland Kitchen omeprazole (PRILOSEC) 20 MG capsule TAKE 1 CAPSULE BY MOUTH EVERY DAY 90 capsule 3  . PARoxetine (PAXIL) 40 MG  tablet Take 1 tablet (40 mg total) by mouth every morning. 90 tablet 3  . potassium chloride (K-DUR) 10 MEQ tablet Take 1 tablet (10 mEq total) by mouth 2 (two) times daily. 180 tablet 3  . rosuvastatin (CRESTOR) 40 MG tablet Take 1 tablet (40 mg total) by mouth every evening. 90 tablet 3  . TRUEPLUS INSULIN SYRINGE 31G X 5/16" 1 ML MISC USE AS DIRECTED TO INJECT 100 each 2   No current facility-administered medications on file prior to visit.     Allergies  Allergen Reactions  . Cefuroxime Axetil Other (See Comments)    REACTION: unspecified  . Fentanyl And Related Other (See Comments)    Durgesic Patch: unknown reaction  . Niaspan [Niacin Er] Other (See Comments)    Burning all over  . Sulfur Nausea And Vomiting  . Vasotec [Enalapril] Cough  . Welchol [Colesevelam Hcl] Other (See Comments)    Muscle aches and joint pains  . Lyrica [Pregabalin] Rash and Other (See Comments)    Blistering rash around same time as starting drug  . Penicillins Rash    Family History  Problem Relation Age of Onset  . Hypertension Mother   . Hyperlipidemia Mother   . Heart disease Mother   . Stroke Sister   . Hypertension Sister   . Hyperlipidemia Sister   . Heart disease Sister   . Diabetes Maternal Grandmother   . Diabetes Daughter     BP 136/88   Pulse (!) 58   Ht 5\' 11"  (1.803 m)   Wt 200 lb (90.7 kg)   SpO2 94%   BMI 27.89 kg/m    Review of Systems Denies LOC    Objective:   Physical Exam VITAL SIGNS:  See vs page GENERAL: no distress Pulses: dorsalis pedis intact bilat.   MSK: no deformity of the feet CV: no leg edema Skin:  no ulcer on the feet.  normal color and temp on the feet. Neuro: sensation is intact to touch on the feet.    A1c=8.6%    Assessment & Plan:  Insulin-requiring type 2 DM: apparently well-controlled back on insulin.  Check fructosamine.   Patient Instructions  check your blood sugar twice a day.  vary the time of day when you check, between  before the 3 meals, and at bedtime.  also check if you have symptoms of your blood sugar being too high or too low.  please keep a record of the readings and bring it to your next appointment here (or you can bring the meter itself).  You can write it on any piece of paper.  please call us sooner if your blood  sugar goes below 70, or if you have a lot of readings over 200.  Please call us if you have difficulty getting your insulin.  A different type of diabetes blood test is requested for you today.  We'll let you know about the results.     Please come back for a follow-up appointment in 2 months.

## 2016-10-06 NOTE — Patient Instructions (Addendum)
check your blood sugar twice a day.  vary the time of day when you check, between before the 3 meals, and at bedtime.  also check if you have symptoms of your blood sugar being too high or too low.  please keep a record of the readings and bring it to your next appointment here (or you can bring the meter itself).  You can write it on any piece of paper.  please call us sooner if your blood sugar goes below 70, or if you have a lot of readings over 200.  Please call us if you have difficulty getting your insulin.  A different type of diabetes blood test is requested for you today.  We'll let you know about the results.     Please come back for a follow-up appointment in 2 months.

## 2016-10-07 ENCOUNTER — Encounter: Payer: Self-pay | Admitting: *Deleted

## 2016-10-11 LAB — FRUCTOSAMINE: Fructosamine: 335 umol/L — ABNORMAL HIGH (ref 190–270)

## 2016-10-26 ENCOUNTER — Telehealth: Payer: Self-pay | Admitting: *Deleted

## 2016-10-26 NOTE — Telephone Encounter (Signed)
Received VM from patient.   Call placed to patient. LMTRC.  

## 2016-10-28 NOTE — Telephone Encounter (Signed)
Call placed to patient. LMTRC.  

## 2016-10-29 MED ORDER — FLUCONAZOLE 150 MG PO TABS: 150.0000 mg | ORAL_TABLET | Freq: Once | ORAL | 0 refills | Status: AC

## 2016-10-29 NOTE — Telephone Encounter (Addendum)
Call placed t patient.   State that she continues to have vaginal irritation.   Prescription sent to pharmacy for Diflucan. Advised that if S/Sx do not resolve after dosage, OV will be required.

## 2016-11-02 ENCOUNTER — Ambulatory Visit: Payer: Self-pay | Admitting: Family Medicine

## 2016-11-03 ENCOUNTER — Ambulatory Visit: Payer: Medicare Other | Admitting: Family Medicine

## 2016-11-04 ENCOUNTER — Telehealth: Payer: Self-pay | Admitting: *Deleted

## 2016-11-04 NOTE — Telephone Encounter (Signed)
Call placed to patient for follow up - stated that she did some research & decided that she did not want to do the stress test or sleep study.  Stated she is feeling fine.  Will call if she needs us in the future.

## 2016-11-07 DIAGNOSIS — I951 Orthostatic hypotension: Secondary | ICD-10-CM | POA: Diagnosis not present

## 2016-11-07 DIAGNOSIS — I509 Heart failure, unspecified: Secondary | ICD-10-CM | POA: Diagnosis not present

## 2016-11-07 DIAGNOSIS — E119 Type 2 diabetes mellitus without complications: Secondary | ICD-10-CM | POA: Diagnosis not present

## 2016-11-07 DIAGNOSIS — Z79899 Other long term (current) drug therapy: Secondary | ICD-10-CM | POA: Diagnosis not present

## 2016-11-07 DIAGNOSIS — R42 Dizziness and giddiness: Secondary | ICD-10-CM | POA: Diagnosis not present

## 2016-11-07 DIAGNOSIS — M797 Fibromyalgia: Secondary | ICD-10-CM | POA: Diagnosis not present

## 2016-11-07 DIAGNOSIS — I110 Hypertensive heart disease with heart failure: Secondary | ICD-10-CM | POA: Diagnosis not present

## 2016-11-07 DIAGNOSIS — E860 Dehydration: Secondary | ICD-10-CM | POA: Diagnosis not present

## 2016-11-07 DIAGNOSIS — Z794 Long term (current) use of insulin: Secondary | ICD-10-CM | POA: Diagnosis not present

## 2016-11-07 DIAGNOSIS — J9811 Atelectasis: Secondary | ICD-10-CM | POA: Diagnosis not present

## 2016-11-07 DIAGNOSIS — N390 Urinary tract infection, site not specified: Secondary | ICD-10-CM | POA: Diagnosis not present

## 2016-11-18 ENCOUNTER — Telehealth: Payer: Self-pay | Admitting: *Deleted

## 2016-11-18 NOTE — Telephone Encounter (Signed)
Received call from patient.    Requested refill on Hydrocodone.   Ok to refill??  Last office visit/ refill 10/05/2016.

## 2016-11-19 MED ORDER — HYDROCODONE-ACETAMINOPHEN 7.5-325 MG PO TABS: 1.0000 | ORAL_TABLET | Freq: Four times a day (QID) | ORAL | 0 refills | Status: DC | PRN

## 2016-11-19 NOTE — Telephone Encounter (Signed)
Okay to refill? 

## 2016-11-19 NOTE — Telephone Encounter (Signed)
Prescription printed and patient made aware to come to office to pick up per VM.  

## 2016-11-23 ENCOUNTER — Ambulatory Visit: Payer: Medicare Other | Admitting: Family Medicine

## 2016-11-26 ENCOUNTER — Encounter: Payer: Self-pay | Admitting: Family Medicine

## 2016-12-08 ENCOUNTER — Ambulatory Visit: Payer: Medicare Other | Admitting: Endocrinology

## 2016-12-08 DIAGNOSIS — Z0289 Encounter for other administrative examinations: Secondary | ICD-10-CM

## 2016-12-15 ENCOUNTER — Ambulatory Visit (INDEPENDENT_AMBULATORY_CARE_PROVIDER_SITE_OTHER): Payer: Medicare Other | Admitting: Family Medicine

## 2016-12-15 ENCOUNTER — Encounter: Payer: Self-pay | Admitting: Family Medicine

## 2016-12-15 VITALS — BP 128/84 | HR 68 | Temp 98.4°F | Resp 14 | Ht 61.0 in | Wt 201.0 lb

## 2016-12-15 DIAGNOSIS — Z794 Long term (current) use of insulin: Secondary | ICD-10-CM

## 2016-12-15 DIAGNOSIS — M797 Fibromyalgia: Secondary | ICD-10-CM

## 2016-12-15 DIAGNOSIS — E1165 Type 2 diabetes mellitus with hyperglycemia: Secondary | ICD-10-CM | POA: Diagnosis not present

## 2016-12-15 DIAGNOSIS — IMO0001 Reserved for inherently not codable concepts without codable children: Secondary | ICD-10-CM

## 2016-12-15 DIAGNOSIS — E782 Mixed hyperlipidemia: Secondary | ICD-10-CM | POA: Diagnosis not present

## 2016-12-15 DIAGNOSIS — Z6838 Body mass index (BMI) 38.0-38.9, adult: Secondary | ICD-10-CM

## 2016-12-15 DIAGNOSIS — I10 Essential (primary) hypertension: Secondary | ICD-10-CM | POA: Diagnosis not present

## 2016-12-15 MED ORDER — CLOTRIMAZOLE-BETAMETHASONE 1-0.05 % EX CREA: 1.0000 "application " | TOPICAL_CREAM | Freq: Two times a day (BID) | CUTANEOUS | 1 refills | Status: DC

## 2016-12-15 MED ORDER — HYDROCODONE-ACETAMINOPHEN 7.5-325 MG PO TABS: 1.0000 | ORAL_TABLET | Freq: Four times a day (QID) | ORAL | 0 refills | Status: DC | PRN

## 2016-12-15 MED ORDER — NYSTATIN 100000 UNIT/GM EX POWD: Freq: Four times a day (QID) | CUTANEOUS | 2 refills | Status: DC

## 2016-12-15 MED ORDER — GABAPENTIN 300 MG PO CAPS: 300.0000 mg | ORAL_CAPSULE | Freq: Every day | ORAL | 3 refills | Status: DC

## 2016-12-15 NOTE — Assessment & Plan Note (Addendum)
Discuss how exercise, also help such as the water aerobics. She is going to try this and [facility]. I will try her again on gabapentin she wants to see how she would do with this it has been about 3 or 4 years since she has been on it. We'll start at 300 mg at bedtime  Her hydrocodone was also refilled.

## 2016-12-15 NOTE — Assessment & Plan Note (Signed)
She has all of her medications/insulin , reiterated importance of compliance.

## 2016-12-15 NOTE — Patient Instructions (Addendum)
Try the gabapentin at bedtime 300mg  at bedtime  Schedule appointment Dr. Everardo AllEllison F/U as previous

## 2016-12-15 NOTE — Assessment & Plan Note (Signed)
She would benefit from weight loss. Discussed importance of the regular exercise and how this affects her other comorbidities.

## 2016-12-15 NOTE — Assessment & Plan Note (Signed)
Continue crestor fasting labs with her endocrinologist coming up

## 2016-12-15 NOTE — Progress Notes (Signed)
   Subjective:    Patient ID: Tina [name], female    DOB: February 21, 1951, 66 y.o.   MRN: [id number]  Patient presents for Medication Review/ Refill (is not fasting) and Neuropathy (would like to discuss gabapentin)   [facility], given Cipro oral.     Endocrinology- Missed her last appointment with Dr. Everardo AllEllison, she is still taking Levemir 100units at bedtime , last A1C was 8.6%. Novolog 40units at meal   She is changing insurance when she turns [age]   Crestor- she is back on regulary   Fibromyalgia - wants to tray gabapentin again ,takes advil and norco when severe,states her pain is getting worse in arms/legs all over, this makes her depressed a times . [facility] [employer]        Review Of Systems:  GEN- denies fatigue, fever, weight loss,weakness, recent illness HEENT- denies eye drainage, change in vision, nasal discharge, CVS- denies chest pain, palpitations RESP- denies SOB, cough, wheeze ABD- denies N/V, change in stools, abd pain GU- denies dysuria, hematuria, dribbling, incontinence MSK- + joint pain, +muscle aches, injury Neuro- denies headache, dizziness, syncope, seizure activity       Objective:    BP 128/84   Pulse 68   Temp 98.4 F (36.9 C) (Oral)   Resp 14   Ht 5\' 1"  (1.549 m)   Wt 201 lb (91.2 kg)   SpO2 98%   BMI 37.98 kg/m  GEN- NAD, alert and oriented x3 HEENT- PERRL, EOMI, non injected sclera, pink conjunctiva, MMM, oropharynx clear Neck- Supple, no thyromegaly CVS- RRR, no murmur RESP-CTAB EXT- No edema Pulses- Radial, DP- 2+        Assessment & Plan:      Problem List Items Addressed This Visit    Obesity    She would benefit from weight loss. Discussed importance of the regular exercise and how this affects her other comorbidities.      Hyperlipidemia    Continue crestor fasting labs with her endocrinologist coming up      Fibromyalgia - Primary    Discuss how exercise, also help such as  the water aerobics. She is going to try this and [facility]. I will try her again on gabapentin she wants to see how she would do with this it has been about 3 or 4 years since she has been on it. We'll start at 300 mg at bedtime  Her hydrocodone was also refilled.      Essential hypertension    Blood pressure is controlled medication medication.      Diabetes mellitus type II, uncontrolled (HCC)    She has all of her medications/insulin , reiterated importance of compliance.         Note: This dictation was prepared with Dragon dictation along with smaller phrase technology. Any transcriptional errors that result from this process are unintentional.

## 2016-12-15 NOTE — Assessment & Plan Note (Signed)
Blood pressure is controlled medication medication.

## 2016-12-16 ENCOUNTER — Telehealth: Payer: Self-pay | Admitting: Endocrinology

## 2016-12-16 NOTE — Telephone Encounter (Signed)
[place] [facility] [facility] [place] to see if it would be possible for patient to get the Novalog Rx instead of the Humalog for insurance purposes. [facility] and advise.

## 2016-12-17 ENCOUNTER — Telehealth: Payer: Self-pay

## 2016-12-17 ENCOUNTER — Other Ambulatory Visit: Payer: Self-pay

## 2016-12-17 DIAGNOSIS — S61511A Laceration without foreign body of right wrist, initial encounter: Secondary | ICD-10-CM | POA: Diagnosis not present

## 2016-12-17 DIAGNOSIS — S61512A Laceration without foreign body of left wrist, initial encounter: Secondary | ICD-10-CM | POA: Diagnosis not present

## 2016-12-17 MED ORDER — INSULIN ASPART 100 UNIT/ML ~~LOC~~ SOLN: SUBCUTANEOUS | 3 refills | Status: DC

## 2016-12-17 NOTE — Telephone Encounter (Signed)
Okay to switch.  Thanks.

## 2016-12-17 NOTE — Telephone Encounter (Signed)
This is done.

## 2016-12-17 NOTE — Telephone Encounter (Signed)
Patient needs Rx to go to health department and not [facility]

## 2016-12-17 NOTE — Telephone Encounter (Signed)
Changed humalog to novolog and sent it to [facility]

## 2016-12-17 NOTE — Telephone Encounter (Signed)
ok 

## 2016-12-23 NOTE — Telephone Encounter (Signed)
Patient is aware. No further action required.

## 2017-01-03 NOTE — Telephone Encounter (Signed)
[place] [employer] called to follow up. She states that she mailed a form to be siged back on July 27th. Advised no notation and to fax. Gave fax #. She states that dr Everardo Allellison will need to sign the document to switch to novalog

## 2017-01-04 NOTE — Telephone Encounter (Signed)
I spoke with Tina [name] & he will be faxing over the form. He also needs new prescription for novolog.

## 2017-01-05 ENCOUNTER — Telehealth: Payer: Self-pay | Admitting: Endocrinology

## 2017-01-05 NOTE — Telephone Encounter (Signed)
Forms have been signed and I am sending them out in the mail today.

## 2017-01-05 NOTE — Telephone Encounter (Signed)
Called patient and left detailed VM that [place] [facility] her novolog prescription. Also that she needed to call back to ASAP to make OV.

## 2017-01-05 NOTE — Telephone Encounter (Signed)
please call patient: Ov is due 

## 2017-01-18 ENCOUNTER — Ambulatory Visit: Payer: Self-pay | Admitting: Family Medicine

## 2017-01-25 ENCOUNTER — Encounter: Payer: Self-pay | Admitting: Family Medicine

## 2017-03-10 ENCOUNTER — Inpatient Hospital Stay (HOSPITAL_COMMUNITY)
Admission: EM | Admit: 2017-03-10 | Discharge: 2017-03-12 | DRG: 300 | Disposition: A | Discharge status: Home / Self Care | Payer: Medicare Other | Attending: Family Medicine | Admitting: Family Medicine

## 2017-03-10 ENCOUNTER — Emergency Department (HOSPITAL_COMMUNITY): Payer: Medicare Other

## 2017-03-10 ENCOUNTER — Encounter (HOSPITAL_COMMUNITY): Payer: Self-pay | Admitting: Emergency Medicine

## 2017-03-10 ENCOUNTER — Telehealth: Payer: Self-pay | Admitting: Family Medicine

## 2017-03-10 DIAGNOSIS — E1165 Type 2 diabetes mellitus with hyperglycemia: Secondary | ICD-10-CM

## 2017-03-10 DIAGNOSIS — Z7982 Long term (current) use of aspirin: Secondary | ICD-10-CM

## 2017-03-10 DIAGNOSIS — N390 Urinary tract infection, site not specified: Secondary | ICD-10-CM | POA: Diagnosis present

## 2017-03-10 DIAGNOSIS — K219 Gastro-esophageal reflux disease without esophagitis: Secondary | ICD-10-CM | POA: Diagnosis not present

## 2017-03-10 DIAGNOSIS — Z79899 Other long term (current) drug therapy: Secondary | ICD-10-CM

## 2017-03-10 DIAGNOSIS — J309 Allergic rhinitis, unspecified: Secondary | ICD-10-CM | POA: Diagnosis not present

## 2017-03-10 DIAGNOSIS — Z888 Allergy status to other drugs, medicaments and biological substances status: Secondary | ICD-10-CM | POA: Diagnosis not present

## 2017-03-10 DIAGNOSIS — Z87891 Personal history of nicotine dependence: Secondary | ICD-10-CM

## 2017-03-10 DIAGNOSIS — I63233 Cerebral infarction due to unspecified occlusion or stenosis of bilateral carotid arteries: Secondary | ICD-10-CM | POA: Diagnosis not present

## 2017-03-10 DIAGNOSIS — F329 Major depressive disorder, single episode, unspecified: Secondary | ICD-10-CM | POA: Diagnosis present

## 2017-03-10 DIAGNOSIS — I110 Hypertensive heart disease with heart failure: Secondary | ICD-10-CM | POA: Diagnosis not present

## 2017-03-10 DIAGNOSIS — G459 Transient cerebral ischemic attack, unspecified: Secondary | ICD-10-CM

## 2017-03-10 DIAGNOSIS — Z880 Allergy status to penicillin: Secondary | ICD-10-CM | POA: Diagnosis not present

## 2017-03-10 DIAGNOSIS — Z794 Long term (current) use of insulin: Secondary | ICD-10-CM

## 2017-03-10 DIAGNOSIS — Z881 Allergy status to other antibiotic agents status: Secondary | ICD-10-CM | POA: Diagnosis not present

## 2017-03-10 DIAGNOSIS — M797 Fibromyalgia: Secondary | ICD-10-CM | POA: Diagnosis not present

## 2017-03-10 DIAGNOSIS — I7774 Dissection of vertebral artery: Secondary | ICD-10-CM | POA: Diagnosis not present

## 2017-03-10 DIAGNOSIS — I509 Heart failure, unspecified: Secondary | ICD-10-CM | POA: Diagnosis not present

## 2017-03-10 DIAGNOSIS — E785 Hyperlipidemia, unspecified: Secondary | ICD-10-CM | POA: Diagnosis not present

## 2017-03-10 DIAGNOSIS — R4781 Slurred speech: Secondary | ICD-10-CM | POA: Diagnosis present

## 2017-03-10 DIAGNOSIS — E119 Type 2 diabetes mellitus without complications: Secondary | ICD-10-CM | POA: Diagnosis not present

## 2017-03-10 DIAGNOSIS — R29818 Other symptoms and signs involving the nervous system: Secondary | ICD-10-CM | POA: Diagnosis not present

## 2017-03-10 LAB — COMPREHENSIVE METABOLIC PANEL
ALK PHOS: 85 U/L (ref 38–126)
ALT: 22 U/L (ref 14–54)
ANION GAP: 8 (ref 5–15)
AST: 22 U/L (ref 15–41)
Albumin: 3.9 g/dL (ref 3.5–5.0)
BILIRUBIN TOTAL: 0.4 mg/dL (ref 0.3–1.2)
BUN: 23 mg/dL — ABNORMAL HIGH (ref 6–20)
CHLORIDE: 106 mmol/L (ref 101–111)
CO2: 29 mmol/L (ref 22–32)
Calcium: 9 mg/dL (ref 8.9–10.3)
Creatinine, Ser: 1.15 mg/dL — ABNORMAL HIGH (ref 0.44–1.00)
GFR calc Af Amer: 57 mL/min — ABNORMAL LOW (ref >60)
GFR, EST NON AFRICAN AMERICAN: 49 mL/min — AB (ref >60)
GLUCOSE: 189 mg/dL — AB (ref 65–99)
POTASSIUM: 3.8 mmol/L (ref 3.5–5.1)
Sodium: 143 mmol/L (ref 135–145)
Total Protein: 7.4 g/dL (ref 6.5–8.1)

## 2017-03-10 LAB — APTT: APTT: 27 s (ref 24–36)

## 2017-03-10 LAB — I-STAT CHEM 8, ED
BUN: 24 mg/dL — ABNORMAL HIGH (ref 6–20)
CALCIUM ION: 1.15 mmol/L (ref 1.15–1.40)
CHLORIDE: 103 mmol/L (ref 101–111)
CREATININE: 1.1 mg/dL — AB (ref 0.44–1.00)
GLUCOSE: 177 mg/dL — AB (ref 65–99)
HCT: 40 % (ref 36.0–46.0)
Hemoglobin: 13.6 g/dL (ref 12.0–15.0)
POTASSIUM: 3.6 mmol/L (ref 3.5–5.1)
Sodium: 142 mmol/L (ref 135–145)
TCO2: 29 mmol/L (ref 22–32)

## 2017-03-10 LAB — DIFFERENTIAL
BASOS ABS: 0 10*3/uL (ref 0.0–0.1)
Basophils Relative: 0 %
Eosinophils Absolute: 0.1 10*3/uL (ref 0.0–0.7)
Eosinophils Relative: 1 %
LYMPHS ABS: 2.7 10*3/uL (ref 0.7–4.0)
LYMPHS PCT: 28 %
MONOS PCT: 7 %
Monocytes Absolute: 0.7 10*3/uL (ref 0.1–1.0)
NEUTROS ABS: 6 10*3/uL (ref 1.7–7.7)
Neutrophils Relative %: 64 %

## 2017-03-10 LAB — I-STAT TROPONIN, ED: TROPONIN I, POC: 0 ng/mL (ref 0.00–0.08)

## 2017-03-10 LAB — URINALYSIS, ROUTINE W REFLEX MICROSCOPIC
BILIRUBIN URINE: NEGATIVE
Glucose, UA: 50 mg/dL — AB
Hgb urine dipstick: NEGATIVE
KETONES UR: NEGATIVE mg/dL
Nitrite: POSITIVE — AB
PH: 5 (ref 5.0–8.0)
PROTEIN: 30 mg/dL — AB
Specific Gravity, Urine: 1.025 (ref 1.005–1.030)

## 2017-03-10 LAB — CBC
HCT: 39.6 % (ref 36.0–46.0)
HEMOGLOBIN: 13.4 g/dL (ref 12.0–15.0)
MCH: 33.4 pg (ref 26.0–34.0)
MCHC: 33.8 g/dL (ref 30.0–36.0)
MCV: 98.8 fL (ref 78.0–100.0)
Platelets: 217 10*3/uL (ref 150–400)
RBC: 4.01 MIL/uL (ref 3.87–5.11)
RDW: 13.3 % (ref 11.5–15.5)
WBC: 9.5 10*3/uL (ref 4.0–10.5)

## 2017-03-10 LAB — ETHANOL: Alcohol, Ethyl (B): <10 mg/dL (ref <10)

## 2017-03-10 LAB — CBG MONITORING, ED: GLUCOSE-CAPILLARY: 170 mg/dL — AB (ref 65–99)

## 2017-03-10 LAB — PROTIME-INR
INR: 0.96
Prothrombin Time: 12.7 seconds (ref 11.4–15.2)

## 2017-03-10 LAB — RAPID URINE DRUG SCREEN, HOSP PERFORMED
Amphetamines: NOT DETECTED
BARBITURATES: NOT DETECTED
BENZODIAZEPINES: NOT DETECTED
COCAINE: NOT DETECTED
OPIATES: NOT DETECTED
Tetrahydrocannabinol: NOT DETECTED

## 2017-03-10 MED ORDER — LOSARTAN POTASSIUM 50 MG PO TABS
100.0000 mg | ORAL_TABLET | Freq: Every day | ORAL | Status: DC
  Administered 2017-03-11 – 2017-03-12 (×2): 100 mg via ORAL
  Filled 2017-03-10 (×2): qty 2

## 2017-03-10 MED ORDER — METOPROLOL TARTRATE 25 MG PO TABS
12.5000 mg | ORAL_TABLET | Freq: Two times a day (BID) | ORAL | Status: DC
  Administered 2017-03-11 – 2017-03-12 (×3): 12.5 mg via ORAL
  Filled 2017-03-10 (×3): qty 1

## 2017-03-10 MED ORDER — GABAPENTIN 300 MG PO CAPS
300.0000 mg | ORAL_CAPSULE | Freq: Every day | ORAL | Status: DC
  Administered 2017-03-11 (×2): 300 mg via ORAL
  Filled 2017-03-10 (×2): qty 1

## 2017-03-10 MED ORDER — ONDANSETRON HCL 4 MG/2ML IJ SOLN: 4.0000 mg | Freq: Four times a day (QID) | INTRAMUSCULAR | Status: DC | PRN

## 2017-03-10 MED ORDER — HEPARIN (PORCINE) IN NACL 100-0.45 UNIT/ML-% IJ SOLN
1150.0000 [IU]/h | INTRAMUSCULAR | Status: DC
  Administered 2017-03-10: 900 [IU]/h via INTRAVENOUS
  Administered 2017-03-11: 1200 [IU]/h via INTRAVENOUS
  Filled 2017-03-10 (×2): qty 250

## 2017-03-10 MED ORDER — ONDANSETRON HCL 4 MG PO TABS: 4.0000 mg | ORAL_TABLET | Freq: Four times a day (QID) | ORAL | Status: DC | PRN

## 2017-03-10 MED ORDER — IPRATROPIUM-ALBUTEROL 0.5-2.5 (3) MG/3ML IN SOLN
3.0000 mL | Freq: Three times a day (TID) | RESPIRATORY_TRACT | Status: DC
  Administered 2017-03-11: 3 mL via RESPIRATORY_TRACT
  Filled 2017-03-10: qty 3

## 2017-03-10 MED ORDER — PAROXETINE HCL 20 MG PO TABS
40.0000 mg | ORAL_TABLET | Freq: Every morning | ORAL | Status: DC
  Administered 2017-03-11 – 2017-03-12 (×2): 40 mg via ORAL
  Filled 2017-03-10 (×2): qty 2

## 2017-03-10 MED ORDER — INSULIN DETEMIR 100 UNIT/ML ~~LOC~~ SOLN
55.0000 [IU] | Freq: Every day | SUBCUTANEOUS | Status: DC
  Administered 2017-03-11 (×2): 55 [IU] via SUBCUTANEOUS
  Filled 2017-03-10 (×3): qty 0.55

## 2017-03-10 MED ORDER — FENOFIBRATE 54 MG PO TABS
54.0000 mg | ORAL_TABLET | Freq: Every day | ORAL | Status: DC
  Administered 2017-03-11 – 2017-03-12 (×2): 54 mg via ORAL
  Filled 2017-03-10 (×4): qty 1

## 2017-03-10 MED ORDER — HYDROCODONE-ACETAMINOPHEN 7.5-325 MG PO TABS
1.0000 | ORAL_TABLET | Freq: Four times a day (QID) | ORAL | Status: DC | PRN
  Administered 2017-03-11: 1 via ORAL
  Filled 2017-03-10: qty 1

## 2017-03-10 MED ORDER — FLUTICASONE PROPIONATE 50 MCG/ACT NA SUSP
2.0000 | Freq: Every day | NASAL | Status: DC
  Administered 2017-03-11 (×2): 2 via NASAL
  Filled 2017-03-10 (×2): qty 16

## 2017-03-10 MED ORDER — IOPAMIDOL (ISOVUE-370) INJECTION 76%
80.0000 mL | Freq: Once | INTRAVENOUS | Status: AC | PRN
  Administered 2017-03-10: 80 mL via INTRAVENOUS

## 2017-03-10 MED ORDER — SODIUM CHLORIDE 0.9 % IV SOLN
INTRAVENOUS | Status: DC
  Administered 2017-03-10: 21:00:00 via INTRAVENOUS

## 2017-03-10 MED ORDER — LORATADINE 10 MG PO TABS
10.0000 mg | ORAL_TABLET | Freq: Every day | ORAL | Status: DC
  Administered 2017-03-11 – 2017-03-12 (×2): 10 mg via ORAL
  Filled 2017-03-10 (×2): qty 1

## 2017-03-10 MED ORDER — CIPROFLOXACIN IN D5W 400 MG/200ML IV SOLN
400.0000 mg | Freq: Once | INTRAVENOUS | Status: AC
  Administered 2017-03-10: 400 mg via INTRAVENOUS
  Filled 2017-03-10: qty 200

## 2017-03-10 MED ORDER — IPRATROPIUM-ALBUTEROL 0.5-2.5 (3) MG/3ML IN SOLN
3.0000 mL | Freq: Four times a day (QID) | RESPIRATORY_TRACT | Status: DC
  Administered 2017-03-10: 3 mL via RESPIRATORY_TRACT
  Filled 2017-03-10: qty 3

## 2017-03-10 MED ORDER — PANTOPRAZOLE SODIUM 40 MG PO TBEC
40.0000 mg | DELAYED_RELEASE_TABLET | Freq: Every day | ORAL | Status: DC
  Administered 2017-03-11 – 2017-03-12 (×2): 40 mg via ORAL
  Filled 2017-03-10 (×2): qty 1

## 2017-03-10 MED ORDER — ROSUVASTATIN CALCIUM 20 MG PO TABS
40.0000 mg | ORAL_TABLET | Freq: Every evening | ORAL | Status: DC
  Administered 2017-03-11: 40 mg via ORAL
  Filled 2017-03-10: qty 2
  Filled 2017-03-10 (×3): qty 1

## 2017-03-10 MED ORDER — FUROSEMIDE 40 MG PO TABS: 40.0000 mg | ORAL_TABLET | Freq: Two times a day (BID) | ORAL | Status: DC | PRN

## 2017-03-10 MED ORDER — SODIUM CHLORIDE 0.9 % IV SOLN
INTRAVENOUS | Status: DC
  Administered 2017-03-11: 01:00:00 via INTRAVENOUS

## 2017-03-10 MED ORDER — ASPIRIN EC 81 MG PO TBEC
81.0000 mg | DELAYED_RELEASE_TABLET | Freq: Every day | ORAL | Status: DC
  Administered 2017-03-11 (×2): 81 mg via ORAL
  Filled 2017-03-10 (×2): qty 1

## 2017-03-10 MED ORDER — MONTELUKAST SODIUM 10 MG PO TABS
10.0000 mg | ORAL_TABLET | Freq: Every day | ORAL | Status: DC
  Administered 2017-03-11 (×2): 10 mg via ORAL
  Filled 2017-03-10 (×2): qty 1

## 2017-03-10 MED ORDER — OMEGA-3-ACID ETHYL ESTERS 1 G PO CAPS
2.0000 g | ORAL_CAPSULE | Freq: Two times a day (BID) | ORAL | Status: DC
  Administered 2017-03-11 – 2017-03-12 (×3): 2 g via ORAL
  Filled 2017-03-10 (×3): qty 2

## 2017-03-10 NOTE — ED Notes (Signed)
Treatment decision 1815.

## 2017-03-10 NOTE — ED Notes (Signed)
ED Provider at bedside. 

## 2017-03-10 NOTE — ED Notes (Signed)
SOC called.  Camera in the rm.

## 2017-03-10 NOTE — Telephone Encounter (Signed)
Pt calling from her car.  [place] [employer] [place] husband to come out.  Sudden onset of slurred speech, "not feeling well" lower ext trembling.  Thinks she may be having a stroke.  Waited on phone until husband returned and told him to call 911 or she must be taken to the hospital right now!!!!  Her speech was very slurred.

## 2017-03-10 NOTE — ED Triage Notes (Signed)
Pt came out of store, hudband noticed that pt face was drooped, slurred speech. LNW 1646. [facility] stroke

## 2017-03-10 NOTE — ED Notes (Signed)
Teleneuro at BS 

## 2017-03-10 NOTE — Progress Notes (Addendum)
CODE STROKE Call time 1741  Beeper time 1741 Exam started 1752 Exam ended 1754 [facility] Completed in epic 1757 Called  rad 1758 spoke with Tina Loserhonda

## 2017-03-10 NOTE — H&P (Signed)
TRH H&P    Patient Demographics:    Tina [name], is a 66 y.o. female  MRN: [id number]  DOB - July 27, 1950  Admit Date - 03/10/2017  Referring MD/NP/PA: Dr. Clarene Duke  Outpatient Primary MD for the patient is [facility], Velna Hatchet, MD  Patient coming from: home  Chief Complaint  Patient presents with  . Code Stroke      HPI:    Tina [name]  is a 66 y.o. female, with history of hypertension, diabetes mellitus, fibromyalgia, GERD, hyperlipidemia, hypertension, CHF who was brought to hospital by family due to sudden onset of strokelike symptoms. Patient says that she was in a car stopped at a store, she did not want to go on the story she did not feel well. Administrator and family arrived this patient has slurred speech and was talking on phone with her PCP. PCP told patient to go to ED for further evaluation. Possible stroke.  Patient says that she also had blurred vision. Denies focal weakness of extremities No numbness or tingling. She denies chest pain or shortness of breath. Complains of stuffy nose. Does have allergies No nausea vomiting or diarrhea. She denies dysuria, urgency or frequency of urination.  In the ED, code stroke was called. [facility] evaluated the patient, and found that she is not [place] today due to rapidly improving symptoms. NIH 0 as per neurologist. CTA head and neck was recommended.  CT showed vascular dissection, Dr. Clarene Duke called and discussed with vascular surgery Dr. Imogene Burn, who recommended patient to start on IV heparin and transition to Coumadin before discharge and follow-up in his office.  Patient has been started on IV heparin.    Review of systems:      All other systems reviewed and are negative.   With Past History of the following :    Past Medical History:  Diagnosis Date  . CHF (congestive heart failure) (HCC)   . Depression   . Diabetes mellitus     . Fibromyalgia   . GERD (gastroesophageal reflux disease)   . Hyperlipidemia   . Hypertension   . Superficial thrombophlebitis    right leg      Past Surgical History:  Procedure Laterality Date  . CHOLECYSTECTOMY  1991  . HERNIA REPAIR  approx 1969  . NECK SURGERY  1998  . TONSILLECTOMY AND ADENOIDECTOMY  1966      Social History:      Social History  Substance Use Topics  . Smoking status: Former Smoker    Quit date: 05/07/2010  . Smokeless tobacco: Never Used  . Alcohol use No       Family History :     Family History  Problem Relation Age of Onset  . Hypertension Mother   . Hyperlipidemia Mother   . Heart disease Mother   . Stroke Sister   . Hypertension Sister   . Hyperlipidemia Sister   . Heart disease Sister   . Diabetes Maternal Grandmother   . Diabetes Daughter       Home Medications:  Prior to Admission medications   Medication Sig Start Date End Date Taking? Authorizing Provider  aspirin EC 81 MG tablet Take 81 mg by mouth at bedtime. Reported on 07/03/2015   Yes [provider]  cetirizine (ZYRTEC) 10 MG tablet Take 1 tablet (10 mg total) by mouth daily. 06/03/16  Yes [place], Velna Hatchet, MD  chlorhexidine (HIBICLENS) 4 % external liquid Apply topically daily as needed. 04/07/16  Yes [place], Velna Hatchet, MD  clotrimazole (LOTRIMIN) 1 % cream Apply 1 application topically 2 (two) times daily. 08/11/15  Yes [place], Velna Hatchet, MD  clotrimazole-betamethasone (LOTRISONE) cream Apply 1 application topically 2 (two) times daily. 12/15/16  Yes [place], Velna Hatchet, MD  fenofibrate 54 MG tablet Take 1 tablet (54 mg total) by mouth daily. 06/03/16  Yes [place], Velna Hatchet, MD  furosemide (LASIX) 40 MG tablet Take 1 tablet twice a day Patient taking differently: Take 40 mg by mouth 2 (two) times daily as needed for fluid. Take 1 tablet twice a day 07/07/16  Yes [place], Velna Hatchet, MD  gabapentin (NEURONTIN) 300 MG capsule Take 1 capsule (300 mg total) by mouth  at bedtime. 12/15/16  Yes [place], Velna Hatchet, MD  HYDROcodone-acetaminophen (NORCO) 7.5-325 MG tablet Take 1 tablet by mouth every 6 (six) hours as needed for moderate pain. 12/15/16  Yes [place], Velna Hatchet, MD  insulin aspart (NOVOLOG) 100 UNIT/ML injection Inject 40 units before breakfast & lunch and 60 units at supper 12/17/16  Yes Romero Belling, MD  insulin detemir (LEVEMIR) 100 UNIT/ML injection Inject 0.55 mLs (55 Units total) into the skin at bedtime. 04/12/16  Yes Romero Belling, MD  ipratropium (ATROVENT) 0.02 % nebulizer solution Take 2.5 mLs (0.5 mg total) by nebulization every 4 (four) hours as needed for wheezing or shortness of breath. 05/14/15  Yes Allayne Butcher B, PA-C  ipratropium (ATROVENT) 0.06 % nasal spray Place 2 sprays into both nostrils 4 (four) times daily as needed for rhinitis. 10/05/16  Yes [place], Velna Hatchet, MD  losartan (COZAAR) 100 MG tablet Take 1 tablet (100 mg total) by mouth daily. 06/03/16  Yes [place], Velna Hatchet, MD  metoprolol tartrate (LOPRESSOR) 25 MG tablet Take 0.5 tablets (12.5 mg total) by mouth 2 (two) times daily. 06/03/16  Yes [place], Velna Hatchet, MD  montelukast (SINGULAIR) 10 MG tablet Take 1 tablet (10 mg total) by mouth at bedtime. 06/03/16  Yes [place], Velna Hatchet, MD  nystatin (MYCOSTATIN/NYSTOP) powder Apply topically 4 (four) times daily. 12/15/16  Yes  [place], Velna Hatchet, MD  Omega-3 Fatty Acids (FISH OIL) 1000 MG CAPS Take 2 capsules by mouth 2 (two) times daily.   Yes [provider]  omeprazole (PRILOSEC) 20 MG capsule TAKE 1 CAPSULE BY MOUTH EVERY DAY 06/03/16  Yes [place], Velna Hatchet, MD  PARoxetine (PAXIL) 40 MG tablet Take 1 tablet (40 mg total) by mouth every morning. 06/23/16  Yes [place], Velna Hatchet, MD  potassium chloride (K-DUR) 10 MEQ tablet Take 1 tablet (10 mEq total) by mouth 2 (two) times daily. Patient taking differently: Take 10 mEq by mouth 2 (two) times daily as needed (when taking lasix).  06/03/16  Yes [place], Velna Hatchet, MD  rosuvastatin  (CRESTOR) 40 MG tablet Take 1 tablet (40 mg total) by mouth every evening. 06/03/16  Yes [place], Velna Hatchet, MD  TRUEPLUS INSULIN SYRINGE 31G X 5/16" 1 ML MISC USE AS DIRECTED TO INJECT 12/23/14  Yes , Velna Hatchet, MD  insulin lispro (HUMALOG) 100 UNIT/ML injection 3 times a day (just before  each meal) 40-40-60 units Patient not taking: Reported on 03/10/2017 09/09/16   Romero Belling, MD     Allergies:     Allergies  Allergen Reactions  . Cefuroxime Axetil Other (See Comments)    REACTION: unspecified  . Fentanyl And Related Other (See Comments)    Durgesic Patch: unknown reaction  . Niaspan [Niacin Er] Other (See Comments)    Burning all over  . Sulfur Nausea And Vomiting  . Vasotec [Enalapril] Cough  . Welchol [Colesevelam Hcl] Other (See Comments)    Muscle aches and joint pains  . Lyrica [Pregabalin] Rash and Other (See Comments)    Blistering rash around same time as starting drug  . Penicillins Rash     Physical Exam:   Vitals  Blood pressure 132/81, pulse 98, temperature (!) 97.5 F (36.4 C), temperature source Oral, resp. rate 19, height 4\' 11"  (1.499 m), weight 90.7 kg (200 lb), SpO2 95 %.  1.  General: Appears in no acute distress  2. Psychiatric:  Intact judgement and  insight, awake alert, oriented x 3.  3. Neurologic: No focal neurological deficits, all cranial nerves intact.Strength 5/5 all 4 extremities, sensation intact all 4 extremities, plantars down going.  4. Eyes :  anicteric sclerae, moist conjunctivae with no lid lag. PERRLA.  5. ENMT:  Oropharynx clear with moist mucous membranes and good dentition  6. Neck:  supple, no cervical lymphadenopathy appriciated, No thyromegaly  7. Respiratory : Normal respiratory effort, good air movement bilaterally,clear to  auscultation bilaterally  8. Cardiovascular : RRR, no gallops, rubs or murmurs, no leg edema  9. Gastrointestinal:  Positive bowel sounds, abdomen soft, non-tender to palpation,no  hepatosplenomegaly, no rigidity or guarding       10. Skin:  No cyanosis, normal texture and turgor, no rash, lesions or ulcers  11.Musculoskeletal:  Good muscle tone,  joints appear normal , no effusions,  normal range of motion    Data Review:    CBC  Recent Labs Lab 03/10/17 1740 03/10/17 1801  WBC 9.5  --   HGB 13.4 13.6  HCT 39.6 40.0  PLT 217  --   MCV 98.8  --   MCH 33.4  --   MCHC 33.8  --   RDW 13.3  --   LYMPHSABS 2.7  --   MONOABS 0.7  --   EOSABS 0.1  --   BASOSABS 0.0  --    ------------------------------------------------------------------------------------------------------------------  Chemistries   Recent Labs Lab 03/10/17 1740 03/10/17 1801  NA 143 142  K 3.8 3.6  CL 106 103  CO2 29  --   GLUCOSE 189* 177*  BUN 23* 24*  CREATININE 1.15* 1.10*  CALCIUM 9.0  --   AST 22  --   ALT 22  --   ALKPHOS 85  --   BILITOT 0.4  --    ------------------------------------------------------------------------------------------------------------------  ------------------------------------------------------------------------------------------------------------------ GFR: Estimated Creatinine Clearance: 50.1 mL/min (A) (by C-G formula based on SCr of 1.1 mg/dL (H)). Liver Function Tests:  Recent Labs Lab 03/10/17 1740  AST 22  ALT 22  ALKPHOS 85  BILITOT 0.4  PROT 7.4  ALBUMIN 3.9   No results for input(s): LIPASE, AMYLASE in the last 168 hours. No results for input(s): AMMONIA in the last 168 hours. Coagulation Profile:  Recent Labs Lab 03/10/17 1740  INR 0.96   Cardiac Enzymes: No results for input(s): CKTOTAL, CKMB, CKMBINDEX, TROPONINI in the last 168 hours. BNP (last 3 results) No results for input(s): PROBNP in the last  8760 hours. HbA1C: No results for input(s): HGBA1C in the last 72 hours. CBG:  Recent Labs Lab 03/10/17 1750  GLUCAP 170*   Lipid Profile: No results for input(s): CHOL, HDL, LDLCALC, TRIG, CHOLHDL,  LDLDIRECT in the last 72 hours. Thyroid Function Tests: No results for input(s): TSH, T4TOTAL, FREET4, T3FREE, THYROIDAB in the last 72 hours. Anemia Panel: No results for input(s): VITAMINB12, FOLATE, FERRITIN, TIBC, IRON, RETICCTPCT in the last 72 hours.  --------------------------------------------------------------------------------------------------------------- Urine analysis:    Component Value Date/Time   COLORURINE YELLOW 03/10/2017 1752   APPEARANCEUR HAZY (A) 03/10/2017 1752   LABSPEC 1.025 03/10/2017 1752   PHURINE 5.0 03/10/2017 1752   GLUCOSEU 50 (A) 03/10/2017 1752   HGBUR NEGATIVE 03/10/2017 1752   BILIRUBINUR NEGATIVE 03/10/2017 1752   KETONESUR NEGATIVE 03/10/2017 1752   PROTEINUR 30 (A) 03/10/2017 1752   UROBILINOGEN 0.2 09/14/2013 1508   NITRITE POSITIVE (A) 03/10/2017 1752   LEUKOCYTESUR SMALL (A) 03/10/2017 1752      Imaging Results:    Ct Angio Head W Or Wo Contrast  Result Date: 03/10/2017 CLINICAL DATA:  Facial droop and slurred speech after shopping. Follow-up code stroke. EXAM: CT ANGIOGRAPHY HEAD AND NECK TECHNIQUE: Multidetector CT imaging of the head and neck was performed using the standard protocol during bolus administration of intravenous contrast. Multiplanar CT image reconstructions and MIPs were obtained to evaluate the vascular anatomy. Carotid stenosis measurements (when applicable) are obtained utilizing NASCET criteria, using the distal internal carotid diameter as the denominator. CONTRAST:  80 cc Isovue 370 COMPARISON:  CT HEAD March 10, 2017 at 1753 hours and carotid ultrasound August 08, 2014 FINDINGS: CTA NECK AORTIC ARCH: Normal appearance of the thoracic arch, 2 vessel arch is a normal variant. The origins of the innominate, left Common carotid artery and subclavian artery are widely patent. RIGHT CAROTID SYSTEM: Common carotid artery is widely patent, coursing in a straight line fashion. Eccentric intimal thickening under lesser sac  calcific atherosclerosis result in 60% stenosis RIGHT internal carotid artery origin. LEFT CAROTID SYSTEM: Common carotid artery is widely patent, coursing in a straight line fashion. Calcific atherosclerosis LEFT vertebral artery resulting in less than 50% stenosis LEFT internal carotid artery origin. VERTEBRAL ARTERIES:Left vertebral artery is dominant. Venous reflux obscuring RIGHT V1 segment. 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 Thready contrast opacification through the RIGHT V4 segment. SKELETON: No acute osseous process though bone windows have not been submitted. C4 through C7 ACDF, C6-7 pseudoarthrosis. C7 screws within C7-T1 disc space. Mild canal stenosis C4-5. OTHER NECK: Soft tissues of the neck are nonacute though, not tailored for evaluation. UPPER CHEST: Pulmonary vascular congestion and heterogeneous lung attenuation suggesting pulmonary edema. Subcentimeter short access mediastinal lymph nodes are likely reactive. CTA HEAD ANTERIOR CIRCULATION: Patent cervical internal carotid arteries, petrous, cavernous and supra clinoid internal carotid arteries. Fenestrated patent anterior communicating artery. Patent anterior and middle cerebral arteries. No large vessel occlusion, significant stenosis, contrast extravasation or aneurysm. POSTERIOR CIRCULATION: Occluded RIGHT V4 segment. Bilateral posterior inferior cerebral artery's are patent with reconstitution RIGHT distal V4 segment, possible retrograde flow. Calcific atherosclerosis resulting in mild LEFT V4 stenosis. Severe stenosis LEFT superior cerebellar artery origin. Small LEFT posterior communicating artery present. Patent posterior cerebral artery's. Moderate stenosis distal LEFT P1 segment. Mild stenosis RIGHT P2 segment. No  contrast extravasation or aneurysm. VENOUS SINUSES: Major dural venous sinuses are patent though not tailored for evaluation on this angiographic examination. ANATOMIC  VARIANTS: None. DELAYED PHASE: No abnormal intracranial enhancement. MIP images  reviewed. IMPRESSION: CTA NECK: 1. Flow limiting RIGHT vertebral artery dissection, potentially acute. 2. 60% RIGHT internal carotid artery origin stenosis, less than 50% LEFT internal carotid artery origin stenosis. 3. 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 CTA HEAD: 1. Occluded proximal RIGHT V4 segment. Retrograde flow/reconstitution distal RIGHT V4 segment. 2. 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 3. Patent cerebral artery's without severe stenosis. Moderate stenosis LEFT P1 segment. Critical Value/emergent results were called by telephone at the time of interpretation on 03/10/2017 at 8:05 pm to Dr. Samuel Jester , who verbally acknowledged these results. Electronically Signed   By: Awilda Metro M.D.   On: 03/10/2017 20:08   Ct Angio Neck W And/or Wo Contrast  Result Date: 03/10/2017 CLINICAL DATA:  Facial droop and slurred speech after shopping. Follow-up code stroke. EXAM: CT ANGIOGRAPHY HEAD AND NECK TECHNIQUE: Multidetector CT imaging of the head and neck was performed using the standard protocol during bolus administration of intravenous contrast. Multiplanar CT image reconstructions and MIPs were obtained to evaluate the vascular anatomy. Carotid stenosis measurements (when applicable) are obtained utilizing NASCET criteria, using the distal internal carotid diameter as the denominator. CONTRAST:  80 cc Isovue 370 COMPARISON:  CT HEAD March 10, 2017 at 1753 hours and carotid ultrasound August 08, 2014 FINDINGS: CTA NECK AORTIC ARCH: Normal appearance of the thoracic arch, 2 vessel arch is a normal variant. The origins of the innominate, left Common carotid artery and subclavian artery are widely patent. RIGHT CAROTID SYSTEM: Common carotid artery is widely patent, coursing in a straight line fashion. Eccentric intimal thickening under lesser sac calcific  atherosclerosis result in 60% stenosis RIGHT internal carotid artery origin. LEFT CAROTID SYSTEM: Common carotid artery is widely patent, coursing in a straight line fashion. Calcific atherosclerosis LEFT vertebral artery resulting in less than 50% stenosis LEFT internal carotid artery origin. VERTEBRAL ARTERIES:Left vertebral artery is dominant. Venous reflux obscuring RIGHT V1 segment. 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 Thready contrast opacification through the RIGHT V4 segment. SKELETON: No acute osseous process though bone windows have not been submitted. C4 through C7 ACDF, C6-7 pseudoarthrosis. C7 screws within C7-T1 disc space. Mild canal stenosis C4-5. OTHER NECK: Soft tissues of the neck are nonacute though, not tailored for evaluation. UPPER CHEST: Pulmonary vascular congestion and heterogeneous lung attenuation suggesting pulmonary edema. Subcentimeter short access mediastinal lymph nodes are likely reactive. CTA HEAD ANTERIOR CIRCULATION: Patent cervical internal carotid arteries, petrous, cavernous and supra clinoid internal carotid arteries. Fenestrated patent anterior communicating artery. Patent anterior and middle cerebral arteries. No large vessel occlusion, significant stenosis, contrast extravasation or aneurysm. POSTERIOR CIRCULATION: Occluded RIGHT V4 segment. Bilateral posterior inferior cerebral artery's are patent with reconstitution RIGHT distal V4 segment, possible retrograde flow. Calcific atherosclerosis resulting in mild LEFT V4 stenosis. Severe stenosis LEFT superior cerebellar artery origin. Small LEFT posterior communicating artery present. Patent posterior cerebral artery's. Moderate stenosis distal LEFT P1 segment. Mild stenosis RIGHT P2 segment. No  contrast extravasation or aneurysm. VENOUS SINUSES: Major dural venous sinuses are patent though not tailored for evaluation on this angiographic examination. ANATOMIC VARIANTS:  None. DELAYED PHASE: No abnormal intracranial enhancement. MIP images reviewed. IMPRESSION: CTA NECK: 1. Flow limiting RIGHT vertebral artery dissection, potentially acute. 2. 60% RIGHT internal carotid artery origin stenosis, less than 50% LEFT internal carotid artery origin stenosis. 3. 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 CTA HEAD: 1. Occluded proximal RIGHT V4 segment. Retrograde flow/reconstitution distal RIGHT V4  segment. 2. 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 3. Patent cerebral artery's without severe stenosis. Moderate stenosis LEFT P1 segment. Critical Value/emergent results were called by telephone at the time of interpretation on 03/10/2017 at 8:05 pm to Dr. Samuel Jester , who verbally acknowledged these results. Electronically Signed   By: Awilda Metro M.D.   On: 03/10/2017 20:08   Ct Head Code Stroke Wo Contrast  Result Date: 03/10/2017 CLINICAL DATA:  Code stroke. Slurred speech and facial droop while shopping, last seen normal at 16 46 hours. History of hypertension, hyperlipidemia, diabetes. EXAM: CT HEAD WITHOUT CONTRAST TECHNIQUE: Contiguous axial images were obtained from the base of the skull through the vertex without intravenous contrast. COMPARISON:  CT HEAD July 29, 2014 FINDINGS: BRAIN: No intraparenchymal hemorrhage, mass effect nor midline shift. The ventricles and sulci are normal for age. Progressive patchy supratentorial white matter hypodensities. 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 Old small RIGHT cerebellar infarct. No acute large vascular territory infarcts. No abnormal extra-axial fluid collections. Punctate densities RIGHT parietal and RIGHT frontal sulci favoring dense veins or calcifications. Basal cisterns are patent. VASCULAR: Mild calcific atherosclerosis of the carotid siphons. Moderate calcific atherosclerosis LEFT vertebral artery. No dense artery's. SKULL: No skull fracture. No  significant scalp soft tissue swelling. SINUSES/ORBITS: The mastoid air-cells and included paranasal sinuses are well-aerated.The included ocular globes and orbital contents are non-suspicious. OTHER: None. ASPECTS [facility] Stroke Program Early CT Score) - Ganglionic level infarction (caudate, lentiform nuclei, internal capsule, insula, M1-M3 cortex): 7 - Supraganglionic infarction (M4-M6 cortex): 3 Total score (0-10 with 10 being normal): 10 IMPRESSION: 1. No acute intracranial process. 2. 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 3. Moderate chronic small vessel ischemic disease. 4. Critical Value/emergent results were called by telephone at the time of interpretation on 03/10/2017 at 6:10 pm to Dr. Samuel Jester , who verbally acknowledged these results. 5. ASPECTS is 10. Electronically Signed   By: Awilda Metro M.D.   On: 03/10/2017 18:14    My personal review of EKG: Rhythm NSR   Assessment & Plan:    Active Problems:   Vertebral artery dissection (HCC)   1. Vertebral artery dissection        IV heparin        Dr. Clarene Duke discussed with Dr. Imogene Burn        Patient coming discharged on Coumadin and follow-up in office  2. UTI        Start IV Cipro per pharmacy, patient is allergic to cephalosporins         Will obtain urine culture         Follow urine culture results  3    Diabetes mellitus        Continue Levemir         Initiate sliding scale insulin with NovoLog  4.     Hypertension      Continue metoprolol, Cozaar  5.      Allergic rhinitis        Continue Zyrtec.      Start Flonase 2 sprays in each nostril daily           Patient will be transferred to [facility] for closer monitoring Discussed with Dr. Toniann Fail, who has accepted the patient in transfer  DVT Prophylaxis-  Heparin  AM Labs Ordered, also please review Full Orders  Family Communication: Admission, patients condition and plan of care including tests being ordered have been  discussed with the patient  who indicate understanding and agree with the plan and Code Status.  Code Status:  Full code  Admission status: inpatient    Time spent in minutes : 60 minutes   Inge Waldroup S M.D on 03/10/2017 at 11:11 PM  Between 7am to 7pm - Pager - [phone]. After 7pm go to [email or web address] - password [facility]  [facility] - Office  [phone]

## 2017-03-10 NOTE — ED Provider Notes (Signed)
[facility] EMERGENCY DEPARTMENT Provider Note   CSN: [id number] Arrival date & time: 03/10/17  1740   An emergency department physician performed an initial assessment on this suspected stroke patient at 1750.  History   Chief Complaint Chief Complaint  Patient presents with  . Code Stroke    HPI MARVELL STAVOLA is a 66 y.o. female.  HPI  Pt was seen at 1745. Per pt and her family, c/o sudden onset and persistence of constant "stroke like symptoms" that began approximately 1710 PTA. Pt's family states they were in a car and stopped at a store. Family member went into the store approximately 1700 and pt was at her baseline. When he returned to the vehicle approximately 10 minutes later, pt had slurred speech and "a crooked mouth." Pt called her PMD and was told to come to the ED for further evaluation. Pt c/o "entire body pain" because "my fibromyalgia acting up." Pt and family states this has been for several days due to recent increase in activity "moving."  Denies CP/SOB, no abd pain, no N/V/D, no syncope, no focal motor weakness, no tingling/numbness in extremities.   Past Medical History:  Diagnosis Date  . CHF (congestive heart failure) (HCC)   . Depression   . Diabetes mellitus   . Fibromyalgia   . GERD (gastroesophageal reflux disease)   . Hyperlipidemia   . Hypertension   . Superficial thrombophlebitis    right leg    Patient Active Problem List   Diagnosis Date Noted  . Seasonal allergies 09/22/2015  . 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 Yeast infection 11/12/2014  . Skin infection 11/12/2014  . Carotid artery stenosis 08/09/2014  . Bladder prolapse, female, acquired 07/15/2014  . Urinary incontinence 07/15/2014  . Bradycardia 04/03/2014  . Bunion of great toe 05/11/2013  . Anxiety state, unspecified 09/17/2012  . Pain in joint, lower leg 09/17/2012  . Obstructive sleep apnea 07/21/2012  . SOB (shortness of breath) 12/06/2011  . Leg edema  12/06/2011  . CHF (congestive heart failure) (HCC) 12/06/2011  . GERD (gastroesophageal reflux disease)   . Vertigo 12/01/2011  . Fall 12/01/2011  . Depression 11/08/2011  . Insomnia 11/08/2011  . Obesity 11/08/2011  . Diabetes mellitus type II, uncontrolled (HCC) 03/03/2007  . Hyperlipidemia 03/03/2007  . Essential hypertension 03/03/2007  . Fibromyalgia 03/03/2007    Past Surgical History:  Procedure Laterality Date  . CHOLECYSTECTOMY  1991  . HERNIA REPAIR  approx 1969  . NECK SURGERY  1998  . TONSILLECTOMY AND ADENOIDECTOMY  1966    OB History    No data available       Home Medications    Prior to Admission medications   Medication Sig Start Date End Date Taking? Authorizing Provider  aspirin EC 81 MG tablet Take 81 mg by mouth at bedtime. Reported on 07/03/2015   Yes [provider]  cetirizine (ZYRTEC) 10 MG tablet Take 1 tablet (10 mg total) by mouth daily. 06/03/16  Yes [place], Velna Hatchet, MD  chlorhexidine (HIBICLENS) 4 % external liquid Apply topically daily as needed. 04/07/16  Yes [place], Velna Hatchet, MD  clotrimazole (LOTRIMIN) 1 % cream Apply 1 application topically 2 (two) times daily. 08/11/15  Yes [place], Velna Hatchet, MD  clotrimazole-betamethasone (LOTRISONE) cream Apply 1 application topically 2 (two) times daily. 12/15/16  Yes [place], Velna Hatchet, MD  fenofibrate 54 MG tablet Take 1 tablet (54 mg total) by mouth daily. 06/03/16  Yes [place], Velna Hatchet, MD  furosemide (LASIX) 40 MG tablet Take 1 tablet twice a day Patient taking differently: Take 40 mg by mouth 2 (two) times daily as needed for fluid. Take 1 tablet twice a day 07/07/16  Yes [place], Velna Hatchet, MD  gabapentin (NEURONTIN) 300 MG capsule Take 1 capsule (300 mg total) by mouth at bedtime. 12/15/16  Yes [place], Velna Hatchet, MD  HYDROcodone-acetaminophen (NORCO) 7.5-325 MG tablet Take 1 tablet by mouth every 6 (six) hours as needed for moderate pain. 12/15/16  Yes [place], Velna Hatchet, MD  insulin aspart  (NOVOLOG) 100 UNIT/ML injection Inject 40 units before breakfast & lunch and 60 units at supper 12/17/16  Yes Romero Belling, MD  insulin detemir (LEVEMIR) 100 UNIT/ML injection Inject 0.55 mLs (55 Units total) into the skin at bedtime. 04/12/16  Yes Romero Belling, MD  ipratropium (ATROVENT) 0.02 % nebulizer solution Take 2.5 mLs (0.5 mg total) by nebulization every 4 (four) hours as needed for wheezing or shortness of breath. 05/14/15  Yes Allayne Butcher B, PA-C  ipratropium (ATROVENT) 0.06 % nasal spray Place 2 sprays into both nostrils 4 (four) times daily as needed for rhinitis. 10/05/16  Yes [place], Velna Hatchet, MD  losartan (COZAAR) 100 MG tablet Take 1 tablet (100 mg total) by mouth daily. 06/03/16  Yes [place], Velna Hatchet, MD  metoprolol tartrate (LOPRESSOR) 25 MG tablet Take 0.5 tablets (12.5 mg total) by mouth 2 (two) times daily. 06/03/16  Yes [place], Velna Hatchet, MD  montelukast (SINGULAIR) 10 MG tablet Take 1 tablet (10 mg total) by mouth at bedtime. 06/03/16  Yes [place], Velna Hatchet, MD  nystatin (MYCOSTATIN/NYSTOP) powder Apply topically 4 (four) times daily. 12/15/16  Yes [place], Velna Hatchet, MD  Omega-3 Fatty Acids (FISH OIL) 1000 MG CAPS Take 2 capsules by mouth 2 (two) times daily.   Yes [provider]  omeprazole (PRILOSEC) 20 MG capsule TAKE 1 CAPSULE BY MOUTH EVERY DAY 06/03/16  Yes [place], Velna Hatchet, MD  PARoxetine (PAXIL) 40 MG tablet Take 1 tablet (40 mg total) by mouth every morning. 06/23/16  Yes [place], Velna Hatchet, MD  potassium chloride (K-DUR) 10 MEQ tablet Take 1 tablet (10 mEq total) by mouth 2 (two) times daily. Patient taking differently: Take 10 mEq by mouth 2 (two) times daily as needed (when taking lasix).  06/03/16  Yes [place], Velna Hatchet, MD  rosuvastatin (CRESTOR) 40 MG tablet Take 1 tablet (40 mg total) by mouth every evening. 06/03/16  Yes [place], Velna Hatchet, MD  TRUEPLUS INSULIN SYRINGE 31G X 5/16" 1 ML MISC USE AS DIRECTED TO INJECT 12/23/14  Yes , Velna Hatchet, MD  insulin  lispro (HUMALOG) 100 UNIT/ML injection 3 times a day (just before each meal) 40-40-60 units Patient not taking: Reported on 03/10/2017 09/09/16   Romero Belling, MD    Family History Family History  Problem Relation Age of Onset  . Hypertension Mother   . Hyperlipidemia Mother   . Heart disease Mother   . Stroke Sister   . Hypertension Sister   . Hyperlipidemia Sister   . Heart disease Sister   . Diabetes Maternal Grandmother   . Diabetes Daughter     Social History Social History  Substance Use Topics  . Smoking status: Former Smoker    Quit date: 05/07/2010  . Smokeless tobacco: Never Used  . Alcohol use No     Allergies   Cefuroxime axetil; Fentanyl and related; Niaspan [niacin er]; Sulfur; Vasotec [enalapril]; Welchol [colesevelam hcl]; Lyrica [pregabalin]; and Penicillins   Review of  Systems Review of Systems ROS: Statement: All systems negative except as marked or noted in the HPI; Constitutional: Negative for fever and chills. ; ; Eyes: Negative for eye pain, redness and discharge. ; ; ENMT: Negative for ear pain, hoarseness, nasal congestion, sinus pressure and sore throat. ; ; Cardiovascular: Negative for chest pain, palpitations, diaphoresis, dyspnea and peripheral edema. ; ; Respiratory: Negative for cough, wheezing and stridor. ; ; Gastrointestinal: Negative for nausea, vomiting, diarrhea, abdominal pain, blood in stool, hematemesis, jaundice and rectal bleeding. . ; ; Genitourinary: Negative for dysuria, flank pain and hematuria. ; ; Musculoskeletal: Negative for back pain and neck pain. Negative for swelling and trauma.; ; Skin: Negative for pruritus, rash, abrasions, blisters, bruising and skin lesion.; ; Neuro: +slurred speech, facial droop. Negative for headache, lightheadedness and neck stiffness. Negative for weakness, altered level of consciousness, altered mental status, extremity weakness, paresthesias, involuntary movement, seizure and syncope.        Physical Exam Updated Vital Signs BP 128/64   Pulse 73   Temp (!) 97.5 F (36.4 C) (Oral)   SpO2 94%   Physical Exam 1750: Physical examination:  Nursing notes reviewed; Vital signs and O2 SAT reviewed;  Constitutional: Well developed, Well nourished, Well hydrated, In no acute distress; Head:  Normocephalic, atraumatic; Eyes: EOMI, PERRL, No scleral icterus; ENMT: Mouth and pharynx normal, Mucous membranes moist; Neck: Supple, Full range of motion, No lymphadenopathy; Cardiovascular: Regular rate and rhythm, No gallop; Respiratory: Breath sounds clear & equal bilaterally, No wheezes.  Speaking full sentences with ease, Normal respiratory effort/excursion; Chest: Nontender, Movement normal; Abdomen: Soft, Nontender, Nondistended, Normal bowel sounds; Genitourinary: No CVA tenderness; Extremities: Pulses normal, No tenderness, No edema, No calf edema or asymmetry.; Neuro: AA&Ox3,  Major CN grossly intact. Speech slightly slurred.  No facial droop. Grips equal. Strength 5/5 equal bilat UE's and LE's.  DTR 2/4 equal bilat UE's and LE's.  No gross sensory deficits.  Normal cerebellar testing LE's (heel-shin), +ataxic bilat UE's (finger-nose).; Skin: Color normal, Warm, Dry.      ED Treatments / Results  Labs (all labs ordered are listed, but only abnormal results are displayed)   EKG  EKG Interpretation  Date/Time:  Thursday March 10 2017 18:17:42 EDT Ventricular Rate:  67 PR Interval:    QRS Duration: 95 QT Interval:  422 QTC Calculation: 446 R Axis:   75 Text Interpretation:  Sinus rhythm Low voltage, precordial leads Borderline T wave abnormalities When compared with ECG of 12/07/2011 QT has shortened Confirmed by Samuel Jester [phone]) on 03/10/2017 6:25:13 PM       Radiology   Procedures Procedures (including critical care time)  Medications Ordered in ED Medications - No data to display   Initial Impression / Assessment and Plan / ED Course  I have  reviewed the triage vital signs and the nursing notes.  Pertinent labs & imaging results that were available during my care of the patient were reviewed by me and considered in my medical decision making (see chart for details).  MDM Reviewed: previous chart, nursing note and vitals Reviewed previous: labs and ECG Interpretation: labs, ECG and CT scan Total time providing critical care: 30-74 minutes. This excludes time spent performing separately reportable procedures and services. Consults: neurology and admitting MD   CRITICAL CARE Performed by: Laray Anger Total critical care time: 35 minutes Critical care time was exclusive of separately billable procedures and treating other patients. Critical care was necessary to treat or prevent imminent or life-threatening deterioration. Critical  care was time spent personally by me on the following activities: development of treatment plan with patient and/or surrogate as well as nursing, discussions with consultants, evaluation of patient's response to treatment, examination of patient, obtaining history from patient or surrogate, ordering and performing treatments and interventions, ordering and review of laboratory studies, ordering and review of radiographic studies, pulse oximetry and re-evaluation of patient's condition.   Results for orders placed or performed during the hospital encounter of 03/10/17  Ethanol  Result Value Ref Range   Alcohol, Ethyl (B) <10 <10 mg/dL  Protime-INR  Result Value Ref Range   Prothrombin Time 12.7 11.4 - 15.2 seconds   INR 0.96   APTT  Result Value Ref Range   aPTT 27 24 - 36 seconds  CBC  Result Value Ref Range   WBC 9.5 4.0 - 10.5 K/uL   RBC 4.01 3.87 - 5.11 MIL/uL   Hemoglobin 13.4 12.0 - 15.0 g/dL   HCT 16.1 09.6 - 04.5 %   MCV 98.8 78.0 - 100.0 fL   MCH 33.4 26.0 - 34.0 pg   MCHC 33.8 30.0 - 36.0 g/dL   RDW 40.9 81.1 - 91.4 %   Platelets 217 150 - 400 K/uL  Differential  Result  Value Ref Range   Neutrophils Relative % 64 %   Neutro Abs 6.0 1.7 - 7.7 K/uL   Lymphocytes Relative 28 %   Lymphs Abs 2.7 0.7 - 4.0 K/uL   Monocytes Relative 7 %   Monocytes Absolute 0.7 0.1 - 1.0 K/uL   Eosinophils Relative 1 %   Eosinophils Absolute 0.1 0.0 - 0.7 K/uL   Basophils Relative 0 %   Basophils Absolute 0.0 0.0 - 0.1 K/uL  Comprehensive metabolic panel  Result Value Ref Range   Sodium 143 135 - 145 mmol/L   Potassium 3.8 3.5 - 5.1 mmol/L   Chloride 106 101 - 111 mmol/L   CO2 29 22 - 32 mmol/L   Glucose, Bld 189 (H) 65 - 99 mg/dL   BUN 23 (H) 6 - 20 mg/dL   Creatinine, Ser 7.82 (H) 0.44 - 1.00 mg/dL   Calcium 9.0 8.9 - 95.6 mg/dL   Total Protein 7.4 6.5 - 8.1 g/dL   Albumin 3.9 3.5 - 5.0 g/dL   AST 22 15 - 41 U/L   ALT 22 14 - 54 U/L   Alkaline Phosphatase 85 38 - 126 U/L   Total Bilirubin 0.4 0.3 - 1.2 mg/dL   GFR calc non Af Amer 49 (L) >60 mL/min   GFR calc Af Amer 57 (L) >60 mL/min   Anion gap 8 5 - 15  I-Stat Chem 8, ED  (not at [facility])  Result Value Ref Range   Sodium 142 135 - 145 mmol/L   Potassium 3.6 3.5 - 5.1 mmol/L   Chloride 103 101 - 111 mmol/L   BUN 24 (H) 6 - 20 mg/dL   Creatinine, Ser 2.13 (H) 0.44 - 1.00 mg/dL   Glucose, Bld 086 (H) 65 - 99 mg/dL   Calcium, Ion 5.78 4.69 - 1.40 mmol/L   TCO2 29 22 - 32 mmol/L   Hemoglobin 13.6 12.0 - 15.0 g/dL   HCT 62.9 52.8 - 41.3 %  I-stat troponin, ED (not at [facility], [facility])  Result Value Ref Range   Troponin i, poc 0.00 0.00 - 0.08 ng/mL   Comment 3          CBG monitoring, ED  Result Value Ref Range   Glucose-Capillary  170 (H) 65 - 99 mg/dL   Ct Head Code Stroke Wo Contrast Result Date: 03/10/2017 CLINICAL DATA:  Code stroke. Slurred speech and facial droop while shopping, last seen normal at 16 46 hours. History of hypertension, hyperlipidemia, diabetes. EXAM: CT HEAD WITHOUT CONTRAST TECHNIQUE: Contiguous axial images were obtained from the base of the skull through the vertex without intravenous  contrast. COMPARISON:  CT HEAD July 29, 2014 FINDINGS: BRAIN: No intraparenchymal hemorrhage, mass effect nor midline shift. The ventricles and sulci are normal for age. Progressive patchy supratentorial white matter hypodensities. 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 Old small RIGHT cerebellar infarct. No acute large vascular territory infarcts. No abnormal extra-axial fluid collections. Punctate densities RIGHT parietal and RIGHT frontal sulci favoring dense veins or calcifications. Basal cisterns are patent. VASCULAR: Mild calcific atherosclerosis of the carotid siphons. Moderate calcific atherosclerosis LEFT vertebral artery. No dense artery's. SKULL: No skull fracture. No significant scalp soft tissue swelling. SINUSES/ORBITS: The mastoid air-cells and included paranasal sinuses are well-aerated.The included ocular globes and orbital contents are non-suspicious. OTHER: None. ASPECTS [facility] Stroke Program Early CT Score) - Ganglionic level infarction (caudate, lentiform nuclei, internal capsule, insula, M1-M3 cortex): 7 - Supraganglionic infarction (M4-M6 cortex): 3 Total score (0-10 with 10 being normal): 10 IMPRESSION: 1. No acute intracranial process. 2. 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 3. Moderate chronic small vessel ischemic disease. 4. Critical Value/emergent results were called by telephone at the time of interpretation on 03/10/2017 at 6:10 pm to Dr. Samuel Jester , who verbally acknowledged these results. 5. ASPECTS is 10. Electronically Signed   By: Awilda Metro M.D.   On: 03/10/2017 18:14    Ct Angio Head W Or Wo Contrast Result Date: 03/10/2017 CLINICAL DATA:  Facial droop and slurred speech after shopping. Follow-up code stroke. EXAM: CT ANGIOGRAPHY HEAD AND NECK TECHNIQUE: Multidetector CT imaging of the head and neck was performed using the standard protocol during bolus administration of intravenous contrast. Multiplanar CT image  reconstructions and MIPs were obtained to evaluate the vascular anatomy. Carotid stenosis measurements (when applicable) are obtained utilizing NASCET criteria, using the distal internal carotid diameter as the denominator. CONTRAST:  80 cc Isovue 370 COMPARISON:  CT HEAD March 10, 2017 at 1753 hours and carotid ultrasound August 08, 2014 FINDINGS: CTA NECK AORTIC ARCH: Normal appearance of the thoracic arch, 2 vessel arch is a normal variant. The origins of the innominate, left Common carotid artery and subclavian artery are widely patent. RIGHT CAROTID SYSTEM: Common carotid artery is widely patent, coursing in a straight line fashion. Eccentric intimal thickening under lesser sac calcific atherosclerosis result in 60% stenosis RIGHT internal carotid artery origin. LEFT CAROTID SYSTEM: Common carotid artery is widely patent, coursing in a straight line fashion. Calcific atherosclerosis LEFT vertebral artery resulting in less than 50% stenosis LEFT internal carotid artery origin. VERTEBRAL ARTERIES:Left vertebral artery is dominant. Venous reflux obscuring RIGHT V1 segment. 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 Thready contrast opacification through the RIGHT V4 segment. SKELETON: No acute osseous process though bone windows have not been submitted. C4 through C7 ACDF, C6-7 pseudoarthrosis. C7 screws within C7-T1 disc space. Mild canal stenosis C4-5. OTHER NECK: Soft tissues of the neck are nonacute though, not tailored for evaluation. UPPER CHEST: Pulmonary vascular congestion and heterogeneous lung attenuation suggesting pulmonary edema. Subcentimeter short access mediastinal lymph nodes are likely reactive. CTA HEAD ANTERIOR CIRCULATION: Patent cervical internal carotid arteries, petrous, cavernous and supra clinoid internal carotid arteries.  Fenestrated patent anterior communicating artery. Patent anterior and middle cerebral arteries. No large vessel  occlusion, significant stenosis, contrast extravasation or aneurysm. POSTERIOR CIRCULATION: Occluded RIGHT V4 segment. Bilateral posterior inferior cerebral artery's are patent with reconstitution RIGHT distal V4 segment, possible retrograde flow. Calcific atherosclerosis resulting in mild LEFT V4 stenosis. Severe stenosis LEFT superior cerebellar artery origin. Small LEFT posterior communicating artery present. Patent posterior cerebral artery's. Moderate stenosis distal LEFT P1 segment. Mild stenosis RIGHT P2 segment. No  contrast extravasation or aneurysm. VENOUS SINUSES: Major dural venous sinuses are patent though not tailored for evaluation on this angiographic examination. ANATOMIC VARIANTS: None. DELAYED PHASE: No abnormal intracranial enhancement. MIP images reviewed. IMPRESSION: CTA NECK: 1. Flow limiting RIGHT vertebral artery dissection, potentially acute. 2. 60% RIGHT internal carotid artery origin stenosis, less than 50% LEFT internal carotid artery origin stenosis. 3. 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 CTA HEAD: 1. Occluded proximal RIGHT V4 segment. Retrograde flow/reconstitution distal RIGHT V4 segment. 2. 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 3. Patent cerebral artery's without severe stenosis. Moderate stenosis LEFT P1 segment. Critical Value/emergent results were called by telephone at the time of interpretation on 03/10/2017 at 8:05 pm to Dr. Samuel Jester , who verbally acknowledged these results. Electronically Signed   By: Awilda Metro M.D.   On: 03/10/2017 20:08   Ct Angio Neck W And/or Wo Contrast Result Date: 03/10/2017 CLINICAL DATA:  Facial droop and slurred speech after shopping. Follow-up code stroke. EXAM: CT ANGIOGRAPHY HEAD AND NECK TECHNIQUE: Multidetector CT imaging of the head and neck was performed using the standard protocol during bolus administration of intravenous contrast. Multiplanar CT image reconstructions  and MIPs were obtained to evaluate the vascular anatomy. Carotid stenosis measurements (when applicable) are obtained utilizing NASCET criteria, using the distal internal carotid diameter as the denominator. CONTRAST:  80 cc Isovue 370 COMPARISON:  CT HEAD March 10, 2017 at 1753 hours and carotid ultrasound August 08, 2014 FINDINGS: CTA NECK AORTIC ARCH: Normal appearance of the thoracic arch, 2 vessel arch is a normal variant. The origins of the innominate, left Common carotid artery and subclavian artery are widely patent. RIGHT CAROTID SYSTEM: Common carotid artery is widely patent, coursing in a straight line fashion. Eccentric intimal thickening under lesser sac calcific atherosclerosis result in 60% stenosis RIGHT internal carotid artery origin. LEFT CAROTID SYSTEM: Common carotid artery is widely patent, coursing in a straight line fashion. Calcific atherosclerosis LEFT vertebral artery resulting in less than 50% stenosis LEFT internal carotid artery origin. VERTEBRAL ARTERIES:Left vertebral artery is dominant. Venous reflux obscuring RIGHT V1 segment. 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 Thready contrast opacification through the RIGHT V4 segment. SKELETON: No acute osseous process though bone windows have not been submitted. C4 through C7 ACDF, C6-7 pseudoarthrosis. C7 screws within C7-T1 disc space. Mild canal stenosis C4-5. OTHER NECK: Soft tissues of the neck are nonacute though, not tailored for evaluation. UPPER CHEST: Pulmonary vascular congestion and heterogeneous lung attenuation suggesting pulmonary edema. Subcentimeter short access mediastinal lymph nodes are likely reactive. CTA HEAD ANTERIOR CIRCULATION: Patent cervical internal carotid arteries, petrous, cavernous and supra clinoid internal carotid arteries. Fenestrated patent anterior communicating artery. Patent anterior and middle cerebral arteries. No large vessel occlusion, significant  stenosis, contrast extravasation or aneurysm. POSTERIOR CIRCULATION: Occluded RIGHT V4 segment. Bilateral posterior inferior cerebral artery's are patent with reconstitution RIGHT distal V4 segment, possible retrograde flow. Calcific atherosclerosis resulting in mild LEFT V4 stenosis. Severe stenosis LEFT superior  cerebellar artery origin. Small LEFT posterior communicating artery present. Patent posterior cerebral artery's. Moderate stenosis distal LEFT P1 segment. Mild stenosis RIGHT P2 segment. No  contrast extravasation or aneurysm. VENOUS SINUSES: Major dural venous sinuses are patent though not tailored for evaluation on this angiographic examination. ANATOMIC VARIANTS: None. DELAYED PHASE: No abnormal intracranial enhancement. MIP images reviewed. IMPRESSION: CTA NECK: 1. Flow limiting RIGHT vertebral artery dissection, potentially acute. 2. 60% RIGHT internal carotid artery origin stenosis, less than 50% LEFT internal carotid artery origin stenosis. 3. 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 CTA HEAD: 1. Occluded proximal RIGHT V4 segment. Retrograde flow/reconstitution distal RIGHT V4 segment. 2. 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 3. Patent cerebral artery's without severe stenosis. Moderate stenosis LEFT P1 segment. Critical Value/emergent results were called by telephone at the time of interpretation on 03/10/2017 at 8:05 pm to Dr. Samuel JesterKATHLEEN Kenia Teagarden , who verbally acknowledged these results. Electronically Signed   By: Awilda Metroourtnay  Bloomer M.D.   On: 03/10/2017 20:08     1815:  Code Stroke called on pt[facility] arrival[facility] not [facility] candidate due to rapidly improving symptoms (NIH 0 for his exam), likely TIA, requests to obtain CT-A head and neck, admit for further workup.   2005:  T/C from [facility] MD: CT-A with vascular dissection. 2010[facility]  [facility] Imogene Burnhen, case discussed, including:  HPI, pertinent PM/SHx, VS/PE, dx  testing, ED course and treatment:  No acute surgical intervention needed at this time, start IV heparin gtt and transition to coumadin/etc, f/u office.  2035:  T/C to [facility] Dr. Sharl MaLama, case discussed, including:  HPI, pertinent PM/SHx, VS/PE, dx testing, ED course and treatment:  Agreeable to admit and [facility].      Final Clinical Impressions(s) / ED Diagnoses   Final diagnoses:  None    New Prescriptions New Prescriptions   No medications on file     Samuel JesterMcManus, Neiko Trivedi, DO 03/12/17 1732

## 2017-03-10 NOTE — Progress Notes (Signed)
[facility]. We will [facility] admit [facility]

## 2017-03-10 NOTE — Telephone Encounter (Signed)
noted 

## 2017-03-10 NOTE — Progress Notes (Signed)
ANTICOAGULATION CONSULT NOTE - Initial Consult  Pharmacy Consult for HEPARIN Indication: stroke  Allergies  Allergen Reactions  . Cefuroxime Axetil Other (See Comments)    REACTION: unspecified  . Fentanyl And Related Other (See Comments)    Durgesic Patch: unknown reaction  . Niaspan [Niacin Er] Other (See Comments)    Burning all over  . Sulfur Nausea And Vomiting  . Vasotec [Enalapril] Cough  . Welchol [Colesevelam Hcl] Other (See Comments)    Muscle aches and joint pains  . Lyrica [Pregabalin] Rash and Other (See Comments)    Blistering rash around same time as starting drug  . Penicillins Rash    Patient Measurements: Height: 4\' 11"  (149.9 cm) Weight: 200 lb (90.7 kg) IBW/kg (Calculated) : 43.2 HEPARIN DW (KG): 65  Vital Signs: Temp: 97.5 F (36.4 C) (10/18 1747) Temp Source: Oral (10/18 1747) BP: 127/74 (10/18 1914) Pulse Rate: 72 (10/18 1914)  Labs:  Recent Labs  03/10/17 1740 03/10/17 1801  HGB 13.4 13.6  HCT 39.6 40.0  PLT 217  --   APTT 27  --   LABPROT 12.7  --   INR 0.96  --   CREATININE 1.15* 1.10*   Estimated Creatinine Clearance: 50.1 mL/min (A) (by C-G formula based on SCr of 1.1 mg/dL (H)).  Medical History: Past Medical History:  Diagnosis Date  . CHF (congestive heart failure) (HCC)   . Depression   . Diabetes mellitus   . Fibromyalgia   . GERD (gastroesophageal reflux disease)   . Hyperlipidemia   . Hypertension   . Superficial thrombophlebitis    right leg   Medications:   (Not in a hospital admission)  Assessment: 66yo female with slurred speech and facial droop.  Asked to initiate Heparin for stroke.    Goal of Therapy:  Heparin level 0.3-0.5 Monitor platelets by anticoagulation protocol: Yes   Plan:  Heparin infusion at 900 units/hr No bolus Monitor heparin level and CBC  Caylyn Tedeschi A 03/10/2017,8:28 PM

## 2017-03-10 NOTE — ED Notes (Signed)
[facility] test [facility]. Reported neurologist would be on camera in a few minutes. Pt in CT at this time.   Labs obtained by Phlebotomist at time of RN calling code stroke. I-stat labs being completed by ED NT at this time.

## 2017-03-11 ENCOUNTER — Ambulatory Visit: Payer: Medicare Other | Admitting: Family Medicine

## 2017-03-11 ENCOUNTER — Encounter (HOSPITAL_COMMUNITY): Payer: Self-pay | Admitting: *Deleted

## 2017-03-11 DIAGNOSIS — I7774 Dissection of vertebral artery: Principal | ICD-10-CM

## 2017-03-11 DIAGNOSIS — M797 Fibromyalgia: Secondary | ICD-10-CM

## 2017-03-11 DIAGNOSIS — N390 Urinary tract infection, site not specified: Secondary | ICD-10-CM

## 2017-03-11 DIAGNOSIS — G459 Transient cerebral ischemic attack, unspecified: Secondary | ICD-10-CM

## 2017-03-11 LAB — COMPREHENSIVE METABOLIC PANEL
ALBUMIN: 3.5 g/dL (ref 3.5–5.0)
ALK PHOS: 70 U/L (ref 38–126)
ALT: 19 U/L (ref 14–54)
ANION GAP: 9 (ref 5–15)
AST: 18 U/L (ref 15–41)
BUN: 18 mg/dL (ref 6–20)
CALCIUM: 8.3 mg/dL — AB (ref 8.9–10.3)
CHLORIDE: 104 mmol/L (ref 101–111)
CO2: 25 mmol/L (ref 22–32)
CREATININE: 0.77 mg/dL (ref 0.44–1.00)
GFR calc Af Amer: >60 mL/min (ref >60)
GFR calc non Af Amer: >60 mL/min (ref >60)
GLUCOSE: 174 mg/dL — AB (ref 65–99)
Potassium: 3.5 mmol/L (ref 3.5–5.1)
SODIUM: 138 mmol/L (ref 135–145)
Total Bilirubin: 0.6 mg/dL (ref 0.3–1.2)
Total Protein: 6.5 g/dL (ref 6.5–8.1)

## 2017-03-11 LAB — CBC
HCT: 36.4 % (ref 36.0–46.0)
HEMOGLOBIN: 12.1 g/dL (ref 12.0–15.0)
MCH: 32.4 pg (ref 26.0–34.0)
MCHC: 33.2 g/dL (ref 30.0–36.0)
MCV: 97.6 fL (ref 78.0–100.0)
Platelets: 161 10*3/uL (ref 150–400)
RBC: 3.73 MIL/uL — AB (ref 3.87–5.11)
RDW: 13.2 % (ref 11.5–15.5)
WBC: 6.5 10*3/uL (ref 4.0–10.5)

## 2017-03-11 LAB — GLUCOSE, CAPILLARY
GLUCOSE-CAPILLARY: 255 mg/dL — AB (ref 65–99)
Glucose-Capillary: 265 mg/dL — ABNORMAL HIGH (ref 65–99)

## 2017-03-11 LAB — HEPARIN LEVEL (UNFRACTIONATED)
Heparin Unfractionated: 0.25 IU/mL — ABNORMAL LOW (ref 0.30–0.70)
Heparin Unfractionated: 0.25 IU/mL — ABNORMAL LOW (ref 0.30–0.70)

## 2017-03-11 MED ORDER — WARFARIN SODIUM 5 MG PO TABS
5.0000 mg | ORAL_TABLET | Freq: Once | ORAL | Status: AC
  Administered 2017-03-11: 5 mg via ORAL
  Filled 2017-03-11: qty 1

## 2017-03-11 MED ORDER — WARFARIN - PHARMACIST DOSING INPATIENT
Freq: Every day | Status: DC
  Administered 2017-03-11: 18:00:00

## 2017-03-11 MED ORDER — IPRATROPIUM-ALBUTEROL 0.5-2.5 (3) MG/3ML IN SOLN: 3.0000 mL | Freq: Four times a day (QID) | RESPIRATORY_TRACT | Status: DC | PRN

## 2017-03-11 MED ORDER — CIPROFLOXACIN IN D5W 400 MG/200ML IV SOLN
400.0000 mg | Freq: Two times a day (BID) | INTRAVENOUS | Status: DC
  Administered 2017-03-11 – 2017-03-12 (×3): 400 mg via INTRAVENOUS
  Filled 2017-03-11 (×3): qty 200

## 2017-03-11 MED ORDER — INSULIN ASPART 100 UNIT/ML ~~LOC~~ SOLN
0.0000 [IU] | Freq: Three times a day (TID) | SUBCUTANEOUS | Status: DC
  Administered 2017-03-12: 2 [IU] via SUBCUTANEOUS
  Administered 2017-03-12: 5 [IU] via SUBCUTANEOUS

## 2017-03-11 MED ORDER — IPRATROPIUM-ALBUTEROL 0.5-2.5 (3) MG/3ML IN SOLN
3.0000 mL | Freq: Two times a day (BID) | RESPIRATORY_TRACT | Status: DC
  Administered 2017-03-11 – 2017-03-12 (×2): 3 mL via RESPIRATORY_TRACT
  Filled 2017-03-11 (×2): qty 3

## 2017-03-11 MED ORDER — INSULIN ASPART 100 UNIT/ML ~~LOC~~ SOLN
0.0000 [IU] | Freq: Every day | SUBCUTANEOUS | Status: DC
  Administered 2017-03-11: 3 [IU] via SUBCUTANEOUS

## 2017-03-11 NOTE — ED Notes (Signed)
AC aware of meds needed.  

## 2017-03-11 NOTE — Progress Notes (Signed)
ANTICOAGULATION CONSULT NOTE   Pharmacy Consult for HEPARIN Indication: stroke  Allergies  Allergen Reactions  . Cefuroxime Axetil Other (See Comments)    REACTION: unspecified  . Fentanyl And Related Other (See Comments)    Durgesic Patch: unknown reaction  . Niaspan [Niacin Er] Other (See Comments)    Burning all over  . Sulfur Nausea And Vomiting  . Vasotec [Enalapril] Cough  . Welchol [Colesevelam Hcl] Other (See Comments)    Muscle aches and joint pains  . Lyrica [Pregabalin] Rash and Other (See Comments)    Blistering rash around same time as starting drug  . Penicillins Rash   Patient Measurements: Height: 4\' 11"  (149.9 cm) Weight: 208 lb 12.8 oz (94.7 kg) IBW/kg (Calculated) : 43.2 HEPARIN DW (KG): 66.2  Vital Signs: Temp: 97.4 F (36.3 C) (10/19 0625) Temp Source: Oral (10/19 0625) BP: 146/83 (10/19 0625) Pulse Rate: 68 (10/19 0625)  Labs:  Recent Labs  03/10/17 1740 03/10/17 1801 03/11/17 0801  HGB 13.4 13.6 12.1  HCT 39.6 40.0 36.4  PLT 217  --  161  APTT 27  --   --   LABPROT 12.7  --   --   INR 0.96  --   --   HEPARINUNFRC  --   --  0.25*  CREATININE 1.15* 1.10* 0.77   Estimated Creatinine Clearance: 70.6 mL/min (by C-G formula based on SCr of 0.77 mg/dL).  Medical History: Past Medical History:  Diagnosis Date  . CHF (congestive heart failure) (HCC)   . Depression   . Diabetes mellitus   . Fibromyalgia   . GERD (gastroesophageal reflux disease)   . Hyperlipidemia   . Hypertension   . Superficial thrombophlebitis    right leg   Medications:  Prescriptions Prior to Admission  Medication Sig Dispense Refill Last Dose  . aspirin EC 81 MG tablet Take 81 mg by mouth at bedtime. Reported on 07/03/2015   03/09/2017 at Unknown time  . cetirizine (ZYRTEC) 10 MG tablet Take 1 tablet (10 mg total) by mouth daily. 90 tablet 1 03/10/2017 at Unknown time  . chlorhexidine (HIBICLENS) 4 % external liquid Apply topically daily as needed. 120 mL 0 Taking   . clotrimazole (LOTRIMIN) 1 % cream Apply 1 application topically 2 (two) times daily. 30 g 0 03/10/2017 at Unknown time  . clotrimazole-betamethasone (LOTRISONE) cream Apply 1 application topically 2 (two) times daily. 60 g 1 03/10/2017 at Unknown time  . fenofibrate 54 MG tablet Take 1 tablet (54 mg total) by mouth daily. 90 tablet 3 03/10/2017 at Unknown time  . furosemide (LASIX) 40 MG tablet Take 1 tablet twice a day (Patient taking differently: Take 40 mg by mouth 2 (two) times daily as needed for fluid. Take 1 tablet twice a day) 180 tablet 3 unknown  . gabapentin (NEURONTIN) 300 MG capsule Take 1 capsule (300 mg total) by mouth at bedtime. 30 capsule 3 03/09/2017 at Unknown time  . HYDROcodone-acetaminophen (NORCO) 7.5-325 MG tablet Take 1 tablet by mouth every 6 (six) hours as needed for moderate pain. 45 tablet 0 unknown  . insulin aspart (NOVOLOG) 100 UNIT/ML injection Inject 40 units before breakfast & lunch and 60 units at supper 120 mL 3 03/10/2017 at Unknown time  . insulin detemir (LEVEMIR) 100 UNIT/ML injection Inject 0.55 mLs (55 Units total) into the skin at bedtime. 60 mL 3 03/09/2017 at Unknown time  . ipratropium (ATROVENT) 0.02 % nebulizer solution Take 2.5 mLs (0.5 mg total) by nebulization every 4 (four) hours  as needed for wheezing or shortness of breath. 75 mL 3 Past Week at Unknown time  . ipratropium (ATROVENT) 0.06 % nasal spray Place 2 sprays into both nostrils 4 (four) times daily as needed for rhinitis. 15 mL 3 unknown  . losartan (COZAAR) 100 MG tablet Take 1 tablet (100 mg total) by mouth daily. 90 tablet 3 03/10/2017 at Unknown time  . metoprolol tartrate (LOPRESSOR) 25 MG tablet Take 0.5 tablets (12.5 mg total) by mouth 2 (two) times daily. 90 tablet 3 03/10/2017 at 0930  . montelukast (SINGULAIR) 10 MG tablet Take 1 tablet (10 mg total) by mouth at bedtime. 30 tablet 6 03/09/2017 at Unknown time  . nystatin (MYCOSTATIN/NYSTOP) powder Apply topically 4 (four) times  daily. 180 g 2 03/10/2017 at Unknown time  . Omega-3 Fatty Acids (FISH OIL) 1000 MG CAPS Take 2 capsules by mouth 2 (two) times daily.   Past Month at Unknown time  . omeprazole (PRILOSEC) 20 MG capsule TAKE 1 CAPSULE BY MOUTH EVERY DAY 90 capsule 3 03/10/2017 at Unknown time  . PARoxetine (PAXIL) 40 MG tablet Take 1 tablet (40 mg total) by mouth every morning. 90 tablet 3 03/10/2017 at Unknown time  . potassium chloride (K-DUR) 10 MEQ tablet Take 1 tablet (10 mEq total) by mouth 2 (two) times daily. (Patient taking differently: Take 10 mEq by mouth 2 (two) times daily as needed (when taking lasix). ) 180 tablet 3 unknown  . rosuvastatin (CRESTOR) 40 MG tablet Take 1 tablet (40 mg total) by mouth every evening. 90 tablet 3 03/09/2017 at Unknown time  . TRUEPLUS INSULIN SYRINGE 31G X 5/16" 1 ML MISC USE AS DIRECTED TO INJECT 100 each 2 Taking  . insulin lispro (HUMALOG) 100 UNIT/ML injection 3 times a day (just before each meal) 40-40-60 units (Patient not taking: Reported on 03/10/2017) 13 vial 3 Not Taking at Unknown time   Assessment: 66yo female with slurred speech and facial droop.  Asked to initiate Heparin for stroke.  Heparin level is below goal.   Goal of Therapy:  Heparin level 0.3-0.5 Monitor platelets by anticoagulation protocol: Yes   Plan:  Increase Heparin infusion to 1100 units/hr No bolus Recheck heparin level in 6-8 hrs Monitor heparin level and CBC  Aniko Finnigan A 03/11/2017,9:20 AM

## 2017-03-11 NOTE — Care Management Important Message (Signed)
Important Message  Patient Details  Name: Tina [name] MRN: [id number] Date of Birth: July 11, 1950   Medicare Important Message Given:  Yes    Malcolm MetroChildress, Osamah Schmader Demske, RN 03/11/2017, 3:46 PM

## 2017-03-11 NOTE — Progress Notes (Signed)
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 Cefuroxime Axetil Other (See Comments)    REACTION: unspecified  . Fentanyl And Related Other (See Comments)    Durgesic Patch: unknown reaction  . Niaspan [Niacin Er] Other (See Comments)    Burning all over  . Sulfur Nausea And Vomiting  . Vasotec [Enalapril] Cough  . Welchol [Colesevelam Hcl] Other (See Comments)    Muscle aches and joint pains  . Lyrica [Pregabalin] Rash and Other (See Comments)    Blistering rash around same time as starting drug  . Penicillins Rash    Patient Measurements: Height: 4\' 11"  (149.9 cm) Weight: 208 lb 12.8 oz (94.7 kg) IBW/kg (Calculated) : 43.2 Heparin Dosing Weight: 66 kg  Vital Signs: Temp: 98.1 F (36.7 C) (10/19 1355) Temp Source: Oral (10/19 1355) BP: 158/78 (10/19 1355) Pulse Rate: 65 (10/19 1355)  Labs:  Recent Labs  03/10/17 1740 03/10/17 1801 03/11/17 0801 03/11/17 1523  HGB 13.4 13.6 12.1  --   HCT 39.6 40.0 36.4  --   PLT 217  --  161  --   APTT 27  --   --   --   LABPROT 12.7  --   --   --   INR 0.96  --   --   --   HEPARINUNFRC  --   --  0.25* 0.25*  CREATININE 1.15* 1.10* 0.77  --     Estimated Creatinine Clearance: 70.6 mL/min (by C-G formula based on SCr of 0.77 mg/dL).   Medical History: Past Medical History:  Diagnosis Date  . CHF (congestive heart failure) (HCC)   . Depression   . Diabetes mellitus   . Fibromyalgia   . GERD (gastroesophageal reflux disease)   . Hyperlipidemia   . Hypertension   . Superficial thrombophlebitis    right leg    Medications:  Scheduled:  . aspirin EC  81 mg Oral QHS  . fenofibrate  54 mg Oral Daily  . fluticasone  2 spray Each Nare Daily  . gabapentin  300 mg Oral QHS  . insulin detemir  55 Units Subcutaneous QHS  . ipratropium-albuterol  3 mL Nebulization BID  . loratadine  10 mg Oral Daily  . losartan  100  mg Oral Daily  . metoprolol tartrate  12.5 mg Oral BID  . montelukast  10 mg Oral QHS  . omega-3 acid ethyl esters  2 g Oral BID  . pantoprazole  40 mg Oral Daily  . PARoxetine  40 mg Oral q morning - 10a  . rosuvastatin  40 mg Oral QPM  . warfarin  5 mg Oral ONCE-1800  . Warfarin - Pharmacist Dosing Inpatient   Does not apply q1800    Assessment: IV heparin to transition to coumadin  Goal of Therapy:  INR 2-3 Monitor platelets by anticoagulation protocol: Yes   Plan:  Coumadin 5 mg po x 1 dose tonight INR/PT daily   Raquel JamesPittman, Florene Brill Bennett 03/11/2017,5:06 PM

## 2017-03-11 NOTE — Progress Notes (Signed)
PROGRESS NOTE    [name]  ZOX:[id number] DOB: 06-Jan-1951 DOA: 03/10/2017 PCP: Salley Scarlet, MD    Brief Narrative:  Tina [name]  is a 66 y.o. female, with history of hypertension, diabetes mellitus, fibromyalgia, GERD, hyperlipidemia, hypertension, CHF who was brought to hospital by family due to sudden onset of strokelike symptoms. Patient says that she was in a car stopped at a store, she did not want to go on the story she did not feel well. Administrator and family arrived this patient has slurred speech and was talking on phone with her PCP. PCP told patient to go to ED for further evaluation. Possible stroke.  Patient says that she also had blurred vision. Denies focal weakness of extremities. No numbness or tingling. She denies chest pain or shortness of breath. Complains of stuffy nose. Does have allergies. No nausea vomiting or diarrhea. She denies dysuria, urgency or frequency of urination.  In the ED, code stroke was called. [facility] Neurology evaluated the patient, and found that she is not [place] today due to rapidly improving symptoms. NIH 0 as per neurologist. CTA head and neck was recommended.  CT showed vascular dissection, Dr. Clarene Duke called and discussed with vascular surgery Dr. Imogene Burn, who recommended patient to start on IV heparin and transition to Coumadin before discharge and follow-up in his office.  Patient has been started on IV heparin.   Assessment & Plan:   Active Problems:   Vertebral artery dissection (HCC)  Vertebral artery dissection        IV heparin to transition to coumadin        Patient coming discharged on Coumadin and follow-up in office        Pharmacy to dose coumadin  UTI        Start IV Cipro per pharmacy, patient is allergic to cephalosporins        Nitrite positive         Follow urine culture results  Diabetes mellitus        Continue Levemir         Initiate sliding scale insulin with NovoLog  Hypertension  Continue metoprolol, Cozaar  Allergic rhinitis        Continue Zyrtec.      Start Flonase 2 sprays in each nostril daily   DVT prophylaxis: heparin Code Status: Full code Family Communication: no family bedside Disposition Plan: likely discharge in 1-2 days   Consultants:   Neurology  Procedures:   None  Antimicrobials:   None    Subjective: Patient states she is very concerned about the cost of her hospital stay.  She reports that she is trying to figure out her insurance now that she turned 66.  Reports improvement in her symptoms. Wants to leave today.  Has reservations about coumadin.  Objective: Vitals:   03/11/17 0425 03/11/17 0625 03/11/17 0733 03/11/17 1355  BP: (!) 157/80 (!) 146/83  (!) 158/78  Pulse: 74 68  65  Resp: 16 18  18   Temp:  (!) 97.4 F (36.3 C)  98.1 F (36.7 C)  TempSrc:  Oral  Oral  SpO2: 97% 98% 97% 96%  Weight:      Height:        Intake/Output Summary (Last 24 hours) at 03/11/17 1651 Last data filed at 03/11/17 1000  Gross per 24 hour  Intake            873.9 ml  Output  0 ml  Net            873.9 ml   Filed Weights   03/10/17 2019 03/11/17 0229  Weight: 90.7 kg (200 lb) 94.7 kg (208 lb 12.8 oz)    Examination:  General exam: Appears calm and comfortable  Respiratory system: Clear to auscultation. Respiratory effort normal. Cardiovascular system: S1 & S2 heard, RRR. No JVD, murmurs, rubs, gallops or clicks. No pedal edema. Gastrointestinal system: Abdomen is nondistended, soft and nontender. No organomegaly or masses felt. Normal bowel sounds heard. Central nervous system: Alert and oriented. No focal neurological deficits. CN II-XII grossly intact Extremities: Symmetric 5 x 5 power. Skin: No rashes, lesions or ulcers Psychiatry: Judgement and insight appear normal. Mood & affect appropriate.     Data Reviewed: I have personally reviewed following labs and imaging studies  CBC:  Recent Labs Lab  03/10/17 1740 03/10/17 1801 03/11/17 0801  WBC 9.5  --  6.5  NEUTROABS 6.0  --   --   HGB 13.4 13.6 12.1  HCT 39.6 40.0 36.4  MCV 98.8  --  97.6  PLT 217  --  161   Basic Metabolic Panel:  Recent Labs Lab 03/10/17 1740 03/10/17 1801 03/11/17 0801  NA 143 142 138  K 3.8 3.6 3.5  CL 106 103 104  CO2 29  --  25  GLUCOSE 189* 177* 174*  BUN 23* 24* 18  CREATININE 1.15* 1.10* 0.77  CALCIUM 9.0  --  8.3*   GFR: Estimated Creatinine Clearance: 70.6 mL/min (by C-G formula based on SCr of 0.77 mg/dL). Liver Function Tests:  Recent Labs Lab 03/10/17 1740 03/11/17 0801  AST 22 18  ALT 22 19  ALKPHOS 85 70  BILITOT 0.4 0.6  PROT 7.4 6.5  ALBUMIN 3.9 3.5   No results for input(s): LIPASE, AMYLASE in the last 168 hours. No results for input(s): AMMONIA in the last 168 hours. Coagulation Profile:  Recent Labs Lab 03/10/17 1740  INR 0.96   Cardiac Enzymes: No results for input(s): CKTOTAL, CKMB, CKMBINDEX, TROPONINI in the last 168 hours. BNP (last 3 results) No results for input(s): PROBNP in the last 8760 hours. HbA1C: No results for input(s): HGBA1C in the last 72 hours. CBG:  Recent Labs Lab 03/10/17 1750  GLUCAP 170*   Lipid Profile: No results for input(s): CHOL, HDL, LDLCALC, TRIG, CHOLHDL, LDLDIRECT in the last 72 hours. Thyroid Function Tests: No results for input(s): TSH, T4TOTAL, FREET4, T3FREE, THYROIDAB in the last 72 hours. Anemia Panel: No results for input(s): VITAMINB12, FOLATE, FERRITIN, TIBC, IRON, RETICCTPCT in the last 72 hours. Sepsis Labs: No results for input(s): PROCALCITON, LATICACIDVEN in the last 168 hours.  No results found for this or any previous visit (from the past 240 hour(s)).       Radiology Studies: Ct Angio Head W Or Wo Contrast  Result Date: 03/10/2017 CLINICAL DATA:  Facial droop and slurred speech after shopping. Follow-up code stroke. EXAM: CT ANGIOGRAPHY HEAD AND NECK TECHNIQUE: Multidetector CT imaging of  the head and neck was performed using the standard protocol during bolus administration of intravenous contrast. Multiplanar CT image reconstructions and MIPs were obtained to evaluate the vascular anatomy. Carotid stenosis measurements (when applicable) are obtained utilizing NASCET criteria, using the distal internal carotid diameter as the denominator. CONTRAST:  80 cc Isovue 370 COMPARISON:  CT HEAD March 10, 2017 at 1753 hours and carotid ultrasound August 08, 2014 FINDINGS: CTA NECK AORTIC ARCH: Normal appearance of the thoracic arch,  2 vessel arch is a normal variant. The origins of the innominate, left Common carotid artery and subclavian artery are widely patent. RIGHT CAROTID SYSTEM: Common carotid artery is widely patent, coursing in a straight line fashion. Eccentric intimal thickening under lesser sac calcific atherosclerosis result in 60% stenosis RIGHT internal carotid artery origin. LEFT CAROTID SYSTEM: Common carotid artery is widely patent, coursing in a straight line fashion. Calcific atherosclerosis LEFT vertebral artery resulting in less than 50% stenosis LEFT internal carotid artery origin. VERTEBRAL ARTERIES:Left vertebral artery is dominant. Venous reflux obscuring RIGHT V1 segment. 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 Thready contrast opacification through the RIGHT V4 segment. SKELETON: No acute osseous process though bone windows have not been submitted. C4 through C7 ACDF, C6-7 pseudoarthrosis. C7 screws within C7-T1 disc space. Mild canal stenosis C4-5. OTHER NECK: Soft tissues of the neck are nonacute though, not tailored for evaluation. UPPER CHEST: Pulmonary vascular congestion and heterogeneous lung attenuation suggesting pulmonary edema. Subcentimeter short access mediastinal lymph nodes are likely reactive. CTA HEAD ANTERIOR CIRCULATION: Patent cervical internal carotid arteries, petrous, cavernous and supra clinoid internal  carotid arteries. Fenestrated patent anterior communicating artery. Patent anterior and middle cerebral arteries. No large vessel occlusion, significant stenosis, contrast extravasation or aneurysm. POSTERIOR CIRCULATION: Occluded RIGHT V4 segment. Bilateral posterior inferior cerebral artery's are patent with reconstitution RIGHT distal V4 segment, possible retrograde flow. Calcific atherosclerosis resulting in mild LEFT V4 stenosis. Severe stenosis LEFT superior cerebellar artery origin. Small LEFT posterior communicating artery present. Patent posterior cerebral artery's. Moderate stenosis distal LEFT P1 segment. Mild stenosis RIGHT P2 segment. No  contrast extravasation or aneurysm. VENOUS SINUSES: Major dural venous sinuses are patent though not tailored for evaluation on this angiographic examination. ANATOMIC VARIANTS: None. DELAYED PHASE: No abnormal intracranial enhancement. MIP images reviewed. IMPRESSION: CTA NECK: 1. Flow limiting RIGHT vertebral artery dissection, potentially acute. 2. 60% RIGHT internal carotid artery origin stenosis, less than 50% LEFT internal carotid artery origin stenosis. 3. 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 CTA HEAD: 1. Occluded proximal RIGHT V4 segment. Retrograde flow/reconstitution distal RIGHT V4 segment. 2. 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 3. Patent cerebral artery's without severe stenosis. Moderate stenosis LEFT P1 segment. Critical Value/emergent results were called by telephone at the time of interpretation on 03/10/2017 at 8:05 pm to Dr. Samuel Jester , who verbally acknowledged these results. Electronically Signed   By: Awilda Metro M.D.   On: 03/10/2017 20:08   Ct Angio Neck W And/or Wo Contrast  Result Date: 03/10/2017 CLINICAL DATA:  Facial droop and slurred speech after shopping. Follow-up code stroke. EXAM: CT ANGIOGRAPHY HEAD AND NECK TECHNIQUE: Multidetector CT imaging of the head and neck  was performed using the standard protocol during bolus administration of intravenous contrast. Multiplanar CT image reconstructions and MIPs were obtained to evaluate the vascular anatomy. Carotid stenosis measurements (when applicable) are obtained utilizing NASCET criteria, using the distal internal carotid diameter as the denominator. CONTRAST:  80 cc Isovue 370 COMPARISON:  CT HEAD March 10, 2017 at 1753 hours and carotid ultrasound August 08, 2014 FINDINGS: CTA NECK AORTIC ARCH: Normal appearance of the thoracic arch, 2 vessel arch is a normal variant. The origins of the innominate, left Common carotid artery and subclavian artery are widely patent. RIGHT CAROTID SYSTEM: Common carotid artery is widely patent, coursing in a straight line fashion. Eccentric intimal thickening under lesser sac calcific atherosclerosis result in 60% stenosis RIGHT internal carotid artery origin. LEFT  CAROTID SYSTEM: Common carotid artery is widely patent, coursing in a straight line fashion. Calcific atherosclerosis LEFT vertebral artery resulting in less than 50% stenosis LEFT internal carotid artery origin. VERTEBRAL ARTERIES:Left vertebral artery is dominant. Venous reflux obscuring RIGHT V1 segment. 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 Thready contrast opacification through the RIGHT V4 segment. SKELETON: No acute osseous process though bone windows have not been submitted. C4 through C7 ACDF, C6-7 pseudoarthrosis. C7 screws within C7-T1 disc space. Mild canal stenosis C4-5. OTHER NECK: Soft tissues of the neck are nonacute though, not tailored for evaluation. UPPER CHEST: Pulmonary vascular congestion and heterogeneous lung attenuation suggesting pulmonary edema. Subcentimeter short access mediastinal lymph nodes are likely reactive. CTA HEAD ANTERIOR CIRCULATION: Patent cervical internal carotid arteries, petrous, cavernous and supra clinoid internal carotid arteries.  Fenestrated patent anterior communicating artery. Patent anterior and middle cerebral arteries. No large vessel occlusion, significant stenosis, contrast extravasation or aneurysm. POSTERIOR CIRCULATION: Occluded RIGHT V4 segment. Bilateral posterior inferior cerebral artery's are patent with reconstitution RIGHT distal V4 segment, possible retrograde flow. Calcific atherosclerosis resulting in mild LEFT V4 stenosis. Severe stenosis LEFT superior cerebellar artery origin. Small LEFT posterior communicating artery present. Patent posterior cerebral artery's. Moderate stenosis distal LEFT P1 segment. Mild stenosis RIGHT P2 segment. No  contrast extravasation or aneurysm. VENOUS SINUSES: Major dural venous sinuses are patent though not tailored for evaluation on this angiographic examination. ANATOMIC VARIANTS: None. DELAYED PHASE: No abnormal intracranial enhancement. MIP images reviewed. IMPRESSION: CTA NECK: 1. Flow limiting RIGHT vertebral artery dissection, potentially acute. 2. 60% RIGHT internal carotid artery origin stenosis, less than 50% LEFT internal carotid artery origin stenosis. 3. 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 CTA HEAD: 1. Occluded proximal RIGHT V4 segment. Retrograde flow/reconstitution distal RIGHT V4 segment. 2. 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 3. Patent cerebral artery's without severe stenosis. Moderate stenosis LEFT P1 segment. Critical Value/emergent results were called by telephone at the time of interpretation on 03/10/2017 at 8:05 pm to Dr. Samuel Jester , who verbally acknowledged these results. Electronically Signed   By: Awilda Metro M.D.   On: 03/10/2017 20:08   Ct Head Code Stroke Wo Contrast  Result Date: 03/10/2017 CLINICAL DATA:  Code stroke. Slurred speech and facial droop while shopping, last seen normal at 16 46 hours. History of hypertension, hyperlipidemia, diabetes. EXAM: CT HEAD WITHOUT CONTRAST TECHNIQUE:  Contiguous axial images were obtained from the base of the skull through the vertex without intravenous contrast. COMPARISON:  CT HEAD July 29, 2014 FINDINGS: BRAIN: No intraparenchymal hemorrhage, mass effect nor midline shift. The ventricles and sulci are normal for age. Progressive patchy supratentorial white matter hypodensities. 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 Old small RIGHT cerebellar infarct. No acute large vascular territory infarcts. No abnormal extra-axial fluid collections. Punctate densities RIGHT parietal and RIGHT frontal sulci favoring dense veins or calcifications. Basal cisterns are patent. VASCULAR: Mild calcific atherosclerosis of the carotid siphons. Moderate calcific atherosclerosis LEFT vertebral artery. No dense artery's. SKULL: No skull fracture. No significant scalp soft tissue swelling. SINUSES/ORBITS: The mastoid air-cells and included paranasal sinuses are well-aerated.The included ocular globes and orbital contents are non-suspicious. OTHER: None. ASPECTS [facility] Stroke Program Early CT Score) - Ganglionic level infarction (caudate, lentiform nuclei, internal capsule, insula, M1-M3 cortex): 7 - Supraganglionic infarction (M4-M6 cortex): 3 Total score (0-10 with 10 being normal): 10 IMPRESSION: 1. No acute intracranial process. 2. 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 3.  Moderate chronic small vessel ischemic disease. 4. Critical Value/emergent results were called by telephone at the time of interpretation on 03/10/2017 at 6:10 pm to Dr. Samuel Jester , who verbally acknowledged these results. 5. ASPECTS is 10. Electronically Signed   By: Awilda Metro M.D.   On: 03/10/2017 18:14        Scheduled Meds: . aspirin EC  81 mg Oral QHS  . fenofibrate  54 mg Oral Daily  . fluticasone  2 spray Each Nare Daily  . gabapentin  300 mg Oral QHS  . insulin detemir  55 Units Subcutaneous QHS  . ipratropium-albuterol  3 mL  Nebulization BID  . loratadine  10 mg Oral Daily  . losartan  100 mg Oral Daily  . metoprolol tartrate  12.5 mg Oral BID  . montelukast  10 mg Oral QHS  . omega-3 acid ethyl esters  2 g Oral BID  . pantoprazole  40 mg Oral Daily  . PARoxetine  40 mg Oral q morning - 10a  . rosuvastatin  40 mg Oral QPM   Continuous Infusions: . sodium chloride Stopped (03/11/17 0224)  . ciprofloxacin Stopped (03/11/17 1118)  . heparin 1,100 Units/hr (03/11/17 1017)     LOS: 1 day    Time spent: 30 minutes    Katrinka Blazing, MD [facility] Pager [phone]  If 7PM-7AM, please contact night-coverage [email or web address] Password [email or web address] 03/11/2017, 4:51 PM

## 2017-03-11 NOTE — ACP (Advance Care Planning) (Signed)
Brought Ms Tina [name] Advance Directives information. She was sleeping and I left information with contact number on her bedside table.

## 2017-03-11 NOTE — Progress Notes (Signed)
ANTICOAGULATION CONSULT NOTE   Pharmacy Consult for HEPARIN Indication: stroke  Allergies  Allergen Reactions  . Cefuroxime Axetil Other (See Comments)    REACTION: unspecified  . Fentanyl And Related Other (See Comments)    Durgesic Patch: unknown reaction  . Niaspan [Niacin Er] Other (See Comments)    Burning all over  . Sulfur Nausea And Vomiting  . Vasotec [Enalapril] Cough  . Welchol [Colesevelam Hcl] Other (See Comments)    Muscle aches and joint pains  . Lyrica [Pregabalin] Rash and Other (See Comments)    Blistering rash around same time as starting drug  . Penicillins Rash   Patient Measurements: Height: 4\' 11"  (149.9 cm) Weight: 208 lb 12.8 oz (94.7 kg) IBW/kg (Calculated) : 43.2 HEPARIN DW (KG): 66.2  Vital Signs: Temp: 98.1 F (36.7 C) (10/19 1355) Temp Source: Oral (10/19 1355) BP: 158/78 (10/19 1355) Pulse Rate: 65 (10/19 1355)  Labs:  Recent Labs  03/10/17 1740 03/10/17 1801 03/11/17 0801 03/11/17 1523  HGB 13.4 13.6 12.1  --   HCT 39.6 40.0 36.4  --   PLT 217  --  161  --   APTT 27  --   --   --   LABPROT 12.7  --   --   --   INR 0.96  --   --   --   HEPARINUNFRC  --   --  0.25* 0.25*  CREATININE 1.15* 1.10* 0.77  --    Estimated Creatinine Clearance: 70.6 mL/min (by C-G formula based on SCr of 0.77 mg/dL).  Medical History: Past Medical History:  Diagnosis Date  . CHF (congestive heart failure) (HCC)   . Depression   . Diabetes mellitus   . Fibromyalgia   . GERD (gastroesophageal reflux disease)   . Hyperlipidemia   . Hypertension   . Superficial thrombophlebitis    right leg   Medications:  Prescriptions Prior to Admission  Medication Sig Dispense Refill Last Dose  . aspirin EC 81 MG tablet Take 81 mg by mouth at bedtime. Reported on 07/03/2015   03/09/2017 at Unknown time  . cetirizine (ZYRTEC) 10 MG tablet Take 1 tablet (10 mg total) by mouth daily. 90 tablet 1 03/10/2017 at Unknown time  . chlorhexidine (HIBICLENS) 4 % external  liquid Apply topically daily as needed. 120 mL 0 Taking  . clotrimazole (LOTRIMIN) 1 % cream Apply 1 application topically 2 (two) times daily. 30 g 0 03/10/2017 at Unknown time  . clotrimazole-betamethasone (LOTRISONE) cream Apply 1 application topically 2 (two) times daily. 60 g 1 03/10/2017 at Unknown time  . fenofibrate 54 MG tablet Take 1 tablet (54 mg total) by mouth daily. 90 tablet 3 03/10/2017 at Unknown time  . furosemide (LASIX) 40 MG tablet Take 1 tablet twice a day (Patient taking differently: Take 40 mg by mouth 2 (two) times daily as needed for fluid. Take 1 tablet twice a day) 180 tablet 3 unknown  . gabapentin (NEURONTIN) 300 MG capsule Take 1 capsule (300 mg total) by mouth at bedtime. 30 capsule 3 03/09/2017 at Unknown time  . HYDROcodone-acetaminophen (NORCO) 7.5-325 MG tablet Take 1 tablet by mouth every 6 (six) hours as needed for moderate pain. 45 tablet 0 unknown  . insulin aspart (NOVOLOG) 100 UNIT/ML injection Inject 40 units before breakfast & lunch and 60 units at supper 120 mL 3 03/10/2017 at Unknown time  . insulin detemir (LEVEMIR) 100 UNIT/ML injection Inject 0.55 mLs (55 Units total) into the skin at bedtime. 60 mL 3  03/09/2017 at Unknown time  . ipratropium (ATROVENT) 0.02 % nebulizer solution Take 2.5 mLs (0.5 mg total) by nebulization every 4 (four) hours as needed for wheezing or shortness of breath. 75 mL 3 Past Week at Unknown time  . ipratropium (ATROVENT) 0.06 % nasal spray Place 2 sprays into both nostrils 4 (four) times daily as needed for rhinitis. 15 mL 3 unknown  . losartan (COZAAR) 100 MG tablet Take 1 tablet (100 mg total) by mouth daily. 90 tablet 3 03/10/2017 at Unknown time  . metoprolol tartrate (LOPRESSOR) 25 MG tablet Take 0.5 tablets (12.5 mg total) by mouth 2 (two) times daily. 90 tablet 3 03/10/2017 at 0930  . montelukast (SINGULAIR) 10 MG tablet Take 1 tablet (10 mg total) by mouth at bedtime. 30 tablet 6 03/09/2017 at Unknown time  . nystatin  (MYCOSTATIN/NYSTOP) powder Apply topically 4 (four) times daily. 180 g 2 03/10/2017 at Unknown time  . Omega-3 Fatty Acids (FISH OIL) 1000 MG CAPS Take 2 capsules by mouth 2 (two) times daily.   Past Month at Unknown time  . omeprazole (PRILOSEC) 20 MG capsule TAKE 1 CAPSULE BY MOUTH EVERY DAY 90 capsule 3 03/10/2017 at Unknown time  . PARoxetine (PAXIL) 40 MG tablet Take 1 tablet (40 mg total) by mouth every morning. 90 tablet 3 03/10/2017 at Unknown time  . potassium chloride (K-DUR) 10 MEQ tablet Take 1 tablet (10 mEq total) by mouth 2 (two) times daily. (Patient taking differently: Take 10 mEq by mouth 2 (two) times daily as needed (when taking lasix). ) 180 tablet 3 unknown  . rosuvastatin (CRESTOR) 40 MG tablet Take 1 tablet (40 mg total) by mouth every evening. 90 tablet 3 03/09/2017 at Unknown time  . TRUEPLUS INSULIN SYRINGE 31G X 5/16" 1 ML MISC USE AS DIRECTED TO INJECT 100 each 2 Taking  . insulin lispro (HUMALOG) 100 UNIT/ML injection 3 times a day (just before each meal) 40-40-60 units (Patient not taking: Reported on 03/10/2017) 13 vial 3 Not Taking at Unknown time   Assessment: 66yo female with slurred speech and facial droop.  Asked to initiate Heparin for stroke.  Heparin level is below goal.   Goal of Therapy:  Heparin level 0.3-0.5 Monitor platelets by anticoagulation protocol: Yes   Plan:  Increase Heparin infusion to 1200 units/hr No bolus Recheck heparin level in 6-8 hrs Monitor heparin level and CBC  Brighten Buzzelli Bennett 03/11/2017,4:51 PM

## 2017-03-12 DIAGNOSIS — E1165 Type 2 diabetes mellitus with hyperglycemia: Secondary | ICD-10-CM

## 2017-03-12 LAB — CBC
HCT: 36.6 % (ref 36.0–46.0)
HEMOGLOBIN: 12.6 g/dL (ref 12.0–15.0)
MCH: 33.2 pg (ref 26.0–34.0)
MCHC: 34.4 g/dL (ref 30.0–36.0)
MCV: 96.3 fL (ref 78.0–100.0)
PLATELETS: 152 10*3/uL (ref 150–400)
RBC: 3.8 MIL/uL — AB (ref 3.87–5.11)
RDW: 13 % (ref 11.5–15.5)
WBC: 5.6 10*3/uL (ref 4.0–10.5)

## 2017-03-12 LAB — HIV ANTIBODY (ROUTINE TESTING W REFLEX): HIV Screen 4th Generation wRfx: NONREACTIVE

## 2017-03-12 LAB — HEMOGLOBIN A1C
HEMOGLOBIN A1C: 9.1 % — AB (ref 4.8–5.6)
MEAN PLASMA GLUCOSE: 214.47 mg/dL

## 2017-03-12 LAB — LIPID PANEL
CHOLESTEROL: 203 mg/dL — AB (ref 0–200)
HDL: 46 mg/dL (ref >40)
LDL Cholesterol: 85 mg/dL (ref 0–99)
TRIGLYCERIDES: 362 mg/dL — AB (ref <150)
Total CHOL/HDL Ratio: 4.4 RATIO
VLDL: 72 mg/dL — ABNORMAL HIGH (ref 0–40)

## 2017-03-12 LAB — GLUCOSE, CAPILLARY
Glucose-Capillary: 175 mg/dL — ABNORMAL HIGH (ref 65–99)
Glucose-Capillary: 272 mg/dL — ABNORMAL HIGH (ref 65–99)

## 2017-03-12 LAB — PROTIME-INR
INR: 1
PROTHROMBIN TIME: 13.1 s (ref 11.4–15.2)

## 2017-03-12 LAB — HEPARIN LEVEL (UNFRACTIONATED): HEPARIN UNFRACTIONATED: 0.67 [IU]/mL (ref 0.30–0.70)

## 2017-03-12 MED ORDER — WARFARIN - PHARMACIST DOSING INPATIENT: Status: DC

## 2017-03-12 MED ORDER — WARFARIN SODIUM 5 MG PO TABS: 5.0000 mg | ORAL_TABLET | Freq: Once | ORAL | Status: DC

## 2017-03-12 MED ORDER — CLOPIDOGREL BISULFATE 75 MG PO TABS
75.0000 mg | ORAL_TABLET | Freq: Every day | ORAL | Status: DC
  Administered 2017-03-12: 75 mg via ORAL
  Filled 2017-03-12: qty 1

## 2017-03-12 MED ORDER — METOPROLOL TARTRATE 25 MG PO TABS: ORAL_TABLET | ORAL | 0 refills | Status: DC

## 2017-03-12 MED ORDER — CIPROFLOXACIN HCL 500 MG PO TABS: 500.0000 mg | ORAL_TABLET | Freq: Two times a day (BID) | ORAL | 0 refills | Status: AC

## 2017-03-12 MED ORDER — ASPIRIN 325 MG PO TBEC: 325.0000 mg | DELAYED_RELEASE_TABLET | Freq: Every day | ORAL | 0 refills | Status: DC

## 2017-03-12 MED ORDER — CIPROFLOXACIN HCL 250 MG PO TABS
500.0000 mg | ORAL_TABLET | Freq: Two times a day (BID) | ORAL | Status: DC
  Administered 2017-03-12: 500 mg via ORAL
  Filled 2017-03-12: qty 2

## 2017-03-12 MED ORDER — CIPROFLOXACIN HCL 250 MG PO TABS: 500.0000 mg | ORAL_TABLET | Freq: Two times a day (BID) | ORAL | Status: DC

## 2017-03-12 MED ORDER — CLOPIDOGREL BISULFATE 75 MG PO TABS: 75.0000 mg | ORAL_TABLET | Freq: Every day | ORAL | 0 refills | Status: DC

## 2017-03-12 MED ORDER — ASPIRIN EC 325 MG PO TBEC: 325.0000 mg | DELAYED_RELEASE_TABLET | Freq: Every day | ORAL | Status: DC

## 2017-03-12 NOTE — Discharge Summary (Signed)
Physician Discharge Summary  [name]:[id number] DOB: August 21, 1950 DOA: 03/10/2017  PCP: Salley Scarlet, MD  Admit date: 03/10/2017 Discharge date: 03/12/2017  Admitted From: Home Disposition:  Home   Recommendations for Outpatient Follow-up:  1. Follow up with PCP in 1-2 weeks 2. Referral to outpatient neurology 3. Take aspirin and plavix as prescribed 4. Please obtain BMP/CBC in one week 5. Finish antibiotic as prescribed   Home Health: No Equipment/Devices: None  Discharge Condition: Stable  CODE STATUS: Full code Diet recommendation: Heart Healthy / Carb Modified  Brief/Interim Summary: JudyGriffinis a 66 y.o.female,with history of hypertension, diabetes mellitus, fibromyalgia, GERD, hyperlipidemia, hypertension, CHF who was brought to hospital by family due to sudden onset of strokelike symptoms. Patient says that she was in a car stopped at a store, she did not want to go on the story she did not feel well. Administrator and family arrived this patient has slurred speech and was talking on phone with her PCP. PCP told patient to go to ED for further evaluation. Possible stroke.  Patient says that she also had blurred vision. Denies focal weakness of extremities. No numbness or tingling. She denies chest pain or shortness of breath. Complains of stuffy nose. Does have allergies. No nausea vomiting or diarrhea. She denies dysuria, urgency or frequency of urination.  In the ED, code stroke was called. [facility] evaluated the patient, and found that she is not tPA appropriate due to rapidly improving symptoms. NIH 0 as per neurologist. CTA head and neck was recommended.  CT showed vascular dissection, Dr. Clarene Duke called and discussed with vascular surgery Dr. Imogene Burn, who recommended patient to start on IV heparin and transition to Coumadin before discharge and follow-up in his office.  Patient has been started on IV heparin and was to [facility] to [facility] but  patient refused.  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  Neurology phoned on 10/20 (consult placed on 10/19 however neurology did not round at [facility]) and agreed with aspirin and plavix and encouraged repeat scan of head and neck (CTA) within 1-2 months.  Encouraged blood pressure control as well as control of lipids. Patient was placed on aspirin and plavix prior to discharge.  She was found to also have an urinary tract infection growing GNR.  She was on ciprofloxacin which was changed from IV to PO at discharge.  Also, her heart rate was found to be bradycardic on telemetry and as such she was told to decrease her beta blocker and to follow up with her PCP.  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[facility])    Discharge Instructions  Discharge Instructions    Ambulatory referral to Neurology    Complete by:  As directed    An appointment is requested in approximately: 4 weeks   Call MD for:  difficulty breathing, headache or visual disturbances    Complete by:  As directed    Call MD for:  extreme fatigue    Complete by:  As directed    Call MD for:  hives    Complete by:  As directed    Call MD for:  persistant dizziness or light-headedness    Complete by:  As directed    Call MD for:  persistant nausea and vomiting    Complete by:  As directed    Call MD for:  redness, tenderness,  or signs of infection (pain, swelling, redness, odor or green/yellow discharge around incision site)    Complete by:  As directed    Call MD for:  severe uncontrolled pain    Complete by:  As directed    Call MD for:  temperature >100.4    Complete by:  As directed    Diet - low sodium heart healthy    Complete by:  As directed    Diet Carb Modified    Complete by:  As directed    Discharge instructions    Complete by:  As directed    Take  medications as prescribed Follow up with your PCP within 10 days Return to hospital with return of symptoms Referral to neurology Will need repeat CTA within 1.5 months   Increase activity slowly    Complete by:  As directed      Allergies as of 03/12/2017      Reactions   Cefuroxime Axetil Other (See Comments)   REACTION: unspecified   Fentanyl And Related Other (See Comments)   Durgesic Patch: unknown reaction   Niaspan [niacin Er] Other (See Comments)   Burning all over   Sulfur Nausea And Vomiting   Vasotec [enalapril] Cough   Welchol [colesevelam Hcl] Other (See Comments)   Muscle aches and joint pains   Lyrica [pregabalin] Rash, Other (See Comments)   Blistering rash around same time as starting drug   Penicillins Rash      Medication List    STOP taking these medications   HYDROcodone-acetaminophen 7.5-325 MG tablet Commonly known as:  NORCO   insulin lispro 100 UNIT/ML injection Commonly known as:  HUMALOG     TAKE these medications   aspirin 325 MG EC tablet Take 1 tablet (325 mg total) by mouth at bedtime. What changed:  medication strength  how much to take  additional instructions   cetirizine 10 MG tablet Commonly known as:  ZYRTEC Take 1 tablet (10 mg total) by mouth daily.   chlorhexidine 4 % external liquid Commonly known as:  HIBICLENS Apply topically daily as needed.   ciprofloxacin 500 MG tablet Commonly known as:  CIPRO Take 1 tablet (500 mg total) by mouth 2 (two) times daily.   clopidogrel 75 MG tablet Commonly known as:  PLAVIX Take 1 tablet (75 mg total) by mouth daily.   clotrimazole 1 % cream Commonly known as:  LOTRIMIN Apply 1 application topically 2 (two) times daily.   clotrimazole-betamethasone cream Commonly known as:  LOTRISONE Apply 1 application topically 2 (two) times daily.   fenofibrate 54 MG tablet Take 1 tablet (54 mg total) by mouth daily.   Fish Oil 1000 MG Caps Take 2 capsules by mouth 2 (two) times  daily.   furosemide 40 MG tablet Commonly known as:  LASIX Take 1 tablet twice a day What changed:  how much to take  how to take this  when to take this  reasons to take this  additional instructions   gabapentin 300 MG capsule Commonly known as:  NEURONTIN Take 1 capsule (300 mg total) by mouth at bedtime.   insulin aspart 100 UNIT/ML injection Commonly known as:  NOVOLOG Inject 40 units before breakfast & lunch and 60 units at supper   insulin detemir 100 UNIT/ML injection Commonly known as:  LEVEMIR Inject 0.55 mLs (55 Units total) into the skin at bedtime.   ipratropium 0.02 % nebulizer solution Commonly known as:  ATROVENT Take 2.5 mLs (0.5 mg total) by nebulization  every 4 (four) hours as needed for wheezing or shortness of breath.   ipratropium 0.06 % nasal spray Commonly known as:  ATROVENT Place 2 sprays into both nostrils 4 (four) times daily as needed for rhinitis.   losartan 100 MG tablet Commonly known as:  COZAAR Take 1 tablet (100 mg total) by mouth daily.   metoprolol tartrate 25 MG tablet Commonly known as:  LOPRESSOR 0.25 tablets PO twice a day What changed:  how much to take  how to take this  when to take this  additional instructions   montelukast 10 MG tablet Commonly known as:  SINGULAIR Take 1 tablet (10 mg total) by mouth at bedtime.   nystatin powder Commonly known as:  MYCOSTATIN/NYSTOP Apply topically 4 (four) times daily.   omeprazole 20 MG capsule Commonly known as:  PRILOSEC TAKE 1 CAPSULE BY MOUTH EVERY DAY   PARoxetine 40 MG tablet Commonly known as:  PAXIL Take 1 tablet (40 mg total) by mouth every morning.   potassium chloride 10 MEQ tablet Commonly known as:  K-DUR Take 1 tablet (10 mEq total) by mouth 2 (two) times daily. What changed:  when to take this  reasons to take this   rosuvastatin 40 MG tablet Commonly known as:  CRESTOR Take 1 tablet (40 mg total) by mouth every evening.   TRUEPLUS  INSULIN SYRINGE 31G X 5/16" 1 ML Misc Generic drug:  Insulin Syringe-Needle U-100 USE AS DIRECTED TO INJECT      Follow-up Information    ClaypoolDurham, Velna HatchetKawanta F, MD. Schedule an appointment as soon as possible for a visit in 1 week(s).   Specialty:  Family Medicine Contact information: [address] [place] [address] [phone]        [facility]. Schedule an appointment as soon as possible for a visit in 6 week(s).   Contact information: [address] [place] [id number] [phone]       Fransisco Hertzhen, Brian L, MD. Schedule an appointment as soon as possible for a visit in 2 week(s).   Specialties:  Vascular Surgery, Cardiology Contact information: [address] [place] [address] [phone]          Allergies  Allergen Reactions  . Cefuroxime Axetil Other (See Comments)    REACTION: unspecified  . Fentanyl And Related Other (See Comments)    Durgesic Patch: unknown reaction  . Niaspan [Niacin Er] Other (See Comments)    Burning all over  . Sulfur Nausea And Vomiting  . Vasotec [Enalapril] Cough  . Welchol [Colesevelam Hcl] Other (See Comments)    Muscle aches and joint pains  . Lyrica [Pregabalin] Rash and Other (See Comments)    Blistering rash around same time as starting drug  . Penicillins Rash    Consultations:  Neurology (phone)  Vascular surgery (phone)   Procedures/Studies: Ct Angio Head W Or Wo Contrast  Result Date: 03/10/2017 CLINICAL DATA:  Facial droop and slurred speech after shopping. Follow-up code stroke. EXAM: CT ANGIOGRAPHY HEAD AND NECK TECHNIQUE: Multidetector CT imaging of the head and neck was performed using the standard protocol during bolus administration of intravenous contrast. Multiplanar CT image reconstructions and MIPs were obtained to evaluate the vascular anatomy. Carotid stenosis measurements (when applicable) are obtained utilizing NASCET criteria, using the distal internal  carotid diameter as the denominator. CONTRAST:  80 cc Isovue 370 COMPARISON:  CT HEAD March 10, 2017 at 1753 hours and carotid ultrasound August 08, 2014 FINDINGS: CTA NECK  AORTIC ARCH: Normal appearance of the thoracic arch, 2 vessel arch is a normal variant. The origins of the innominate, left Common carotid artery and subclavian artery are widely patent. RIGHT CAROTID SYSTEM: Common carotid artery is widely patent, coursing in a straight line fashion. Eccentric intimal thickening under lesser sac calcific atherosclerosis result in 60% stenosis RIGHT internal carotid artery origin. LEFT CAROTID SYSTEM: Common carotid artery is widely patent, coursing in a straight line fashion. Calcific atherosclerosis LEFT vertebral artery resulting in less than 50% stenosis LEFT internal carotid artery origin. VERTEBRAL ARTERIES:Left vertebral artery is dominant. Venous reflux obscuring RIGHT V1 segment. 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 Thready contrast opacification through the RIGHT V4 segment. SKELETON: No acute osseous process though bone windows have not been submitted. C4 through C7 ACDF, C6-7 pseudoarthrosis. C7 screws within C7-T1 disc space. Mild canal stenosis C4-5. OTHER NECK: Soft tissues of the neck are nonacute though, not tailored for evaluation. UPPER CHEST: Pulmonary vascular congestion and heterogeneous lung attenuation suggesting pulmonary edema. Subcentimeter short access mediastinal lymph nodes are likely reactive. CTA HEAD ANTERIOR CIRCULATION: Patent cervical internal carotid arteries, petrous, cavernous and supra clinoid internal carotid arteries. Fenestrated patent anterior communicating artery. Patent anterior and middle cerebral arteries. No large vessel occlusion, significant stenosis, contrast extravasation or aneurysm. POSTERIOR CIRCULATION: Occluded RIGHT V4 segment. Bilateral posterior inferior cerebral artery's are patent with  reconstitution RIGHT distal V4 segment, possible retrograde flow. Calcific atherosclerosis resulting in mild LEFT V4 stenosis. Severe stenosis LEFT superior cerebellar artery origin. Small LEFT posterior communicating artery present. Patent posterior cerebral artery's. Moderate stenosis distal LEFT P1 segment. Mild stenosis RIGHT P2 segment. No  contrast extravasation or aneurysm. VENOUS SINUSES: Major dural venous sinuses are patent though not tailored for evaluation on this angiographic examination. ANATOMIC VARIANTS: None. DELAYED PHASE: No abnormal intracranial enhancement. MIP images reviewed. IMPRESSION: CTA NECK: 1. Flow limiting RIGHT vertebral artery dissection, potentially acute. 2. 60% RIGHT internal carotid artery origin stenosis, less than 50% LEFT internal carotid artery origin stenosis. 3. 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 CTA HEAD: 1. Occluded proximal RIGHT V4 segment. Retrograde flow/reconstitution distal RIGHT V4 segment. 2. 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 3. Patent cerebral artery's without severe stenosis. Moderate stenosis LEFT P1 segment. Critical Value/emergent results were called by telephone at the time of interpretation on 03/10/2017 at 8:05 pm to Dr. Samuel Jester , who verbally acknowledged these results. Electronically Signed   By: Awilda Metro M.D.   On: 03/10/2017 20:08   Ct Angio Neck W And/or Wo Contrast  Result Date: 03/10/2017 CLINICAL DATA:  Facial droop and slurred speech after shopping. Follow-up code stroke. EXAM: CT ANGIOGRAPHY HEAD AND NECK TECHNIQUE: Multidetector CT imaging of the head and neck was performed using the standard protocol during bolus administration of intravenous contrast. Multiplanar CT image reconstructions and MIPs were obtained to evaluate the vascular anatomy. Carotid stenosis measurements (when applicable) are obtained utilizing NASCET criteria, using the distal internal carotid  diameter as the denominator. CONTRAST:  80 cc Isovue 370 COMPARISON:  CT HEAD March 10, 2017 at 1753 hours and carotid ultrasound August 08, 2014 FINDINGS: CTA NECK AORTIC ARCH: Normal appearance of the thoracic arch, 2 vessel arch is a normal variant. The origins of the innominate, left Common carotid artery and subclavian artery are widely patent. RIGHT CAROTID SYSTEM: Common carotid artery is widely patent, coursing in a straight line fashion. Eccentric intimal thickening under lesser sac calcific atherosclerosis result in  60% stenosis RIGHT internal carotid artery origin. LEFT CAROTID SYSTEM: Common carotid artery is widely patent, coursing in a straight line fashion. Calcific atherosclerosis LEFT vertebral artery resulting in less than 50% stenosis LEFT internal carotid artery origin. VERTEBRAL ARTERIES:Left vertebral artery is dominant. Venous reflux obscuring RIGHT V1 segment. 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 Thready contrast opacification through the RIGHT V4 segment. SKELETON: No acute osseous process though bone windows have not been submitted. C4 through C7 ACDF, C6-7 pseudoarthrosis. C7 screws within C7-T1 disc space. Mild canal stenosis C4-5. OTHER NECK: Soft tissues of the neck are nonacute though, not tailored for evaluation. UPPER CHEST: Pulmonary vascular congestion and heterogeneous lung attenuation suggesting pulmonary edema. Subcentimeter short access mediastinal lymph nodes are likely reactive. CTA HEAD ANTERIOR CIRCULATION: Patent cervical internal carotid arteries, petrous, cavernous and supra clinoid internal carotid arteries. Fenestrated patent anterior communicating artery. Patent anterior and middle cerebral arteries. No large vessel occlusion, significant stenosis, contrast extravasation or aneurysm. POSTERIOR CIRCULATION: Occluded RIGHT V4 segment. Bilateral posterior inferior cerebral artery's are patent with reconstitution RIGHT  distal V4 segment, possible retrograde flow. Calcific atherosclerosis resulting in mild LEFT V4 stenosis. Severe stenosis LEFT superior cerebellar artery origin. Small LEFT posterior communicating artery present. Patent posterior cerebral artery's. Moderate stenosis distal LEFT P1 segment. Mild stenosis RIGHT P2 segment. No  contrast extravasation or aneurysm. VENOUS SINUSES: Major dural venous sinuses are patent though not tailored for evaluation on this angiographic examination. ANATOMIC VARIANTS: None. DELAYED PHASE: No abnormal intracranial enhancement. MIP images reviewed. IMPRESSION: CTA NECK: 1. Flow limiting RIGHT vertebral artery dissection, potentially acute. 2. 60% RIGHT internal carotid artery origin stenosis, less than 50% LEFT internal carotid artery origin stenosis. 3. 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 CTA HEAD: 1. Occluded proximal RIGHT V4 segment. Retrograde flow/reconstitution distal RIGHT V4 segment. 2. 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 3. Patent cerebral artery's without severe stenosis. Moderate stenosis LEFT P1 segment. Critical Value/emergent results were called by telephone at the time of interpretation on 03/10/2017 at 8:05 pm to Dr. Samuel Jester , who verbally acknowledged these results. Electronically Signed   By: Awilda Metro M.D.   On: 03/10/2017 20:08   Ct Head Code Stroke Wo Contrast  Result Date: 03/10/2017 CLINICAL DATA:  Code stroke. Slurred speech and facial droop while shopping, last seen normal at 16 46 hours. History of hypertension, hyperlipidemia, diabetes. EXAM: CT HEAD WITHOUT CONTRAST TECHNIQUE: Contiguous axial images were obtained from the base of the skull through the vertex without intravenous contrast. COMPARISON:  CT HEAD July 29, 2014 FINDINGS: BRAIN: No intraparenchymal hemorrhage, mass effect nor midline shift. The ventricles and sulci are normal for age. Progressive patchy supratentorial white  matter hypodensities. 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 Old small RIGHT cerebellar infarct. No acute large vascular territory infarcts. No abnormal extra-axial fluid collections. Punctate densities RIGHT parietal and RIGHT frontal sulci favoring dense veins or calcifications. Basal cisterns are patent. VASCULAR: Mild calcific atherosclerosis of the carotid siphons. Moderate calcific atherosclerosis LEFT vertebral artery. No dense artery's. SKULL: No skull fracture. No significant scalp soft tissue swelling. SINUSES/ORBITS: The mastoid air-cells and included paranasal sinuses are well-aerated.The included ocular globes and orbital contents are non-suspicious. OTHER: None. ASPECTS [facility] Stroke Program Early CT Score) - Ganglionic level infarction (caudate, lentiform nuclei, internal capsule, insula, M1-M3 cortex): 7 - Supraganglionic infarction (M4-M6 cortex): 3 Total score (0-10 with 10 being normal): 10 IMPRESSION: 1. No acute intracranial process. 2.  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 3. Moderate chronic small vessel ischemic disease. 4. Critical Value/emergent results were called by telephone at the time of interpretation on 03/10/2017 at 6:10 pm to Dr. Samuel Jester , who verbally acknowledged these results. 5. ASPECTS is 10. Electronically Signed   By: Awilda Metro M.D.   On: 03/10/2017 18:14      Subjective: Patient states she feels well.  Had some itching of her arm when Cipro being infused so it was stopped.  PO trial of Cipro prior to discharge.  She is eating well and all her presenting symptoms have resolved.  Discharge Exam: Vitals:   03/12/17 0755 03/12/17 1008  BP:  123/70  Pulse:  (!) 50  Resp:    Temp:    SpO2: 95% 94%   Vitals:   03/11/17 2054 03/12/17 0510 03/12/17 0755 03/12/17 1008  BP:  (!) 148/79  123/70  Pulse:  61  (!) 50  Resp:  20    Temp:  97.7 F (36.5 C)    TempSrc:  Oral    SpO2: 98% 94% 95% 94%   Weight:      Height:        General: Pt is alert, awake, not in acute distress Cardiovascular: RRR, S1/S2 +, no rubs, no gallops Respiratory: CTA bilaterally, no wheezing, no rhonchi Abdominal: Soft, NT, ND, bowel sounds + Extremities: no edema, no cyanosis    The results of significant diagnostics from this hospitalization (including imaging, microbiology, ancillary and laboratory) are listed below for reference.     Microbiology: Recent Results (from the past 240 hour(s))  Culture, Urine     Status: Abnormal (Preliminary result)   Collection Time: 03/10/17  5:52 PM  Result Value Ref Range Status   Specimen Description URINE, RANDOM  Final   Special Requests NONE  Final   Culture >=100,000 COLONIES/mL GRAM NEGATIVE RODS (A)  Final   Report Status PENDING  Incomplete     Labs: BNP (last 3 results)  Recent Labs  07/07/16 1454  BNP 68.6   Basic Metabolic Panel:  Recent Labs Lab 03/10/17 1740 03/10/17 1801 03/11/17 0801  NA 143 142 138  K 3.8 3.6 3.5  CL 106 103 104  CO2 29  --  25  GLUCOSE 189* 177* 174*  BUN 23* 24* 18  CREATININE 1.15* 1.10* 0.77  CALCIUM 9.0  --  8.3*   Liver Function Tests:  Recent Labs Lab 03/10/17 1740 03/11/17 0801  AST 22 18  ALT 22 19  ALKPHOS 85 70  BILITOT 0.4 0.6  PROT 7.4 6.5  ALBUMIN 3.9 3.5   No results for input(s): LIPASE, AMYLASE in the last 168 hours. No results for input(s): AMMONIA in the last 168 hours. CBC:  Recent Labs Lab 03/10/17 1740 03/10/17 1801 03/11/17 0801 03/12/17 0731  WBC 9.5  --  6.5 5.6  NEUTROABS 6.0  --   --   --   HGB 13.4 13.6 12.1 12.6  HCT 39.6 40.0 36.4 36.6  MCV 98.8  --  97.6 96.3  PLT 217  --  161 152   Cardiac Enzymes: No results for input(s): CKTOTAL, CKMB, CKMBINDEX, TROPONINI in the last 168 hours. BNP: Invalid input(s): POCBNP CBG:  Recent Labs Lab 03/10/17 1750 03/11/17 1740 03/11/17 2047 03/12/17 0732 03/12/17 1156  GLUCAP 170* 265* 255* 175* 272*    D-Dimer No results for input(s): DDIMER in the last 72 hours. Hgb A1c  Recent Labs  03/11/17 0801  HGBA1C  9.1*   Lipid Profile  Recent Labs  03/12/17 0739  CHOL 203*  HDL 46  LDLCALC 85  TRIG 362*  CHOLHDL 4.4   Thyroid function studies No results for input(s): TSH, T4TOTAL, T3FREE, THYROIDAB in the last 72 hours.  Invalid input(s): FREET3 Anemia work up No results for input(s): VITAMINB12, FOLATE, FERRITIN, TIBC, IRON, RETICCTPCT in the last 72 hours. Urinalysis    Component Value Date/Time   COLORURINE YELLOW 03/10/2017 1752   APPEARANCEUR HAZY (A) 03/10/2017 1752   LABSPEC 1.025 03/10/2017 1752   PHURINE 5.0 03/10/2017 1752   GLUCOSEU 50 (A) 03/10/2017 1752   HGBUR NEGATIVE 03/10/2017 1752   BILIRUBINUR NEGATIVE 03/10/2017 1752   KETONESUR NEGATIVE 03/10/2017 1752   PROTEINUR 30 (A) 03/10/2017 1752   UROBILINOGEN 0.2 09/14/2013 1508   NITRITE POSITIVE (A) 03/10/2017 1752   LEUKOCYTESUR SMALL (A) 03/10/2017 1752   Sepsis Labs Invalid input(s): PROCALCITONIN,  WBC,  LACTICIDVEN Microbiology Recent Results (from the past 240 hour(s))  Culture, Urine     Status: Abnormal (Preliminary result)   Collection Time: 03/10/17  5:52 PM  Result Value Ref Range Status   Specimen Description URINE, RANDOM  Final   Special Requests NONE  Final   Culture >=100,000 COLONIES/mL GRAM NEGATIVE RODS (A)  Final   Report Status PENDING  Incomplete     Time coordinating discharge: 40 minutes  SIGNED:   Katrinka Blazing, MD  [facility] 03/12/2017, 2:29 PM Pager [phone] If 7PM-7AM, please contact night-coverage [email or web address] Password [email or web address]

## 2017-03-12 NOTE — Progress Notes (Signed)
ANTICOAGULATION CONSULT NOTE - Pharmacy Consult for Coumadin Indication:Vertebral artery dissection  Allergies  Allergen Reactions  . Cefuroxime Axetil Other (See Comments)    REACTION: unspecified  . Fentanyl And Related Other (See Comments)    Durgesic Patch: unknown reaction  . Niaspan [Niacin Er] Other (See Comments)    Burning all over  . Sulfur Nausea And Vomiting  . Vasotec [Enalapril] Cough  . Welchol [Colesevelam Hcl] Other (See Comments)    Muscle aches and joint pains  . Lyrica [Pregabalin] Rash and Other (See Comments)    Blistering rash around same time as starting drug  . Penicillins Rash    Patient Measurements: Height: 4\' 11"  (149.9 cm) Weight: 208 lb 12.8 oz (94.7 kg) IBW/kg (Calculated) : 43.2 Heparin Dosing Weight: 66 kg  Vital Signs: Temp: 97.7 F (36.5 C) (10/20 0510) Temp Source: Oral (10/20 0510) BP: 148/79 (10/20 0510) Pulse Rate: 61 (10/20 0510)  Labs:  Recent Labs  03/10/17 1740 03/10/17 1801 03/11/17 0801 03/11/17 1523 03/12/17 0731  HGB 13.4 13.6 12.1  --  12.6  HCT 39.6 40.0 36.4  --  36.6  PLT 217  --  161  --  152  APTT 27  --   --   --   --   LABPROT 12.7  --   --   --  13.1  INR 0.96  --   --   --  1.00  HEPARINUNFRC  --   --  0.25* 0.25* 0.67  CREATININE 1.15* 1.10* 0.77  --   --     Estimated Creatinine Clearance: 70.6 mL/min (by C-G formula based on SCr of 0.77 mg/dL).   Medical History: Past Medical History:  Diagnosis Date  . CHF (congestive heart failure) (HCC)   . Depression   . Diabetes mellitus   . Fibromyalgia   . GERD (gastroesophageal reflux disease)   . Hyperlipidemia   . Hypertension   . Superficial thrombophlebitis    right leg    Medications:  Scheduled:  . aspirin EC  81 mg Oral QHS  . fenofibrate  54 mg Oral Daily  . fluticasone  2 spray Each Nare Daily  . gabapentin  300 mg Oral QHS  . insulin aspart  0-5 Units Subcutaneous QHS  . insulin aspart  0-9 Units Subcutaneous TID WC  . insulin  detemir  55 Units Subcutaneous QHS  . ipratropium-albuterol  3 mL Nebulization BID  . loratadine  10 mg Oral Daily  . losartan  100 mg Oral Daily  . metoprolol tartrate  12.5 mg Oral BID  . montelukast  10 mg Oral QHS  . omega-3 acid ethyl esters  2 g Oral BID  . pantoprazole  40 mg Oral Daily  . PARoxetine  40 mg Oral q morning - 10a  . rosuvastatin  40 mg Oral QPM  . warfarin  5 mg Oral Once  . Warfarin - Pharmacist Dosing Inpatient   Does not apply Q24H    Assessment: IV heparin to transition to coumadin Slight drop platelets, HGB, HCT, continue to monitor INR subtherapeutic this AM   Goal of Therapy:  INR 2-3 Monitor platelets by anticoagulation protocol: Yes   Plan:  Coumadin 5 mg po x 1 dose tonight INR/PT daily   Rossana Molchan Bennett 03/12/2017,8:39 AM

## 2017-03-12 NOTE — Progress Notes (Signed)
Pharmacy Antibiotic Note  Tina [name] is a 66 y.o. female admitted on 03/10/2017 with UTI.  Pharmacy has been consulted for Cipro (po) dosing.  Plan: Discontinue Cipro 400 mg IV every 12 hours Start Cipro 500 mg po bid  Height: 4\' 11"  (149.9 cm) Weight: 208 lb 12.8 oz (94.7 kg) IBW/kg (Calculated) : 43.2  Temp (24hrs), Avg:98 F (36.7 C), Min:97.7 F (36.5 C), Max:98.3 F (36.8 C)   Recent Labs Lab 03/10/17 1740 03/10/17 1801 03/11/17 0801 03/12/17 0731  WBC 9.5  --  6.5 5.6  CREATININE 1.15* 1.10* 0.77  --     Estimated Creatinine Clearance: 70.6 mL/min (by C-G formula based on SCr of 0.77 mg/dL).    Allergies  Allergen Reactions  . Cefuroxime Axetil Other (See Comments)    REACTION: unspecified  . Fentanyl And Related Other (See Comments)    Durgesic Patch: unknown reaction  . Niaspan [Niacin Er] Other (See Comments)    Burning all over  . Sulfur Nausea And Vomiting  . Vasotec [Enalapril] Cough  . Welchol [Colesevelam Hcl] Other (See Comments)    Muscle aches and joint pains  . Lyrica [Pregabalin] Rash and Other (See Comments)    Blistering rash around same time as starting drug  . Penicillins Rash    Antimicrobials this admission: Cipro 10/19 >>    >>    Thank you for allowing pharmacy to be a part of this patient's care.  Josephine Igoittman, Elba Schaber Bennett 03/12/2017 10:35 AM

## 2017-03-12 NOTE — Progress Notes (Signed)
ANTICOAGULATION CONSULT NOTE   Pharmacy Consult for HEPARIN Indication: stroke  Allergies  Allergen Reactions  . Cefuroxime Axetil Other (See Comments)    REACTION: unspecified  . Fentanyl And Related Other (See Comments)    Durgesic Patch: unknown reaction  . Niaspan [Niacin Er] Other (See Comments)    Burning all over  . Sulfur Nausea And Vomiting  . Vasotec [Enalapril] Cough  . Welchol [Colesevelam Hcl] Other (See Comments)    Muscle aches and joint pains  . Lyrica [Pregabalin] Rash and Other (See Comments)    Blistering rash around same time as starting drug  . Penicillins Rash   Patient Measurements: Height: 4\' 11"  (149.9 cm) Weight: 208 lb 12.8 oz (94.7 kg) IBW/kg (Calculated) : 43.2 HEPARIN DW (KG): 66.2  Vital Signs: Temp: 97.7 F (36.5 C) (10/20 0510) Temp Source: Oral (10/20 0510) BP: 148/79 (10/20 0510) Pulse Rate: 61 (10/20 0510)  Labs:  Recent Labs  03/10/17 1740 03/10/17 1801 03/11/17 0801 03/11/17 1523 03/12/17 0731  HGB 13.4 13.6 12.1  --  12.6  HCT 39.6 40.0 36.4  --  36.6  PLT 217  --  161  --  152  APTT 27  --   --   --   --   LABPROT 12.7  --   --   --  13.1  INR 0.96  --   --   --  1.00  HEPARINUNFRC  --   --  0.25* 0.25* 0.67  CREATININE 1.15* 1.10* 0.77  --   --    Estimated Creatinine Clearance: 70.6 mL/min (by C-G formula based on SCr of 0.77 mg/dL).  Medical History: Past Medical History:  Diagnosis Date  . CHF (congestive heart failure) (HCC)   . Depression   . Diabetes mellitus   . Fibromyalgia   . GERD (gastroesophageal reflux disease)   . Hyperlipidemia   . Hypertension   . Superficial thrombophlebitis    right leg   Medications:  Prescriptions Prior to Admission  Medication Sig Dispense Refill Last Dose  . aspirin EC 81 MG tablet Take 81 mg by mouth at bedtime. Reported on 07/03/2015   03/09/2017 at Unknown time  . cetirizine (ZYRTEC) 10 MG tablet Take 1 tablet (10 mg total) by mouth daily. 90 tablet 1 03/10/2017 at  Unknown time  . chlorhexidine (HIBICLENS) 4 % external liquid Apply topically daily as needed. 120 mL 0 Taking  . clotrimazole (LOTRIMIN) 1 % cream Apply 1 application topically 2 (two) times daily. 30 g 0 03/10/2017 at Unknown time  . clotrimazole-betamethasone (LOTRISONE) cream Apply 1 application topically 2 (two) times daily. 60 g 1 03/10/2017 at Unknown time  . fenofibrate 54 MG tablet Take 1 tablet (54 mg total) by mouth daily. 90 tablet 3 03/10/2017 at Unknown time  . furosemide (LASIX) 40 MG tablet Take 1 tablet twice a day (Patient taking differently: Take 40 mg by mouth 2 (two) times daily as needed for fluid. Take 1 tablet twice a day) 180 tablet 3 unknown  . gabapentin (NEURONTIN) 300 MG capsule Take 1 capsule (300 mg total) by mouth at bedtime. 30 capsule 3 03/09/2017 at Unknown time  . HYDROcodone-acetaminophen (NORCO) 7.5-325 MG tablet Take 1 tablet by mouth every 6 (six) hours as needed for moderate pain. 45 tablet 0 unknown  . insulin aspart (NOVOLOG) 100 UNIT/ML injection Inject 40 units before breakfast & lunch and 60 units at supper 120 mL 3 03/10/2017 at Unknown time  . insulin detemir (LEVEMIR) 100 UNIT/ML injection  Inject 0.55 mLs (55 Units total) into the skin at bedtime. 60 mL 3 03/09/2017 at Unknown time  . ipratropium (ATROVENT) 0.02 % nebulizer solution Take 2.5 mLs (0.5 mg total) by nebulization every 4 (four) hours as needed for wheezing or shortness of breath. 75 mL 3 Past Week at Unknown time  . ipratropium (ATROVENT) 0.06 % nasal spray Place 2 sprays into both nostrils 4 (four) times daily as needed for rhinitis. 15 mL 3 unknown  . losartan (COZAAR) 100 MG tablet Take 1 tablet (100 mg total) by mouth daily. 90 tablet 3 03/10/2017 at Unknown time  . metoprolol tartrate (LOPRESSOR) 25 MG tablet Take 0.5 tablets (12.5 mg total) by mouth 2 (two) times daily. 90 tablet 3 03/10/2017 at 0930  . montelukast (SINGULAIR) 10 MG tablet Take 1 tablet (10 mg total) by mouth at  bedtime. 30 tablet 6 03/09/2017 at Unknown time  . nystatin (MYCOSTATIN/NYSTOP) powder Apply topically 4 (four) times daily. 180 g 2 03/10/2017 at Unknown time  . Omega-3 Fatty Acids (FISH OIL) 1000 MG CAPS Take 2 capsules by mouth 2 (two) times daily.   Past Month at Unknown time  . omeprazole (PRILOSEC) 20 MG capsule TAKE 1 CAPSULE BY MOUTH EVERY DAY 90 capsule 3 03/10/2017 at Unknown time  . PARoxetine (PAXIL) 40 MG tablet Take 1 tablet (40 mg total) by mouth every morning. 90 tablet 3 03/10/2017 at Unknown time  . potassium chloride (K-DUR) 10 MEQ tablet Take 1 tablet (10 mEq total) by mouth 2 (two) times daily. (Patient taking differently: Take 10 mEq by mouth 2 (two) times daily as needed (when taking lasix). ) 180 tablet 3 unknown  . rosuvastatin (CRESTOR) 40 MG tablet Take 1 tablet (40 mg total) by mouth every evening. 90 tablet 3 03/09/2017 at Unknown time  . TRUEPLUS INSULIN SYRINGE 31G X 5/16" 1 ML MISC USE AS DIRECTED TO INJECT 100 each 2 Taking  . insulin lispro (HUMALOG) 100 UNIT/ML injection 3 times a day (just before each meal) 40-40-60 units (Patient not taking: Reported on 03/10/2017) 13 vial 3 Not Taking at Unknown time   Assessment: 66yo female with slurred speech and facial droop.  Asked to initiate Heparin for stroke.  Heparin level is slightly above goal  Goal of Therapy:  Heparin level 0.3-0.5 Monitor platelets by anticoagulation protocol: Yes   Plan:  Decrease Heparin infusion to 1150  units/hr Recheck heparin level in 6-8 hrs Monitor heparin level and CBC  Hollis Oh Bennett 03/12/2017,8:32 AM

## 2017-03-12 NOTE — Progress Notes (Signed)
Patient states understanding of discharge instructions.  

## 2017-03-13 LAB — URINE CULTURE: Culture: >=100000 — AB

## 2017-03-14 ENCOUNTER — Other Ambulatory Visit: Payer: Self-pay | Admitting: *Deleted

## 2017-03-14 ENCOUNTER — Encounter: Payer: Self-pay | Admitting: Neurology

## 2017-03-14 MED ORDER — NITROFURANTOIN MONOHYD MACRO 100 MG PO CAPS: 100.0000 mg | ORAL_CAPSULE | Freq: Two times a day (BID) | ORAL | 0 refills | Status: DC

## 2017-03-28 ENCOUNTER — Inpatient Hospital Stay: Payer: Medicare Other | Admitting: Family Medicine

## 2017-03-30 ENCOUNTER — Ambulatory Visit (INDEPENDENT_AMBULATORY_CARE_PROVIDER_SITE_OTHER): Payer: Medicare Other | Admitting: Family Medicine

## 2017-03-30 ENCOUNTER — Encounter: Payer: Self-pay | Admitting: Family Medicine

## 2017-03-30 VITALS — BP 132/68 | HR 62 | Temp 98.6°F | Resp 14 | Ht 61.0 in | Wt 205.0 lb

## 2017-03-30 DIAGNOSIS — N390 Urinary tract infection, site not specified: Secondary | ICD-10-CM | POA: Diagnosis not present

## 2017-03-30 DIAGNOSIS — I639 Cerebral infarction, unspecified: Secondary | ICD-10-CM

## 2017-03-30 DIAGNOSIS — I7774 Dissection of vertebral artery: Secondary | ICD-10-CM

## 2017-03-30 DIAGNOSIS — I6523 Occlusion and stenosis of bilateral carotid arteries: Secondary | ICD-10-CM

## 2017-03-30 DIAGNOSIS — E1165 Type 2 diabetes mellitus with hyperglycemia: Secondary | ICD-10-CM

## 2017-03-30 LAB — URINALYSIS, ROUTINE W REFLEX MICROSCOPIC
Bilirubin Urine: NEGATIVE
GLUCOSE, UA: NEGATIVE
Hgb urine dipstick: NEGATIVE
Ketones, ur: NEGATIVE
LEUKOCYTES UA: NEGATIVE
Nitrite: NEGATIVE
PROTEIN: NEGATIVE
Specific Gravity, Urine: 1.015 (ref 1.001–1.03)
pH: 5.5 (ref 5.0–8.0)

## 2017-03-30 MED ORDER — CLOPIDOGREL BISULFATE 75 MG PO TABS: 75.0000 mg | ORAL_TABLET | Freq: Every day | ORAL | 1 refills | Status: DC

## 2017-03-30 MED ORDER — HYDROCODONE-ACETAMINOPHEN 5-325 MG PO TABS: 1.0000 | ORAL_TABLET | Freq: Four times a day (QID) | ORAL | 0 refills | Status: DC | PRN

## 2017-03-30 NOTE — Patient Instructions (Addendum)
Novolog- change 40 units with meals COntinue LEVEMIR 60 units at bedtime We will call with urine result  Take the plavix Referral to Vascular Heart appointment to be scheduled  Change CPE to January
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
## 2017-03-31 LAB — CBC WITH DIFFERENTIAL/PLATELET
BASOS ABS: 46 {cells}/uL (ref 0–200)
Basophils Relative: 0.4 %
EOS ABS: 93 {cells}/uL (ref 15–500)
Eosinophils Relative: 0.8 %
HEMATOCRIT: 37.6 % (ref 35.0–45.0)
Hemoglobin: 13.2 g/dL (ref 11.7–15.5)
Lymphs Abs: 3167 cells/uL (ref 850–3900)
MCH: 33 pg (ref 27.0–33.0)
MCHC: 35.1 g/dL (ref 32.0–36.0)
MCV: 94 fL (ref 80.0–100.0)
MPV: 12.4 fL (ref 7.5–12.5)
Monocytes Relative: 7.7 %
Neutro Abs: 7401 cells/uL (ref 1500–7800)
Neutrophils Relative %: 63.8 %
PLATELETS: 250 10*3/uL (ref 140–400)
RBC: 4 10*6/uL (ref 3.80–5.10)
RDW: 13 % (ref 11.0–15.0)
TOTAL LYMPHOCYTE: 27.3 %
WBC: 11.6 10*3/uL — ABNORMAL HIGH (ref 3.8–10.8)
WBCMIX: 893 {cells}/uL (ref 200–950)

## 2017-03-31 LAB — BASIC METABOLIC PANEL
BUN/Creatinine Ratio: 16 (calc) (ref 6–22)
BUN: 21 mg/dL (ref 7–25)
CHLORIDE: 102 mmol/L (ref 98–110)
CO2: 27 mmol/L (ref 20–32)
Calcium: 9.4 mg/dL (ref 8.6–10.4)
Creat: 1.35 mg/dL — ABNORMAL HIGH (ref 0.50–0.99)
Glucose, Bld: 225 mg/dL — ABNORMAL HIGH (ref 65–99)
POTASSIUM: 4.5 mmol/L (ref 3.5–5.3)
Sodium: 138 mmol/L (ref 135–146)

## 2017-03-31 NOTE — Assessment & Plan Note (Signed)
Uncontrolled, long standing problem But hypoglycemia She is trying to get f/u with endocrine but can not afford all visits at this time Decrease her novolog to 40 unit with eachmeal Continue levemir 60 units

## 2017-03-31 NOTE — Assessment & Plan Note (Signed)
We spent 15 minutes coordinating her appointments Will be seen by cardiology on Friday Will see vascular next week, per notes needs repeat imaging in about 4 weeks  Started on the plavix today  Has neurology apppoitnment next week as well   Recheck URINE sample to ensure infection is clear   For headaches seem mild and stress related, no changes on neuro exam- advised no advil with plavix and ASA, has used low dose norco in past for her fibromyalgia and OA , so this was prescribed for moderate HA otherwise take tylenol

## 2017-04-01 ENCOUNTER — Ambulatory Visit: Payer: Medicare Other | Admitting: Cardiovascular Disease

## 2017-04-07 ENCOUNTER — Ambulatory Visit (INDEPENDENT_AMBULATORY_CARE_PROVIDER_SITE_OTHER): Payer: Medicare Other | Admitting: Neurology

## 2017-04-07 ENCOUNTER — Encounter: Payer: Self-pay | Admitting: Neurology

## 2017-04-07 VITALS — BP 134/82 | HR 58 | Ht 59.0 in | Wt 200.0 lb

## 2017-04-07 DIAGNOSIS — G459 Transient cerebral ischemic attack, unspecified: Secondary | ICD-10-CM

## 2017-04-07 DIAGNOSIS — I10 Essential (primary) hypertension: Secondary | ICD-10-CM | POA: Diagnosis not present

## 2017-04-07 DIAGNOSIS — Z720 Tobacco use: Secondary | ICD-10-CM | POA: Diagnosis not present

## 2017-04-07 DIAGNOSIS — I7774 Dissection of vertebral artery: Secondary | ICD-10-CM | POA: Diagnosis not present

## 2017-04-07 DIAGNOSIS — E785 Hyperlipidemia, unspecified: Secondary | ICD-10-CM | POA: Diagnosis not present

## 2017-04-07 NOTE — Addendum Note (Signed)
Addended by: Dorthy CoolerBURNS, SANDRA J on: 04/07/2017 10:53 AM   Modules accepted: Orders

## 2017-04-07 NOTE — Progress Notes (Signed)
NEUROLOGY CONSULTATION NOTE  Tina [name] Hamidi MRN: [id number] DOB: [birth year]  Referring provider: Debbra RidingAlexandria Kadolph, MD Primary care provider: Milinda AntisKawanta Bottineau, MD  Reason for consult:  TIA  HISTORY OF PRESENT ILLNESS: Tina [name] Bacote is a 66 year old female right who presents for transient ischemic attack.  She is accompanied by her husband who supplements history.  History supplemented by hospital records.  CT and CTA personally reviewed.  She was admitted to [facility] from 03/10/17 to 03/12/17 for transient ischemic attack.  She exhibited dizziness, disorientation, headache, unsteady gait, slurred speech and blurred vision.  She did not have any unilateral numbness or weakness.  Due to rapidly improving symptoms, she did not receive tPA.  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  She was started on IV heparin with transition to Coumadin.  She declined transfer to [facility] for further evaluation and treatment.  Via phone, neurology recommended ASA and Plavix instead of Coumadin prior to discharge.  However, there was a misunderstanding and she has only been taking ASA alone.  CTA HEAD:  1.  Occluded proximal right V4 segment; 2. 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 Patent posterior cerebral arteries and moderate stenosis of left P1 segment. CTA NECK: 1.  Flow limiting right vertebral artery dissection, potentially acute; 2. 60% right internal carotid artery origin stenosis, less than 50% left internal carotid artery origin stenosis.  LABS:  LDL 85, glucose 175  She has been doing well.  PAST MEDICAL HISTORY: Past Medical History:  Diagnosis Date  . CHF (congestive heart failure) (HCC)   . Depression   . Diabetes mellitus   . Fibromyalgia   . GERD (gastroesophageal reflux disease)   . Hyperlipidemia   .  Hypertension   . Superficial thrombophlebitis    right leg    PAST SURGICAL HISTORY: Past Surgical History:  Procedure Laterality Date  . CHOLECYSTECTOMY  1991  . HERNIA REPAIR  approx 1969  . NECK SURGERY  1998  . TONSILLECTOMY AND ADENOIDECTOMY  1966    MEDICATIONS: Current Outpatient Medications on File Prior to Visit  Medication Sig Dispense Refill  . aspirin EC 325 MG EC tablet Take 1 tablet (325 mg total) by mouth at bedtime. 30 tablet 0  . cetirizine (ZYRTEC) 10 MG tablet Take 1 tablet (10 mg total) by mouth daily. 90 tablet 1  . chlorhexidine (HIBICLENS) 4 % external liquid Apply topically daily as needed. 120 mL 0  . clopidogrel (PLAVIX) 75 MG tablet Take 1 tablet (75 mg total) daily by mouth. 90 tablet 1  . clotrimazole (LOTRIMIN) 1 % cream Apply 1 application topically 2 (two) times daily. 30 g 0  . clotrimazole-betamethasone (LOTRISONE) cream Apply 1 application topically 2 (two) times daily. 60 g 1  . fenofibrate 54 MG tablet Take 1 tablet (54 mg total) by mouth daily. 90 tablet 3  . furosemide (LASIX) 40 MG tablet Take 1 tablet twice a day (Patient taking differently: Take 40 mg by mouth 2 (two) times daily as needed for fluid. Take 1 tablet twice a day) 180 tablet 3  . gabapentin (NEURONTIN) 300 MG capsule Take 1 capsule (300 mg total) by mouth at bedtime. 30 capsule 3  . HYDROcodone-acetaminophen (NORCO) 5-325 MG tablet Take 1 tablet every 6 (six) hours as needed by mouth for moderate pain. 30 tablet 0  . insulin aspart (NOVOLOG) 100  UNIT/ML injection Inject 40 units before breakfast & lunch and 60 units at supper 120 mL 3  . insulin detemir (LEVEMIR) 100 UNIT/ML injection Inject 0.55 mLs (55 Units total) into the skin at bedtime. 60 mL 3  . ipratropium (ATROVENT) 0.02 % nebulizer solution Take 2.5 mLs (0.5 mg total) by nebulization every 4 (four) hours as needed for wheezing or shortness of breath. 75 mL 3  . ipratropium (ATROVENT) 0.06 % nasal spray Place 2 sprays into  both nostrils 4 (four) times daily as needed for rhinitis. 15 mL 3  . losartan (COZAAR) 100 MG tablet Take 1 tablet (100 mg total) by mouth daily. 90 tablet 3  . metoprolol tartrate (LOPRESSOR) 25 MG tablet 0.25 tablets PO twice a day  0  . montelukast (SINGULAIR) 10 MG tablet Take 1 tablet (10 mg total) by mouth at bedtime. 30 tablet 6  . nystatin (MYCOSTATIN/NYSTOP) powder Apply topically 4 (four) times daily. 180 g 2  . Omega-3 Fatty Acids (FISH OIL) 1000 MG CAPS Take 2 capsules by mouth 2 (two) times daily.    Marland Kitchen omeprazole (PRILOSEC) 20 MG capsule TAKE 1 CAPSULE BY MOUTH EVERY DAY 90 capsule 3  . PARoxetine (PAXIL) 40 MG tablet Take 1 tablet (40 mg total) by mouth every morning. 90 tablet 3  . potassium chloride (K-DUR) 10 MEQ tablet Take 1 tablet (10 mEq total) by mouth 2 (two) times daily. (Patient taking differently: Take 10 mEq by mouth 2 (two) times daily as needed (when taking lasix). ) 180 tablet 3  . rosuvastatin (CRESTOR) 40 MG tablet Take 1 tablet (40 mg total) by mouth every evening. 90 tablet 3  . TRUEPLUS INSULIN SYRINGE 31G X 5/16" 1 ML MISC USE AS DIRECTED TO INJECT 100 each 2   No current facility-administered medications on file prior to visit.     ALLERGIES: Allergies  Allergen Reactions  . Cefuroxime Axetil Other (See Comments)    REACTION: unspecified  . Fentanyl And Related Other (See Comments)    Durgesic Patch: unknown reaction  . Niaspan [Niacin Er] Other (See Comments)    Burning all over  . Sulfur Nausea And Vomiting  . Vasotec [Enalapril] Cough  . Welchol [Colesevelam Hcl] Other (See Comments)    Muscle aches and joint pains  . Lyrica [Pregabalin] Rash and Other (See Comments)    Blistering rash around same time as starting drug  . Penicillins Rash    FAMILY HISTORY: Family History  Problem Relation Age of Onset  . Hypertension Mother   . Hyperlipidemia Mother   . Heart disease Mother   . Stroke Sister   . Hypertension Sister   . Hyperlipidemia  Sister   . Heart disease Sister   . Diabetes Maternal Grandmother   . Diabetes Daughter     SOCIAL HISTORY: Social History   Socioeconomic History  . Marital status: Married    Spouse name: Not on file  . Number of children: Not on file  . Years of education: Not on file  . Highest education level: Not on file  Social Needs  . Financial resource strain: Not on file  . Food insecurity - worry: Not on file  . Food insecurity - inability: Not on file  . Transportation needs - medical: Not on file  . Transportation needs - non-medical: Not on file  Occupational History  . Not on file  Tobacco Use  . Smoking status: Former Smoker    Types: Cigarettes  . Smokeless tobacco: Never Used  Substance and Sexual Activity  . Alcohol use: No  . Drug use: No  . Sexual activity: Not Currently  Other Topics Concern  . Not on file  Social History Narrative  . Not on file    REVIEW OF SYSTEMS: Constitutional: No fevers, chills, or sweats, no generalized fatigue, change in appetite Eyes: No visual changes, double vision, eye pain Ear, nose and throat: No hearing loss, ear pain, nasal congestion, sore throat Cardiovascular: No chest pain, palpitations Respiratory:  No shortness of breath at rest or with exertion, wheezes GastrointestinaI: No nausea, vomiting, diarrhea, abdominal pain, fecal incontinence Genitourinary:  No dysuria, urinary retention or frequency Musculoskeletal:  No neck pain, back pain Integumentary: No rash, pruritus, skin lesions Neurological: as above Psychiatric: No depression, insomnia, anxiety Endocrine: No palpitations, fatigue, diaphoresis, mood swings, change in appetite, change in weight, increased thirst Hematologic/Lymphatic:  No purpura, petechiae. Allergic/Immunologic: no itchy/runny eyes, nasal congestion, recent allergic reactions, rashes  PHYSICAL EXAM: Vitals:   04/07/17 1001  BP: 134/82  Pulse: (!) 58  SpO2: 95%   General: No acute distress.   Patient appears well-groomed.  Head:  Normocephalic/atraumatic Eyes:  fundi examined but not visualized Neck: supple, no paraspinal tenderness, full range of motion Back: No paraspinal tenderness Heart: regular rate and rhythm Lungs: Clear to auscultation bilaterally. Vascular: No carotid bruits. Neurological Exam: Mental status: alert and oriented to person, place, and time, recent and remote memory intact, fund of knowledge intact, attention and concentration intact, speech fluent and not dysarthric, language intact. Cranial nerves: CN I: not tested CN II: pupils equal, round and reactive to light, visual fields intact CN III, IV, VI:  full range of motion, no nystagmus, no ptosis CN V: facial sensation intact CN VII: upper and lower face symmetric CN VIII: hearing intact CN IX, X: gag intact, uvula midline CN XI: sternocleidomastoid and trapezius muscles intact CN XII: tongue midline Bulk & Tone: normal, no fasciculations. Motor:  5/5 throughout  Sensation:  Pinprick and vibration sensation intact. Deep Tendon Reflexes:  2+ throughout, toes downgoing.  Finger to nose testing:  Without dysmetria.  Heel to shin:  Without dysmetria.  Gait:  Normal station and stride.  Able to turn. Romberg negative.  IMPRESSION: Right vertebral artery dissection HTN HLD Tobacco use  PLAN: 1.  Advised to start ASA and Plavix for 2 more months and will likely continue on Plavix and discontinue ASA. 2.  Check MRI of brain to evaluate for any posterior/cerebellar stroke 3.  In 2 months (prior to follow up), recheck CTA of neck 4.  Continue statin therapy and blood pressure control 5.  Smoking cessation instruction/counseling given:  counseled patient on the dangers of tobacco use, advised patient to stop smoking, and reviewed strategies to maximize success. 6. Follow up in 2 months.  Thank you for allowing me to take part in the care of this patient.  Shon MilletAdam Jaffe, DO  CC: Milinda AntisKawanta Salisbury,  MD  Debbra RidingAlexandria Kadolph, MD

## 2017-04-07 NOTE — Patient Instructions (Signed)
1.  Take aspirin AND Plavix.  Follow up in 2 months and we will likely stop one of them (probably aspirin) 2.  We will check MRI of brain now 3.  In 2 months (prior to follow up), we will check CTA of neck 4.  Continue Crestor 5.  Try to quit smoking completely

## 2017-04-08 ENCOUNTER — Encounter: Payer: Medicare Other | Admitting: Family Medicine

## 2017-04-08 ENCOUNTER — Encounter: Payer: Self-pay | Admitting: Vascular Surgery

## 2017-04-08 ENCOUNTER — Ambulatory Visit (INDEPENDENT_AMBULATORY_CARE_PROVIDER_SITE_OTHER): Payer: Medicare Other | Admitting: Vascular Surgery

## 2017-04-08 ENCOUNTER — Other Ambulatory Visit: Payer: Self-pay | Admitting: *Deleted

## 2017-04-08 VITALS — BP 140/83 | HR 111 | Temp 98.1°F | Resp 18 | Ht 59.0 in | Wt 201.6 lb

## 2017-04-08 DIAGNOSIS — I7774 Dissection of vertebral artery: Secondary | ICD-10-CM

## 2017-04-08 MED ORDER — PANTOPRAZOLE SODIUM 40 MG PO TBEC: 40.0000 mg | DELAYED_RELEASE_TABLET | Freq: Every day | ORAL | 3 refills | Status: DC

## 2017-04-08 NOTE — Progress Notes (Signed)
HISTORY AND PHYSICAL     CC:  Follow up from hospital stay Requesting Provider:  Salley Scarleturham, Kawanta F, MD  HPI: This is a 66 y.o. female who presented to [facility] a couple of weeks ago after having a headache for several days, which was unusual for her.  She was taking Advil and the headaches would improve.  She and her friend went to the grocery store.  She was feeling well, so she did not go in.  While sitting in the car, she had a weird sensation in her head that she says "felt like a mixer".  She called her doctor while in the car and it was recommended that she go to the ER.   She did have some dizziness prior to her headaches and still has some dizziness when she closes her eyes or reaches above her head.   She has a remote hx of migraines when she was younger.   She went to the ER and was placed on a heparin gtt.  She had a CTA of the head and this revealed a possible acute verterbral artery dissection.  She stayed in the hospital for 4 days.  She was discharged with instructions to increase her aspirin to 325mg  daily and to start Plavix.  She has not started her Plavix as she is waiting on her mail order.    She states that she is supposed to have a test on Sunday evening at 7pm, which is an MRI.    She has fibromyalgia and is on Neurontin & Norco.  She is on insulin for diabetes.  The pt is on a statin for cholesterol management. She takes a beta blocker for her blood pressure management.   She recently quit smoking, but states she was never a chain smoker.   Past Medical History:  Diagnosis Date  . CHF (congestive heart failure) (HCC)   . Depression   . Diabetes mellitus   . Fibromyalgia   . GERD (gastroesophageal reflux disease)   . Hyperlipidemia   . Hypertension   . Stroke (HCC)   . Superficial thrombophlebitis    right leg    Past Surgical History:  Procedure Laterality Date  . CHOLECYSTECTOMY  1991  . HERNIA REPAIR  approx 1969  . NECK SURGERY  1998  . TONSILLECTOMY  AND ADENOIDECTOMY  1966    Allergies  Allergen Reactions  . Cefuroxime Axetil Other (See Comments)    REACTION: unspecified  . Fentanyl And Related Other (See Comments)    Durgesic Patch: unknown reaction  . Niaspan [Niacin Er] Other (See Comments)    Burning all over  . Sulfur Nausea And Vomiting  . Vasotec [Enalapril] Cough  . Welchol [Colesevelam Hcl] Other (See Comments)    Muscle aches and joint pains  . Lyrica [Pregabalin] Rash and Other (See Comments)    Blistering rash around same time as starting drug  . Penicillins Rash    Current Outpatient Medications  Medication Sig Dispense Refill  . aspirin EC 325 MG EC tablet Take 1 tablet (325 mg total) by mouth at bedtime. 30 tablet 0  . cetirizine (ZYRTEC) 10 MG tablet Take 1 tablet (10 mg total) by mouth daily. 90 tablet 1  . chlorhexidine (HIBICLENS) 4 % external liquid Apply topically daily as needed. 120 mL 0  . clopidogrel (PLAVIX) 75 MG tablet Take 1 tablet (75 mg total) daily by mouth. 90 tablet 1  . clotrimazole (LOTRIMIN) 1 % cream Apply 1 application topically 2 (two)  times daily. 30 g 0  . clotrimazole-betamethasone (LOTRISONE) cream Apply 1 application topically 2 (two) times daily. 60 g 1  . fenofibrate 54 MG tablet Take 1 tablet (54 mg total) by mouth daily. 90 tablet 3  . furosemide (LASIX) 40 MG tablet Take 1 tablet twice a day (Patient taking differently: Take 40 mg by mouth 2 (two) times daily as needed for fluid. Take 1 tablet twice a day) 180 tablet 3  . gabapentin (NEURONTIN) 300 MG capsule Take 1 capsule (300 mg total) by mouth at bedtime. 30 capsule 3  . HYDROcodone-acetaminophen (NORCO) 5-325 MG tablet Take 1 tablet every 6 (six) hours as needed by mouth for moderate pain. 30 tablet 0  . insulin aspart (NOVOLOG) 100 UNIT/ML injection Inject 40 units before breakfast & lunch and 60 units at supper 120 mL 3  . insulin detemir (LEVEMIR) 100 UNIT/ML injection Inject 0.55 mLs (55 Units total) into the skin at  bedtime. 60 mL 3  . ipratropium (ATROVENT) 0.02 % nebulizer solution Take 2.5 mLs (0.5 mg total) by nebulization every 4 (four) hours as needed for wheezing or shortness of breath. 75 mL 3  . ipratropium (ATROVENT) 0.06 % nasal spray Place 2 sprays into both nostrils 4 (four) times daily as needed for rhinitis. 15 mL 3  . losartan (COZAAR) 100 MG tablet Take 1 tablet (100 mg total) by mouth daily. 90 tablet 3  . metoprolol tartrate (LOPRESSOR) 25 MG tablet 0.25 tablets PO twice a day  0  . montelukast (SINGULAIR) 10 MG tablet Take 1 tablet (10 mg total) by mouth at bedtime. 30 tablet 6  . nystatin (MYCOSTATIN/NYSTOP) powder Apply topically 4 (four) times daily. 180 g 2  . Omega-3 Fatty Acids (FISH OIL) 1000 MG CAPS Take 2 capsules by mouth 2 (two) times daily.    . pantoprazole (PROTONIX) 40 MG tablet Take 1 tablet (40 mg total) daily by mouth. 90 tablet 3  . PARoxetine (PAXIL) 40 MG tablet Take 1 tablet (40 mg total) by mouth every morning. 90 tablet 3  . potassium chloride (K-DUR) 10 MEQ tablet Take 1 tablet (10 mEq total) by mouth 2 (two) times daily. (Patient taking differently: Take 10 mEq by mouth 2 (two) times daily as needed (when taking lasix). ) 180 tablet 3  . rosuvastatin (CRESTOR) 40 MG tablet Take 1 tablet (40 mg total) by mouth every evening. 90 tablet 3  . TRUEPLUS INSULIN SYRINGE 31G X 5/16" 1 ML MISC USE AS DIRECTED TO INJECT 100 each 2   No current facility-administered medications for this visit.     Family History  Problem Relation Age of Onset  . Hypertension Mother   . Hyperlipidemia Mother   . Heart disease Mother   . Stroke Sister   . Hypertension Sister   . Hyperlipidemia Sister   . Heart disease Sister   . Diabetes Maternal Grandmother   . Diabetes Daughter     Social History   Socioeconomic History  . Marital status: Married    Spouse name: Not on file  . Number of children: Not on file  . Years of education: Not on file  . Highest education level: Not  on file  Social Needs  . Financial resource strain: Not on file  . Food insecurity - worry: Not on file  . Food insecurity - inability: Not on file  . Transportation needs - medical: Not on file  . Transportation needs - non-medical: Not on file  Occupational History  .  Not on file  Tobacco Use  . Smoking status: Former Smoker    Types: Cigarettes  . Smokeless tobacco: Never Used  Substance and Sexual Activity  . Alcohol use: No  . Drug use: No  . Sexual activity: Not Currently  Other Topics Concern  . Not on file  Social History Narrative  . Not on file     REVIEW OF SYSTEMS:   [X]  denotes positive finding, [ ]  denotes negative finding Cardiac  Comments:  Chest pain or chest pressure:    Shortness of breath upon exertion: x   Short of breath when lying flat: x   Irregular heart rhythm:        Vascular    Pain in calf, thigh, or hip brought on by ambulation:    Pain in feet at night that wakes you up from your sleep:     Blood clot in your veins:    Leg swelling:  x       Pulmonary    Oxygen at home:    Productive cough:     Wheezing:  x       Neurologic    Sudden weakness in arms or legs:     Sudden numbness in arms or legs:     Sudden onset of difficulty speaking or slurred speech:    Temporary loss of vision in one eye:     Problems with dizziness:  x       Gastrointestinal    Blood in stool:     Vomited blood:         Genitourinary    Burning when urinating:     Blood in urine:        Psychiatric    Major depression:         Hematologic    Bleeding problems:    Problems with blood clotting too easily:        Skin    Rashes or ulcers:        Constitutional    Fever or chills:      PHYSICAL EXAMINATION:  Vitals:   04/08/17 1445  BP: 140/83  Pulse: (!) 111  Resp: 18  Temp: 98.1 F (36.7 C)  SpO2: 95%   Vitals:   04/08/17 1445  Weight: 201 lb 9.6 oz (91.4 kg)  Height: 4\' 11"  (1.499 m)   Body mass index is 40.72  kg/m.  General:  WDWN in NAD; vital signs documented above Gait: Not observed HENT: WNL, normocephalic Pulmonary: normal non-labored breathing , without Rales, rhonchi,  wheezing Cardiac: regular HR, without  Murmurs without carotid bruits Abdomen: soft, NT, no masses Skin: without rashes Vascular Exam/Pulses:  Right Left  Radial 2+ (normal) 2+ (normal)  Ulnar Unable to palpate  Unable to palpate   DP 2+ (normal) 2+ (normal)  PT Unable to palpate  Unable to palpate    Extremities: without ischemic changes, without Gangrene , without cellulitis; without open wounds;  Musculoskeletal: no muscle wasting or atrophy  Neurologic: A&O X 3;  No focal weakness or paresthesias are detected Psychiatric:  The pt has Normal affect.   Non-Invasive Vascular Imaging:   CTA neck 03/10/17: IMPRESSION: CTA NECK:  1. Flow limiting RIGHT vertebral artery dissection, potentially acute. 2. 60% RIGHT internal carotid artery origin stenosis, less than 50% LEFT internal carotid artery origin stenosis. 3. 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 CTA HEAD:  1. Occluded proximal RIGHT V4 segment. Retrograde flow/reconstitution distal RIGHT V4  segment. 2. 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 3. Patent cerebral artery's without severe stenosis. Moderate stenosis LEFT P1 segment.  Pt meds includes: Statin:  Yes.   Beta Blocker:  Yes.   Aspirin:  Yes.   ACEI:  No. ARB:  No. CCB use:  No Other Antiplatelet/Anticoagulant:  Yes Plavix   ASSESSMENT/PLAN:: 66 y.o. female with recent CVA and right vertebral artery dissection   -pt is doing well since her discharge from the hospital.  Her sx were most likely caused by the vertebral artery dissection, but impossible to know for sure given there is CTA prior to event.  -nothing to do from surgical standpoint.  Continue Plavix/aspirin per neurology -given she does have 60% stenosis in the right ICA  and < 50% stenosis in the left ICA, we will have her return to our office in 6 months for carotid duplex and f/u with Dr. Randie Heinz.    Doreatha Massed, PA-C [facility] [phone]  Clinic MD:  Pt seen and examined with Dr. Randie Heinz   I have interviewed and examined patient with PA and agree with assessment and plan above.   Macai Sisneros C. Randie Heinz, MD [facility] [place] Office: [phone] Pager: [phone]

## 2017-04-11 NOTE — Addendum Note (Signed)
Addended by: Burton ApleyPETTY, Kaisha Wachob A on: 04/11/2017 04:21 PM   Modules accepted: Orders

## 2017-04-17 ENCOUNTER — Other Ambulatory Visit: Payer: Medicare Other

## 2017-04-17 ENCOUNTER — Ambulatory Visit
Admission: RE | Admit: 2017-04-17 | Discharge: 2017-04-17 | Disposition: A | Discharge status: Home / Self Care | Payer: Medicare Other | Source: Ambulatory Visit | Attending: Neurology | Admitting: Neurology

## 2017-04-17 DIAGNOSIS — G459 Transient cerebral ischemic attack, unspecified: Secondary | ICD-10-CM

## 2017-04-17 DIAGNOSIS — I639 Cerebral infarction, unspecified: Secondary | ICD-10-CM | POA: Diagnosis not present

## 2017-04-20 ENCOUNTER — Telehealth: Payer: Self-pay

## 2017-04-20 ENCOUNTER — Other Ambulatory Visit: Payer: Self-pay | Admitting: Neurology

## 2017-04-20 ENCOUNTER — Ambulatory Visit
Admission: RE | Admit: 2017-04-20 | Discharge: 2017-04-20 | Disposition: A | Discharge status: Home / Self Care | Payer: Medicare Other | Source: Ambulatory Visit | Attending: Neurology | Admitting: Neurology

## 2017-04-20 DIAGNOSIS — I7774 Dissection of vertebral artery: Secondary | ICD-10-CM

## 2017-04-20 NOTE — Telephone Encounter (Signed)
-----   Message from Drema DallasAdam R Jaffe, DO sent at 04/18/2017  7:13 AM EST ----- There does not appear to be any stroke correlating with the vertebral artery dissection.  No further recommendations at this time.

## 2017-04-20 NOTE — Telephone Encounter (Signed)
LM on VM for Pt to rtrn my call about MRI results

## 2017-04-21 ENCOUNTER — Other Ambulatory Visit: Payer: Self-pay | Admitting: *Deleted

## 2017-04-21 ENCOUNTER — Ambulatory Visit: Payer: Medicare Other | Admitting: Cardiovascular Disease

## 2017-04-21 ENCOUNTER — Telehealth: Payer: Self-pay

## 2017-04-21 ENCOUNTER — Encounter: Payer: Self-pay | Admitting: Cardiovascular Disease

## 2017-04-21 VITALS — BP 125/70 | HR 70 | Ht 59.0 in | Wt 205.0 lb

## 2017-04-21 DIAGNOSIS — E782 Mixed hyperlipidemia: Secondary | ICD-10-CM

## 2017-04-21 DIAGNOSIS — I6523 Occlusion and stenosis of bilateral carotid arteries: Secondary | ICD-10-CM

## 2017-04-21 DIAGNOSIS — G4733 Obstructive sleep apnea (adult) (pediatric): Secondary | ICD-10-CM | POA: Diagnosis not present

## 2017-04-21 DIAGNOSIS — I639 Cerebral infarction, unspecified: Secondary | ICD-10-CM

## 2017-04-21 DIAGNOSIS — I7774 Dissection of vertebral artery: Secondary | ICD-10-CM

## 2017-04-21 DIAGNOSIS — I5032 Chronic diastolic (congestive) heart failure: Secondary | ICD-10-CM | POA: Diagnosis not present

## 2017-04-21 DIAGNOSIS — Z9289 Personal history of other medical treatment: Secondary | ICD-10-CM

## 2017-04-21 DIAGNOSIS — I10 Essential (primary) hypertension: Secondary | ICD-10-CM

## 2017-04-21 DIAGNOSIS — I209 Angina pectoris, unspecified: Secondary | ICD-10-CM

## 2017-04-21 MED ORDER — FISH OIL 1000 MG PO CAPS: 2.0000 | ORAL_CAPSULE | Freq: Two times a day (BID) | ORAL | 3 refills | Status: DC

## 2017-04-21 NOTE — Telephone Encounter (Signed)
-----   Message from Adam R Jaffe, DO sent at 04/18/2017  7:13 AM EST ----- There does not appear to be any stroke correlating with the vertebral artery dissection.  No further recommendations at this time. 

## 2017-04-21 NOTE — Telephone Encounter (Signed)
Pt rtrnd call, advsd her of MRI results. Pt stated she has another teat in the morning, advsd her she would hear from us after Dr Everlena CooperJaffe has rcvd the results.

## 2017-04-21 NOTE — Progress Notes (Signed)
SUBJECTIVE: The patient presents for past due follow-up.  I have evaluated her on 08/19/16 and made a 6-week follow-up visit but she did not return.  She has a history of chronic diastolic heart failure, hypertension, hyperlipidemia, obstructive sleep apnea, morbid obesity, and angina.  She did not obtain the stress test I ordered.  She did not obtain a sleep study either.  Echocardiogram 08/12/16: Normal left ventricular systolic function, LVEF 60-65%, grade 2 diastolic dysfunction, mild LVH, mild left atrial enlargement.  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 CTA also demonstrated 60% stenosis in the right ICA and less than 50% stenosis in the left ICA.  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  She denies exertional chest pain and exertional dyspnea.  She denies orthopnea and leg edema.  ECG performed on 03/10/17 which I personally interpreted demonstrated sinus rhythm with nonspecific T wave abnormalities.   Review of Systems: As per "subjective", otherwise negative.  Allergies  Allergen Reactions  . Cefuroxime Axetil Other (See Comments)    REACTION: unspecified  . Fentanyl And Related Other (See Comments)    Durgesic Patch: unknown reaction  . Niaspan [Niacin Er] Other (See Comments)    Burning all over  . Sulfur Nausea And Vomiting  . Vasotec [Enalapril] Cough  . Welchol [Colesevelam Hcl] Other (See Comments)    Muscle aches and joint pains  . Lyrica [Pregabalin] Rash and Other (See Comments)    Blistering rash around same time as starting drug  . Penicillins Rash    Current Outpatient Medications  Medication Sig Dispense Refill  . aspirin EC 325 MG EC tablet Take 1 tablet (325 mg total) by mouth at bedtime. 30 tablet 0  . cetirizine (ZYRTEC) 10 MG tablet Take 1 tablet (10 mg total) by mouth daily. 90 tablet 1   . chlorhexidine (HIBICLENS) 4 % external liquid Apply topically daily as needed. 120 mL 0  . clopidogrel (PLAVIX) 75 MG tablet Take 1 tablet (75 mg total) daily by mouth. 90 tablet 1  . clotrimazole (LOTRIMIN) 1 % cream Apply 1 application topically 2 (two) times daily. 30 g 0  . clotrimazole-betamethasone (LOTRISONE) cream Apply 1 application topically 2 (two) times daily. 60 g 1  . fenofibrate 54 MG tablet Take 1 tablet (54 mg total) by mouth daily. 90 tablet 3  . furosemide (LASIX) 40 MG tablet Take 1 tablet twice a day (Patient taking differently: Take 40 mg by mouth 2 (two) times daily as needed for fluid. Take 1 tablet twice a day) 180 tablet 3  . gabapentin (NEURONTIN) 300 MG capsule Take 1 capsule (300 mg total) by mouth at bedtime. 30 capsule 3  . HYDROcodone-acetaminophen (NORCO) 5-325 MG tablet Take 1 tablet every 6 (six) hours as needed by mouth for moderate pain. 30 tablet 0  . insulin aspart (NOVOLOG) 100 UNIT/ML injection Inject 40 units before breakfast & lunch and 60 units at supper 120 mL 3  . insulin detemir (LEVEMIR) 100 UNIT/ML injection Inject 0.55 mLs (55 Units total) into the skin at bedtime. 60 mL 3  . ipratropium (ATROVENT) 0.02 % nebulizer solution Take 2.5 mLs (0.5 mg total) by nebulization every 4 (four) hours as needed for wheezing or shortness of breath. 75 mL 3  . ipratropium (ATROVENT) 0.06 % nasal spray Place 2 sprays into both nostrils 4 (four) times daily as  needed for rhinitis. 15 mL 3  . losartan (COZAAR) 100 MG tablet Take 1 tablet (100 mg total) by mouth daily. 90 tablet 3  . metoprolol tartrate (LOPRESSOR) 25 MG tablet 0.25 tablets PO twice a day  0  . montelukast (SINGULAIR) 10 MG tablet Take 1 tablet (10 mg total) by mouth at bedtime. 30 tablet 6  . nystatin (MYCOSTATIN/NYSTOP) powder Apply topically 4 (four) times daily. 180 g 2  . Omega-3 Fatty Acids (FISH OIL) 1000 MG CAPS Take 2 capsules by mouth 2 (two) times daily.    . pantoprazole (PROTONIX) 40 MG  tablet Take 1 tablet (40 mg total) daily by mouth. 90 tablet 3  . PARoxetine (PAXIL) 40 MG tablet Take 1 tablet (40 mg total) by mouth every morning. 90 tablet 3  . potassium chloride (K-DUR) 10 MEQ tablet Take 1 tablet (10 mEq total) by mouth 2 (two) times daily. (Patient taking differently: Take 10 mEq by mouth 2 (two) times daily as needed (when taking lasix). ) 180 tablet 3  . rosuvastatin (CRESTOR) 40 MG tablet Take 1 tablet (40 mg total) by mouth every evening. 90 tablet 3  . TRUEPLUS INSULIN SYRINGE 31G X 5/16" 1 ML MISC USE AS DIRECTED TO INJECT 100 each 2   No current facility-administered medications for this visit.     Past Medical History:  Diagnosis Date  . CHF (congestive heart failure) (HCC)   . Depression   . Diabetes mellitus   . Fibromyalgia   . GERD (gastroesophageal reflux disease)   . Hyperlipidemia   . Hypertension   . Stroke (HCC)   . Superficial thrombophlebitis    right leg    Past Surgical History:  Procedure Laterality Date  . CHOLECYSTECTOMY  1991  . HERNIA REPAIR  approx 1969  . NECK SURGERY  1998  . TONSILLECTOMY AND ADENOIDECTOMY  1966    Social History   Socioeconomic History  . Marital status: Married    Spouse name: Not on file  . Number of children: Not on file  . Years of education: Not on file  . Highest education level: Not on file  Social Needs  . Financial resource strain: Not on file  . Food insecurity - worry: Not on file  . Food insecurity - inability: Not on file  . Transportation needs - medical: Not on file  . Transportation needs - non-medical: Not on file  Occupational History  . Not on file  Tobacco Use  . Smoking status: Former Smoker    Types: Cigarettes  . Smokeless tobacco: Never Used  Substance and Sexual Activity  . Alcohol use: No  . Drug use: No  . Sexual activity: Not Currently  Other Topics Concern  . Not on file  Social History Narrative  . Not on file     Vitals:   04/21/17 0847  BP: 125/70    Pulse: 70  SpO2: 97%  Weight: 205 lb (93 kg)  Height: 4\' 11"  (1.499 m)    Wt Readings from Last 3 Encounters:  04/21/17 205 lb (93 kg)  04/08/17 201 lb 9.6 oz (91.4 kg)  04/07/17 200 lb (90.7 kg)     PHYSICAL EXAM General: NAD HEENT: Normal. Neck: No JVD, no thyromegaly. Lungs: Clear to auscultation bilaterally with normal respiratory effort. CV: Regular rate and rhythm, normal S1/S2, no S3/S4, no murmur. No pretibial or periankle edema.  No carotid bruit.   Abdomen: Soft, nontender, obese.  Neurologic: Alert and oriented.  Psych: Normal affect.  Skin: Normal. Musculoskeletal: No gross deformities.    ECG: Most recent ECG reviewed.   Labs: Lab Results  Component Value Date/Time   K 4.5 03/30/2017 04:50 PM   BUN 21 03/30/2017 04:50 PM   CREATININE 1.35 (H) 03/30/2017 04:50 PM   ALT 19 03/11/2017 08:01 AM   TSH 2.89 04/07/2016 10:52 AM   HGB 13.2 03/30/2017 04:50 PM     Lipids: Lab Results  Component Value Date/Time   LDLCALC 85 03/12/2017 07:39 AM   CHOL 203 (H) 03/12/2017 07:39 AM   TRIG 362 (H) 03/12/2017 07:39 AM   HDL 46 03/12/2017 07:39 AM       ASSESSMENT AND PLAN: 1.  Chronic diastolic heart failure: Euvolemic and stable.  She has grade 2 diastolic dysfunction.  Blood pressure is controlled.  Continue Lasix as prescribed.  2.  Chronic hypertension: Controlled.  No changes to therapy.  3.  Hyperlipidemia: Continue Crestor.  4.  Bilateral carotid artery stenosis: CTA results reviewed above.  Continue aspirin and statin.  She is also on Plavix due to vertebral artery dissection as per neurology.  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  As per their notes, aspirin will eventually be discontinued and she will continue on Plavix indefinitely.  6.  Angina pectoris: She currently denies chest pain.  She is on aspirin, beta-blocker, and statin therapy.  She has no known coronary artery disease.  She did  not obtain the previously ordered stress test in March 2018.  7.  Obstructive sleep apnea: She did not obtain a sleep study ordered in March 2018.  8.  History of chronic small cerebral infarcts: Continue aspirin, Plavix, and statin therapy as per neurology recommendations.    Disposition: Follow up 6 months  Time spent: 40 minutes, of which greater than 50% was spent reviewing symptoms, relevant blood tests and studies, and discussing management plan with the patient.    Prentice DockerSuresh Treyvion Durkee, M.D., F.A.C.C.

## 2017-04-21 NOTE — Telephone Encounter (Signed)
LM advising Pt to rtrn call for MRI results

## 2017-04-21 NOTE — Patient Instructions (Signed)
Your physician wants you to follow-up in: 6 months with Dr.Koneswaran You will receive a reminder letter in the mail two months in advance. If you don't receive a letter, please call our office to schedule the follow-up appointment.   Your physician recommends that you continue on your current medications as directed. Please refer to the Current Medication list given to you today.    If you need a refill on your cardiac medications before your next appointment, please call your pharmacy.     No lab work or tests ordered today.     Thank you for choosing [facility] !         

## 2017-04-22 ENCOUNTER — Telehealth: Payer: Self-pay

## 2017-04-22 ENCOUNTER — Ambulatory Visit
Admission: RE | Admit: 2017-04-22 | Discharge: 2017-04-22 | Disposition: A | Discharge status: Home / Self Care | Payer: Medicare Other | Source: Ambulatory Visit | Attending: Neurology | Admitting: Neurology

## 2017-04-22 DIAGNOSIS — I6523 Occlusion and stenosis of bilateral carotid arteries: Secondary | ICD-10-CM | POA: Diagnosis not present

## 2017-04-22 DIAGNOSIS — I7774 Dissection of vertebral artery: Secondary | ICD-10-CM

## 2017-04-22 MED ORDER — IOPAMIDOL (ISOVUE-370) INJECTION 76%
75.0000 mL | Freq: Once | INTRAVENOUS | Status: AC | PRN
  Administered 2017-04-22: 75 mL via INTRAVENOUS

## 2017-04-22 NOTE — Telephone Encounter (Signed)
Called and LM for Pt to rtrn my call 

## 2017-04-22 NOTE — Telephone Encounter (Signed)
-----   Message from Drema DallasAdam R Jaffe, DO sent at 04/22/2017 12:00 PM EST ----- The CTA no longer shows a vertebral artery dissection

## 2017-04-25 NOTE — Telephone Encounter (Signed)
LM for Pt to rtrn my call concerning angiogram results

## 2017-04-25 NOTE — Telephone Encounter (Signed)
Pt rtrnd my call, advsd her the CTA no longer showed dissection.

## 2017-05-01 ENCOUNTER — Other Ambulatory Visit: Payer: Self-pay | Admitting: Family Medicine

## 2017-05-05 ENCOUNTER — Other Ambulatory Visit: Payer: Self-pay | Admitting: *Deleted

## 2017-05-14 ENCOUNTER — Other Ambulatory Visit: Payer: Self-pay | Admitting: Family Medicine

## 2017-05-18 ENCOUNTER — Ambulatory Visit: Payer: Medicare Other | Admitting: Cardiovascular Disease

## 2017-05-19 ENCOUNTER — Other Ambulatory Visit: Payer: Self-pay

## 2017-05-19 ENCOUNTER — Ambulatory Visit (INDEPENDENT_AMBULATORY_CARE_PROVIDER_SITE_OTHER): Payer: Medicare Other | Admitting: Physician Assistant

## 2017-05-19 ENCOUNTER — Encounter: Payer: Self-pay | Admitting: Physician Assistant

## 2017-05-19 VITALS — BP 142/82 | HR 67 | Temp 98.1°F | Resp 16 | Ht 59.0 in | Wt 200.4 lb

## 2017-05-19 DIAGNOSIS — B9689 Other specified bacterial agents as the cause of diseases classified elsewhere: Secondary | ICD-10-CM

## 2017-05-19 DIAGNOSIS — J988 Other specified respiratory disorders: Secondary | ICD-10-CM | POA: Diagnosis not present

## 2017-05-19 MED ORDER — AZITHROMYCIN 250 MG PO TABS: ORAL_TABLET | ORAL | 0 refills | Status: DC

## 2017-05-19 MED ORDER — HYDROCODONE-HOMATROPINE 5-1.5 MG/5ML PO SYRP: 5.0000 mL | ORAL_SOLUTION | Freq: Four times a day (QID) | ORAL | 0 refills | Status: DC | PRN

## 2017-05-19 NOTE — Progress Notes (Signed)
Patient ID: Tina [name] MRN: [id number], DOB: 05-23-1951, 66 y.o. Date of Encounter: 05/19/2017, 11:00 AM    Chief Complaint:  Chief Complaint  Patient presents with  . Cough    x3days  . Dizziness    x3days  . bladder problems    urinating on self when coughing   . Chest Pain    x3days when coughing   . head stopped up    x3days     HPI: 66 y.o. year old female  She has had a "terrible cough".  Says that she has had congestion in her chest but also in her head.  Has had no documented fever.  However says that she has woken during the night and felt sweaty and had to get up and change her clothes".  Says that she is going to have surgery for incontinence at the first of the year after her new insurance kicks in.  Says that the incontinence has been bad with this cough.  Says that her ears also feel kind of congested.  Has had no significant sore throat.     Home Meds:   Outpatient Medications Prior to Visit  Medication Sig Dispense Refill  . clopidogrel (PLAVIX) 75 MG tablet Take 1 tablet (75 mg total) daily by mouth. 90 tablet 1  . clotrimazole (LOTRIMIN) 1 % cream Apply 1 application topically 2 (two) times daily. 30 g 0  . clotrimazole-betamethasone (LOTRISONE) cream Apply 1 application topically 2 (two) times daily. 60 g 1  . fenofibrate 54 MG tablet TAKE 1 TABLET BY MOUTH  DAILY 90 tablet 3  . furosemide (LASIX) 40 MG tablet Take 1 tablet twice a day (Patient taking differently: Take 40 mg by mouth 2 (two) times daily as needed for fluid. Take 1 tablet twice a day) 180 tablet 3  . gabapentin (NEURONTIN) 300 MG capsule Take 1 capsule (300 mg total) by mouth at bedtime. 30 capsule 3  . insulin aspart (NOVOLOG) 100 UNIT/ML injection Inject 40 units before breakfast & lunch and 60 units at supper 120 mL 3  . insulin detemir (LEVEMIR) 100 UNIT/ML injection Inject 0.55 mLs (55 Units total) into the skin at bedtime. 60 mL 3  . ipratropium (ATROVENT) 0.02 % nebulizer  solution Take 2.5 mLs (0.5 mg total) by nebulization every 4 (four) hours as needed for wheezing or shortness of breath. 75 mL 3  . ipratropium (ATROVENT) 0.06 % nasal spray Place 2 sprays into both nostrils 4 (four) times daily as needed for rhinitis. 15 mL 3  . losartan (COZAAR) 100 MG tablet TAKE 1 TABLET BY MOUTH  DAILY 90 tablet 3  . metoprolol tartrate (LOPRESSOR) 25 MG tablet 0.25 tablets PO twice a day  0  . metoprolol tartrate (LOPRESSOR) 25 MG tablet TAKE ONE-HALF TABLET BY  MOUTH TWO TIMES DAILY 90 tablet 3  . montelukast (SINGULAIR) 10 MG tablet TAKE 1 TABLET BY MOUTH AT  BEDTIME 30 tablet 6  . nystatin (MYCOSTATIN/NYSTOP) powder Apply topically 4 (four) times daily. 180 g 2  . Omega-3 Fatty Acids (FISH OIL) 1000 MG CAPS Take 2 capsules (2,000 mg total) by mouth 2 (two) times daily. 360 capsule 3  . pantoprazole (PROTONIX) 40 MG tablet Take 1 tablet (40 mg total) daily by mouth. 90 tablet 3  . PARoxetine (PAXIL) 40 MG tablet TAKE 1 TABLET BY MOUTH  EVERY MORNING 90 tablet 3  . potassium chloride (K-DUR) 10 MEQ tablet Take 1 tablet (10 mEq total) by mouth 2 (  two) times daily. (Patient taking differently: Take 10 mEq by mouth 2 (two) times daily as needed (when taking lasix). ) 180 tablet 3  . rosuvastatin (CRESTOR) 40 MG tablet TAKE 1 TABLET BY MOUTH  EVERY EVENING 90 tablet 3  . TRUEPLUS INSULIN SYRINGE 31G X 5/16" 1 ML MISC USE AS DIRECTED TO INJECT 100 each 2  . aspirin EC 325 MG EC tablet Take 1 tablet (325 mg total) by mouth at bedtime. 30 tablet 0  . cetirizine (ZYRTEC) 10 MG tablet Take 1 tablet (10 mg total) by mouth daily. (Patient not taking: Reported on 05/19/2017) 90 tablet 1  . chlorhexidine (HIBICLENS) 4 % external liquid Apply topically daily as needed. 120 mL 0  . HYDROcodone-acetaminophen (NORCO) 5-325 MG tablet Take 1 tablet every 6 (six) hours as needed by mouth for moderate pain. (Patient not taking: Reported on 05/19/2017) 30 tablet 0   No facility-administered  medications prior to visit.     Allergies:  Allergies  Allergen Reactions  . Cefuroxime Axetil Other (See Comments)    REACTION: unspecified  . Fentanyl And Related Other (See Comments)    Durgesic Patch: unknown reaction  . Niaspan [Niacin Er] Other (See Comments)    Burning all over  . Sulfur Nausea And Vomiting  . Vasotec [Enalapril] Cough  . Welchol [Colesevelam Hcl] Other (See Comments)    Muscle aches and joint pains  . Lyrica [Pregabalin] Rash and Other (See Comments)    Blistering rash around same time as starting drug  . Penicillins Rash      Review of Systems: See HPI for pertinent ROS. All other ROS negative.    Physical Exam: Blood pressure (!) 142/82, pulse 67, temperature 98.1 F (36.7 C), temperature source Oral, resp. rate 16, height 4\' 11"  (1.499 m), weight 90.9 kg (200 lb 6.4 oz), SpO2 96 %., Body mass index is 40.48 kg/m. General:  Obese WF. Appears in no acute distress. HEENT: Normocephalic, atraumatic, eyes without discharge, sclera non-icteric, nares are without discharge. Bilateral auditory canals clear, TM's are without perforation, pearly grey and translucent with reflective cone of light bilaterally. Oral cavity moist, posterior pharynx without exudate, erythema, peritonsillar abscess.  Neck: Supple. No thyromegaly. No lymphadenopathy. Lungs: Clear bilaterally to auscultation without wheezes, rales, or rhonchi. Breathing is unlabored. Heart: Regular rhythm. No murmurs, rubs, or gallops. Msk:  Strength and tone normal for age. Extremities/Skin: Warm and dry.  Neuro: Alert and oriented X 3. Moves all extremities spontaneously. Gait is normal. CNII-XII grossly in tact. Psych:  Responds to questions appropriately with a normal affect.     ASSESSMENT AND PLAN:  66 y.o. year old female with  1. Bacterial respiratory infection She has history of allergy and intolerance to multiple medications but can take the azithromycin. She is to take azithromycin as  directed.  Follow-up if symptoms do not resolve within 1 week after completion of antibiotic. Also states that she has been given prescription cough syrup in the past to use for cough suppressant and is requesting this.  To use the Hycodan as directed. - azithromycin (ZITHROMAX) 250 MG tablet; Day 1: Take 2 daily. Days 2 -5: Take 1 daily.  Dispense: 6 tablet; Refill: 0 - HYDROcodone-homatropine (HYCODAN) 5-1.5 MG/5ML syrup; Take 5 mLs by mouth every 6 (six) hours as needed for cough.  Dispense: 120 mL; Refill: 0   Signed, [address]Mary Beth Fleming IslandDixon, [place]PA, [facility] 05/19/2017 11:00 AM

## 2017-05-23 ENCOUNTER — Telehealth: Payer: Self-pay | Admitting: *Deleted

## 2017-05-23 MED ORDER — GUAIFENESIN ER 600 MG PO TB12: 600.0000 mg | ORAL_TABLET | Freq: Two times a day (BID) | ORAL | 3 refills | Status: DC

## 2017-05-23 MED ORDER — ALBUTEROL SULFATE HFA 108 (90 BASE) MCG/ACT IN AERS: 2.0000 | INHALATION_SPRAY | Freq: Four times a day (QID) | RESPIRATORY_TRACT | 0 refills | Status: DC | PRN

## 2017-05-23 NOTE — Telephone Encounter (Signed)
Give albuterol inhaler Take mucinex twice a day  If her SOB gets worse go to ER

## 2017-05-23 NOTE — Telephone Encounter (Signed)
Call placed to patient and patient made aware.   Prescription sent to pharmacy.  

## 2017-05-23 NOTE — Telephone Encounter (Signed)
Received call from patient.   Reports that she has been sick since 05/16/2017. Reports that she was seen in office by PA and given Hycodan and Z Pack for URI.   Patient completed above medications with no relief. States that she continues to have productive cough, wheezing, SOB on exertion, and is very hoarse. Denies fever.   Patient did schedule an appointment to see PCP on 05/25/2017. Requested MD to advise if there is anything else she can do at this time.

## 2017-05-24 ENCOUNTER — Encounter (HOSPITAL_COMMUNITY): Payer: Self-pay | Admitting: *Deleted

## 2017-05-24 ENCOUNTER — Other Ambulatory Visit: Payer: Self-pay

## 2017-05-24 ENCOUNTER — Emergency Department (HOSPITAL_COMMUNITY): Payer: Medicare HMO

## 2017-05-24 ENCOUNTER — Inpatient Hospital Stay (HOSPITAL_COMMUNITY)
Admission: EM | Admit: 2017-05-24 | Discharge: 2017-05-26 | DRG: 309 | Disposition: A | Discharge status: Home / Self Care | Payer: Medicare HMO | Attending: Family Medicine | Admitting: Family Medicine

## 2017-05-24 DIAGNOSIS — L270 Generalized skin eruption due to drugs and medicaments taken internally: Secondary | ICD-10-CM | POA: Diagnosis not present

## 2017-05-24 DIAGNOSIS — Z9049 Acquired absence of other specified parts of digestive tract: Secondary | ICD-10-CM

## 2017-05-24 DIAGNOSIS — J302 Other seasonal allergic rhinitis: Secondary | ICD-10-CM | POA: Diagnosis not present

## 2017-05-24 DIAGNOSIS — I959 Hypotension, unspecified: Secondary | ICD-10-CM | POA: Diagnosis present

## 2017-05-24 DIAGNOSIS — I4891 Unspecified atrial fibrillation: Secondary | ICD-10-CM | POA: Diagnosis not present

## 2017-05-24 DIAGNOSIS — I10 Essential (primary) hypertension: Secondary | ICD-10-CM | POA: Diagnosis not present

## 2017-05-24 DIAGNOSIS — G4733 Obstructive sleep apnea (adult) (pediatric): Secondary | ICD-10-CM | POA: Diagnosis not present

## 2017-05-24 DIAGNOSIS — Z7902 Long term (current) use of antithrombotics/antiplatelets: Secondary | ICD-10-CM | POA: Diagnosis not present

## 2017-05-24 DIAGNOSIS — Z7982 Long term (current) use of aspirin: Secondary | ICD-10-CM | POA: Diagnosis not present

## 2017-05-24 DIAGNOSIS — M797 Fibromyalgia: Secondary | ICD-10-CM | POA: Diagnosis present

## 2017-05-24 DIAGNOSIS — F32A Depression, unspecified: Secondary | ICD-10-CM | POA: Diagnosis present

## 2017-05-24 DIAGNOSIS — R0602 Shortness of breath: Secondary | ICD-10-CM | POA: Diagnosis not present

## 2017-05-24 DIAGNOSIS — IMO0002 Reserved for concepts with insufficient information to code with codable children: Secondary | ICD-10-CM | POA: Diagnosis present

## 2017-05-24 DIAGNOSIS — E785 Hyperlipidemia, unspecified: Secondary | ICD-10-CM | POA: Diagnosis present

## 2017-05-24 DIAGNOSIS — E1142 Type 2 diabetes mellitus with diabetic polyneuropathy: Secondary | ICD-10-CM | POA: Diagnosis present

## 2017-05-24 DIAGNOSIS — Z87891 Personal history of nicotine dependence: Secondary | ICD-10-CM | POA: Diagnosis not present

## 2017-05-24 DIAGNOSIS — J441 Chronic obstructive pulmonary disease with (acute) exacerbation: Secondary | ICD-10-CM | POA: Diagnosis not present

## 2017-05-24 DIAGNOSIS — F329 Major depressive disorder, single episode, unspecified: Secondary | ICD-10-CM | POA: Diagnosis present

## 2017-05-24 DIAGNOSIS — R0600 Dyspnea, unspecified: Secondary | ICD-10-CM | POA: Diagnosis present

## 2017-05-24 DIAGNOSIS — R32 Unspecified urinary incontinence: Secondary | ICD-10-CM | POA: Diagnosis present

## 2017-05-24 DIAGNOSIS — I5032 Chronic diastolic (congestive) heart failure: Secondary | ICD-10-CM | POA: Diagnosis not present

## 2017-05-24 DIAGNOSIS — Z8673 Personal history of transient ischemic attack (TIA), and cerebral infarction without residual deficits: Secondary | ICD-10-CM | POA: Diagnosis not present

## 2017-05-24 DIAGNOSIS — Z79899 Other long term (current) drug therapy: Secondary | ICD-10-CM

## 2017-05-24 DIAGNOSIS — Z794 Long term (current) use of insulin: Secondary | ICD-10-CM

## 2017-05-24 DIAGNOSIS — I482 Chronic atrial fibrillation: Secondary | ICD-10-CM | POA: Diagnosis not present

## 2017-05-24 DIAGNOSIS — N3946 Mixed incontinence: Secondary | ICD-10-CM | POA: Diagnosis not present

## 2017-05-24 DIAGNOSIS — I110 Hypertensive heart disease with heart failure: Secondary | ICD-10-CM | POA: Diagnosis present

## 2017-05-24 DIAGNOSIS — R69 Illness, unspecified: Secondary | ICD-10-CM | POA: Diagnosis not present

## 2017-05-24 DIAGNOSIS — R05 Cough: Secondary | ICD-10-CM | POA: Diagnosis not present

## 2017-05-24 DIAGNOSIS — E1165 Type 2 diabetes mellitus with hyperglycemia: Secondary | ICD-10-CM | POA: Diagnosis present

## 2017-05-24 DIAGNOSIS — E876 Hypokalemia: Secondary | ICD-10-CM | POA: Diagnosis not present

## 2017-05-24 DIAGNOSIS — R079 Chest pain, unspecified: Secondary | ICD-10-CM | POA: Diagnosis not present

## 2017-05-24 DIAGNOSIS — K219 Gastro-esophageal reflux disease without esophagitis: Secondary | ICD-10-CM | POA: Diagnosis present

## 2017-05-24 LAB — TROPONIN I: Troponin I: <0.03 ng/mL (ref <0.03)

## 2017-05-24 LAB — CBC WITH DIFFERENTIAL/PLATELET
BASOS ABS: 0 10*3/uL (ref 0.0–0.1)
Basophils Relative: 0 %
EOS PCT: 1 %
Eosinophils Absolute: 0.1 10*3/uL (ref 0.0–0.7)
HEMATOCRIT: 42.1 % (ref 36.0–46.0)
HEMOGLOBIN: 14 g/dL (ref 12.0–15.0)
LYMPHS ABS: 3.6 10*3/uL (ref 0.7–4.0)
LYMPHS PCT: 40 %
MCH: 31.9 pg (ref 26.0–34.0)
MCHC: 33.3 g/dL (ref 30.0–36.0)
MCV: 95.9 fL (ref 78.0–100.0)
Monocytes Absolute: 0.7 10*3/uL (ref 0.1–1.0)
Monocytes Relative: 8 %
NEUTROS PCT: 51 %
Neutro Abs: 4.6 10*3/uL (ref 1.7–7.7)
Platelets: 284 10*3/uL (ref 150–400)
RBC: 4.39 MIL/uL (ref 3.87–5.11)
RDW: 13.4 % (ref 11.5–15.5)
WBC: 9 10*3/uL (ref 4.0–10.5)

## 2017-05-24 LAB — BRAIN NATRIURETIC PEPTIDE: B Natriuretic Peptide: 184 pg/mL — ABNORMAL HIGH (ref 0.0–100.0)

## 2017-05-24 LAB — COMPREHENSIVE METABOLIC PANEL
ALBUMIN: 3.9 g/dL (ref 3.5–5.0)
ALT: 20 U/L (ref 14–54)
AST: 22 U/L (ref 15–41)
Alkaline Phosphatase: 68 U/L (ref 38–126)
Anion gap: 13 (ref 5–15)
BILIRUBIN TOTAL: 0.6 mg/dL (ref 0.3–1.2)
BUN: 18 mg/dL (ref 6–20)
CO2: 25 mmol/L (ref 22–32)
CREATININE: 1.06 mg/dL — AB (ref 0.44–1.00)
Calcium: 9.3 mg/dL (ref 8.9–10.3)
Chloride: 105 mmol/L (ref 101–111)
GFR calc Af Amer: >60 mL/min (ref >60)
GFR calc non Af Amer: 54 mL/min — ABNORMAL LOW (ref >60)
GLUCOSE: 84 mg/dL (ref 65–99)
Potassium: 3.3 mmol/L — ABNORMAL LOW (ref 3.5–5.1)
Sodium: 143 mmol/L (ref 135–145)
TOTAL PROTEIN: 8.2 g/dL — AB (ref 6.5–8.1)

## 2017-05-24 MED ORDER — MAGNESIUM SULFATE 2 GM/50ML IV SOLN
2.0000 g | Freq: Once | INTRAVENOUS | Status: AC
  Administered 2017-05-24: 2 g via INTRAVENOUS
  Filled 2017-05-24: qty 50

## 2017-05-24 MED ORDER — GUAIFENESIN ER 600 MG PO TB12
600.0000 mg | ORAL_TABLET | Freq: Two times a day (BID) | ORAL | Status: DC
  Administered 2017-05-25 – 2017-05-26 (×4): 600 mg via ORAL
  Filled 2017-05-24 (×4): qty 1

## 2017-05-24 MED ORDER — HYDROCODONE-HOMATROPINE 5-1.5 MG/5ML PO SYRP
5.0000 mL | ORAL_SOLUTION | Freq: Four times a day (QID) | ORAL | Status: DC | PRN
  Administered 2017-05-25 – 2017-05-26 (×5): 5 mL via ORAL
  Filled 2017-05-24 (×5): qty 5

## 2017-05-24 MED ORDER — DILTIAZEM HCL 25 MG/5ML IV SOLN
10.0000 mg | Freq: Once | INTRAVENOUS | Status: AC
  Administered 2017-05-24: 10 mg via INTRAVENOUS
  Filled 2017-05-24: qty 5

## 2017-05-24 MED ORDER — PANTOPRAZOLE SODIUM 40 MG PO TBEC
40.0000 mg | DELAYED_RELEASE_TABLET | Freq: Every day | ORAL | Status: DC
  Administered 2017-05-25 – 2017-05-26 (×2): 40 mg via ORAL
  Filled 2017-05-24 (×2): qty 1

## 2017-05-24 MED ORDER — LEVALBUTEROL HCL 1.25 MG/0.5ML IN NEBU
INHALATION_SOLUTION | RESPIRATORY_TRACT | Status: AC
  Filled 2017-05-24: qty 0.5

## 2017-05-24 MED ORDER — POTASSIUM CHLORIDE IN NACL 40-0.9 MEQ/L-% IV SOLN
INTRAVENOUS | Status: AC
  Administered 2017-05-25: 88 mL/h via INTRAVENOUS
  Filled 2017-05-24 (×2): qty 1000

## 2017-05-24 MED ORDER — SODIUM CHLORIDE 0.9 % IV SOLN
INTRAVENOUS | Status: DC
  Administered 2017-05-25 (×2): via INTRAVENOUS

## 2017-05-24 MED ORDER — INSULIN DETEMIR 100 UNIT/ML ~~LOC~~ SOLN
55.0000 [IU] | Freq: Every day | SUBCUTANEOUS | Status: DC
  Administered 2017-05-25 (×2): 55 [IU] via SUBCUTANEOUS
  Filled 2017-05-24 (×2): qty 0.55

## 2017-05-24 MED ORDER — LORATADINE 10 MG PO TABS
10.0000 mg | ORAL_TABLET | Freq: Every day | ORAL | Status: DC
  Administered 2017-05-25 – 2017-05-26 (×2): 10 mg via ORAL
  Filled 2017-05-24 (×2): qty 1

## 2017-05-24 MED ORDER — IPRATROPIUM BROMIDE 0.02 % IN SOLN
RESPIRATORY_TRACT | Status: AC
  Filled 2017-05-24: qty 2.5

## 2017-05-24 MED ORDER — METOPROLOL TARTRATE 5 MG/5ML IV SOLN: 2.5000 mg | INTRAVENOUS | Status: DC | PRN

## 2017-05-24 MED ORDER — METHYLPREDNISOLONE SODIUM SUCC 125 MG IJ SOLR
125.0000 mg | Freq: Once | INTRAMUSCULAR | Status: AC
  Administered 2017-05-24: 125 mg via INTRAVENOUS
  Filled 2017-05-24: qty 2

## 2017-05-24 MED ORDER — ROSUVASTATIN CALCIUM 20 MG PO TABS
40.0000 mg | ORAL_TABLET | Freq: Every evening | ORAL | Status: DC
  Administered 2017-05-25: 40 mg via ORAL
  Filled 2017-05-24: qty 2

## 2017-05-24 MED ORDER — ALBUTEROL SULFATE (2.5 MG/3ML) 0.083% IN NEBU
5.0000 mg | INHALATION_SOLUTION | Freq: Once | RESPIRATORY_TRACT | Status: DC
  Filled 2017-05-24: qty 6

## 2017-05-24 MED ORDER — POTASSIUM CHLORIDE CRYS ER 20 MEQ PO TBCR
40.0000 meq | EXTENDED_RELEASE_TABLET | Freq: Once | ORAL | Status: AC
  Administered 2017-05-24: 40 meq via ORAL
  Filled 2017-05-24: qty 2

## 2017-05-24 MED ORDER — ONDANSETRON HCL 4 MG PO TABS: 4.0000 mg | ORAL_TABLET | Freq: Four times a day (QID) | ORAL | Status: DC | PRN

## 2017-05-24 MED ORDER — METOPROLOL TARTRATE 25 MG PO TABS
12.5000 mg | ORAL_TABLET | Freq: Two times a day (BID) | ORAL | Status: DC
  Administered 2017-05-25 (×2): 12.5 mg via ORAL
  Filled 2017-05-24 (×2): qty 1

## 2017-05-24 MED ORDER — LEVALBUTEROL HCL 1.25 MG/0.5ML IN NEBU
1.2500 mg | INHALATION_SOLUTION | Freq: Once | RESPIRATORY_TRACT | Status: AC
  Administered 2017-05-24: 1.25 mg via RESPIRATORY_TRACT

## 2017-05-24 MED ORDER — ENOXAPARIN SODIUM 100 MG/ML ~~LOC~~ SOLN
1.0000 mg/kg | Freq: Once | SUBCUTANEOUS | Status: AC
  Administered 2017-05-24: 90 mg via SUBCUTANEOUS
  Filled 2017-05-24: qty 1

## 2017-05-24 MED ORDER — SODIUM CHLORIDE 0.9 % IV BOLUS (SEPSIS)
1000.0000 mL | Freq: Once | INTRAVENOUS | Status: AC
  Administered 2017-05-24: 1000 mL via INTRAVENOUS

## 2017-05-24 MED ORDER — PAROXETINE HCL 20 MG PO TABS
40.0000 mg | ORAL_TABLET | Freq: Every morning | ORAL | Status: DC
  Administered 2017-05-25 – 2017-05-26 (×2): 40 mg via ORAL
  Filled 2017-05-24 (×2): qty 2

## 2017-05-24 MED ORDER — LEVALBUTEROL HCL 0.63 MG/3ML IN NEBU
0.6300 mg | INHALATION_SOLUTION | Freq: Four times a day (QID) | RESPIRATORY_TRACT | Status: DC
  Administered 2017-05-25 (×3): 0.63 mg via RESPIRATORY_TRACT
  Filled 2017-05-24 (×4): qty 3

## 2017-05-24 MED ORDER — MONTELUKAST SODIUM 10 MG PO TABS
10.0000 mg | ORAL_TABLET | Freq: Every day | ORAL | Status: DC
  Administered 2017-05-25: 10 mg via ORAL
  Filled 2017-05-24: qty 1

## 2017-05-24 MED ORDER — LOSARTAN POTASSIUM 50 MG PO TABS
100.0000 mg | ORAL_TABLET | Freq: Every day | ORAL | Status: DC
  Administered 2017-05-25: 100 mg via ORAL
  Filled 2017-05-24: qty 2

## 2017-05-24 MED ORDER — KETOROLAC TROMETHAMINE 30 MG/ML IJ SOLN
30.0000 mg | Freq: Once | INTRAMUSCULAR | Status: AC
  Administered 2017-05-24: 30 mg via INTRAVENOUS
  Filled 2017-05-24: qty 1

## 2017-05-24 MED ORDER — LEVALBUTEROL HCL 0.63 MG/3ML IN NEBU: 0.6300 mg | INHALATION_SOLUTION | Freq: Four times a day (QID) | RESPIRATORY_TRACT | Status: DC | PRN

## 2017-05-24 MED ORDER — IPRATROPIUM BROMIDE 0.02 % IN SOLN
0.5000 mg | Freq: Four times a day (QID) | RESPIRATORY_TRACT | Status: DC
  Administered 2017-05-25 (×4): 0.5 mg via RESPIRATORY_TRACT
  Filled 2017-05-24 (×4): qty 2.5

## 2017-05-24 MED ORDER — GABAPENTIN 300 MG PO CAPS
300.0000 mg | ORAL_CAPSULE | Freq: Every day | ORAL | Status: DC
  Administered 2017-05-25 (×2): 300 mg via ORAL
  Filled 2017-05-24 (×2): qty 1

## 2017-05-24 MED ORDER — ONDANSETRON HCL 4 MG/2ML IJ SOLN: 4.0000 mg | Freq: Four times a day (QID) | INTRAMUSCULAR | Status: DC | PRN

## 2017-05-24 MED ORDER — INSULIN ASPART 100 UNIT/ML ~~LOC~~ SOLN
40.0000 [IU] | Freq: Two times a day (BID) | SUBCUTANEOUS | Status: DC
Start: 2017-05-25 — End: 2017-05-25

## 2017-05-24 NOTE — ED Provider Notes (Signed)
[facility] EMERGENCY DEPARTMENT Provider Note   CSN: [id number] Arrival date & time: 05/24/17  2010     History   Chief Complaint Chief Complaint  Patient presents with  . Shortness of Breath    HPI Tina [name] is a 67 y.o. female.  Patient complains of shortness of breath and cough.  This is been going on for a week but getting worse   The history is provided by the patient.  Shortness of Breath  This is a new problem. The problem occurs continuously.The current episode started more than 1 week ago. The problem has not changed since onset.Associated symptoms include cough. Pertinent negatives include no fever, no headaches, no chest pain, no abdominal pain and no rash. Precipitated by: Unknown. Risk factors include smoking. She has tried nothing for the symptoms.    Past Medical History:  Diagnosis Date  . CHF (congestive heart failure) (HCC)   . Depression   . Diabetes mellitus   . Fibromyalgia   . GERD (gastroesophageal reflux disease)   . Hyperlipidemia   . Hypertension   . Stroke (HCC)   . Superficial thrombophlebitis    right leg    Patient Active Problem List   Diagnosis Date Noted  . 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 Seasonal allergies 09/22/2015  . 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 Yeast infection 11/12/2014  . Skin infection 11/12/2014  . Carotid artery stenosis 08/09/2014  . Bladder prolapse, female, acquired 07/15/2014  . Urinary incontinence 07/15/2014  . Bradycardia 04/03/2014  . Bunion of great toe 05/11/2013  . Anxiety state, unspecified 09/17/2012  . Pain in joint, lower leg 09/17/2012  . Obstructive sleep apnea 07/21/2012  . SOB (shortness of breath) 12/06/2011  . Leg edema 12/06/2011  . CHF (congestive heart failure) (HCC) 12/06/2011  . GERD (gastroesophageal reflux disease)   . Vertigo 12/01/2011  . Fall 12/01/2011  . Depression 11/08/2011  . Insomnia 11/08/2011  . Obesity 11/08/2011  . Diabetes  mellitus type II, uncontrolled (HCC) 03/03/2007  . Hyperlipidemia 03/03/2007  . Essential hypertension 03/03/2007  . Fibromyalgia 03/03/2007    Past Surgical History:  Procedure Laterality Date  . CHOLECYSTECTOMY  1991  . HERNIA REPAIR  approx 1969  . NECK SURGERY  1998  . TONSILLECTOMY AND ADENOIDECTOMY  1966    OB History    No data available       Home Medications    Prior to Admission medications   Medication Sig Start Date End Date Taking? Authorizing Provider  albuterol (PROVENTIL HFA;VENTOLIN HFA) 108 (90 Base) MCG/ACT inhaler Inhale 2 puffs into the lungs every 6 (six) hours as needed for wheezing or shortness of breath. 05/23/17   Salley Scarlet, MD  aspirin EC 325 MG EC tablet Take 1 tablet (325 mg total) by mouth at bedtime. 03/12/17   Filbert Schilder, MD  azithromycin (ZITHROMAX) 250 MG tablet Day 1: Take 2 daily. Days 2 -5: Take 1 daily. 05/19/17   Dorena Bodo, PA-C  cetirizine (ZYRTEC) 10 MG tablet Take 1 tablet (10 mg total) by mouth daily. Patient not taking: Reported on 05/19/2017 06/03/16   Salley Scarlet, MD  chlorhexidine (HIBICLENS) 4 % external liquid Apply topically daily as needed. 04/07/16   Salley Scarlet, MD  clopidogrel (PLAVIX) 75 MG tablet Take 1 tablet (75 mg total) daily by mouth. 03/30/17   Argonne, Velna Hatchet, MD  clotrimazole (LOTRIMIN) 1 % cream Apply 1 application topically 2 (two) times daily.  08/11/15   Salley Scarlet, MD  clotrimazole-betamethasone (LOTRISONE) cream Apply 1 application topically 2 (two) times daily. 12/15/16   Salley Scarlet, MD  fenofibrate 54 MG tablet TAKE 1 TABLET BY MOUTH  DAILY 05/04/17   Salley Scarlet, MD  furosemide (LASIX) 40 MG tablet Take 1 tablet twice a day Patient taking differently: Take 40 mg by mouth 2 (two) times daily as needed for fluid. Take 1 tablet twice a day 07/07/16   Salley Scarlet, MD  gabapentin (NEURONTIN) 300 MG capsule Take 1 capsule (300 mg total) by mouth at bedtime.  12/15/16   Salley Scarlet, MD  guaiFENesin (MUCINEX) 600 MG 12 hr tablet Take 1 tablet (600 mg total) by mouth 2 (two) times daily. 05/23/17   [place]o, Velna Hatchet, MD  HYDROcodone-acetaminophen (NORCO) 5-325 MG tablet Take 1 tablet every 6 (six) hours as needed by mouth for moderate pain. Patient not taking: Reported on 05/19/2017 03/30/17   Salley Scarlet, MD  HYDROcodone-homatropine [facility]) 5-1.5 MG/5ML syrup Take 5 mLs by mouth every 6 (six) hours as needed for cough. 05/19/17   Dorena Bodo, PA-C  insulin aspart (NOVOLOG) 100 UNIT/ML injection Inject 40 units before breakfast & lunch and 60 units at supper 12/17/16   Romero Belling, MD  insulin detemir (LEVEMIR) 100 UNIT/ML injection Inject 0.55 mLs (55 Units total) into the skin at bedtime. 04/12/16   Romero Belling, MD  ipratropium (ATROVENT) 0.02 % nebulizer solution Take 2.5 mLs (0.5 mg total) by nebulization every 4 (four) hours as needed for wheezing or shortness of breath. 05/14/15   Allayne Butcher B, PA-C  ipratropium (ATROVENT) 0.06 % nasal spray Place 2 sprays into both nostrils 4 (four) times daily as needed for rhinitis. 10/05/16   Salley Scarlet, MD  losartan (COZAAR) 100 MG tablet TAKE 1 TABLET BY MOUTH  DAILY 05/04/17   Salley Scarlet, MD  metoprolol tartrate (LOPRESSOR) 25 MG tablet 0.25 tablets PO twice a day 03/12/17   Filbert Schilder, MD  metoprolol tartrate (LOPRESSOR) 25 MG tablet TAKE ONE-HALF TABLET BY  MOUTH TWO TIMES DAILY 05/04/17   Salley Scarlet, MD  montelukast (SINGULAIR) 10 MG tablet TAKE 1 TABLET BY MOUTH AT  BEDTIME 05/16/17   Graball, Velna Hatchet, MD  nystatin (MYCOSTATIN/NYSTOP) powder Apply topically 4 (four) times daily. 12/15/16   Lamboglia, Velna Hatchet, MD  Omega-3 Fatty Acids (FISH OIL) 1000 MG CAPS Take 2 capsules (2,000 mg total) by mouth 2 (two) times daily. 04/21/17   Salley Scarlet, MD  pantoprazole (PROTONIX) 40 MG tablet Take 1 tablet (40 mg total) daily by mouth. 04/08/17   Kinston, Velna Hatchet,  MD  PARoxetine (PAXIL) 40 MG tablet TAKE 1 TABLET BY MOUTH  EVERY MORNING 05/04/17   Taney, Velna Hatchet, MD  potassium chloride (K-DUR) 10 MEQ tablet Take 1 tablet (10 mEq total) by mouth 2 (two) times daily. Patient taking differently: Take 10 mEq by mouth 2 (two) times daily as needed (when taking lasix).  06/03/16   Salley Scarlet, MD  rosuvastatin (CRESTOR) 40 MG tablet TAKE 1 TABLET BY MOUTH  EVERY EVENING 05/04/17   Salley Scarlet, MD  TRUEPLUS INSULIN SYRINGE 31G X 5/16" 1 ML MISC USE AS DIRECTED TO INJECT 12/23/14   Salley Scarlet, MD    Family History Family History  Problem Relation Age of Onset  . Hypertension Mother   . Hyperlipidemia Mother   . Heart disease Mother   . Stroke Sister   .  Hypertension Sister   . Hyperlipidemia Sister   . Heart disease Sister   . Diabetes Maternal Grandmother   . Diabetes Daughter     Social History Social History   Tobacco Use  . Smoking status: Former Smoker    Types: Cigarettes  . Smokeless tobacco: Never Used  Substance Use Topics  . Alcohol use: No  . Drug use: No     Allergies   Cefuroxime axetil; Fentanyl and related; Niaspan [niacin er]; Sulfur; Vasotec [enalapril]; Welchol [colesevelam hcl]; Lyrica [pregabalin]; and Penicillins   Review of Systems Review of Systems  Constitutional: Negative for appetite change, fatigue and fever.  HENT: Negative for congestion, ear discharge and sinus pressure.   Eyes: Negative for discharge.  Respiratory: Positive for cough and shortness of breath.   Cardiovascular: Negative for chest pain.  Gastrointestinal: Negative for abdominal pain and diarrhea.  Genitourinary: Negative for frequency and hematuria.  Musculoskeletal: Negative for back pain.  Skin: Negative for rash.  Neurological: Negative for seizures and headaches.  Psychiatric/Behavioral: Negative for hallucinations.     Physical Exam Updated Vital Signs BP 126/88   Pulse 96   Temp 97.7 F (36.5 C) (Oral)    Resp 20   Ht 4\' 11"  (1.499 m)   Wt 90.7 kg (200 lb)   SpO2 99%   BMI 40.40 kg/m   Physical Exam  Constitutional: She is oriented to person, place, and time. She appears well-developed.  HENT:  Head: Normocephalic.  Eyes: Conjunctivae and EOM are normal. No scleral icterus.  Neck: Neck supple. No thyromegaly present.  Cardiovascular: Exam reveals no gallop and no friction rub.  No murmur heard. Irregular rapid rate  Pulmonary/Chest: No stridor. She has wheezes. She has no rales. She exhibits no tenderness.  Abdominal: She exhibits no distension. There is no tenderness. There is no rebound.  Musculoskeletal: Normal range of motion. She exhibits no edema.  Lymphadenopathy:    She has no cervical adenopathy.  Neurological: She is oriented to person, place, and time. She exhibits normal muscle tone. Coordination normal.  Skin: No rash noted. No erythema.  Psychiatric: She has a normal mood and affect. Her behavior is normal.     ED Treatments / Results  Labs (all labs ordered are listed, but only abnormal results are displayed) Labs Reviewed  COMPREHENSIVE METABOLIC PANEL - Abnormal; Notable for the following components:      Result Value   Potassium 3.3 (*)    Creatinine, Ser 1.06 (*)    Total Protein 8.2 (*)    GFR calc non Af Amer 54 (*)    All other components within normal limits  BRAIN NATRIURETIC PEPTIDE - Abnormal; Notable for the following components:   B Natriuretic Peptide 184.0 (*)    All other components within normal limits  CBC WITH DIFFERENTIAL/PLATELET  TROPONIN I  MAGNESIUM  TROPONIN I  TROPONIN I    EKG  EKG Interpretation  Date/Time:  Tuesday May 24 2017 20:24:19 EST Ventricular Rate:  124 PR Interval:    QRS Duration: 97 QT Interval:  344 QTC Calculation: 484 R Axis:   67 Text Interpretation:  Atrial fibrillation Ventricular premature complex Low voltage, precordial leads Repol abnrm suggests ischemia, diffuse leads Baseline wander in  lead(s) I II III aVL aVF V1 Confirmed by Bethann Berkshire [phone]) on 05/24/2017 8:30:42 PM       Radiology Dg Chest Portable 1 View  Result Date: 05/24/2017 CLINICAL DATA:  Short of breath for 6 days. CHF. Chest pain with  coughing. EXAM: PORTABLE CHEST 1 VIEW COMPARISON:  10/28/2016 FINDINGS: Mildly degraded exam due to AP portable technique and patient body habitus. Lower cervical spine fixation. Midline trachea. Normal heart size for level of inspiration. No pleural effusion or pneumothorax. Low lung volumes with resultant pulmonary interstitial prominence. No lobar consolidation. No congestive failure. IMPRESSION: Low lung volumes, without acute disease. Electronically Signed   By: Jeronimo GreavesKyle  Talbot M.D.   On: 05/24/2017 21:00    Procedures Procedures (including critical care time)  Medications Ordered in ED Medications  ipratropium (ATROVENT) 0.02 % nebulizer solution (  Not Given 05/24/17 2123)  levalbuterol (XOPENEX) 1.25 MG/0.5ML nebulizer solution (  Not Given 05/24/17 2123)  magnesium sulfate IVPB 2 g 50 mL (not administered)  diltiazem (CARDIZEM) injection 10 mg (10 mg Intravenous Given 05/24/17 2107)  sodium chloride 0.9 % bolus 1,000 mL (1,000 mLs Intravenous New Bag/Given 05/24/17 2117)  levalbuterol (XOPENEX) nebulizer solution 1.25 mg (1.25 mg Nebulization Given 05/24/17 2120)  potassium chloride SA (K-DUR,KLOR-CON) CR tablet 40 mEq (40 mEq Oral Given 05/24/17 2202)     Initial Impression / Assessment and Plan / ED Course  I have reviewed the triage vital signs and the nursing notes.  Pertinent labs & imaging results that were available during my care of the patient were reviewed by me and considered in my medical decision making (see chart for details).    CRITICAL CARE Performed by: Bethann BerkshireJoseph Carlton Buskey Total critical care time: 35 minutes Critical care time was exclusive of separately billable procedures and treating other patients. Critical care was necessary to treat or prevent imminent or  life-threatening deterioration. Critical care was time spent personally by me on the following activities: development of treatment plan with patient and/or surrogate as well as nursing, discussions with consultants, evaluation of patient's response to treatment, examination of patient, obtaining history from patient or surrogate, ordering and performing treatments and interventions, ordering and review of laboratory studies, ordering and review of radiographic studies, pulse oximetry and re-evaluation of patient's condition.   Patient has a rapid atrial fib.  She was given diltiazem initially to slow her rate down and it was successful but her blood pressure dropped down to 80 so she was given some fluids.  Patient was also given Xopenex which improved her wheezing.  Patient blood pressure responded to the fluids and was admitted to medicine for new onset atrial fib  Final Clinical Impressions(s) / ED Diagnoses   Final diagnoses:  Atrial fibrillation with RVR [facility](HCC)    ED Discharge Orders    None       Bethann BerkshireZammit, Marjorie Lussier, MD 05/24/17 2213

## 2017-05-24 NOTE — ED Notes (Signed)
Dr Ortiz at bedside 

## 2017-05-24 NOTE — ED Triage Notes (Signed)
Pt c/o sob for the last 6 days; pt seen her doctor x 4 days ago and was given cough syrup and antibiotic and pt states she is still having sob; pt c/o some left side but she states she thinks its due to her cough

## 2017-05-24 NOTE — ED Notes (Signed)
RT called for neb tx.

## 2017-05-24 NOTE — H&P (Signed)
History and Physical    Tina [name] GMW:[id number] DOB: 07/12/1950 DOA: 05/24/2017  PCP: Salley Scarlet, MD   Patient coming from: Home.  I have personally briefly reviewed patient's old medical records in [facility]  Chief Complaint: Shortness of breath and cough.  HPI: Tina [name] is a 67 y.o. female with medical history significant of chronic diastolic CHF, depression, type 2 diabetes, fibromyalgia, GERD, hyperlipidemia, hypertension, history of stroke, superficial thrombophlebitis of right leg who is coming to the emergency room due to progressively worse dyspnea, associated with yellowish sputum producing cough, wheezing, fever, chills, night sweats, decreased appetite and decreased sleep.  She complains of pleuritic chest pain with cough, but denies precordial or typical chest pain, dizziness, palpitations, PND, orthopnea or recent pitting edema of the lower external.  She denies nausea, emesis, but complains of abdominal pain when coughing.  No diarrhea, no constipation, melena or hematochezia.  Complains of worsening urinary incontinence with cough, but denies dysuria, frequency or hematuria.  No polyuria, polydipsia or blurred vision.  She also mentions developing a pruritic rash on her extremities and trunk since the febrile illness started.  ED Course: Initial vital signs temperature 97.20F, pulse 106, respirations 25, blood pressure 114/85 mmHg and O2 sat 93% on room air.  She received metoprolol 5 mg IVP for rate control, but became hypotensive with a systolic in the 80s and 90s, which responded to a 1 L normal saline bolus.  She received K Dur 40 mEq x1 dose, supplemental oxygen and a Xopenex nebulizer treatment.  I added methylprednisolone 125 mg IVP x1, Toradol 30 mg IVP for pleuritic chest wall pain.  Her workup EKGs show atrial fibrillation with RVR.  Troponin was normal.  BNP was 184 pg/mL.  Her CBC was normal.  CMP showed a sodium of 3.3 millimolar/L.  Glucose was  106 mg/dL and total protein 8.2 g/dL.  All other values were normal.  Magnesium level was 2.0 mg/dL.  Her chest radiograph showed low lung volumes without acute disease.  Review of Systems: As per HPI otherwise 10 point review of systems negative.    Past Medical History:  Diagnosis Date  . CHF (congestive heart failure) (HCC)   . Depression   . Diabetes mellitus   . Fibromyalgia   . GERD (gastroesophageal reflux disease)   . Hyperlipidemia   . Hypertension   . Stroke (HCC)   . Superficial thrombophlebitis    right leg    Past Surgical History:  Procedure Laterality Date  . CHOLECYSTECTOMY  1991  . HERNIA REPAIR  approx 1969  . NECK SURGERY  1998  . TONSILLECTOMY AND ADENOIDECTOMY  1966     reports that she has quit smoking. Her smoking use included cigarettes. she has never used smokeless tobacco. She reports that she does not drink alcohol or use drugs.  Allergies  Allergen Reactions  . Cefuroxime Axetil Other (See Comments)    REACTION: unspecified  . Fentanyl And Related Other (See Comments)    Durgesic Patch: unknown reaction  . Niaspan [Niacin Er] Other (See Comments)    Burning all over  . Sulfur Nausea And Vomiting  . Vasotec [Enalapril] Cough  . Welchol [Colesevelam Hcl] Other (See Comments)    Muscle aches and joint pains  . Lyrica [Pregabalin] Rash and Other (See Comments)    Blistering rash around same time as starting drug  . Penicillins Rash    Family History  Problem Relation Age of Onset  . Hypertension Mother   .  Hyperlipidemia Mother   . Heart disease Mother   . Stroke Sister   . Hypertension Sister   . Hyperlipidemia Sister   . Heart disease Sister   . Diabetes Maternal Grandmother   . Diabetes Daughter    Prior to Admission medications   Medication Sig Start Date End Date Taking? Authorizing Provider  albuterol (PROVENTIL HFA;VENTOLIN HFA) 108 (90 Base) MCG/ACT inhaler Inhale 2 puffs into the lungs every 6 (six) hours as needed for  wheezing or shortness of breath. 05/23/17   Salley Scarleturham, Kawanta F, MD  aspirin EC 325 MG EC tablet Take 1 tablet (325 mg total) by mouth at bedtime. 03/12/17   Filbert SchilderKadolph, Alexandria U, MD  azithromycin (ZITHROMAX) 250 MG tablet Day 1: Take 2 daily. Days 2 -5: Take 1 daily. 05/19/17   Dorena Bodoixon, Mary B, PA-C  cetirizine (ZYRTEC) 10 MG tablet Take 1 tablet (10 mg total) by mouth daily. Patient not taking: Reported on 05/19/2017 06/03/16   Salley Scarleturham, Kawanta F, MD  chlorhexidine (HIBICLENS) 4 % external liquid Apply topically daily as needed. 04/07/16   Salley Scarleturham, Kawanta F, MD  clopidogrel (PLAVIX) 75 MG tablet Take 1 tablet (75 mg total) daily by mouth. 03/30/17   [place], Velna HatchetKawanta F, MD  clotrimazole (LOTRIMIN) 1 % cream Apply 1 application topically 2 (two) times daily. 08/11/15   Salley Scarleturham, Kawanta F, MD  clotrimazole-betamethasone (LOTRISONE) cream Apply 1 application topically 2 (two) times daily. 12/15/16   Salley Scarleturham, Kawanta F, MD  fenofibrate 54 MG tablet TAKE 1 TABLET BY MOUTH  DAILY 05/04/17   Salley Scarleturham, Kawanta F, MD  furosemide (LASIX) 40 MG tablet Take 1 tablet twice a day Patient taking differently: Take 40 mg by mouth 2 (two) times daily as needed for fluid. Take 1 tablet twice a day 07/07/16   Salley Scarleturham, Kawanta F, MD  gabapentin (NEURONTIN) 300 MG capsule Take 1 capsule (300 mg total) by mouth at bedtime. 12/15/16   Salley Scarleturham, Kawanta F, MD  guaiFENesin (MUCINEX) 600 MG 12 hr tablet Take 1 tablet (600 mg total) by mouth 2 (two) times daily. 05/23/17   [place], Velna HatchetKawanta F, MD  HYDROcodone-acetaminophen (NORCO) 5-325 MG tablet Take 1 tablet every 6 (six) hours as needed by mouth for moderate pain. Patient not taking: Reported on 05/19/2017 03/30/17   Salley Scarleturham, Kawanta F, MD  HYDROcodone-homatropine [facility](HYCODAN) 5-1.5 MG/5ML syrup Take 5 mLs by mouth every 6 (six) hours as needed for cough. 05/19/17   Dorena Bodoixon, Mary B, PA-C  insulin aspart (NOVOLOG) 100 UNIT/ML injection Inject 40 units before breakfast & lunch and 60 units at supper  12/17/16   Romero BellingEllison, Sean, MD  insulin detemir (LEVEMIR) 100 UNIT/ML injection Inject 0.55 mLs (55 Units total) into the skin at bedtime. 04/12/16   Romero BellingEllison, Sean, MD  ipratropium (ATROVENT) 0.02 % nebulizer solution Take 2.5 mLs (0.5 mg total) by nebulization every 4 (four) hours as needed for wheezing or shortness of breath. 05/14/15   Allayne Butcherixon, Mary B, PA-C  ipratropium (ATROVENT) 0.06 % nasal spray Place 2 sprays into both nostrils 4 (four) times daily as needed for rhinitis. 10/05/16   Salley Scarleturham, Kawanta F, MD  losartan (COZAAR) 100 MG tablet TAKE 1 TABLET BY MOUTH  DAILY 05/04/17   Salley Scarleturham, Kawanta F, MD  metoprolol tartrate (LOPRESSOR) 25 MG tablet 0.25 tablets PO twice a day 03/12/17   Filbert SchilderKadolph, Alexandria U, MD  metoprolol tartrate (LOPRESSOR) 25 MG tablet TAKE ONE-HALF TABLET BY  MOUTH TWO TIMES DAILY 05/04/17   Salley Scarleturham, Kawanta F, MD  montelukast (SINGULAIR) 10 MG  tablet TAKE 1 TABLET BY MOUTH AT  BEDTIME 05/16/17   [place], Velna Hatchet, MD  nystatin (MYCOSTATIN/NYSTOP) powder Apply topically 4 (four) times daily. 12/15/16   [place], Velna Hatchet, MD  Omega-3 Fatty Acids (FISH OIL) 1000 MG CAPS Take 2 capsules (2,000 mg total) by mouth 2 (two) times daily. 04/21/17   Salley Scarlet, MD  pantoprazole (PROTONIX) 40 MG tablet Take 1 tablet (40 mg total) daily by mouth. 04/08/17   [place], Velna Hatchet, MD  PARoxetine (PAXIL) 40 MG tablet TAKE 1 TABLET BY MOUTH  EVERY MORNING 05/04/17   [place], Velna Hatchet, MD  potassium chloride (K-DUR) 10 MEQ tablet Take 1 tablet (10 mEq total) by mouth 2 (two) times daily. Patient taking differently: Take 10 mEq by mouth 2 (two) times daily as needed (when taking lasix).  06/03/16   Salley Scarlet, MD  rosuvastatin (CRESTOR) 40 MG tablet TAKE 1 TABLET BY MOUTH  EVERY EVENING 05/04/17   Salley Scarlet, MD  TRUEPLUS INSULIN SYRINGE 31G X 5/16" 1 ML MISC USE AS DIRECTED TO INJECT 12/23/14   Salley Scarlet, MD    Physical Exam: Vitals:   05/24/17 2215 05/24/17 2230 05/24/17  2300 05/24/17 2320  BP: (!) 127/94  111/85 (!) 98/54  Pulse: (!) 108  (!) 114 (!) 105  Resp: 20 16 20 16   Temp:      TempSrc:      SpO2: 100%  100% 100%  Weight:      Height:        Constitutional: NAD, calm, comfortable Eyes: PERRL, lids and conjunctivae normal ENMT: Mucous membranes are moist. Posterior pharynx clear of any exudate or lesions. Neck: normal, supple, no masses, no thyromegaly Respiratory: Decreased breath sounds on bases, with bilateral rhonchi and wheezing, no crackles. Normal respiratory effort. No accessory muscle use.  Cardiovascular: Tachycardic 106 bpm, irregularly irregular, no murmurs / rubs / gallops. No extremity edema. 2+ pedal pulses. No carotid bruits.  Abdomen: no tenderness, no masses palpated. No hepatosplenomegaly. Bowel sounds positive.  Musculoskeletal: no clubbing / cyanosis. No joint deformity upper and lower extremities. Good ROM, no contractures. Normal muscle tone.  Skin: no significant rashes, lesions, ulcers on limited skin exam Neurologic: CN 2-12 grossly intact. Sensation intact, DTR normal. Strength 5/5 in all 4.  Psychiatric: Normal judgment and insight. Alert and oriented x 4.  Mildly anxious mood.    Labs on Admission: I have personally reviewed following labs and imaging studies  CBC: Recent Labs  Lab 05/24/17 2102  WBC 9.0  NEUTROABS 4.6  HGB 14.0  HCT 42.1  MCV 95.9  PLT 284   Basic Metabolic Panel: Recent Labs  Lab 05/24/17 2102  NA 143  K 3.3*  CL 105  CO2 25  GLUCOSE 84  BUN 18  CREATININE 1.06*  CALCIUM 9.3   GFR: Estimated Creatinine Clearance: 52 mL/min (A) (by C-G formula based on SCr of 1.06 mg/dL (H)). Liver Function Tests: Recent Labs  Lab 05/24/17 2102  AST 22  ALT 20  ALKPHOS 68  BILITOT 0.6  PROT 8.2*  ALBUMIN 3.9   No results for input(s): LIPASE, AMYLASE in the last 168 hours. No results for input(s): AMMONIA in the last 168 hours. Coagulation Profile: No results for input(s): INR,  PROTIME in the last 168 hours. Cardiac Enzymes: Recent Labs  Lab 05/24/17 2102  TROPONINI <0.03   BNP (last 3 results) No results for input(s): PROBNP in the last 8760 hours. HbA1C: No results for input(s): HGBA1C  in the last 72 hours. CBG: No results for input(s): GLUCAP in the last 168 hours. Lipid Profile: No results for input(s): CHOL, HDL, LDLCALC, TRIG, CHOLHDL, LDLDIRECT in the last 72 hours. Thyroid Function Tests: No results for input(s): TSH, T4TOTAL, FREET4, T3FREE, THYROIDAB in the last 72 hours. Anemia Panel: No results for input(s): VITAMINB12, FOLATE, FERRITIN, TIBC, IRON, RETICCTPCT in the last 72 hours. Urine analysis:    Component Value Date/Time   COLORURINE YELLOW 03/30/2017 1651   APPEARANCEUR CLEAR 03/30/2017 1651   LABSPEC 1.015 03/30/2017 1651   PHURINE 5.5 03/30/2017 1651   GLUCOSEU NEGATIVE 03/30/2017 1651   HGBUR NEGATIVE 03/30/2017 1651   BILIRUBINUR NEGATIVE 03/10/2017 1752   KETONESUR NEGATIVE 03/30/2017 1651   PROTEINUR NEGATIVE 03/30/2017 1651   UROBILINOGEN 0.2 09/14/2013 1508   NITRITE NEGATIVE 03/30/2017 1651   LEUKOCYTESUR NEGATIVE 03/30/2017 1651    Radiological Exams on Admission: Dg Chest Portable 1 View  Result Date: 05/24/2017 CLINICAL DATA:  Short of breath for 6 days. CHF. Chest pain with coughing. EXAM: PORTABLE CHEST 1 VIEW COMPARISON:  10/28/2016 FINDINGS: Mildly degraded exam due to AP portable technique and patient body habitus. Lower cervical spine fixation. Midline trachea. Normal heart size for level of inspiration. No pleural effusion or pneumothorax. Low lung volumes with resultant pulmonary interstitial prominence. No lobar consolidation. No congestive failure. IMPRESSION: Low lung volumes, without acute disease. Electronically Signed   By: Jeronimo Greaves M.D.   On: 05/24/2017 21:00    EKG: Independently reviewed. Vent. rate 124 BPM PR interval * ms QRS duration 97 ms QT/QTc 344/484 ms P-R-T axes * 67 -60 Atrial  fibrillation Ventricular premature complex Low voltage, precordial leads Repol abnrm suggests ischemia, diffuse leads Baseline wander in lead(s) I II III aVL aVF V1  Assessment/Plan Principal Problem:   New onset atrial fibrillation (HCC) CHA?DS?-VASc Score of at least 7. Admit to stepdown/inpatient. Continue IV fluids. Optimize electrolytes. Continue metoprolol 12.5 mg p.o. twice daily. Metoprolol 2.5 mg every 4 hours as needed for heart rate over 110 bpm Check echocardiogram. Trend troponin levels. Consult cardiology. I discussed her CHA?DS?-VASc score and anticoagulation with the patient, heparin versus Lovenox.  She agreed to receive a single dose of Lovenox until she discusses this with cardiology.  Active Problems:   COPD with acute exacerbation (HCC) Continue supplemental oxygen. Single dose of Solu-Medrol 125 mg IVP given. Xopenex and ipratropium nebulizer treatments.    Diabetes mellitus type II, uncontrolled (HCC) Hemoglobin A1c was 9.1% on 03/11/2017 Continue Levemir 55 units at bedtime. Continue NovoLog 60 units in a.m. with meals. Continue NovoLog 40 units in p.m. with meals. CBG monitoring with regular insulin sliding scale.    Hyperlipidemia Continue Crestor 40 mg p.o. daily. monitor LFTs as needed. Fasting lipid panel to be followed as an outpatient.    Essential hypertension Continue losartan 100 mg p.o. daily. Continue metoprolol 12.5 mg p.o. twice daily. Monitor blood pressure, heart rate, renal function and electrolytes.    Fibromyalgia Continue gabapentin 300 mg p.o. at bedtime. Start analgesics as needed.    Depression Continue paroxetine 40 mg p.o. daily.    GERD (gastroesophageal reflux disease) Pantoprazole 40 mg p.o. daily.    Obstructive sleep apnea Not on CPAP.    Urinary incontinence Continue COPD and cough treatment to decrease incidence.    Seasonal allergies Continue Singulair 10 mg p.o. at bedtime. Continue Zyrtec 10 mg  p.o. daily or formulary equivalent.    Hypokalemia Replacing. Magnesium was supplemented. Follow-up potassium level.   DVT  prophylaxis: Single dose Lovenox given. Code Status: Full code. Family Communication:  Disposition Plan: Admit for atrial fibrillation treatment and workup. Consults called: Cardiology consult. Admission status: Inpatient/stepdown.   Bobette Mo MD [facility] Pager [phone].  If 7PM-7AM, please contact night-coverage [email or web address] Password [email or web address]  05/24/2017, 11:51 PM

## 2017-05-25 ENCOUNTER — Inpatient Hospital Stay (HOSPITAL_COMMUNITY): Payer: Medicare HMO

## 2017-05-25 ENCOUNTER — Ambulatory Visit: Payer: Self-pay | Admitting: Family Medicine

## 2017-05-25 DIAGNOSIS — I4891 Unspecified atrial fibrillation: Secondary | ICD-10-CM

## 2017-05-25 LAB — RESPIRATORY PANEL BY PCR
ADENOVIRUS-RVPPCR: NOT DETECTED
Bordetella pertussis: NOT DETECTED
CORONAVIRUS 229E-RVPPCR: NOT DETECTED
CORONAVIRUS NL63-RVPPCR: NOT DETECTED
CORONAVIRUS OC43-RVPPCR: NOT DETECTED
Chlamydophila pneumoniae: NOT DETECTED
Coronavirus HKU1: NOT DETECTED
Influenza A: NOT DETECTED
Influenza B: NOT DETECTED
MYCOPLASMA PNEUMONIAE-RVPPCR: NOT DETECTED
Metapneumovirus: NOT DETECTED
PARAINFLUENZA VIRUS 1-RVPPCR: NOT DETECTED
Parainfluenza Virus 2: NOT DETECTED
Parainfluenza Virus 3: NOT DETECTED
Parainfluenza Virus 4: NOT DETECTED
Respiratory Syncytial Virus: NOT DETECTED
Rhinovirus / Enterovirus: NOT DETECTED

## 2017-05-25 LAB — COMPREHENSIVE METABOLIC PANEL
ALBUMIN: 3.7 g/dL (ref 3.5–5.0)
ALK PHOS: 67 U/L (ref 38–126)
ALT: 18 U/L (ref 14–54)
ANION GAP: 12 (ref 5–15)
AST: 19 U/L (ref 15–41)
BILIRUBIN TOTAL: 0.8 mg/dL (ref 0.3–1.2)
BUN: 21 mg/dL — AB (ref 6–20)
CALCIUM: 8.5 mg/dL — AB (ref 8.9–10.3)
CO2: 21 mmol/L — AB (ref 22–32)
Chloride: 104 mmol/L (ref 101–111)
Creatinine, Ser: 0.92 mg/dL (ref 0.44–1.00)
GFR calc Af Amer: >60 mL/min (ref >60)
GFR calc non Af Amer: >60 mL/min (ref >60)
GLUCOSE: 307 mg/dL — AB (ref 65–99)
Potassium: 4.4 mmol/L (ref 3.5–5.1)
SODIUM: 137 mmol/L (ref 135–145)
TOTAL PROTEIN: 7.8 g/dL (ref 6.5–8.1)

## 2017-05-25 LAB — GLUCOSE, CAPILLARY
GLUCOSE-CAPILLARY: 288 mg/dL — AB (ref 65–99)
Glucose-Capillary: 273 mg/dL — ABNORMAL HIGH (ref 65–99)
Glucose-Capillary: 382 mg/dL — ABNORMAL HIGH (ref 65–99)
Glucose-Capillary: 398 mg/dL — ABNORMAL HIGH (ref 65–99)
Glucose-Capillary: 360 mg/dL — ABNORMAL HIGH (ref 65–99)

## 2017-05-25 LAB — CBC
HEMATOCRIT: 40.3 % (ref 36.0–46.0)
HEMOGLOBIN: 13.5 g/dL (ref 12.0–15.0)
MCH: 32.2 pg (ref 26.0–34.0)
MCHC: 33.5 g/dL (ref 30.0–36.0)
MCV: 96.2 fL (ref 78.0–100.0)
Platelets: 225 10*3/uL (ref 150–400)
RBC: 4.19 MIL/uL (ref 3.87–5.11)
RDW: 13.5 % (ref 11.5–15.5)
WBC: 7 10*3/uL (ref 4.0–10.5)

## 2017-05-25 LAB — ECHOCARDIOGRAM COMPLETE
AVLVOTPG: 5 mmHg
CHL CUP DOP CALC LVOT VTI: 23.2 cm
CHL CUP RV SYS PRESS: 45 mmHg
CHL CUP STROKE VOLUME: 38 mL
E decel time: 169 msec
FS: 39 % (ref 28–44)
HEIGHTINCHES: 59 in
IVS/LV PW RATIO, ED: 1.26
LA ID, A-P, ES: 43 mm
LA diam end sys: 43 mm
LA diam index: 2.15 cm/m2
LA vol A4C: 58 ml
LAVOL: 63.7 mL
LAVOLIN: 31.9 mL/m2
LV PW d: 11.4 mm — AB (ref 0.6–1.1)
LV SIMPSON'S DISK: 62
LV dias vol index: 31 mL/m2
LV sys vol: 24 mL
LVDIAVOL: 61 mL (ref 46–106)
LVOT SV: 66 mL
LVOT area: 2.84 cm2
LVOT diameter: 19 mm
LVOT peak vel: 112 cm/s
LVSYSVOLIN: 12 mL/m2
MV Dec: 169
MV pk A vel: 69.5 m/s
MV pk E vel: 138 m/s
MVPG: 8 mmHg
Reg peak vel: 303 cm/s
TAPSE: 14.3 mm
TRMAXVEL: 303 cm/s
Weight: 3200 oz

## 2017-05-25 LAB — TROPONIN I: Troponin I: <0.03 ng/mL (ref <0.03)

## 2017-05-25 LAB — MAGNESIUM: Magnesium: 2 mg/dL (ref 1.7–2.4)

## 2017-05-25 LAB — HEMOGLOBIN A1C
HEMOGLOBIN A1C: 8.7 % — AB (ref 4.8–5.6)
MEAN PLASMA GLUCOSE: 202.99 mg/dL

## 2017-05-25 LAB — MRSA PCR SCREENING: MRSA by PCR: POSITIVE — AB

## 2017-05-25 MED ORDER — CHLORHEXIDINE GLUCONATE CLOTH 2 % EX PADS
6.0000 | MEDICATED_PAD | Freq: Every day | CUTANEOUS | Status: DC
  Administered 2017-05-26: 6 via TOPICAL

## 2017-05-25 MED ORDER — BENZONATATE 100 MG PO CAPS
100.0000 mg | ORAL_CAPSULE | Freq: Three times a day (TID) | ORAL | Status: DC | PRN
  Administered 2017-05-25 – 2017-05-26 (×2): 100 mg via ORAL
  Filled 2017-05-25 (×3): qty 1

## 2017-05-25 MED ORDER — LEVALBUTEROL HCL 0.63 MG/3ML IN NEBU
0.6300 mg | INHALATION_SOLUTION | Freq: Three times a day (TID) | RESPIRATORY_TRACT | Status: DC
  Administered 2017-05-26: 0.63 mg via RESPIRATORY_TRACT
  Filled 2017-05-25: qty 3

## 2017-05-25 MED ORDER — IPRATROPIUM BROMIDE 0.02 % IN SOLN
0.5000 mg | Freq: Three times a day (TID) | RESPIRATORY_TRACT | Status: DC
  Administered 2017-05-26: 0.5 mg via RESPIRATORY_TRACT
  Filled 2017-05-25: qty 2.5

## 2017-05-25 MED ORDER — INSULIN ASPART 100 UNIT/ML ~~LOC~~ SOLN
4.0000 [IU] | Freq: Three times a day (TID) | SUBCUTANEOUS | Status: DC
  Administered 2017-05-25 (×2): 4 [IU] via SUBCUTANEOUS

## 2017-05-25 MED ORDER — APIXABAN 5 MG PO TABS
5.0000 mg | ORAL_TABLET | Freq: Two times a day (BID) | ORAL | Status: DC
  Administered 2017-05-25 – 2017-05-26 (×2): 5 mg via ORAL
  Filled 2017-05-25 (×2): qty 1

## 2017-05-25 MED ORDER — METOPROLOL TARTRATE 25 MG PO TABS
25.0000 mg | ORAL_TABLET | Freq: Two times a day (BID) | ORAL | Status: DC
  Administered 2017-05-25 – 2017-05-26 (×2): 25 mg via ORAL
  Filled 2017-05-25 (×2): qty 1

## 2017-05-25 MED ORDER — INSULIN ASPART 100 UNIT/ML ~~LOC~~ SOLN
0.0000 [IU] | Freq: Every day | SUBCUTANEOUS | Status: DC
  Administered 2017-05-25: 5 [IU] via SUBCUTANEOUS

## 2017-05-25 MED ORDER — INSULIN ASPART 100 UNIT/ML ~~LOC~~ SOLN
0.0000 [IU] | Freq: Three times a day (TID) | SUBCUTANEOUS | Status: DC
  Administered 2017-05-25 (×2): 15 [IU] via SUBCUTANEOUS
  Administered 2017-05-26: 3 [IU] via SUBCUTANEOUS

## 2017-05-25 MED ORDER — MUPIROCIN 2 % EX OINT
1.0000 "application " | TOPICAL_OINTMENT | Freq: Two times a day (BID) | CUTANEOUS | Status: DC
  Administered 2017-05-25 – 2017-05-26 (×3): 1 via NASAL
  Filled 2017-05-25 (×2): qty 22

## 2017-05-25 NOTE — Progress Notes (Signed)
   Contacted patient's Neurologist who was in agreement with stopping Plavix and switching to Eliquis. Will order Eliquis 5mg  BID. Have consulted Case Management for benefits check. Made the patient aware of the medication changes.   Signed, Ellsworth LennoxBrittany M Angelyn Osterberg, PA-C 05/25/2017, 12:29 PM

## 2017-05-25 NOTE — Consult Note (Signed)
Cardiology Consult    Patient ID: JESELLE HISER; [id number]; 04-08-1951   Admit date: 05/24/2017 Date of Consult: 05/25/2017  Primary Care Provider: Salley Scarlet, MD Primary Cardiologist: Dr. Purvis Sheffield   Patient Profile    MARCE SCHARTZ is a 67 y.o. female with past medical history of chronic diastolic CHF, HTN, HLD, Type 2 DM, OSA, prior tobacco use, and recently diagnosed vertebral artery dissection (occurring in 02/2017) who is being seen today for the evaluation of atrial fibrillation with RVR at the request of Dr. Loreta Ave.   History of Present Illness    Ms. Zaucha was admitted in 02/2017 for stroke-like symptoms but was found to have a vertebral artery dissection. She was initially placed on Heparin with plans to transition to Coumadin but it was discussed with Neurology and she was discharged on ASA and Plavix instead. She followed up with Vascular Surgery as an outpatient and there was noting to do from a surgical perspective. CTA of the neck had showed 60% RICA and < 50% LICA stenosis, therefore they recommended repeat carotid dopplers in 6 months. Neurolgy (Dr. Everlena Cooper) recommended continuing both ASA and Plavix for 2 months then Plavix indefinitely.   At the time of her follow-up visit with Dr. Purvis Sheffield on 04/21/2017 she was doing well and denied any recent chest pain or dyspnea on exertion. Was continued on her current medication regimen at that time.   The patient presented to [facility] ED on 05/24/2016 for evaluation of worsening dyspnea and a productive cough. Reports being treated as an outpatient, having been prescribed antibiotics and cough syrup with minimal improvement in her symptoms. She has noticed occasional palpitations over the past several weeks but thought this was due to anxiety as she has been under social/financial stress after just recently moving back in with her ex-husband. She denies any recent chest pain, orthopnea, PND, or lower extremity edema.    Initial labs showed WBC of 9.0, Hgb 14.0, platelets 284, Na+ 143, K+ 3.3, creatinine 1.06. BNP 184. Mg 2.0. Initial and repeat troponin values negative. CXR without acute findings. EKG showed atrial fibrillation with RVR, HR 124.  She was given a dose of IV Cardizem while in the ED but became hypotensive with this, requiring administration of IVF. She was therefore started on PO Lopressor 12.5mg  BID and admitted for further management of her atrial fibrillation and acute COPD exacerbation.   Her HR has improved into the 90's to low-100's. She was continued on her PTA Losartan at the time of admission with this having just been recently administered.   Past Medical History:  Diagnosis Date  . CHF (congestive heart failure) (HCC)   . Depression   . Diabetes mellitus   . Fibromyalgia   . GERD (gastroesophageal reflux disease)   . Hyperlipidemia   . Hypertension   . Stroke (HCC)   . Superficial thrombophlebitis    right leg    Past Surgical History:  Procedure Laterality Date  . CHOLECYSTECTOMY  1991  . HERNIA REPAIR  approx 1969  . NECK SURGERY  1998  . TONSILLECTOMY AND ADENOIDECTOMY  1966     Home Medications:  Prior to Admission medications   Medication Sig Start Date End Date Taking? Authorizing Provider  albuterol (PROVENTIL HFA;VENTOLIN HFA) 108 (90 Base) MCG/ACT inhaler Inhale 2 puffs into the lungs every 6 (six) hours as needed for wheezing or shortness of breath. 05/23/17   Salley Scarlet, MD  aspirin EC 325 MG EC tablet Take  1 tablet (325 mg total) by mouth at bedtime. 03/12/17   Filbert Schilder, MD  azithromycin (ZITHROMAX) 250 MG tablet Day 1: Take 2 daily. Days 2 -5: Take 1 daily. 05/19/17   Dorena Bodo, PA-C  cetirizine (ZYRTEC) 10 MG tablet Take 1 tablet (10 mg total) by mouth daily. Patient not taking: Reported on 05/19/2017 06/03/16   Salley Scarlet, MD  chlorhexidine (HIBICLENS) 4 % external liquid Apply topically daily as needed. 04/07/16   Salley Scarlet, MD  clopidogrel (PLAVIX) 75 MG tablet Take 1 tablet (75 mg total) daily by mouth. 03/30/17   [place], Velna Hatchet, MD  clotrimazole (LOTRIMIN) 1 % cream Apply 1 application topically 2 (two) times daily. 08/11/15   Salley Scarlet, MD  clotrimazole-betamethasone (LOTRISONE) cream Apply 1 application topically 2 (two) times daily. 12/15/16   Salley Scarlet, MD  fenofibrate 54 MG tablet TAKE 1 TABLET BY MOUTH  DAILY 05/04/17   Salley Scarlet, MD  furosemide (LASIX) 40 MG tablet Take 1 tablet twice a day Patient taking differently: Take 40 mg by mouth 2 (two) times daily as needed for fluid. Take 1 tablet twice a day 07/07/16   Salley Scarlet, MD  gabapentin (NEURONTIN) 300 MG capsule Take 1 capsule (300 mg total) by mouth at bedtime. 12/15/16   Salley Scarlet, MD  guaiFENesin (MUCINEX) 600 MG 12 hr tablet Take 1 tablet (600 mg total) by mouth 2 (two) times daily. 05/23/17   [place], Velna Hatchet, MD  HYDROcodone-acetaminophen (NORCO) 5-325 MG tablet Take 1 tablet every 6 (six) hours as needed by mouth for moderate pain. Patient not taking: Reported on 05/19/2017 03/30/17   Salley Scarlet, MD  HYDROcodone-homatropine [facility]) 5-1.5 MG/5ML syrup Take 5 mLs by mouth every 6 (six) hours as needed for cough. 05/19/17   Dorena Bodo, PA-C  insulin aspart (NOVOLOG) 100 UNIT/ML injection Inject 40 units before breakfast & lunch and 60 units at supper 12/17/16   Romero Belling, MD  insulin detemir (LEVEMIR) 100 UNIT/ML injection Inject 0.55 mLs (55 Units total) into the skin at bedtime. 04/12/16   Romero Belling, MD  ipratropium (ATROVENT) 0.02 % nebulizer solution Take 2.5 mLs (0.5 mg total) by nebulization every 4 (four) hours as needed for wheezing or shortness of breath. 05/14/15   Allayne Butcher B, PA-C  ipratropium (ATROVENT) 0.06 % nasal spray Place 2 sprays into both nostrils 4 (four) times daily as needed for rhinitis. 10/05/16   Salley Scarlet, MD  losartan (COZAAR) 100 MG tablet TAKE 1  TABLET BY MOUTH  DAILY 05/04/17   Salley Scarlet, MD  metoprolol tartrate (LOPRESSOR) 25 MG tablet 0.25 tablets PO twice a day 03/12/17   Filbert Schilder, MD  metoprolol tartrate (LOPRESSOR) 25 MG tablet TAKE ONE-HALF TABLET BY  MOUTH TWO TIMES DAILY 05/04/17   Salley Scarlet, MD  montelukast (SINGULAIR) 10 MG tablet TAKE 1 TABLET BY MOUTH AT  BEDTIME 05/16/17   [place], Velna Hatchet, MD  nystatin (MYCOSTATIN/NYSTOP) powder Apply topically 4 (four) times daily. 12/15/16   [place], Velna Hatchet, MD  Omega-3 Fatty Acids (FISH OIL) 1000 MG CAPS Take 2 capsules (2,000 mg total) by mouth 2 (two) times daily. 04/21/17   Salley Scarlet, MD  pantoprazole (PROTONIX) 40 MG tablet Take 1 tablet (40 mg total) daily by mouth. 04/08/17   Salley Scarlet, MD  PARoxetine (PAXIL) 40 MG tablet TAKE 1 TABLET BY MOUTH  EVERY MORNING 05/04/17   [place],  Velna Hatchet, MD  potassium chloride (K-DUR) 10 MEQ tablet Take 1 tablet (10 mEq total) by mouth 2 (two) times daily. Patient taking differently: Take 10 mEq by mouth 2 (two) times daily as needed (when taking lasix).  06/03/16   Salley Scarlet, MD  rosuvastatin (CRESTOR) 40 MG tablet TAKE 1 TABLET BY MOUTH  EVERY EVENING 05/04/17   Salley Scarlet, MD  TRUEPLUS INSULIN SYRINGE 31G X 5/16" 1 ML MISC USE AS DIRECTED TO INJECT 12/23/14   Salley Scarlet, MD    Inpatient Medications: Scheduled Meds: . Chlorhexidine Gluconate Cloth  6 each Topical Q0600  . gabapentin  300 mg Oral QHS  . guaiFENesin  600 mg Oral BID  . insulin aspart  0-15 Units Subcutaneous TID WC  . insulin aspart  0-5 Units Subcutaneous QHS  . insulin aspart  4 Units Subcutaneous TID WC  . insulin detemir  55 Units Subcutaneous QHS  . ipratropium  0.5 mg Nebulization Q6H  . levalbuterol  0.63 mg Nebulization Q6H  . loratadine  10 mg Oral Daily  . metoprolol tartrate  25 mg Oral BID  . montelukast  10 mg Oral QHS  . mupirocin ointment  1 application Nasal BID  . pantoprazole  40 mg Oral  Daily  . PARoxetine  40 mg Oral q morning - 10a  . rosuvastatin  40 mg Oral QPM   Continuous Infusions: . sodium chloride    . 0.9 % NaCl with KCl 40 mEq / L 88 mL/hr at 05/25/17 1000   PRN Meds: HYDROcodone-homatropine, levalbuterol, metoprolol tartrate, ondansetron **OR** ondansetron (ZOFRAN) IV  Allergies:    Allergies  Allergen Reactions  . Cefuroxime Axetil Other (See Comments)    REACTION: unspecified  . Fentanyl And Related Other (See Comments)    Durgesic Patch: unknown reaction  . Niaspan [Niacin Er] Other (See Comments)    Burning all over  . Sulfur Nausea And Vomiting  . Vasotec [Enalapril] Cough  . Welchol [Colesevelam Hcl] Other (See Comments)    Muscle aches and joint pains  . Lyrica [Pregabalin] Rash and Other (See Comments)    Blistering rash around same time as starting drug  . Penicillins Rash    Social History:   Social History   Socioeconomic History  . Marital status: Married    Spouse name: Not on file  . Number of children: Not on file  . Years of education: Not on file  . Highest education level: Not on file  Social Needs  . Financial resource strain: Not on file  . Food insecurity - worry: Not on file  . Food insecurity - inability: Not on file  . Transportation needs - medical: Not on file  . Transportation needs - non-medical: Not on file  Occupational History  . Not on file  Tobacco Use  . Smoking status: Former Smoker    Types: Cigarettes  . Smokeless tobacco: Never Used  Substance and Sexual Activity  . Alcohol use: No  . Drug use: No  . Sexual activity: Not Currently  Other Topics Concern  . Not on file  Social History Narrative  . Not on file     Family History:    Family History  Problem Relation Age of Onset  . Hypertension Mother   . Hyperlipidemia Mother   . Heart disease Mother   . Stroke Sister   . Hypertension Sister   . Hyperlipidemia Sister   . Heart disease Sister   . Diabetes Maternal Grandmother   .  Diabetes Daughter       Review of Systems    General:  No chills, fever, night sweats or weight changes.  Cardiovascular:  No edema, orthopnea, chest pain, paroxysmal nocturnal dyspnea. Positive for palpitations and dyspnea on exertion.  Dermatological: No rash, lesions/masses Respiratory: Positive for productive cough and dyspnea Urologic: No hematuria, dysuria Abdominal:   No nausea, vomiting, diarrhea, bright red blood per rectum, melena, or hematemesis Neurologic:  No visual changes, wkns, changes in mental status.  All other systems reviewed and are otherwise negative except as noted above.  Physical Exam/Data    Vitals:   05/25/17 0800 05/25/17 0806 05/25/17 0926 05/25/17 1008  BP: 127/85   (!) 118/94  Pulse: 93   95  Resp: 18   17  Temp:   (!) 97.3 F (36.3 C)   TempSrc:   Oral   SpO2: (!) 89% 94%  92%  Weight:      Height:        Intake/Output Summary (Last 24 hours) at 05/25/2017 1101 Last data filed at 05/25/2017 1000 Gross per 24 hour  Intake 1786.27 ml  Output 350 ml  Net 1436.27 ml   Filed Weights   05/24/17 2019  Weight: 200 lb (90.7 kg)   Body mass index is 40.4 kg/m.   General: Pleasant Caucasian female appearing in NAD Psych: Normal affect. Neuro: Alert and oriented X 3. Moves all extremities spontaneously. HEENT: Normal  Neck: Supple without bruits or JVD. Lungs:  Resp regular and unlabored, expiratory wheezing along upper lung fields. Heart: Irregularly irregular, no s3, s4, or murmurs. Abdomen: Soft, non-tender, non-distended, BS + x 4.  Extremities: No clubbing, cyanosis or edema. DP/PT/Radials 2+ and equal bilaterally.   EKG:  The EKG was personally reviewed and demonstrates: Atrial fibrillation with RVR, HR 124.  Telemetry:  Telemetry was personally reviewed and demonstrates: Atrial fibrillation, HR in 90's to 110's.    Labs/Studies     Relevant CV Studies:  Echocardiogram: 07/2016 Study Conclusions  - Left ventricle: The cavity  size was normal. Wall thickness was   increased in a pattern of mild LVH. Systolic function was normal.   The estimated ejection fraction was in the range of 60% to 65%.   Wall motion was normal; there were no regional wall motion   abnormalities. Features are consistent with a pseudonormal left   ventricular filling pattern, with concomitant abnormal relaxation   and increased filling pressure (grade 2 diastolic dysfunction). - Mitral valve: There was trivial regurgitation. - Left atrium: The atrium was mildly dilated. - Right atrium: Central venous pressure (est): 3 mm Hg. - Atrial septum: No defect or patent foramen ovale was identified. - Tricuspid valve: There was trivial regurgitation. - Pulmonary arteries: PA peak pressure: 31 mm Hg (S). - Pericardium, extracardiac: There was no pericardial effusion.  Impressions:  - Mild LVH with LVEF 60-65% and grade 2 diastolic dysfunction. Mild   left atrial enlargement. Trivial mitral regurgitation. Trivial   tricuspid regurgitation with estimated PASP 31 mmHg.  Laboratory Data:  Chemistry Recent Labs  Lab 05/24/17 2102 05/25/17 0319  NA 143 137  K 3.3* 4.4  CL 105 104  CO2 25 21*  GLUCOSE 84 307*  BUN 18 21*  CREATININE 1.06* 0.92  CALCIUM 9.3 8.5*  GFRNONAA 54* >60  GFRAA >60 >60  ANIONGAP 13 12    Recent Labs  Lab 05/24/17 2102 05/25/17 0319  PROT 8.2* 7.8  ALBUMIN 3.9 3.7  AST 22 19  ALT 20  18  ALKPHOS 68 67  BILITOT 0.6 0.8   Hematology Recent Labs  Lab 05/24/17 2102 05/25/17 0319  WBC 9.0 7.0  RBC 4.39 4.19  HGB 14.0 13.5  HCT 42.1 40.3  MCV 95.9 96.2  MCH 31.9 32.2  MCHC 33.3 33.5  RDW 13.4 13.5  PLT 284 225   Cardiac Enzymes Recent Labs  Lab 05/24/17 2102 05/25/17 0319  TROPONINI <0.03 <0.03   No results for input(s): TROPIPOC in the last 168 hours.  BNP Recent Labs  Lab 05/24/17 2102  BNP 184.0*    DDimer No results for input(s): DDIMER in the last 168 hours.  Radiology/Studies:    Dg Chest Portable 1 View  Result Date: 05/24/2017 CLINICAL DATA:  Short of breath for 6 days. CHF. Chest pain with coughing. EXAM: PORTABLE CHEST 1 VIEW COMPARISON:  10/28/2016 FINDINGS: Mildly degraded exam due to AP portable technique and patient body habitus. Lower cervical spine fixation. Midline trachea. Normal heart size for level of inspiration. No pleural effusion or pneumothorax. Low lung volumes with resultant pulmonary interstitial prominence. No lobar consolidation. No congestive failure. IMPRESSION: Low lung volumes, without acute disease. Electronically Signed   By: Jeronimo Greaves M.D.   On: 05/24/2017 21:00     Assessment & Plan    1. New Onset Atrial Fibrillation with RVR - recently treated as an outpatient for a worsening respiratory illness which did not improve with antibiotic therapy. Presented to the ED with worsening dyspnea and a productive cough, also found to be in atrial fibrillation with RVR at that time. No known history of the arrhythmia but does mention having intermittent palpitations for the past several weeks. - Initial labs show K+ 3.3, BNP 184. Mg 2.0. Initial and repeat troponin values negative. CXR without acute findings. Will check TSH. A repeat echocardiogram has been ordered by the admitting team.  - currently receiving PO Lopressor 12.5mg  BID with HR in the 90's to 110's. Will hold PTA Losartan and further titrate Lopressor to 25mg  BID.  - This patients CHA2DS2-VASc Score and unadjusted Ischemic Stroke Rate (% per year) is equal to 9.7 % stroke rate/year from a score of 6 (HTN, DM, Age, Female, TIA (2)). She was on ASA and Plavix PTA due to her recent vertebral artery dissection. Discussed the need for anticoagulation with the patient and she is in agreement to start this as long as her Neurologist is aware and agrees with this plan as well. Will message Dr. Everlena Cooper in regards to stopping Plavix and switching to a NOAC.   2. Chronic Diastolic CHF - echo in 07/2016  showed a preserved EF of 60-65% with Grade 2 DD. - she does not appear volume overloaded by physical examination.   3. HTN - on Losartan PTA. Was hypotensive following administration of Cardizem while in the ED.  - received Losartan 100mg  this AM. Will hold for now to allow for further titration of her AV nodal blocking agents. Can resume at lower dose prior to discharge if additional BP control is needed.    4. HLD - Lipid Panel in 02/2017 showed total cholesterol of 203, HDL 46, and LDL 85. - remains on Crestor 40mg  daily.   5. OSA - intolerant to CPAP in the past.  - consider repeat sleep study as an outpatient.   6. Vertebral Artery Dissection - recently diagnosed in 02/2017. Followed by Neurology as an outpatient.  - on ASA and Plavix. Will send a staff message to Dr. Everlena Cooper in regards to  switching from Plavix to a NOAC in the setting of her newly diagnosed atrial fibrillation.   7. Acute COPD Exacerbation - per admitting team. Continue to use Xopenex over Albuterol to avoid rebound tachycardia.    For questions or updates, please contact [facility] Please consult [email or web address] for contact info under Cardiology/STEMI.  Signed, Ellsworth Lennox, PA-C 05/25/2017, 11:01 AM Pager: [phone]  Attending note Patient seen and discussed with PA Iran Ouch, I agree with her documentation above. History of chronic diastolic HF, HTN, HL, OSA, suspected OSA, and possible acute verterbal artery dissecsion, prior CVA by imaging. Admitted with SOB and cough, fevers, and chills. In ER found to be in afib with RVR. Received IV lopressor 5mg , but has some SBP to 80s-90s.   WBC 9, Hgb 14, Plt 284, K 3.3, Cr 1.06, BNP 184, Mg 2, TSH pending Trop neg x 2 CXR no acute process Afib rate 120  Currently on lopressor 25mg  bid for rate control, just increaed this morning due to elevated rates. Change to 25mg  tid with hold parameters. CHADS2Vasc score is (age, HTN, DM, Stroke x2, vasc disease,  gender)is 7. Anticoagulation is indicated, we will touch base with her neurologist prior to starting but would recommend eliquis. From review anticoagulation has been used in the setting of cerebral dissection.   Dina Rich MD

## 2017-05-25 NOTE — Progress Notes (Signed)
*  PRELIMINARY RESULTS* Echocardiogram 2D Echocardiogram has been performed.  Stacey DrainWhite, Juvenal Umar J 05/25/2017, 3:27 PM

## 2017-05-25 NOTE — Progress Notes (Signed)
Pt educated about importance of wearing bp cuff. Still refuses to wear. Risks associated with decision have been explained. Pt adamant about not wearing it.

## 2017-05-25 NOTE — Progress Notes (Signed)
Patient briefly seen and examined database reviewed.  No family members at bedside.  Patient admitted earlier today from home due to shortness of breath and cough.  She is thought to have COPD with acute exacerbation was also found to have new onset atrial fibrillation.  Cardiology is following and has increased her Lopressor dose to 3 times daily for better rate control.  Given elevated CHADSVASC2 score she qualifies for anticoagulation, however she does have a history of a cerebral dissection.  Cardiology will touch base with neurology to determine whether anticoagulation is safe in this setting. Will transfer to the floor today.  Peggye PittEstela Hernandez, MD [facility] Pager: [phone]

## 2017-05-25 NOTE — Progress Notes (Signed)
Inpatient Diabetes Program Recommendations  AACE/ADA: New Consensus Statement on Inpatient Glycemic Control (2015)  Target Ranges:  Prepandial:   less than 140 mg/dL      Peak postprandial:   less than 180 mg/dL (1-2 hours)      Critically ill patients:  140 - 180 mg/dL  Results for Tina [name], Tina [name] (MRN [id number]) as of 05/25/2017 08:27  Ref. Range 05/24/2017 21:02 05/25/2017 03:19  Glucose Latest Ref Range: 65 - 99 mg/dL 84 [id number] ([name])   Results for Tina [name], Tina [name] (MRN [id number]) as of 05/25/2017 08:27  Ref. Range 03/11/2017 08:01  Hemoglobin A1C Latest Ref Range: 4.8 - 5.6 % 9.1 ([name])   Review of Glycemic Control  Diabetes history: DM2 Outpatient Diabetes medications: Levemir 55 units QHS, Novolog 40 units with breakfast, Novolog 60 units with supper Current orders for Inpatient glycemic control: Levemir 55 units QHS, Novolog 40-60 units BID  Inpatient Diabetes Program Recommendations: Correction (SSI): Please consider ordering Novolog 0-15 units TID with meals and Novolog 0-5 units QHS. Insulin - Meal Coverage: Please consider discontinuing Novolog 40-60 units BID and ordering Novolog 5 units TID with meals for meal coverage.   NOTE: Patient was seen by Dr. Jeanice Limurham on 03/31/17 and per office note on 03/30/17 patient has been experiencing hypoglycemia so insulin was adjusted and patient was advised to take Levemir 60 units daily and Novolog 40 units with meals.   Thanks,  [name], RN, MSN, CDE Diabetes Coordinator Inpatient Diabetes Program [phone] (Team Pager from 8am to 5pm)

## 2017-05-26 ENCOUNTER — Telehealth: Payer: Self-pay | Admitting: Endocrinology

## 2017-05-26 DIAGNOSIS — M797 Fibromyalgia: Secondary | ICD-10-CM

## 2017-05-26 DIAGNOSIS — J302 Other seasonal allergic rhinitis: Secondary | ICD-10-CM

## 2017-05-26 DIAGNOSIS — I4891 Unspecified atrial fibrillation: Secondary | ICD-10-CM

## 2017-05-26 DIAGNOSIS — E1165 Type 2 diabetes mellitus with hyperglycemia: Secondary | ICD-10-CM

## 2017-05-26 DIAGNOSIS — E876 Hypokalemia: Secondary | ICD-10-CM

## 2017-05-26 DIAGNOSIS — G4733 Obstructive sleep apnea (adult) (pediatric): Secondary | ICD-10-CM

## 2017-05-26 DIAGNOSIS — K219 Gastro-esophageal reflux disease without esophagitis: Secondary | ICD-10-CM

## 2017-05-26 DIAGNOSIS — N3946 Mixed incontinence: Secondary | ICD-10-CM

## 2017-05-26 DIAGNOSIS — J441 Chronic obstructive pulmonary disease with (acute) exacerbation: Secondary | ICD-10-CM

## 2017-05-26 DIAGNOSIS — I10 Essential (primary) hypertension: Secondary | ICD-10-CM

## 2017-05-26 LAB — TSH: TSH: 0.675 u[IU]/mL (ref 0.350–4.500)

## 2017-05-26 LAB — GLUCOSE, CAPILLARY: GLUCOSE-CAPILLARY: 168 mg/dL — AB (ref 65–99)

## 2017-05-26 MED ORDER — FUROSEMIDE 40 MG PO TABS: 40.0000 mg | ORAL_TABLET | Freq: Two times a day (BID) | ORAL | Status: DC | PRN

## 2017-05-26 MED ORDER — APIXABAN 5 MG PO TABS: 5.0000 mg | ORAL_TABLET | Freq: Two times a day (BID) | ORAL | 0 refills | Status: DC

## 2017-05-26 MED ORDER — INSULIN ASPART 100 UNIT/ML ~~LOC~~ SOLN
15.0000 [IU] | Freq: Three times a day (TID) | SUBCUTANEOUS | Status: DC
  Administered 2017-05-26: 15 [IU] via SUBCUTANEOUS

## 2017-05-26 MED ORDER — INSULIN DETEMIR 100 UNIT/ML ~~LOC~~ SOLN: 70.0000 [IU] | Freq: Every day | SUBCUTANEOUS | Status: DC

## 2017-05-26 MED ORDER — INSULIN DETEMIR 100 UNIT/ML ~~LOC~~ SOLN
60.0000 [IU] | Freq: Every day | SUBCUTANEOUS | Status: DC
  Filled 2017-05-26: qty 0.6

## 2017-05-26 MED ORDER — METOPROLOL TARTRATE 25 MG PO TABS: 25.0000 mg | ORAL_TABLET | Freq: Two times a day (BID) | ORAL | 0 refills | Status: DC

## 2017-05-26 NOTE — Telephone Encounter (Signed)
please call patient: Ov is due 

## 2017-05-26 NOTE — Discharge Summary (Addendum)
Physician Discharge Summary  Tina [name]:[id number] DOB: 12-21-1950 DOA: 05/24/2017  PCP: Salley Scarlet, MD Neurologist: Dr. Everlena Cooper Endocrinologist: Dr. Everardo All Cardiologist: Dr. Purvis Sheffield  Admit date: 05/24/2017 Discharge date: 05/26/2017  Admitted From: Home  Disposition: Home   Recommendations for Outpatient Follow-up:  1. Follow up with PCP in 1 weeks 2. Follow up with neurologist, cardiologist and endocrinologist in 2 weeks 3. Please obtain BMP/CBC in one week 4. Please follow up on the following pending results:final culture data  Discharge Condition: STABLE   CODE STATUS: FULL    Brief Hospitalization Summary: Please see all hospital notes, images, labs for full details of the hospitalization. HPI: Tina [name] is a 67 y.o. female with medical history significant of chronic diastolic CHF, depression, type 2 diabetes, fibromyalgia, GERD, hyperlipidemia, hypertension, history of stroke, superficial thrombophlebitis of right leg who is coming to the emergency room due to progressively worse dyspnea, associated with yellowish sputum producing cough, wheezing, fever, chills, night sweats, decreased appetite and decreased sleep. She complains of pleuritic chest pain with cough, but denies precordial or typical chest pain, dizziness, palpitations, PND, orthopnea or recent pitting edema of the lower external. She denies nausea, emesis, but complains of abdominal pain when coughing. No diarrhea, no constipation, melena or hematochezia. Complains of worsening urinary incontinence with cough, but denies dysuria, frequency or hematuria. No polyuria, polydipsia or blurred vision. She also mentions developing a pruritic rash on her extremities and trunk since the febrile illness started.   ED Course: Initial vital signs temperature 97.35F, pulse 106, respirations 25, blood pressure 114/85 mmHg and O2 sat 93% on room air. She received metoprolol 5 mg IVP for rate control, but became  hypotensive with a systolic in the 80s and 90s, which responded to a 1 L normal saline bolus. She received K Dur 40 mEq x1 dose, supplemental oxygen and a Xopenex nebulizer treatment. I added methylprednisolone 125 mg IVP x1, Toradol 30 mg IVP for pleuritic chest wall pain.  Her workup EKGs show atrial fibrillation with RVR. Troponin was normal. BNP was 184 pg/mL. Her CBC was normal. CMP showed a sodium of 3.3 millimolar/L. Glucose was 106 mg/dL and total protein 8.2 g/dL. All other values were normal. Magnesium level was 2.0 mg/dL. Her chest radiograph showed low lung volumes without acute disease.  Brief Admission Hx: Tina [name] a 67 y.o.femalewith medical history significant of chronic diastolic CHF, depression, type 2 diabetes, fibromyalgia, GERD, hyperlipidemia, hypertension, history of stroke, superficial thrombophlebitis of right leg who is coming to the emergency room due to progressively worse dyspnea, associated with yellowish sputum producing cough, wheezing, fever, chills, night sweats, decreased appetite and decreased sleep.   She was admitted with Afib with RVR.    MDM/Assessment & Plan:   1. New Onset chronic Afib with RVR - Pt was seen by cardiologist and started on eliquis for anticoagulation and metroprolol 25 mg BID for good rate control. She had an echocardiogram noted below.  Normal EF.  2. Vertebral artery dissection - recently diagnosed 10/18 - she had been on aspirin and plavix prior to admission, after discussion with her neurologist she has been started on eliquis 5 mg BID.  3. Uncontrolled type 2 Diabetes mellitus with vasc complications - resume home regimen at discharge and follow up with her endocrinologist.  Blood glucose monitoring 4-5 times per day recommended.  4. Hyperlipidemia Diabetic Dyslipidemia -resume home statin and blood sugar control.  5. COPD with acute exacerbation - much improved, treated with xopenex  nebs with good improvement.  Cough syrup as  needed.  6. Diabetic peripheral polyneuropathy / Fibromyalgia - resume home gabapentin treatments.  7. GERD - stable.  8. OSA - intolerant to CPAP, recommend repeat outpatient sleep study when possible.  9. Hypokalemia - treated and repleted.   Echocardiogram Study Conclusions  - Left ventricle: The cavity size was normal. Wall thickness was  increased in a pattern of moderate LVH. Systolic function was   vigorous. The estimated ejection fraction was in the range of 65%  to 70%. Wall motion was normal; there were no regional wall   motion abnormalities. - Aortic valve: Valve area (VTI): 2.24 cm^2. Valve area (Vmax):  1.87 cm^2. Valve area (Vmean): 1.95 cm^2. - Mitral valve: There was mild regurgitation. - Left atrium: The atrium was mildly dilated. - Atrial septum: No defect or patent foramen ovale was identified. - Pulmonary arteries: Systolic pressure was mildly increased. PA  peak pressure: 39 mm Hg (S). - Technically adequate study.  DVT prophylaxis: eliquis Code Status: FULL  Family Communication:  Disposition Plan: Home   Consultants:  Cardiology   Discharge Diagnoses:  Principal Problem:   New onset atrial fibrillation (HCC) Active Problems:   Diabetes mellitus type II, uncontrolled (HCC)   Hyperlipidemia   Essential hypertension   Fibromyalgia   Depression   GERD (gastroesophageal reflux disease)   Obstructive sleep apnea   Urinary incontinence   Seasonal allergies   Hypokalemia   COPD with acute exacerbation [facility])  Discharge Instructions: Discharge Instructions    Call MD for:  difficulty breathing, headache or visual disturbances   Complete by:  As directed    Call MD for:  extreme fatigue   Complete by:  As directed    Call MD for:  persistant nausea and vomiting   Complete by:  As directed    Call MD for:  severe uncontrolled pain   Complete by:  As directed    Diet - low sodium heart healthy   Complete by:  As directed    Increase activity  slowly   Complete by:  As directed      Allergies as of 05/26/2017      Reactions   Cefuroxime Axetil Other (See Comments)   REACTION: unspecified   Fentanyl And Related Other (See Comments)   Durgesic Patch: unknown reaction   Niaspan [niacin Er] Other (See Comments)   Burning all over   Sulfur Nausea And Vomiting   Vasotec [enalapril] Cough   Welchol [colesevelam Hcl] Other (See Comments)   Muscle aches and joint pains   Lyrica [pregabalin] Rash, Other (See Comments)   Blistering rash around same time as starting drug   Penicillins Rash      Medication List    STOP taking these medications   aspirin 325 MG EC tablet   azithromycin 250 MG tablet Commonly known as:  ZITHROMAX   clopidogrel 75 MG tablet Commonly known as:  PLAVIX   HYDROcodone-acetaminophen 5-325 MG tablet Commonly known as:  NORCO     TAKE these medications   albuterol 108 (90 Base) MCG/ACT inhaler Commonly known as:  PROVENTIL HFA;VENTOLIN HFA Inhale 2 puffs into the lungs every 6 (six) hours as needed for wheezing or shortness of breath.   apixaban 5 MG Tabs tablet Commonly known as:  ELIQUIS Take 1 tablet (5 mg total) by mouth 2 (two) times daily.   chlorhexidine 4 % external liquid Commonly known as:  HIBICLENS Apply topically daily as needed.   clotrimazole-betamethasone  cream Commonly known as:  LOTRISONE Apply 1 application topically 2 (two) times daily.   fenofibrate 54 MG tablet TAKE 1 TABLET BY MOUTH  DAILY   Fish Oil 1000 MG Caps Take 2 capsules (2,000 mg total) by mouth 2 (two) times daily.   furosemide 40 MG tablet Commonly known as:  LASIX Take 1 tablet (40 mg total) by mouth 2 (two) times daily as needed for fluid. Take 1 tablet twice a day What changed:    how much to take  how to take this  when to take this  reasons to take this   gabapentin 300 MG capsule Commonly known as:  NEURONTIN Take 1 capsule (300 mg total) by mouth at bedtime. What changed:    when  to take this  reasons to take this   guaiFENesin 600 MG 12 hr tablet Commonly known as:  MUCINEX Take 1 tablet (600 mg total) by mouth 2 (two) times daily.   HYDROcodone-homatropine 5-1.5 MG/5ML syrup Commonly known as:  HYCODAN Take 5 mLs by mouth every 6 (six) hours as needed for cough.   insulin aspart 100 UNIT/ML injection Commonly known as:  NOVOLOG Inject 40 units before breakfast & lunch and 60 units at supper   insulin detemir 100 UNIT/ML injection Commonly known as:  LEVEMIR Inject 0.55 mLs (55 Units total) into the skin at bedtime.   ipratropium 0.02 % nebulizer solution Commonly known as:  ATROVENT Take 2.5 mLs (0.5 mg total) by nebulization every 4 (four) hours as needed for wheezing or shortness of breath.   ipratropium 0.06 % nasal spray Commonly known as:  ATROVENT Place 2 sprays into both nostrils 4 (four) times daily as needed for rhinitis.   losartan 100 MG tablet Commonly known as:  COZAAR TAKE 1 TABLET BY MOUTH  DAILY   metoprolol tartrate 25 MG tablet Commonly known as:  LOPRESSOR Take 1 tablet (25 mg total) by mouth 2 (two) times daily. What changed:    how much to take  how to take this  when to take this  additional instructions   montelukast 10 MG tablet Commonly known as:  SINGULAIR TAKE 1 TABLET BY MOUTH AT  BEDTIME   nystatin powder Commonly known as:  MYCOSTATIN/NYSTOP Apply topically 4 (four) times daily. What changed:    how much to take  when to take this  reasons to take this   pantoprazole 40 MG tablet Commonly known as:  PROTONIX Take 1 tablet (40 mg total) daily by mouth.   PARoxetine 40 MG tablet Commonly known as:  PAXIL TAKE 1 TABLET BY MOUTH  EVERY MORNING   potassium chloride 10 MEQ tablet Commonly known as:  K-DUR Take 1 tablet (10 mEq total) by mouth 2 (two) times daily. What changed:    when to take this  reasons to take this   rosuvastatin 40 MG tablet Commonly known as:  CRESTOR TAKE 1 TABLET  BY MOUTH  EVERY EVENING   TRUEPLUS INSULIN SYRINGE 31G X 5/16" 1 ML Misc Generic drug:  Insulin Syringe-Needle U-100 USE AS DIRECTED TO INJECT      Follow-up Information    [place], Velna Hatchet, MD. Schedule an appointment as soon as possible for a visit in 1 week(s).   Specialty:  Family Medicine Why:  Hospital Follow Up  Contact information: [address] [place] [address] [phone]        Drema Dallas, DO. Schedule an appointment as soon as possible for a  visit in 2 week(s).   Specialty:  Neurology Why:  Hospital Follow Up  Contact information: [address]  [address] [place] [address] [phone]        Romero Belling, MD. Schedule an appointment as soon as possible for a visit in 2 week(s).   Specialty:  Endocrinology Why:  Hospital Follow Up  Contact information: [address] [place] [address] [phone]          Allergies  Allergen Reactions  . Cefuroxime Axetil Other (See Comments)    REACTION: unspecified  . Fentanyl And Related Other (See Comments)    Durgesic Patch: unknown reaction  . Niaspan [Niacin Er] Other (See Comments)    Burning all over  . Sulfur Nausea And Vomiting  . Vasotec [Enalapril] Cough  . Welchol [Colesevelam Hcl] Other (See Comments)    Muscle aches and joint pains  . Lyrica [Pregabalin] Rash and Other (See Comments)    Blistering rash around same time as starting drug  . Penicillins Rash   Allergies as of 05/26/2017      Reactions   Cefuroxime Axetil Other (See Comments)   REACTION: unspecified   Fentanyl And Related Other (See Comments)   Durgesic Patch: unknown reaction   Niaspan [niacin Er] Other (See Comments)   Burning all over   Sulfur Nausea And Vomiting   Vasotec [enalapril] Cough   Welchol [colesevelam Hcl] Other (See Comments)   Muscle aches and joint pains   Lyrica [pregabalin] Rash, Other (See Comments)   Blistering rash around same time as starting drug    Penicillins Rash      Medication List    STOP taking these medications   aspirin 325 MG EC tablet   azithromycin 250 MG tablet Commonly known as:  ZITHROMAX   clopidogrel 75 MG tablet Commonly known as:  PLAVIX   HYDROcodone-acetaminophen 5-325 MG tablet Commonly known as:  NORCO     TAKE these medications   albuterol 108 (90 Base) MCG/ACT inhaler Commonly known as:  PROVENTIL HFA;VENTOLIN HFA Inhale 2 puffs into the lungs every 6 (six) hours as needed for wheezing or shortness of breath.   apixaban 5 MG Tabs tablet Commonly known as:  ELIQUIS Take 1 tablet (5 mg total) by mouth 2 (two) times daily.   chlorhexidine 4 % external liquid Commonly known as:  HIBICLENS Apply topically daily as needed.   clotrimazole-betamethasone cream Commonly known as:  LOTRISONE Apply 1 application topically 2 (two) times daily.   fenofibrate 54 MG tablet TAKE 1 TABLET BY MOUTH  DAILY   Fish Oil 1000 MG Caps Take 2 capsules (2,000 mg total) by mouth 2 (two) times daily.   furosemide 40 MG tablet Commonly known as:  LASIX Take 1 tablet (40 mg total) by mouth 2 (two) times daily as needed for fluid. Take 1 tablet twice a day What changed:    how much to take  how to take this  when to take this  reasons to take this   gabapentin 300 MG capsule Commonly known as:  NEURONTIN Take 1 capsule (300 mg total) by mouth at bedtime. What changed:    when to take this  reasons to take this   guaiFENesin 600 MG 12 hr tablet Commonly known as:  MUCINEX Take 1 tablet (600 mg total) by mouth 2 (two) times daily.   HYDROcodone-homatropine 5-1.5 MG/5ML syrup Commonly known as:  HYCODAN Take 5 mLs by mouth every 6 (six) hours as  needed for cough.   insulin aspart 100 UNIT/ML injection Commonly known as:  NOVOLOG Inject 40 units before breakfast & lunch and 60 units at supper   insulin detemir 100 UNIT/ML injection Commonly known as:  LEVEMIR Inject 0.55 mLs (55 Units total)  into the skin at bedtime.   ipratropium 0.02 % nebulizer solution Commonly known as:  ATROVENT Take 2.5 mLs (0.5 mg total) by nebulization every 4 (four) hours as needed for wheezing or shortness of breath.   ipratropium 0.06 % nasal spray Commonly known as:  ATROVENT Place 2 sprays into both nostrils 4 (four) times daily as needed for rhinitis.   losartan 100 MG tablet Commonly known as:  COZAAR TAKE 1 TABLET BY MOUTH  DAILY   metoprolol tartrate 25 MG tablet Commonly known as:  LOPRESSOR Take 1 tablet (25 mg total) by mouth 2 (two) times daily. What changed:    how much to take  how to take this  when to take this  additional instructions   montelukast 10 MG tablet Commonly known as:  SINGULAIR TAKE 1 TABLET BY MOUTH AT  BEDTIME   nystatin powder Commonly known as:  MYCOSTATIN/NYSTOP Apply topically 4 (four) times daily. What changed:    how much to take  when to take this  reasons to take this   pantoprazole 40 MG tablet Commonly known as:  PROTONIX Take 1 tablet (40 mg total) daily by mouth.   PARoxetine 40 MG tablet Commonly known as:  PAXIL TAKE 1 TABLET BY MOUTH  EVERY MORNING   potassium chloride 10 MEQ tablet Commonly known as:  K-DUR Take 1 tablet (10 mEq total) by mouth 2 (two) times daily. What changed:    when to take this  reasons to take this   rosuvastatin 40 MG tablet Commonly known as:  CRESTOR TAKE 1 TABLET BY MOUTH  EVERY EVENING   TRUEPLUS INSULIN SYRINGE 31G X 5/16" 1 ML Misc Generic drug:  Insulin Syringe-Needle U-100 USE AS DIRECTED TO INJECT       Procedures/Studies: Dg Chest Portable 1 View  Result Date: 05/24/2017 CLINICAL DATA:  Short of breath for 6 days. CHF. Chest pain with coughing. EXAM: PORTABLE CHEST 1 VIEW COMPARISON:  10/28/2016 FINDINGS: Mildly degraded exam due to AP portable technique and patient body habitus. Lower cervical spine fixation. Midline trachea. Normal heart size for level of inspiration. No  pleural effusion or pneumothorax. Low lung volumes with resultant pulmonary interstitial prominence. No lobar consolidation. No congestive failure. IMPRESSION: Low lung volumes, without acute disease. Electronically Signed   By: Jeronimo Greaves M.D.   On: 05/24/2017 21:00     Subjective: Pt says that she is coughing but no other complaints.  No chest pain or palpitations or SOB.   Discharge Exam: Vitals:   05/26/17 0600 05/26/17 0758  BP:    Pulse: 63   Resp: 13   Temp:    SpO2: 95% 94%   Vitals:   05/26/17 0400 05/26/17 0500 05/26/17 0600 05/26/17 0758  BP: 135/87     Pulse: 70 74 63   Resp: (!) 23 18 13    Temp: 98.3 F (36.8 C)     TempSrc: Oral     SpO2: 96% 97% 95% 94%  Weight:  93.4 kg (205 lb 14.6 oz)    Height:       General: Pt is alert, awake, not in acute distress Cardiovascular: irregular, normal S1/S2 +, no rubs, no gallops Respiratory: CTA bilaterally, no wheezing, no rhonchi  Abdominal: Soft, NT, ND, bowel sounds + Extremities: no edema, no cyanosis   The results of significant diagnostics from this hospitalization (including imaging, microbiology, ancillary and laboratory) are listed below for reference.     Microbiology: Recent Results (from the past 240 hour(s))  MRSA PCR Screening     Status: Abnormal   Collection Time: 05/24/17 11:50 PM  Result Value Ref Range Status   MRSA by PCR POSITIVE (A) NEGATIVE Final    Comment:        The GeneXpert MRSA Assay (FDA approved for NASAL specimens only), is one component of a comprehensive MRSA colonization surveillance program. It is not intended to diagnose MRSA infection nor to guide or monitor treatment for MRSA infections. RESULT CALLED TO, READ BACK BY AND VERIFIED WITH: SMITH,F@0225  BY MATTHEWS, B 1.2.19   Respiratory Panel by PCR     Status: None   Collection Time: 05/25/17 12:03 PM  Result Value Ref Range Status   Adenovirus NOT DETECTED NOT DETECTED Final   Coronavirus 229E NOT DETECTED NOT  DETECTED Final   Coronavirus HKU1 NOT DETECTED NOT DETECTED Final   Coronavirus NL63 NOT DETECTED NOT DETECTED Final   Coronavirus OC43 NOT DETECTED NOT DETECTED Final   Metapneumovirus NOT DETECTED NOT DETECTED Final   Rhinovirus / Enterovirus NOT DETECTED NOT DETECTED Final   Influenza A NOT DETECTED NOT DETECTED Final   Influenza B NOT DETECTED NOT DETECTED Final   Parainfluenza Virus 1 NOT DETECTED NOT DETECTED Final   Parainfluenza Virus 2 NOT DETECTED NOT DETECTED Final   Parainfluenza Virus 3 NOT DETECTED NOT DETECTED Final   Parainfluenza Virus 4 NOT DETECTED NOT DETECTED Final   Respiratory Syncytial Virus NOT DETECTED NOT DETECTED Final   Bordetella pertussis NOT DETECTED NOT DETECTED Final   Chlamydophila pneumoniae NOT DETECTED NOT DETECTED Final   Mycoplasma pneumoniae NOT DETECTED NOT DETECTED Final    Comment: Performed at [facility], [address] [place], [place] [address]     Labs: BNP (last 3 results) Recent Labs    07/07/16 1454 05/24/17 2102  BNP 68.6 184.0*   Basic Metabolic Panel: Recent Labs  Lab 05/24/17 2102 05/25/17 0319  NA 143 137  K 3.3* 4.4  CL 105 104  CO2 25 21*  GLUCOSE 84 307*  BUN 18 21*  CREATININE 1.06* 0.92  CALCIUM 9.3 8.5*  MG 2.0  --    Liver Function Tests: Recent Labs  Lab 05/24/17 2102 05/25/17 0319  AST 22 19  ALT 20 18  ALKPHOS 68 67  BILITOT 0.6 0.8  PROT 8.2* 7.8  ALBUMIN 3.9 3.7   No results for input(s): LIPASE, AMYLASE in the last 168 hours. No results for input(s): AMMONIA in the last 168 hours. CBC: Recent Labs  Lab 05/24/17 2102 05/25/17 0319  WBC 9.0 7.0  NEUTROABS 4.6  --   HGB 14.0 13.5  HCT 42.1 40.3  MCV 95.9 96.2  PLT 284 225   Cardiac Enzymes: Recent Labs  Lab 05/24/17 2102 05/25/17 0319 05/25/17 0942  TROPONINI <0.03 <0.03 <0.03   BNP: Invalid input(s): POCBNP CBG: Recent Labs  Lab 05/25/17 0846 05/25/17 1154 05/25/17 1518 05/25/17 2102 05/26/17 0737  GLUCAP  288* 382* 398* 360* 168*   D-Dimer No results for input(s): DDIMER in the last 72 hours. Hgb A1c Recent Labs    05/25/17 1029  HGBA1C 8.7*   Lipid Profile No results for input(s): CHOL, HDL, LDLCALC, TRIG, CHOLHDL, LDLDIRECT in the last 72 hours. Thyroid  function studies Recent Labs    05/26/17 0526  TSH 0.675   Anemia work up No results for input(s): VITAMINB12, FOLATE, FERRITIN, TIBC, IRON, RETICCTPCT in the last 72 hours. Urinalysis    Component Value Date/Time   COLORURINE YELLOW 03/30/2017 1651   APPEARANCEUR CLEAR 03/30/2017 1651   LABSPEC 1.015 03/30/2017 1651   PHURINE 5.5 03/30/2017 1651   GLUCOSEU NEGATIVE 03/30/2017 1651   HGBUR NEGATIVE 03/30/2017 1651   BILIRUBINUR NEGATIVE 03/10/2017 1752   KETONESUR NEGATIVE 03/30/2017 1651   PROTEINUR NEGATIVE 03/30/2017 1651   UROBILINOGEN 0.2 09/14/2013 1508   NITRITE NEGATIVE 03/30/2017 1651   LEUKOCYTESUR NEGATIVE 03/30/2017 1651   Sepsis Labs Invalid input(s): PROCALCITONIN,  WBC,  LACTICIDVEN Microbiology Recent Results (from the past 240 hour(s))  MRSA PCR Screening     Status: Abnormal   Collection Time: 05/24/17 11:50 PM  Result Value Ref Range Status   MRSA by PCR POSITIVE (A) NEGATIVE Final    Comment:        The GeneXpert MRSA Assay (FDA approved for NASAL specimens only), is one component of a comprehensive MRSA colonization surveillance program. It is not intended to diagnose MRSA infection nor to guide or monitor treatment for MRSA infections. RESULT CALLED TO, READ BACK BY AND VERIFIED WITH: SMITH,F@0225  BY MATTHEWS, B 1.2.19   Respiratory Panel by PCR     Status: None   Collection Time: 05/25/17 12:03 PM  Result Value Ref Range Status   Adenovirus NOT DETECTED NOT DETECTED Final   Coronavirus 229E NOT DETECTED NOT DETECTED Final   Coronavirus HKU1 NOT DETECTED NOT DETECTED Final   Coronavirus NL63 NOT DETECTED NOT DETECTED Final   Coronavirus OC43 NOT DETECTED NOT DETECTED Final    Metapneumovirus NOT DETECTED NOT DETECTED Final   Rhinovirus / Enterovirus NOT DETECTED NOT DETECTED Final   Influenza A NOT DETECTED NOT DETECTED Final   Influenza B NOT DETECTED NOT DETECTED Final   Parainfluenza Virus 1 NOT DETECTED NOT DETECTED Final   Parainfluenza Virus 2 NOT DETECTED NOT DETECTED Final   Parainfluenza Virus 3 NOT DETECTED NOT DETECTED Final   Parainfluenza Virus 4 NOT DETECTED NOT DETECTED Final   Respiratory Syncytial Virus NOT DETECTED NOT DETECTED Final   Bordetella pertussis NOT DETECTED NOT DETECTED Final   Chlamydophila pneumoniae NOT DETECTED NOT DETECTED Final   Mycoplasma pneumoniae NOT DETECTED NOT DETECTED Final    Comment: Performed at [facility], [address] [place], [place] [address]   Time coordinating discharge: 35 mins  SIGNED:  Standley Dakinslanford Johnson, MD  [facility] 05/26/2017, 8:27 AM Pager [phone]  If 7PM-7AM, please contact night-coverage [email or web address] Password [email or web address]

## 2017-05-26 NOTE — Care Management (Signed)
Benefits check in progress. Will give 30 day coupon when results of benefits check are back.

## 2017-05-26 NOTE — Care Management (Signed)
Patient notified by RN of beneftis check results Per rep [facility]-  Eliquis: estimated co-pay at retail: $142 for 30 day retail //$95 applied to deductible /$47 is the actual co-pay.  30 day coupon given.

## 2017-05-26 NOTE — Discharge Instructions (Signed)
Follow up with Tina MustBrittany Strader,PA Cardiology January 18th 1:00pm Follow with Primary MD  Tina [name], Tina F, MD  and other consultant's as instructed your Hospitalist MD  Please get a complete blood count and chemistry panel checked by your Primary MD at your next visit, and again as instructed by your Primary MD.  Get Medicines reviewed and adjusted: Please take all your medications with you for your next visit with your Primary MD  Laboratory/radiological data: Please request your Primary MD to go over all hospital tests and procedure/radiological results at the follow up, please ask your Primary MD to get all Hospital records sent to his/her office.  In some cases, they will be blood work, cultures and biopsy results pending at the time of your discharge. Please request that your primary care M.D. follows up on these results.  Also Note the following: If you experience worsening of your admission symptoms, develop shortness of breath, life threatening emergency, suicidal or homicidal thoughts you must seek medical attention immediately by calling 911 or calling your MD immediately  if symptoms less severe.  You must read complete instructions/literature along with all the possible adverse reactions/side effects for all the Medicines you take and that have been prescribed to you. Take any new Medicines after you have completely understood and accpet all the possible adverse reactions/side effects.   Do not drive when taking Pain medications or sleeping medications (Benzodaizepines)  Do not take more than prescribed Pain, Sleep and Anxiety Medications. It is not advisable to combine anxiety,sleep and pain medications without talking with your primary care practitioner  Special Instructions: If you have smoked or chewed Tobacco  in the last 2 yrs please stop smoking, stop any regular Alcohol  and or any Recreational drug use.  Wear Seat belts while driving.  Please note: You were cared for  by a hospitalist during your hospital stay. Once you are discharged, your primary care physician will handle any further medical issues. Please note that NO REFILLS for any discharge medications will be authorized once you are discharged, as it is imperative that you return to your primary care physician (or establish a relationship with a primary care physician if you do not have one) for your post hospital discharge needs so that they can reassess your need for medications and monitor your lab values.

## 2017-05-26 NOTE — Progress Notes (Signed)
Reviewed pt discharge and RX instructions with pt verb understanding. Pt left via wheelchair with all belongings in NAD.

## 2017-05-26 NOTE — Care Management Important Message (Signed)
Important Message  Patient Details  Name: Richardean CanalJudy H Qadir MRN: [id number] Date of Birth: 1950-10-19   Medicare Important Message Given:  Yes    Jodie Leiner, Chrystine OilerSharley Diane, RN 05/26/2017, 8:51 AM

## 2017-05-27 ENCOUNTER — Telehealth: Payer: Self-pay | Admitting: Family Medicine

## 2017-05-27 MED ORDER — IPRATROPIUM BROMIDE 0.02 % IN SOLN: 0.5000 mg | RESPIRATORY_TRACT | 3 refills | Status: DC | PRN

## 2017-05-27 NOTE — Telephone Encounter (Signed)
Prescription sent to pharmacy.

## 2017-05-27 NOTE — Telephone Encounter (Signed)
Pt calling to request refill on ipratrotium-bromide sent [facility] in [place].

## 2017-05-30 NOTE — Telephone Encounter (Signed)
I called and left patient VM to call back to schedule f/u appt.

## 2017-05-31 ENCOUNTER — Ambulatory Visit: Payer: Medicare HMO | Admitting: Neurology

## 2017-06-03 ENCOUNTER — Other Ambulatory Visit: Payer: Self-pay

## 2017-06-03 ENCOUNTER — Ambulatory Visit (INDEPENDENT_AMBULATORY_CARE_PROVIDER_SITE_OTHER): Payer: Medicare HMO | Admitting: Family Medicine

## 2017-06-03 ENCOUNTER — Encounter: Payer: Self-pay | Admitting: Family Medicine

## 2017-06-03 VITALS — BP 136/72 | HR 82 | Temp 98.7°F | Resp 16 | Ht 59.0 in | Wt 195.0 lb

## 2017-06-03 DIAGNOSIS — I480 Paroxysmal atrial fibrillation: Secondary | ICD-10-CM | POA: Diagnosis not present

## 2017-06-03 DIAGNOSIS — J040 Acute laryngitis: Secondary | ICD-10-CM

## 2017-06-03 DIAGNOSIS — E1165 Type 2 diabetes mellitus with hyperglycemia: Secondary | ICD-10-CM

## 2017-06-03 DIAGNOSIS — J209 Acute bronchitis, unspecified: Secondary | ICD-10-CM | POA: Diagnosis not present

## 2017-06-03 MED ORDER — BENZONATATE 100 MG PO CAPS: 100.0000 mg | ORAL_CAPSULE | Freq: Three times a day (TID) | ORAL | 0 refills | Status: DC | PRN

## 2017-06-03 MED ORDER — HYDROCODONE-ACETAMINOPHEN 7.5-325 MG PO TABS: 1.0000 | ORAL_TABLET | Freq: Four times a day (QID) | ORAL | 0 refills | Status: DC | PRN

## 2017-06-03 NOTE — Patient Instructions (Addendum)
Take the mucinex DM coNTINUE THE INHALERS  WE WILL CALL WITH LAB Results Heart doctor appointment next week F/U  As previous for Physical

## 2017-06-03 NOTE — Progress Notes (Signed)
   Subjective:    Patient ID: Tina [name], female    DOB: 16-Mar-1951, 67 y.o.   MRN: [id number]  Patient presents for Hospital F/U (A Fib)  Pt here for hospital follow up, had had URI symptoms, but became progressively SOB so went to ER. She had chest discomfort with cough and fever. Initially in ED her BP was quite elevated, so she was given metoprolol, but then became hypotensive, given 1 L NS and it improves. Given Neb treamtns steroids in ER. EKG then showed A fib. She was treated for COPD exacerbation Still has has come cough and mucous, hoarse voice.  A fib- Metoprolol was increased to  25mg  twice a day , also started Eliquis  DM- she has appt for Dr. 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 Now off PlAVIX AND ASA since the switch  Continues to have some cough with minimal production, hoarse voice on and off   Review Of Systems:  GEN- denies fatigue, fever, weight loss,weakness, recent illness HEENT- denies eye drainage, change in vision, nasal discharge, CVS- denies chest pain, palpitations RESP- denies SOB,+ cough, wheeze ABD- denies N/V, change in stools, abd pain GU- denies dysuria, hematuria, dribbling, incontinence MSK- denies joint pain, muscle aches, injury Neuro- denies headache, dizziness, syncope, seizure activity       Objective:    BP 136/72   Pulse 82   Temp 98.7 F (37.1 C) (Oral)   Resp 16   Ht 4\' 11"  (1.499 m)   Wt 195 lb (88.5 kg)   SpO2 96%   BMI 39.39 kg/m  GEN- NAD, alert and oriented x3 HEENT- PERRL, EOMI, non injected sclera, pink conjunctiva, MMM, oropharynx clear, hoarse voice Neck- Supple, no LAD CVS- RRR, no murmur RESP-CTAB ABD-NABS,soft,NT,ND EXT- No edema Pulses- Radial  2+        Assessment & Plan:      Problem List Items Addressed This Visit      Unprioritized   PAF  (paroxysmal atrial fibrillation) (HCC) - Primary    Good control in sinus today on eliquis, has cardiology f/u next week. No current CP or SOB      Relevant Orders   CBC with Differential/Platelet (Completed)   Basic metabolic panel (Completed)   Diabetes mellitus type II, uncontrolled (HCC)    Some improvement in CBG, f/u endocrinone as scheduled         Other Visit Diagnoses    Acute bronchitis, unspecified organism       Given tessalon, inhaler as needed Oxygen sat is good, mucinex BID   Laryngitis       throat lozenges, tea/honey      Note: This dictation was prepared with Dragon dictation along with smaller phrase technology. Any transcriptional errors that result from this process are unintentional.

## 2017-06-04 ENCOUNTER — Encounter: Payer: Self-pay | Admitting: Family Medicine

## 2017-06-04 LAB — CBC WITH DIFFERENTIAL/PLATELET
BASOS ABS: 66 {cells}/uL (ref 0–200)
Basophils Relative: 0.7 %
EOS PCT: 1.3 %
Eosinophils Absolute: 122 cells/uL (ref 15–500)
HEMATOCRIT: 39.4 % (ref 35.0–45.0)
Hemoglobin: 13.4 g/dL (ref 11.7–15.5)
Lymphs Abs: 3215 cells/uL (ref 850–3900)
MCH: 32 pg (ref 27.0–33.0)
MCHC: 34 g/dL (ref 32.0–36.0)
MCV: 94 fL (ref 80.0–100.0)
MONOS PCT: 7.7 %
MPV: 11.3 fL (ref 7.5–12.5)
NEUTROS PCT: 56.1 %
Neutro Abs: 5273 cells/uL (ref 1500–7800)
PLATELETS: 298 10*3/uL (ref 140–400)
RBC: 4.19 10*6/uL (ref 3.80–5.10)
RDW: 13 % (ref 11.0–15.0)
TOTAL LYMPHOCYTE: 34.2 %
WBC mixed population: 724 cells/uL (ref 200–950)
WBC: 9.4 10*3/uL (ref 3.8–10.8)

## 2017-06-04 LAB — BASIC METABOLIC PANEL
BUN: 14 mg/dL (ref 7–25)
CALCIUM: 9 mg/dL (ref 8.6–10.4)
CHLORIDE: 102 mmol/L (ref 98–110)
CO2: 26 mmol/L (ref 20–32)
CREATININE: 0.99 mg/dL (ref 0.50–0.99)
Glucose, Bld: 243 mg/dL — ABNORMAL HIGH (ref 65–99)
POTASSIUM: 4.6 mmol/L (ref 3.5–5.3)
Sodium: 137 mmol/L (ref 135–146)

## 2017-06-04 NOTE — Assessment & Plan Note (Signed)
Good control in sinus today on eliquis, has cardiology f/u next week. No current CP or SOB

## 2017-06-04 NOTE — Assessment & Plan Note (Signed)
Some improvement in CBG, f/u endocrinone as scheduled

## 2017-06-08 ENCOUNTER — Emergency Department (HOSPITAL_COMMUNITY): Payer: Medicare HMO

## 2017-06-08 ENCOUNTER — Emergency Department (HOSPITAL_COMMUNITY)
Admission: EM | Admit: 2017-06-08 | Discharge: 2017-06-08 | Disposition: A | Discharge status: Home / Self Care | Payer: Medicare HMO | Attending: Emergency Medicine | Admitting: Emergency Medicine

## 2017-06-08 ENCOUNTER — Ambulatory Visit: Payer: Medicare Other | Admitting: Neurology

## 2017-06-08 ENCOUNTER — Other Ambulatory Visit: Payer: Self-pay

## 2017-06-08 ENCOUNTER — Encounter (HOSPITAL_COMMUNITY): Payer: Self-pay | Admitting: *Deleted

## 2017-06-08 DIAGNOSIS — R112 Nausea with vomiting, unspecified: Secondary | ICD-10-CM | POA: Insufficient documentation

## 2017-06-08 DIAGNOSIS — I509 Heart failure, unspecified: Secondary | ICD-10-CM | POA: Diagnosis not present

## 2017-06-08 DIAGNOSIS — Z79899 Other long term (current) drug therapy: Secondary | ICD-10-CM | POA: Diagnosis not present

## 2017-06-08 DIAGNOSIS — Z87891 Personal history of nicotine dependence: Secondary | ICD-10-CM | POA: Diagnosis not present

## 2017-06-08 DIAGNOSIS — Z7901 Long term (current) use of anticoagulants: Secondary | ICD-10-CM | POA: Diagnosis not present

## 2017-06-08 DIAGNOSIS — I110 Hypertensive heart disease with heart failure: Secondary | ICD-10-CM | POA: Insufficient documentation

## 2017-06-08 DIAGNOSIS — R4182 Altered mental status, unspecified: Secondary | ICD-10-CM | POA: Insufficient documentation

## 2017-06-08 DIAGNOSIS — E11649 Type 2 diabetes mellitus with hypoglycemia without coma: Secondary | ICD-10-CM | POA: Diagnosis present

## 2017-06-08 DIAGNOSIS — R05 Cough: Secondary | ICD-10-CM | POA: Insufficient documentation

## 2017-06-08 DIAGNOSIS — Z8673 Personal history of transient ischemic attack (TIA), and cerebral infarction without residual deficits: Secondary | ICD-10-CM | POA: Diagnosis not present

## 2017-06-08 DIAGNOSIS — Z794 Long term (current) use of insulin: Secondary | ICD-10-CM | POA: Diagnosis not present

## 2017-06-08 DIAGNOSIS — R0602 Shortness of breath: Secondary | ICD-10-CM | POA: Diagnosis not present

## 2017-06-08 DIAGNOSIS — R531 Weakness: Secondary | ICD-10-CM | POA: Diagnosis not present

## 2017-06-08 DIAGNOSIS — I517 Cardiomegaly: Secondary | ICD-10-CM | POA: Diagnosis not present

## 2017-06-08 DIAGNOSIS — R001 Bradycardia, unspecified: Secondary | ICD-10-CM | POA: Diagnosis not present

## 2017-06-08 LAB — CBC WITH DIFFERENTIAL/PLATELET
BASOS ABS: 0 10*3/uL (ref 0.0–0.1)
BASOS PCT: 0 %
EOS ABS: 0.2 10*3/uL (ref 0.0–0.7)
Eosinophils Relative: 2 %
HCT: 40.1 % (ref 36.0–46.0)
Hemoglobin: 13.5 g/dL (ref 12.0–15.0)
Lymphocytes Relative: 41 %
Lymphs Abs: 3.7 10*3/uL (ref 0.7–4.0)
MCH: 32.6 pg (ref 26.0–34.0)
MCHC: 33.7 g/dL (ref 30.0–36.0)
MCV: 96.9 fL (ref 78.0–100.0)
MONO ABS: 0.6 10*3/uL (ref 0.1–1.0)
MONOS PCT: 7 %
Neutro Abs: 4.6 10*3/uL (ref 1.7–7.7)
Neutrophils Relative %: 50 %
Platelets: 222 10*3/uL (ref 150–400)
RBC: 4.14 MIL/uL (ref 3.87–5.11)
RDW: 13.4 % (ref 11.5–15.5)
WBC: 9.1 10*3/uL (ref 4.0–10.5)

## 2017-06-08 LAB — URINALYSIS, MICROSCOPIC (REFLEX): RBC / HPF: NONE SEEN RBC/hpf (ref 0–5)

## 2017-06-08 LAB — INFLUENZA PANEL BY PCR (TYPE A & B)
INFLAPCR: NEGATIVE
INFLBPCR: NEGATIVE

## 2017-06-08 LAB — I-STAT CG4 LACTIC ACID, ED
Lactic Acid, Venous: 1.36 mmol/L (ref 0.5–1.9)
Lactic Acid, Venous: 1.79 mmol/L (ref 0.5–1.9)

## 2017-06-08 LAB — COMPREHENSIVE METABOLIC PANEL
ALK PHOS: 69 U/L (ref 38–126)
ALT: 18 U/L (ref 14–54)
AST: 20 U/L (ref 15–41)
Albumin: 3.8 g/dL (ref 3.5–5.0)
Anion gap: 11 (ref 5–15)
BILIRUBIN TOTAL: 0.8 mg/dL (ref 0.3–1.2)
BUN: 14 mg/dL (ref 6–20)
CALCIUM: 9.2 mg/dL (ref 8.9–10.3)
CO2: 23 mmol/L (ref 22–32)
CREATININE: 1.06 mg/dL — AB (ref 0.44–1.00)
Chloride: 103 mmol/L (ref 101–111)
GFR calc non Af Amer: 54 mL/min — ABNORMAL LOW (ref >60)
Glucose, Bld: 265 mg/dL — ABNORMAL HIGH (ref 65–99)
Potassium: 4.8 mmol/L (ref 3.5–5.1)
SODIUM: 137 mmol/L (ref 135–145)
TOTAL PROTEIN: 7.2 g/dL (ref 6.5–8.1)

## 2017-06-08 LAB — URINALYSIS, ROUTINE W REFLEX MICROSCOPIC
BILIRUBIN URINE: NEGATIVE
Glucose, UA: >=500 mg/dL — AB
Hgb urine dipstick: NEGATIVE
KETONES UR: NEGATIVE mg/dL
Leukocytes, UA: NEGATIVE
NITRITE: NEGATIVE
Protein, ur: NEGATIVE mg/dL
Specific Gravity, Urine: >1.03 — ABNORMAL HIGH (ref 1.005–1.030)
pH: 5.5 (ref 5.0–8.0)

## 2017-06-08 LAB — I-STAT TROPONIN, ED: TROPONIN I, POC: 0.01 ng/mL (ref 0.00–0.08)

## 2017-06-08 LAB — CBG MONITORING, ED
GLUCOSE-CAPILLARY: 242 mg/dL — AB (ref 65–99)
Glucose-Capillary: 239 mg/dL — ABNORMAL HIGH (ref 65–99)

## 2017-06-08 LAB — BRAIN NATRIURETIC PEPTIDE: B Natriuretic Peptide: 128.7 pg/mL — ABNORMAL HIGH (ref 0.0–100.0)

## 2017-06-08 LAB — TSH: TSH: 4.142 u[IU]/mL (ref 0.350–4.500)

## 2017-06-08 MED ORDER — SODIUM CHLORIDE 0.9 % IV SOLN
INTRAVENOUS | Status: DC
  Administered 2017-06-08: 10:00:00 via INTRAVENOUS

## 2017-06-08 MED ORDER — SODIUM CHLORIDE 0.9 % IV BOLUS (SEPSIS): 1000.0000 mL | Freq: Once | INTRAVENOUS | Status: DC

## 2017-06-08 NOTE — ED Provider Notes (Signed)
[facility] Provider Note   CSN: [id number] Arrival date & time: 06/08/17  0758     History   Chief Complaint Chief Complaint  Patient presents with  . Hypoglycemia  . Altered Mental Status    HPI Tina [name] is a 67 y.o. female.  67 year old female presents with sudden onset of diffuse whole body weakness which began this morning.  Patient had some nausea with emesis x1.  Went to her doctor's office who thought she may have been hypoglycemic.  Blood sugar in the office was over 200.  She then became diaphoretic and weakness worsened but remained nonfocal.  No reported fever but some productive cough.  She notes some unsteadiness on her gait due to the weakness.  Denies any urinary symptoms.  No new medications recently started.  Denies any chest or abdominal discomfort.  Was sent to the ED for further evaluation.      Past Medical History:  Diagnosis Date  . CHF (congestive heart failure) (HCC)   . Depression   . Diabetes mellitus   . Fibromyalgia   . GERD (gastroesophageal reflux disease)   . Hyperlipidemia   . Hypertension   . Stroke (HCC)   . Superficial thrombophlebitis    right leg    Patient Active Problem List   Diagnosis Date Noted  . PAF (paroxysmal atrial fibrillation) (HCC) 05/24/2017  . Hypokalemia 05/24/2017  . 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 Seasonal allergies 09/22/2015  . 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 Yeast infection 11/12/2014  . Skin infection 11/12/2014  . Carotid artery stenosis 08/09/2014  . Bladder prolapse, female, acquired 07/15/2014  . Urinary incontinence 07/15/2014  . Bradycardia 04/03/2014  . Bunion of great toe 05/11/2013  . Anxiety state, unspecified 09/17/2012  . Pain in joint, lower leg 09/17/2012  . Obstructive sleep apnea 07/21/2012  . SOB (shortness of breath) 12/06/2011  . Leg edema 12/06/2011  . CHF (congestive heart failure) (HCC) 12/06/2011  .  GERD (gastroesophageal reflux disease)   . Vertigo 12/01/2011  . Fall 12/01/2011  . Depression 11/08/2011  . Insomnia 11/08/2011  . Obesity 11/08/2011  . Diabetes mellitus type II, uncontrolled (HCC) 03/03/2007  . Hyperlipidemia 03/03/2007  . Essential hypertension 03/03/2007  . Fibromyalgia 03/03/2007    Past Surgical History:  Procedure Laterality Date  . CHOLECYSTECTOMY  1991  . HERNIA REPAIR  approx 1969  . NECK SURGERY  1998  . TONSILLECTOMY AND ADENOIDECTOMY  1966    OB History    No data available       Home Medications    Prior to Admission medications   Medication Sig Start Date End Date Taking? Authorizing Provider  albuterol (PROVENTIL HFA;VENTOLIN HFA) 108 (90 Base) MCG/ACT inhaler Inhale 2 puffs into the lungs every 6 (six) hours as needed for wheezing or shortness of breath. 05/23/17   Salley Scarleturham, Kawanta F, MD  apixaban (ELIQUIS) 5 MG TABS tablet Take 1 tablet (5 mg total) by mouth 2 (two) times daily. 05/26/17 06/25/17  Johnson, Clanford L, MD  benzonatate (TESSALON) 100 MG capsule Take 1 capsule (100 mg total) by mouth 3 (three) times daily as needed for cough. 06/03/17   [place], Velna HatchetKawanta F, MD  chlorhexidine (HIBICLENS) 4 % external liquid Apply topically daily as needed. 04/07/16   , Velna HatchetKawanta F, MD  clotrimazole-betamethasone (LOTRISONE) cream Apply 1 application topically 2 (two) times daily. 12/15/16   Salley Scarleturham, Kawanta F, MD  fenofibrate 54 MG tablet TAKE 1  TABLET BY MOUTH  DAILY 05/04/17   Salley Scarlet, MD  furosemide (LASIX) 40 MG tablet Take 1 tablet (40 mg total) by mouth 2 (two) times daily as needed for fluid. Take 1 tablet twice a day 05/26/17   Standley Dakins L, MD  gabapentin (NEURONTIN) 300 MG capsule Take 1 capsule (300 mg total) by mouth at bedtime. Patient taking differently: Take 300 mg by mouth 2 (two) times daily as needed (pain).  12/15/16   Salley Scarlet, MD  guaiFENesin (MUCINEX) 600 MG 12 hr tablet Take 1 tablet (600 mg total) by mouth  2 (two) times daily. 05/23/17   Salley Scarlet, MD  HYDROcodone-acetaminophen (NORCO) 7.5-325 MG tablet Take 1 tablet by mouth every 6 (six) hours as needed for moderate pain. 06/03/17   Salley Scarlet, MD  insulin aspart (NOVOLOG) 100 UNIT/ML injection Inject 40 units before breakfast & lunch and 60 units at supper 12/17/16   Romero Belling, MD  insulin detemir (LEVEMIR) 100 UNIT/ML injection Inject 0.55 mLs (55 Units total) into the skin at bedtime. 04/12/16   Romero Belling, MD  ipratropium (ATROVENT) 0.02 % nebulizer solution Take 2.5 mLs (0.5 mg total) by nebulization every 4 (four) hours as needed for wheezing or shortness of breath. 05/27/17   Salley Scarlet, MD  ipratropium (ATROVENT) 0.06 % nasal spray Place 2 sprays into both nostrils 4 (four) times daily as needed for rhinitis. 10/05/16   Salley Scarlet, MD  losartan (COZAAR) 100 MG tablet TAKE 1 TABLET BY MOUTH  DAILY 05/04/17   Salley Scarlet, MD  metoprolol tartrate (LOPRESSOR) 25 MG tablet Take 1 tablet (25 mg total) by mouth 2 (two) times daily. 05/26/17 06/25/17  Johnson, Clanford L, MD  montelukast (SINGULAIR) 10 MG tablet TAKE 1 TABLET BY MOUTH AT  BEDTIME 05/16/17   [place], Velna Hatchet, MD  nystatin (MYCOSTATIN/NYSTOP) powder Apply topically 4 (four) times daily. Patient taking differently: Apply 1 g topically 4 (four) times daily as needed (rash).  12/15/16   [place], Velna Hatchet, MD  Omega-3 Fatty Acids (FISH OIL) 1000 MG CAPS Take 2 capsules (2,000 mg total) by mouth 2 (two) times daily. 04/21/17   Salley Scarlet, MD  pantoprazole (PROTONIX) 40 MG tablet Take 1 tablet (40 mg total) daily by mouth. 04/08/17   [place], Velna Hatchet, MD  PARoxetine (PAXIL) 40 MG tablet TAKE 1 TABLET BY MOUTH  EVERY MORNING 05/04/17   [place], Velna Hatchet, MD  potassium chloride (K-DUR) 10 MEQ tablet Take 1 tablet (10 mEq total) by mouth 2 (two) times daily. Patient taking differently: Take 10 mEq by mouth 2 (two) times daily as needed (when taking  lasix).  06/03/16   Salley Scarlet, MD  rosuvastatin (CRESTOR) 40 MG tablet TAKE 1 TABLET BY MOUTH  EVERY EVENING 05/04/17   Salley Scarlet, MD  TRUEPLUS INSULIN SYRINGE 31G X 5/16" 1 ML MISC USE AS DIRECTED TO INJECT 12/23/14   Salley Scarlet, MD    Family History Family History  Problem Relation Age of Onset  . Hypertension Mother   . Hyperlipidemia Mother   . Heart disease Mother   . Stroke Sister   . Hypertension Sister   . Hyperlipidemia Sister   . Heart disease Sister   . Diabetes Maternal Grandmother   . Diabetes Daughter     Social History Social History   Tobacco Use  . Smoking status: Former Smoker    Types: Cigarettes  . Smokeless tobacco: Never Used  Substance Use Topics  . Alcohol use: No  . Drug use: No     Allergies   Cefuroxime axetil; Fentanyl and related; Niaspan [niacin er]; Sulfur; Vasotec [enalapril]; Welchol [colesevelam hcl]; Lyrica [pregabalin]; and Penicillins   Review of Systems Review of Systems  All other systems reviewed and are negative.    Physical Exam Updated Vital Signs BP (!) 154/65   Pulse (!) 57   Temp 97.6 F (36.4 C) (Oral)   Resp 20   SpO2 97%   Physical Exam  Constitutional: She is oriented to person, place, and time. She appears well-developed and well-nourished.  Non-toxic appearance. No distress.  HENT:  Head: Normocephalic and atraumatic.  Eyes: Conjunctivae, EOM and lids are normal. Pupils are equal, round, and reactive to light.  Neck: Normal range of motion. Neck supple. No tracheal deviation present. No thyroid mass present.  Cardiovascular: Normal rate, regular rhythm and normal heart sounds. Exam reveals no gallop.  No murmur heard. Pulmonary/Chest: Effort normal and breath sounds normal. No stridor. No respiratory distress. She has no decreased breath sounds. She has no wheezes. She has no rhonchi. She has no rales.  Abdominal: Soft. Normal appearance and bowel sounds are normal. She exhibits no  distension. There is no tenderness. There is no rebound and no CVA tenderness.  Musculoskeletal: Normal range of motion. She exhibits no edema or tenderness.  Neurological: She is alert and oriented to person, place, and time. She has normal strength. No cranial nerve deficit or sensory deficit. GCS eye subscore is 4. GCS verbal subscore is 5. GCS motor subscore is 6.  Skin: Skin is warm and dry. No abrasion and no rash noted.  Psychiatric: Her affect is blunt. Her speech is delayed. She is withdrawn.  Nursing note and vitals reviewed.    ED Treatments / Results  Labs (all labs ordered are listed, but only abnormal results are displayed) Labs Reviewed  CBG MONITORING, ED - Abnormal; Notable for the following components:      Result Value   Glucose-Capillary 242 (*)    All other components within normal limits  CBC WITH DIFFERENTIAL/PLATELET  COMPREHENSIVE METABOLIC PANEL  URINALYSIS, ROUTINE W REFLEX MICROSCOPIC  TSH  I-STAT CG4 LACTIC ACID, ED  I-STAT TROPONIN, ED    EKG  EKG Interpretation  Date/Time:  Wednesday June 08 2017 08:08:48 EST Ventricular Rate:  52 PR Interval:  160 QRS Duration: 84 QT Interval:  496 QTC Calculation: 461 R Axis:   58 Text Interpretation:  Sinus bradycardia Otherwise normal ECG Confirmed by Lorre Nick ([id number]) on 06/08/2017 8:27:38 AM       Radiology No results found.  Procedures Procedures (including critical care time)  Medications Ordered in ED Medications  sodium chloride 0.9 % bolus 1,000 mL (not administered)  0.9 %  sodium chloride infusion (not administered)     Initial Impression / Assessment and Plan / ED Course  I have reviewed the triage vital signs and the nursing notes.  Pertinent labs & imaging results that were available during my care of the patient were reviewed by me and considered in my medical decision making (see chart for details).     Patient's workup here including urinalysis flu test thyroid  function, lactate, as well as electrolytes and CBC all without significant abnormality except for mild hyperglycemia to 65.  Patient had no focal neurological deficits concerning for CVA.  During her stay here she now feels much better after receiving IV fluids.  And is requesting to go  home at this time.  Final Clinical Impressions(s) / ED Diagnoses   Final diagnoses:  SOB (shortness of breath)    ED Discharge Orders    None       Lorre Nick, MD 06/08/17 1351

## 2017-06-08 NOTE — ED Triage Notes (Addendum)
Pt just states that "she has been getting sick." per family, she was told to come here today due to drop in cbg and HR. HR 52 at triage. CBG> 200. Family reports pt recently being admitted due to Afib RVR.

## 2017-06-08 NOTE — Progress Notes (Signed)
Pt presented to the office this morning just before 7:30 am, she thought she had an appointment. She stated her BS was low, she was trembling and sweating. She asked for a drink with sugar in it. I gave her an 8oz ginger ale. After approximately 10 minutes I checked her BS, it was 240, her HR was 48-50, O2 95%. I consulted Dr. Everlena CooperJaffe, he recommended she bee seen at the ED ASAP. I transferred Pt downstairs in a wheelchair to her friend Ronald PippinsLeonard Scott, who [facility]

## 2017-06-08 NOTE — ED Notes (Signed)
Patient transported to X-ray 

## 2017-06-09 ENCOUNTER — Encounter: Payer: Self-pay | Admitting: *Deleted

## 2017-06-09 NOTE — Progress Notes (Deleted)
Cardiology Office Note    Date:  06/09/2017   ID:  Tina [name], DOB [birth year], MRN [id number]  PCP:  Salley Scarleturham, Kawanta F, MD  Cardiologist: Dr. Purvis SheffieldKoneswaran  No chief complaint on file.   History of Present Illness:    Tina CanalJudy H [name] is a 67 y.o. female  with past medical history of chronic diastolic CHF, HTN, HLD, Type 2 DM, OSA, prior tobacco use, and recently diagnosed vertebral artery dissection (occurring in 02/2017) who presents to the office today for hospital follow-up.   She was recently admitted to [facility] from 1/1 - 05/26/2017 for evaluation of dyspnea and a productive cough, found to have a COPD exacerbation. Cardiology was consulted as she was found to have new onset atrial fibrillation with RVR.  Initial and cyclic troponin values remained negative and a repeat echocardiogram showed a preserved EF of 65-70% with no regional wall motion abnormalities and only mild MR. She was started on Lopressor 25 mg twice daily for rate control. Due to her elevated CHA2DS2-VASc Score, Aspirin and Plavix were discontinued and she was switched to Eliquis 5 mg BID for anticoagulation.      Past Medical History:  Diagnosis Date  . CHF (congestive heart failure) (HCC)   . Depression   . Diabetes mellitus   . Fibromyalgia   . GERD (gastroesophageal reflux disease)   . Hyperlipidemia   . Hypertension   . Stroke (HCC)   . Superficial thrombophlebitis    right leg    Past Surgical History:  Procedure Laterality Date  . CHOLECYSTECTOMY  1991  . HERNIA REPAIR  approx 1969  . NECK SURGERY  1998  . TONSILLECTOMY AND ADENOIDECTOMY  1966    Current Medications: Outpatient Medications Prior to Visit  Medication Sig Dispense Refill  . albuterol (PROVENTIL HFA;VENTOLIN HFA) 108 (90 Base) MCG/ACT inhaler Inhale 2 puffs into the lungs every 6 (six) hours as needed for wheezing or shortness of breath. 1 Inhaler 0  . apixaban (ELIQUIS) 5 MG TABS tablet Take 1 tablet (5 mg total) by mouth  2 (two) times daily. 60 tablet 0  . benzonatate (TESSALON) 100 MG capsule Take 1 capsule (100 mg total) by mouth 3 (three) times daily as needed for cough. 20 capsule 0  . chlorhexidine (HIBICLENS) 4 % external liquid Apply topically daily as needed. (Patient not taking: Reported on 06/08/2017) 120 mL 0  . clotrimazole-betamethasone (LOTRISONE) cream Apply 1 application topically 2 (two) times daily. (Patient taking differently: Apply 1 application topically daily as needed (itching). ) 60 g 1  . fenofibrate 54 MG tablet TAKE 1 TABLET BY MOUTH  DAILY (Patient taking differently: 54MG  BY MOUTH ONCE DAILY IN THE MORNING) 90 tablet 3  . furosemide (LASIX) 40 MG tablet Take 1 tablet (40 mg total) by mouth 2 (two) times daily as needed for fluid. Take 1 tablet twice a day (Patient taking differently: Take 40 mg by mouth 2 (two) times daily as needed for fluid. )    . gabapentin (NEURONTIN) 300 MG capsule Take 1 capsule (300 mg total) by mouth at bedtime. (Patient taking differently: Take 300 mg by mouth 2 (two) times daily as needed (pain). ) 30 capsule 3  . guaiFENesin (MUCINEX) 600 MG 12 hr tablet Take 1 tablet (600 mg total) by mouth 2 (two) times daily. 60 tablet 3  . HYDROcodone-acetaminophen (NORCO) 7.5-325 MG tablet Take 1 tablet by mouth every 6 (six) hours as needed for moderate pain. 45 tablet 0  . insulin  aspart (NOVOLOG) 100 UNIT/ML injection Inject 40 units before breakfast & lunch and 60 units at supper (Patient taking differently: Inject 40-60 Units into the skin See admin instructions. Inject 40 units subcutaneously twice daily before breakfast & lunch, 60 units subcutaneously at supper) 120 mL 3  . insulin detemir (LEVEMIR) 100 UNIT/ML injection Inject 0.55 mLs (55 Units total) into the skin at bedtime. 60 mL 3  . ipratropium (ATROVENT) 0.02 % nebulizer solution Take 2.5 mLs (0.5 mg total) by nebulization every 4 (four) hours as needed for wheezing or shortness of breath. 75 mL 3  .  ipratropium (ATROVENT) 0.06 % nasal spray Place 2 sprays into both nostrils 4 (four) times daily as needed for rhinitis. 15 mL 3  . losartan (COZAAR) 100 MG tablet TAKE 1 TABLET BY MOUTH  DAILY 90 tablet 3  . metoprolol tartrate (LOPRESSOR) 25 MG tablet Take 1 tablet (25 mg total) by mouth 2 (two) times daily. 60 tablet 0  . montelukast (SINGULAIR) 10 MG tablet TAKE 1 TABLET BY MOUTH AT  BEDTIME (Patient taking differently: 10MG  BY MOUTH ONCE DAILY) 30 tablet 6  . nystatin (MYCOSTATIN/NYSTOP) powder Apply topically 4 (four) times daily. (Patient taking differently: Apply 1 g topically 4 (four) times daily as needed (rash). ) 180 g 2  . Omega-3 Fatty Acids (FISH OIL) 1000 MG CAPS Take 2 capsules (2,000 mg total) by mouth 2 (two) times daily. 360 capsule 3  . pantoprazole (PROTONIX) 40 MG tablet Take 1 tablet (40 mg total) daily by mouth. 90 tablet 3  . PARoxetine (PAXIL) 40 MG tablet TAKE 1 TABLET BY MOUTH  EVERY MORNING (Patient taking differently: 40MG  BY MOUTH ONCE DAILY) 90 tablet 3  . potassium chloride (K-DUR) 10 MEQ tablet Take 1 tablet (10 mEq total) by mouth 2 (two) times daily. (Patient taking differently: Take 10 mEq by mouth 2 (two) times daily as needed (when taking lasix). ) 180 tablet 3  . rosuvastatin (CRESTOR) 40 MG tablet TAKE 1 TABLET BY MOUTH  EVERY EVENING (Patient taking differently: 40MG  BY MOUTH ONCE DAILY IN THE EVENING) 90 tablet 3  . TRUEPLUS INSULIN SYRINGE 31G X 5/16" 1 ML MISC USE AS DIRECTED TO INJECT 100 each 2   No facility-administered medications prior to visit.      Allergies:   Cefuroxime axetil; Fentanyl and related; Niaspan [niacin er]; Sulfa antibiotics; Vasotec [enalapril]; Welchol [colesevelam hcl]; Lyrica [pregabalin]; and Penicillins   Social History   Socioeconomic History  . Marital status: Married    Spouse name: Not on file  . Number of children: Not on file  . Years of education: Not on file  . Highest education level: Not on file  Social Needs   . Financial resource strain: Not on file  . Food insecurity - worry: Not on file  . Food insecurity - inability: Not on file  . Transportation needs - medical: Not on file  . Transportation needs - non-medical: Not on file  Occupational History  . Not on file  Tobacco Use  . Smoking status: Former Smoker    Types: Cigarettes  . Smokeless tobacco: Never Used  Substance and Sexual Activity  . Alcohol use: No  . Drug use: No  . Sexual activity: Not Currently  Other Topics Concern  . Not on file  Social History Narrative  . Not on file     Family History:  The patient's ***family history includes Diabetes in her daughter and maternal grandmother; Heart disease in her mother and  sister; Hyperlipidemia in her mother and sister; Hypertension in her mother and sister; Stroke in her sister.   Review of Systems:   Please see the history of present illness.     General:  No chills, fever, night sweats or weight changes.  Cardiovascular:  No chest pain, dyspnea on exertion, edema, orthopnea, palpitations, paroxysmal nocturnal dyspnea. Dermatological: No rash, lesions/masses Respiratory: No cough, dyspnea Urologic: No hematuria, dysuria Abdominal:   No nausea, vomiting, diarrhea, bright red blood per rectum, melena, or hematemesis Neurologic:  No visual changes, wkns, changes in mental status. All other systems reviewed and are otherwise negative except as noted above.   Physical Exam:    VS:  There were no vitals taken for this visit.   General: Well developed, well nourished,female appearing in no acute distress. Head: Normocephalic, atraumatic, sclera non-icteric, no xanthomas, nares are without discharge.  Neck: No carotid bruits. JVD not elevated.  Lungs: Respirations regular and unlabored, without wheezes or rales.  Heart: ***Regular rate and rhythm. No S3 or S4.  No murmur, no rubs, or gallops appreciated. Abdomen: Soft, non-tender, non-distended with normoactive bowel sounds.  No hepatomegaly. No rebound/guarding. No obvious abdominal masses. Msk:  Strength and tone appear normal for age. No joint deformities or effusions. Extremities: No clubbing or cyanosis. No edema.  Distal pedal pulses are 2+ bilaterally. Neuro: Alert and oriented X 3. Moves all extremities spontaneously. No focal deficits noted. Psych:  Responds to questions appropriately with a normal affect. Skin: No rashes or lesions noted  Wt Readings from Last 3 Encounters:  06/03/17 195 lb (88.5 kg)  05/26/17 205 lb 14.6 oz (93.4 kg)  05/19/17 200 lb 6.4 oz (90.9 kg)        Studies/Labs Reviewed:   EKG:  EKG is*** ordered today.  The ekg ordered today demonstrates ***  Recent Labs: 05/24/2017: Magnesium 2.0 06/08/2017: ALT 18; B Natriuretic Peptide 128.7; BUN 14; Creatinine, Ser 1.06; Hemoglobin 13.5; Platelets 222; Potassium 4.8; Sodium 137; TSH 4.142   Lipid Panel    Component Value Date/Time   CHOL 203 (H) 03/12/2017 0739   TRIG 362 (H) 03/12/2017 0739   HDL 46 03/12/2017 0739   CHOLHDL 4.4 03/12/2017 0739   VLDL 72 (H) 03/12/2017 0739   LDLCALC 85 03/12/2017 0739    Additional studies/ records that were reviewed today include:   Echocardiogram: 05/25/2017 Study Conclusions  - Left ventricle: The cavity size was normal. Wall thickness was   increased in a pattern of moderate LVH. Systolic function was   vigorous. The estimated ejection fraction was in the range of 65%   to 70%. Wall motion was normal; there were no regional wall   motion abnormalities. - Aortic valve: Valve area (VTI): 2.24 cm^2. Valve area (Vmax):   1.87 cm^2. Valve area (Vmean): 1.95 cm^2. - Mitral valve: There was mild regurgitation. - Left atrium: The atrium was mildly dilated. - Atrial septum: No defect or patent foramen ovale was identified. - Pulmonary arteries: Systolic pressure was mildly increased. PA   peak pressure: 39 mm Hg (S). - Technically adequate study.  Assessment:    No diagnosis  found.   Plan:   In order of problems listed above:  1. ***    Medication Adjustments/Labs and Tests Ordered: Current medicines are reviewed at length with the patient today.  Concerns regarding medicines are outlined above.  Medication changes, Labs and Tests ordered today are listed in the Patient Instructions below. There are no Patient Instructions on file for this  visit.   Signed, Ellsworth Lennox, PA-C  06/09/2017 1:40 PM    [facility] [address] [place], [place] [address] Phone: [phone]

## 2017-06-10 ENCOUNTER — Ambulatory Visit: Payer: Medicare HMO | Admitting: Student

## 2017-06-17 NOTE — Progress Notes (Signed)
Cardiology Office Note    Date:  06/20/2017   ID:  DEBORHA [name], DOB November 18, 1950, MRN [id number]  PCP:  Salley Scarlet, MD  Cardiologist: Dr. Purvis Sheffield  Chief Complaint  Patient presents with  . Hospitalization Follow-up    History of Present Illness:    Tina [name] is a 67 y.o. femalewith past medical history of chronic diastolic CHF, HTN, HLD, Type 2 DM, OSA, prior tobacco use, and recently diagnosed vertebral artery dissection (occurring in 02/2017) who presents to the office today for hospital follow-up.   She was recently admitted to [facility] from 1/1 - 05/26/2017 for evaluation of dyspnea and a productive cough, found to have a COPD exacerbation. Cardiology was consulted as she was found to have new onset atrial fibrillation with RVR. Initial and cyclic troponin values remained negative and a repeat echocardiogram showed a preserved EF of 65-70% with no regional wall motion abnormalities and only mild MR. She was started on Lopressor 25 mg twice daily for rate control. Due to her elevated CHA2DS2-VASc Score, Aspirin and Plavix were discontinued and she was switched to Eliquis 5 mg twice daily for anticoagulation.   In talking with the patient today, she reports doing well from a cardiac perspective since her recent hospitalization. She denies any recurrent palpitations or chest pain. No recent orthopnea, PND, or lower extremity edema. On 2-3 occasions, she has experienced dizziness and blurry vision lasting for 5 minute intervals and spontaneously resolving. Denies any associated palpitations or presyncope. Has not checked her glucose or blood pressure when this occurs.   Since her Lopressor was increased during her recent admission, she has noticed worsening dizzy spells when changing positions. She reports good compliance with Eliquis and denies any evidence of active bleeding.   Past Medical History:  Diagnosis Date  . Atrial fibrillation (HCC)    a. diagnosed in  05/2017 --> started on Eliquis for anticoagulation  . CHF (congestive heart failure) (HCC)   . Depression   . Diabetes mellitus   . Fibromyalgia   . GERD (gastroesophageal reflux disease)   . Hyperlipidemia   . Hypertension   . Stroke (HCC)   . Superficial thrombophlebitis    right leg    Past Surgical History:  Procedure Laterality Date  . CHOLECYSTECTOMY  1991  . HERNIA REPAIR  approx 1969  . NECK SURGERY  1998  . TONSILLECTOMY AND ADENOIDECTOMY  1966    Current Medications: Outpatient Medications Prior to Visit  Medication Sig Dispense Refill  . albuterol (PROVENTIL HFA;VENTOLIN HFA) 108 (90 Base) MCG/ACT inhaler Inhale 2 puffs into the lungs every 6 (six) hours as needed for wheezing or shortness of breath. 1 Inhaler 0  . benzonatate (TESSALON) 100 MG capsule Take 1 capsule (100 mg total) by mouth 3 (three) times daily as needed for cough. 20 capsule 0  . clotrimazole-betamethasone (LOTRISONE) cream Apply 1 application topically 2 (two) times daily. (Patient taking differently: Apply 1 application topically daily as needed (itching). ) 60 g 1  . fenofibrate 54 MG tablet TAKE 1 TABLET BY MOUTH  DAILY (Patient taking differently: 54MG  BY MOUTH ONCE DAILY IN THE MORNING) 90 tablet 3  . furosemide (LASIX) 40 MG tablet Take 1 tablet (40 mg total) by mouth 2 (two) times daily as needed for fluid. Take 1 tablet twice a day (Patient taking differently: Take 40 mg by mouth 2 (two) times daily as needed for fluid. )    . gabapentin (NEURONTIN) 300 MG capsule Take 1  capsule (300 mg total) by mouth at bedtime. (Patient taking differently: Take 300 mg by mouth 2 (two) times daily as needed (pain). ) 30 capsule 3  . guaiFENesin (MUCINEX) 600 MG 12 hr tablet Take 1 tablet (600 mg total) by mouth 2 (two) times daily. 60 tablet 3  . HYDROcodone-acetaminophen (NORCO) 7.5-325 MG tablet Take 1 tablet by mouth every 6 (six) hours as needed for moderate pain. 45 tablet 0  . insulin aspart (NOVOLOG) 100  UNIT/ML injection Inject 40 units before breakfast & lunch and 60 units at supper (Patient taking differently: Inject 40-60 Units into the skin See admin instructions. Inject 40 units subcutaneously twice daily before breakfast & lunch, 60 units subcutaneously at supper) 120 mL 3  . insulin detemir (LEVEMIR) 100 UNIT/ML injection Inject 0.55 mLs (55 Units total) into the skin at bedtime. 60 mL 3  . ipratropium (ATROVENT) 0.02 % nebulizer solution Take 2.5 mLs (0.5 mg total) by nebulization every 4 (four) hours as needed for wheezing or shortness of breath. 75 mL 3  . ipratropium (ATROVENT) 0.06 % nasal spray Place 2 sprays into both nostrils 4 (four) times daily as needed for rhinitis. 15 mL 3  . metoprolol tartrate (LOPRESSOR) 25 MG tablet Take 1 tablet (25 mg total) by mouth 2 (two) times daily. 60 tablet 0  . montelukast (SINGULAIR) 10 MG tablet TAKE 1 TABLET BY MOUTH AT  BEDTIME (Patient taking differently: 10MG  BY MOUTH ONCE DAILY) 30 tablet 6  . nystatin (MYCOSTATIN/NYSTOP) powder Apply topically 4 (four) times daily. (Patient taking differently: Apply 1 g topically 4 (four) times daily as needed (rash). ) 180 g 2  . Omega-3 Fatty Acids (FISH OIL) 1000 MG CAPS Take 2 capsules (2,000 mg total) by mouth 2 (two) times daily. 360 capsule 3  . pantoprazole (PROTONIX) 40 MG tablet Take 1 tablet (40 mg total) daily by mouth. 90 tablet 3  . PARoxetine (PAXIL) 40 MG tablet TAKE 1 TABLET BY MOUTH  EVERY MORNING (Patient taking differently: 40MG  BY MOUTH ONCE DAILY) 90 tablet 3  . potassium chloride (K-DUR) 10 MEQ tablet Take 1 tablet (10 mEq total) by mouth 2 (two) times daily. (Patient taking differently: Take 10 mEq by mouth 2 (two) times daily as needed (when taking lasix). ) 180 tablet 3  . rosuvastatin (CRESTOR) 40 MG tablet TAKE 1 TABLET BY MOUTH  EVERY EVENING (Patient taking differently: 40MG  BY MOUTH ONCE DAILY IN THE EVENING) 90 tablet 3  . TRUEPLUS INSULIN SYRINGE 31G X 5/16" 1 ML MISC USE AS  DIRECTED TO INJECT 100 each 2  . apixaban (ELIQUIS) 5 MG TABS tablet Take 1 tablet (5 mg total) by mouth 2 (two) times daily. 60 tablet 0  . losartan (COZAAR) 100 MG tablet TAKE 1 TABLET BY MOUTH  DAILY 90 tablet 3  . chlorhexidine (HIBICLENS) 4 % external liquid Apply topically daily as needed. (Patient not taking: Reported on 06/08/2017) 120 mL 0   No facility-administered medications prior to visit.      Allergies:   Cefuroxime axetil; Fentanyl and related; Niaspan [niacin er]; Sulfa antibiotics; Vasotec [enalapril]; Welchol [colesevelam hcl]; Lyrica [pregabalin]; and Penicillins   Social History   Socioeconomic History  . Marital status: Married    Spouse name: None  . Number of children: None  . Years of education: None  . Highest education level: None  Social Needs  . Financial resource strain: None  . Food insecurity - worry: None  . Food insecurity - inability: None  .  Transportation needs - medical: None  . Transportation needs - non-medical: None  Occupational History  . None  Tobacco Use  . Smoking status: Former Smoker    Types: Cigarettes  . Smokeless tobacco: Never Used  Substance and Sexual Activity  . Alcohol use: No  . Drug use: No  . Sexual activity: Not Currently  Other Topics Concern  . None  Social History Narrative  . None     Family History:  The patient's family history includes Diabetes in her daughter and maternal grandmother; Heart disease in her mother and sister; Hyperlipidemia in her mother and sister; Hypertension in her mother and sister; Stroke in her sister.   Review of Systems:   Please see the history of present illness.     General:  No chills, fever, night sweats or weight changes.  Cardiovascular:  No chest pain, dyspnea on exertion, edema, orthopnea, palpitations, paroxysmal nocturnal dyspnea. Dermatological: No rash, lesions/masses Respiratory: No cough, dyspnea Urologic: No hematuria, dysuria Abdominal:   No nausea, vomiting,  diarrhea, bright red blood per rectum, melena, or hematemesis Neurologic:  No visual changes, wkns, changes in mental status. Positive for intermittent dizziness.   All other systems reviewed and are otherwise negative except as noted above.   Physical Exam:    VS:  BP 106/76   Pulse 94   Ht 4\' 11"  (1.499 m)   Wt 196 lb (88.9 kg)   SpO2 97%   BMI 39.59 kg/m    General: Well developed, well nourished Caucasian female appearing in no acute distress. Head: Normocephalic, atraumatic, sclera non-icteric, no xanthomas, nares are without discharge.  Neck: No carotid bruits. JVD not elevated.  Lungs: Respirations regular and unlabored, without wheezes or rales.  Heart: Irregularly irregular. No S3 or S4.  No murmur, no rubs, or gallops appreciated. Abdomen: Soft, non-tender, non-distended with normoactive bowel sounds. No hepatomegaly. No rebound/guarding. No obvious abdominal masses. Msk:  Strength and tone appear normal for age. No joint deformities or effusions. Extremities: No clubbing or cyanosis. No edema.  Distal pedal pulses are 2+ bilaterally. Neuro: Alert and oriented X 3. Moves all extremities spontaneously. No focal deficits noted. Psych:  Responds to questions appropriately with a normal affect. Skin: No rashes or lesions noted  Wt Readings from Last 3 Encounters:  06/20/17 196 lb (88.9 kg)  06/03/17 195 lb (88.5 kg)  05/26/17 205 lb 14.6 oz (93.4 kg)     Studies/Labs Reviewed:   EKG:  EKG is ordered today.  The ekg ordered today demonstrates atrial fibrillation, HR 106.  Recent Labs: 05/24/2017: Magnesium 2.0 06/08/2017: ALT 18; B Natriuretic Peptide 128.7; BUN 14; Creatinine, Ser 1.06; Hemoglobin 13.5; Platelets 222; Potassium 4.8; Sodium 137; TSH 4.142   Lipid Panel    Component Value Date/Time   CHOL 203 (H) 03/12/2017 0739   TRIG 362 (H) 03/12/2017 0739   HDL 46 03/12/2017 0739   CHOLHDL 4.4 03/12/2017 0739   VLDL 72 (H) 03/12/2017 0739   LDLCALC 85 03/12/2017  0739    Additional studies/ records that were reviewed today include:   Echocardiogram: 05/25/2017 Study Conclusions  - Left ventricle: The cavity size was normal. Wall thickness was   increased in a pattern of moderate LVH. Systolic function was   vigorous. The estimated ejection fraction was in the range of 65%   to 70%. Wall motion was normal; there were no regional wall   motion abnormalities. - Aortic valve: Valve area (VTI): 2.24 cm^2. Valve area (Vmax):   1.87  cm^2. Valve area (Vmean): 1.95 cm^2. - Mitral valve: There was mild regurgitation. - Left atrium: The atrium was mildly dilated. - Atrial septum: No defect or patent foramen ovale was identified. - Pulmonary arteries: Systolic pressure was mildly increased. PA   peak pressure: 39 mm Hg (S). - Technically adequate study.   Assessment:    1. PAF (paroxysmal atrial fibrillation) (HCC)   2. Current use of long term anticoagulation   3. Chronic diastolic heart failure (HCC)   4. Essential hypertension   5. Mixed dyslipidemia      Plan:   In order of problems listed above:  1. Paroxysmal Atrial Fibrillation/ Use of Long-term Anticoagulation - the patient was recently admitted for a COPD exacerbation and found to have new onset atrial fibrillation with RVR. HR improved with initiation of AV nodal blocking agents and she was discharged on Lopressor 25 mg twice daily for rate control. She denies any recurrent palpitations or dyspnea. HR variable from the 80's to low-100's during today's visit.  - will continue on current Lopressor dosing as she reports having worsening episodes of dizziness with recent dose titration which is possibly secondary to episodes of hypotension as BP is soft today. Will reduce Losartan dosing and have her follow HR and BP at home. Consider further titration of Lopressor pending her values.  - she denies any evidence of active bleeding. Continue with Eliquis for anticoagulation.   2. Chronic  Diastolic CHF - denies any recent orthopnea, PND, or lower extremity edema. She does not appear volume overloaded by physical examination.  - continue with PRN Lasix dosing. Sodium and fluid restriction reviewed.   3. HTN - BP is at 106/76 during today's visit. She reports episodes of dizziness occurring with positional changes and has not been using Lasix recently. Reports symptoms worsened with dose titration of her Lopressor so possible these episodes are secondary to orthostatic hypotension. Will continue on Lopressor 25mg  BID and reduce Losartan to 50mg  daily.   4. HLD - FLP in 02/2017 showed total cholesterol of 203, HDL 46, and LDL 85. - remains on Crestor 40mg  daily.   Medication Adjustments/Labs and Tests Ordered: Current medicines are reviewed at length with the patient today.  Concerns regarding medicines are outlined above.  Medication changes, Labs and Tests ordered today are listed in the Patient Instructions below. Patient Instructions  Your physician recommends that you schedule a follow-up appointment in: 3 months with Dr.Koneswaran   DECREASE Losartan to 50 mg, Ms Patrick Jupiter, says you may cut your 100 mg tablets in half  Please check you blood pressure and heart rate at home.You have a BP log to use  I refilled your Eliquis today  No lab work and no tests ordered today.  Thank you for choosing [facility] !         Signed, Ellsworth Lennox, PA-C  06/20/2017 7:34 PM    [facility] [address] [place], [place] [address] Phone: [phone]

## 2017-06-19 ENCOUNTER — Encounter: Payer: Self-pay | Admitting: Student

## 2017-06-20 ENCOUNTER — Encounter: Payer: Self-pay | Admitting: Student

## 2017-06-20 ENCOUNTER — Ambulatory Visit (INDEPENDENT_AMBULATORY_CARE_PROVIDER_SITE_OTHER): Payer: Medicare HMO | Admitting: Student

## 2017-06-20 ENCOUNTER — Encounter: Payer: Medicare Other | Admitting: Family Medicine

## 2017-06-20 VITALS — BP 106/76 | HR 94 | Ht 59.0 in | Wt 196.0 lb

## 2017-06-20 DIAGNOSIS — E782 Mixed hyperlipidemia: Secondary | ICD-10-CM

## 2017-06-20 DIAGNOSIS — Z7901 Long term (current) use of anticoagulants: Secondary | ICD-10-CM | POA: Diagnosis not present

## 2017-06-20 DIAGNOSIS — I480 Paroxysmal atrial fibrillation: Secondary | ICD-10-CM

## 2017-06-20 DIAGNOSIS — I10 Essential (primary) hypertension: Secondary | ICD-10-CM

## 2017-06-20 DIAGNOSIS — I5032 Chronic diastolic (congestive) heart failure: Secondary | ICD-10-CM | POA: Diagnosis not present

## 2017-06-20 MED ORDER — LOSARTAN POTASSIUM 50 MG PO TABS: 50.0000 mg | ORAL_TABLET | Freq: Every day | ORAL | 3 refills | Status: DC

## 2017-06-20 MED ORDER — APIXABAN 5 MG PO TABS: 5.0000 mg | ORAL_TABLET | Freq: Two times a day (BID) | ORAL | 11 refills | Status: DC

## 2017-06-20 NOTE — Progress Notes (Deleted)
Subjective:   Patient presents for Medicare Annual/Subsequent preventive examination.    Pt here for Physical  Since our last visit she was sent to ER from eneurology secondary to SOB, ? Change in mentation, hypoglycemia, HR was down to 50's. In ED given IVF, she was hyperglycemia and sent home as no acute findings Has cardiology appt this evening for her PAF     Review Past Medical/Family/Social: Per EMR   Risk Factors  Current exercise habits: none Dietary issues discussed: YES  Cardiac risk factors: Obesity (BMI >= 30 kg/m2). PAF, DM  Depression Screen  (Note: if answer to either of the following is "Yes", a more complete depression screening is indicated)  Over the past two weeks, have you felt down, depressed or hopeless? No Over the past two weeks, have you felt little interest or pleasure in doing things? No Have you lost interest or pleasure in daily life? No Do you often feel hopeless? No Do you cry easily over simple problems? No   Activities of Daily Living  In your present state of health, do you have any difficulty performing the following activities?:  Driving? No  Managing money? No  Feeding yourself? No  Getting from bed to chair? No  Climbing a flight of stairs? No  Preparing food and eating?: No  Bathing or showering? No  Getting dressed: No  Getting to the toilet? No  Using the toilet:No  Moving around from place to place: No  In the past year have you fallen or had a near fall?:No  Are you sexually active? No  Do you have more than one partner? No   Hearing Difficulties: No  Do you often ask people to speak up or repeat themselves? No  Do you experience ringing or noises in your ears? No Do you have difficulty understanding soft or whispered voices? No  Do you feel that you have a problem with memory? No Do you often misplace items? No  Do you feel safe at home? Yes  Cognitive Testing  Alert? Yes Normal Appearance?Yes  Oriented to person? Yes  Place? Yes  Time? Yes  Recall of three objects? Yes  Can perform simple calculations? Yes  Displays appropriate judgment?Yes  Can read the correct time from a watch face?Yes   List the Names of Other Physician/Practitioners you currently use:  Cardiology, Neurology, Endocrinology   Screening Tests / Date Colonoscopy       DUE              Zostavax dUE Pneumonia- due for prevnar 13 Mammogram  DUE PAP SMEAR Bone Density-  Influenza Vaccine  utd Tetanus/tdap dUE   ROS: GEN- denies fatigue, fever, weight loss,weakness, recent illness HEENT- denies eye drainage, change in vision, nasal discharge, CVS- denies chest pain, palpitations RESP- denies SOB, cough, wheeze ABD- denies N/V, change in stools, abd pain GU- denies dysuria, hematuria, dribbling, incontinence MSK- denies joint pain, muscle aches, injury Neuro- denies headache, dizziness, syncope, seizure activity  PHYSICAL: GEN- NAD, alert and oriented x3 HEENT- PERRL, EOMI, non injected sclera, pink conjunctiva, MMM, oropharynx clear Neck- Supple, no thryomegaly CVS- RRR, no murmur RESP-CTAB ABD-NABS,soft,nt,nd  EXT- No edema Pulses- Radial, DP- 2+    Assessment:    Annual wellness medicare exam   Plan:    During the course of the visit the patient was educated and counseled about appropriate screening and preventive services including:  Screening mammography  Colorectal cancer screening  Bone Density screening   Vaccines- Prevnar 13  given  Shingles vaccine.     Diet review for nutrition referral? Yes ____ Not Indicated __x__  Patient Instructions (the written plan) was given to the patient.  Medicare Attestation  I have personally reviewed:  The patient's medical and social history  Their use of alcohol, tobacco or illicit drugs  Their current medications and supplements  The patient's functional ability including ADLs,fall risks, home safety risks, cognitive, and hearing and visual impairment  Diet  and physical activities  Evidence for depression or mood disorders  The patient's weight, height, BMI, and visual acuity have been recorded in the chart. I have made referrals, counseling, and provided education to the patient based on review of the above and I have provided the patient with a written personalized care plan for preventive services.

## 2017-06-20 NOTE — Patient Instructions (Addendum)
Your physician recommends that you schedule a follow-up appointment in: 3 months with Dr.Koneswaran    DECREASE Losartan to 50 mg, Ms Patrick JupiterStrader PA-C, says you may cut your 100 mg tablets in half   Please check you blood pressure and heart rate at home.You have a BP log to use   I refilled your Eliquis today    No lab work and no tests ordered today.     Thank you for choosing [facility] !

## 2017-06-23 ENCOUNTER — Telehealth: Payer: Self-pay | Admitting: Endocrinology

## 2017-06-23 NOTE — Telephone Encounter (Signed)
please call patient: We have received prescription request.  Ov is overdue.  Are you planning to ret here for f/u?

## 2017-06-23 NOTE — Telephone Encounter (Signed)
I called and left patient VM asking if she was planning to return here since we received a refill request. I asked that she call back to give us that information or schedule f/u.

## 2017-07-01 ENCOUNTER — Telehealth: Payer: Self-pay | Admitting: *Deleted

## 2017-07-01 NOTE — Telephone Encounter (Signed)
Received PA determination for Hydrocodone/ APAP.   PA [id number] approved 05/22/2017- 05/23/2018.

## 2017-07-19 ENCOUNTER — Encounter: Payer: Self-pay | Admitting: Endocrinology

## 2017-07-19 ENCOUNTER — Ambulatory Visit: Payer: Medicare HMO | Admitting: Endocrinology

## 2017-07-19 VITALS — BP 132/84 | HR 61 | Ht 59.0 in | Wt 204.0 lb

## 2017-07-19 DIAGNOSIS — E1165 Type 2 diabetes mellitus with hyperglycemia: Secondary | ICD-10-CM

## 2017-07-19 MED ORDER — BASAGLAR KWIKPEN 100 UNIT/ML ~~LOC~~ SOPN: 120.0000 [IU] | PEN_INJECTOR | SUBCUTANEOUS | 11 refills | Status: DC

## 2017-07-19 NOTE — Patient Instructions (Addendum)
check your blood sugar twice a day.  vary the time of day when you check, between before the 3 meals, and at bedtime.  also check if you have symptoms of your blood sugar being too high or too low.  please keep a record of the readings and bring it to your next appointment here (or you can bring the meter itself).  You can write it on any piece of paper.  please call us sooner if your blood sugar goes below 70, or if you have a lot of readings over 200.  Please change both current insulins to "basalgar," 120 units each morning.  On this type of insulin schedule, you should eat meals on a regular schedule.  If a meal is missed or significantly delayed, your blood sugar could go low.   Please come back for a follow-up appointment in 6 weeks.

## 2017-07-19 NOTE — Progress Notes (Signed)
Subjective:    Patient ID: Tina [name], female    DOB: Jul 09, 1950, 67 y.o.   MRN: [id number]  HPI Pt returns for f/u of diabetes mellitus: DM type: Insulin-requiring type 2 Dx'ed: 2002 Complications: renal insuff Therapy: insulin since 2007 GDM: never DKA: never Severe hypoglycemia: never.   Pancreatitis: never Other: She gets insulins through health dept; she takes multiple daily injections.   Interval history: She has mild hypoglycemia approx once per day.  This usually happens in the afternoon (despite eating lunch, but taking no insulin then), but can happen at any time of day.  Pt says she seldom misses the insulin.  no cbg record, but states cbg's are highest fasting (200's).  She takes novolog 40 units twice a day (just before each breakfast and supper), and levemir 55 units QHS.  No recent steroids.   Past Medical History:  Diagnosis Date  . Atrial fibrillation (HCC)    a. diagnosed in 05/2017 --> started on Eliquis for anticoagulation  . CHF (congestive heart failure) (HCC)   . Depression   . Diabetes mellitus   . Fibromyalgia   . GERD (gastroesophageal reflux disease)   . Hyperlipidemia   . Hypertension   . Stroke (HCC)   . Superficial thrombophlebitis    right leg    Past Surgical History:  Procedure Laterality Date  . CHOLECYSTECTOMY  1991  . HERNIA REPAIR  approx 1969  . NECK SURGERY  1998  . TONSILLECTOMY AND ADENOIDECTOMY  1966    Social History   Socioeconomic History  . Marital status: Married    Spouse name: Not on file  . Number of children: Not on file  . Years of education: Not on file  . Highest education level: Not on file  Social Needs  . Financial resource strain: Not on file  . Food insecurity - worry: Not on file  . Food insecurity - inability: Not on file  . Transportation needs - medical: Not on file  . Transportation needs - non-medical: Not on file  Occupational History  . Not on file  Tobacco Use  . Smoking status: Former  Smoker    Types: Cigarettes  . Smokeless tobacco: Never Used  Substance and Sexual Activity  . Alcohol use: No  . Drug use: No  . Sexual activity: Not Currently  Other Topics Concern  . Not on file  Social History Narrative  . Not on file    Current Outpatient Medications on File Prior to Visit  Medication Sig Dispense Refill  . albuterol (PROVENTIL HFA;VENTOLIN HFA) 108 (90 Base) MCG/ACT inhaler Inhale 2 puffs into the lungs every 6 (six) hours as needed for wheezing or shortness of breath. 1 Inhaler 0  . apixaban (ELIQUIS) 5 MG TABS tablet Take 1 tablet (5 mg total) by mouth 2 (two) times daily. 60 tablet 11  . clotrimazole-betamethasone (LOTRISONE) cream Apply 1 application topically 2 (two) times daily. (Patient taking differently: Apply 1 application topically daily as needed (itching). ) 60 g 1  . fenofibrate 54 MG tablet TAKE 1 TABLET BY MOUTH  DAILY (Patient taking differently: 54MG  BY MOUTH ONCE DAILY IN THE MORNING) 90 tablet 3  . furosemide (LASIX) 40 MG tablet Take 1 tablet (40 mg total) by mouth 2 (two) times daily as needed for fluid. Take 1 tablet twice a day (Patient taking differently: Take 40 mg by mouth 2 (two) times daily as needed for fluid. )    . gabapentin (NEURONTIN) 300 MG capsule Take  1 capsule (300 mg total) by mouth at bedtime. (Patient taking differently: Take 300 mg by mouth 2 (two) times daily as needed (pain). ) 30 capsule 3  . guaiFENesin (MUCINEX) 600 MG 12 hr tablet Take 1 tablet (600 mg total) by mouth 2 (two) times daily. 60 tablet 3  . ipratropium (ATROVENT) 0.02 % nebulizer solution Take 2.5 mLs (0.5 mg total) by nebulization every 4 (four) hours as needed for wheezing or shortness of breath. 75 mL 3  . ipratropium (ATROVENT) 0.06 % nasal spray Place 2 sprays into both nostrils 4 (four) times daily as needed for rhinitis. 15 mL 3  . losartan (COZAAR) 50 MG tablet Take 1 tablet (50 mg total) by mouth daily. 90 tablet 3  . montelukast (SINGULAIR) 10 MG  tablet TAKE 1 TABLET BY MOUTH AT  BEDTIME (Patient taking differently: 10MG  BY MOUTH ONCE DAILY) 30 tablet 6  . nystatin (MYCOSTATIN/NYSTOP) powder Apply topically 4 (four) times daily. (Patient taking differently: Apply 1 g topically 4 (four) times daily as needed (rash). ) 180 g 2  . Omega-3 Fatty Acids (FISH OIL) 1000 MG CAPS Take 2 capsules (2,000 mg total) by mouth 2 (two) times daily. 360 capsule 3  . pantoprazole (PROTONIX) 40 MG tablet Take 1 tablet (40 mg total) daily by mouth. 90 tablet 3  . PARoxetine (PAXIL) 40 MG tablet TAKE 1 TABLET BY MOUTH  EVERY MORNING (Patient taking differently: 40MG  BY MOUTH ONCE DAILY) 90 tablet 3  . potassium chloride (K-DUR) 10 MEQ tablet Take 1 tablet (10 mEq total) by mouth 2 (two) times daily. (Patient taking differently: Take 10 mEq by mouth 2 (two) times daily as needed (when taking lasix). ) 180 tablet 3  . rosuvastatin (CRESTOR) 40 MG tablet TAKE 1 TABLET BY MOUTH  EVERY EVENING (Patient taking differently: 40MG  BY MOUTH ONCE DAILY IN THE EVENING) 90 tablet 3  . TRUEPLUS INSULIN SYRINGE 31G X 5/16" 1 ML MISC USE AS DIRECTED TO INJECT 100 each 2   No current facility-administered medications on file prior to visit.     Allergies  Allergen Reactions  . Cefuroxime Axetil Other (See Comments)    REACTION: unspecified  . Fentanyl And Related Other (See Comments)    Patch: unknown reaction  . Niaspan [Niacin Er] Other (See Comments)    Burning all over  . Sulfa Antibiotics Nausea And Vomiting  . Vasotec [Enalapril] Cough  . Welchol [Colesevelam Hcl] Other (See Comments)    Muscle aches and joint pains  . Lyrica [Pregabalin] Rash    Blistering rash around same time as starting drug  . Penicillins Hives and Rash    Has patient had a PCN reaction causing immediate rash, facial/tongue/throat swelling, SOB or lightheadedness with hypotension: Yes Has patient had a PCN reaction causing severe rash involving mucus membranes or skin necrosis: Yes Has  patient had a PCN reaction that required hospitalization: No Has patient had a PCN reaction occurring within the last 10 years: No If all of the above answers are "NO", then may proceed with Cephalosporin use.     Family History  Problem Relation Age of Onset  . Hypertension Mother   . Hyperlipidemia Mother   . Heart disease Mother   . Stroke Sister   . Hypertension Sister   . Hyperlipidemia Sister   . Heart disease Sister   . Diabetes Maternal Grandmother   . Diabetes Daughter     BP 132/84   Pulse 61   Ht 4\' 11"  (1.499  m)   Wt 204 lb (92.5 kg)   SpO2 96%   BMI 41.20 kg/m    Review of Systems She denies LOC.      Objective:   Physical Exam VITAL SIGNS:  See vs page GENERAL: no distress Pulses: dorsalis pedis intact bilat.   MSK: no deformity of the feet CV: no leg edema Skin:  no ulcer on the feet.  normal color and temp on the feet. Neuro: sensation is intact to touch on the feet.    Lab Results  Component Value Date   HGBA1C 8.7 (H) 05/25/2017   Lab Results  Component Value Date   CREATININE 1.06 (H) 06/08/2017   BUN 14 06/08/2017   NA 137 06/08/2017   K 4.8 06/08/2017   CL 103 06/08/2017   CO2 23 06/08/2017        Assessment & Plan:  Insulin-requiring type 2 DM: worse.  She needs a simpler regimen. Renal insuff: she may need a faster-acting qd insulin, but we'll continue basaglar for now. HTN: well-controlled  Patient Instructions  check your blood sugar twice a day.  vary the time of day when you check, between before the 3 meals, and at bedtime.  also check if you have symptoms of your blood sugar being too high or too low.  please keep a record of the readings and bring it to your next appointment here (or you can bring the meter itself).  You can write it on any piece of paper.  please call us sooner if your blood sugar goes below 70, or if you have a lot of readings over 200.  Please change both current insulins to "basalgar," 120 units each  morning.  On this type of insulin schedule, you should eat meals on a regular schedule.  If a meal is missed or significantly delayed, your blood sugar could go low.   Please come back for a follow-up appointment in 6 weeks.

## 2017-07-20 ENCOUNTER — Ambulatory Visit (INDEPENDENT_AMBULATORY_CARE_PROVIDER_SITE_OTHER): Payer: Medicare HMO | Admitting: Family Medicine

## 2017-07-20 ENCOUNTER — Other Ambulatory Visit: Payer: Self-pay

## 2017-07-20 ENCOUNTER — Encounter: Payer: Self-pay | Admitting: Family Medicine

## 2017-07-20 VITALS — BP 130/78 | HR 58 | Temp 98.3°F | Resp 16 | Ht 59.0 in | Wt 198.0 lb

## 2017-07-20 DIAGNOSIS — R001 Bradycardia, unspecified: Secondary | ICD-10-CM

## 2017-07-20 DIAGNOSIS — W19XXXA Unspecified fall, initial encounter: Secondary | ICD-10-CM | POA: Diagnosis not present

## 2017-07-20 DIAGNOSIS — J4520 Mild intermittent asthma, uncomplicated: Secondary | ICD-10-CM | POA: Diagnosis not present

## 2017-07-20 MED ORDER — PREDNISONE 20 MG PO TABS
20.0000 mg | ORAL_TABLET | Freq: Every day | ORAL | 0 refills | Status: DC
Start: 2017-07-20 — End: 2017-08-17

## 2017-07-20 MED ORDER — BENZONATATE 100 MG PO CAPS: 100.0000 mg | ORAL_CAPSULE | Freq: Three times a day (TID) | ORAL | 0 refills | Status: DC | PRN

## 2017-07-20 MED ORDER — ALBUTEROL SULFATE (2.5 MG/3ML) 0.083% IN NEBU
2.5000 mg | INHALATION_SOLUTION | Freq: Once | RESPIRATORY_TRACT | Status: AC
  Administered 2017-07-20: 2.5 mg via RESPIRATORY_TRACT

## 2017-07-20 MED ORDER — HYDROCODONE-ACETAMINOPHEN 7.5-325 MG PO TABS: 1.0000 | ORAL_TABLET | Freq: Four times a day (QID) | ORAL | 0 refills | Status: DC | PRN

## 2017-07-20 MED ORDER — METOPROLOL TARTRATE 25 MG PO TABS: 12.5000 mg | ORAL_TABLET | Freq: Two times a day (BID) | ORAL | 0 refills | Status: DC

## 2017-07-20 NOTE — Patient Instructions (Addendum)
Take 1/2 tablet of metoprolol twice a day  Keep the losartan the same  User nebulizer three times for 2 days  Take the steroids , use the cough medicine  Pulmonary Function test  F/U as previous

## 2017-07-20 NOTE — Progress Notes (Signed)
   Subjective:    Patient ID: Tina [name], female    DOB: 1951-01-17, 67 y.o.   MRN: [id number]  Patient presents for Generalized Pain (frequent falls) and Tinnitus (states that she has like a noise in her head)   Pt here with difficulty with her equilibrium. Has fallen twice. Hears a ringing and roaring noise and she sees colors sometimes.   Was going down the stairs and became dizzy and she forward hitting her head  2nd fall she was coming up the steps and fell forward, this happened last night.No LOC either time No injury  Blood pressure has been good  Denies any abnormal bleeding     Saw her endocrinologist Dr. Everardo AllEllison yesterday, now on Basaglar     Has some stuffiness, nasal drainage that started this afternoon   She has been using her nebulizer for past 2 days feels tight in her chest, cough non productive, no fever  Review Of Systems:  GEN- denies fatigue, fever, weight loss,weakness, recent illness HEENT- denies eye drainage, change in vision,+ nasal discharge, CVS- denies chest pain, palpitations RESP- denies SOB,+cough, wheeze ABD- denies N/V, change in stools, abd pain GU- denies dysuria, hematuria, dribbling, incontinence MSK- denies joint pain, muscle aches, injury Neuro- denies headache,+dizziness, syncope, seizure activity       Objective:    BP 130/78   Pulse (!) 58   Temp 98.3 F (36.8 C) (Oral)   Resp 16   Ht 4\' 11"  (1.499 m)   Wt 198 lb (89.8 kg)   SpO2 96%   BMI 39.99 kg/m  GEN- NAD, alert and oriented x3 HEENT- PERRL, EOMI, non injected sclera, pink conjunctiva, MMM, oropharynx clear, TM clear no effusion, nares clear, no maxillary sinus tenderness Neck- Supple, no thyromegaly, no bruit CVS- Bradycardia HR 50's, no murmur RESP-bilat wheeze scattered  ABD-NABS,soft,NT,ND NEURO-CNII -XII in tact  EXT- No edema Pulses- Radial,2+        Assessment & Plan:      Problem List Items Addressed This Visit      Unprioritized   Bradycardia -  Primary    2 recent falls, no sign of sinus infection, effusion in ear, no new focal deficits. THough had stroke a couple months ago .Concern about the bradycardia as well as probable adult asthma with her wheezing, cough, though oxygen sat are normal  In office given neb, breathing much improved.  EKG shows bradycardia as well  Difficult to tease out the symptoms Will decrease her metoprolol to 12.5mg  BID Continue the losartan at  25mg  Will see if dizzy balance spells improve    Treat the asthma/bronchitis with her nebs, prednisone low dose at 20mg  due to her diabetes. Will plan for PFT in a few weeks Oxygen sat is normal       Relevant Orders   EKG 12-Lead (Completed)    Other Visit Diagnoses    Mild intermittent asthma without complication       Relevant Medications   predniSONE (DELTASONE) 20 MG tablet   albuterol (PROVENTIL) (2.5 MG/3ML) 0.083% nebulizer solution 2.5 mg (Completed)   Accidental fall, initial encounter          Note: This dictation was prepared with Dragon dictation along with smaller phrase technology. Any transcriptional errors that result from this process are unintentional.

## 2017-07-20 NOTE — Assessment & Plan Note (Signed)
2 recent falls, no sign of sinus infection, effusion in ear, no new focal deficits. THough had stroke a couple months ago .Concern about the bradycardia as well as probable adult asthma with her wheezing, cough, though oxygen sat are normal  In office given neb, breathing much improved.  EKG shows bradycardia as well  Difficult to tease out the symptoms Will decrease her metoprolol to 12.5mg  BID Continue the losartan at  25mg  Will see if dizzy balance spells improve    Treat the asthma/bronchitis with her nebs, prednisone low dose at 20mg  due to her diabetes. Will plan for PFT in a few weeks Oxygen sat is normal

## 2017-07-21 ENCOUNTER — Telehealth: Payer: Self-pay | Admitting: Endocrinology

## 2017-07-21 MED ORDER — BASAGLAR KWIKPEN 100 UNIT/ML ~~LOC~~ SOPN: 120.0000 [IU] | PEN_INJECTOR | SUBCUTANEOUS | 11 refills | Status: DC

## 2017-07-21 NOTE — Telephone Encounter (Signed)
Pt called stating she needs the [employer]Bas[facility]lar [facility] [place]. [facility]. Pt informed to check with them later today.

## 2017-07-22 ENCOUNTER — Telehealth: Payer: Self-pay | Admitting: Endocrinology

## 2017-07-22 NOTE — Telephone Encounter (Signed)
For below note. She would like this sent to   [facility] [address] - [place], [place] - [address]

## 2017-07-22 NOTE — Telephone Encounter (Signed)
Insulin Glargine (BASAGLAR KWIKPEN) 100 UNIT/ML SOPN   Patient needs a new prescription sent in. This one was sent and she can not afford this. She stated they dr said once she knew if she could get this or not to give us a call back so he could prescribe her something she can afford  Please advise  Call was dropped before we confirm the pharmacy she will be using.

## 2017-07-25 NOTE — Telephone Encounter (Signed)
I notice you get meds at the health dept.  Is it that you can't afford any name-brand, or we just need to change to a different name brand?

## 2017-07-25 NOTE — Telephone Encounter (Signed)
Please advise on below  

## 2017-07-26 NOTE — Telephone Encounter (Signed)
LVM for patient to call back to clarify if basaglar was just too expensive because it's a name brand or if that name brand just wasn't covered & I could send in alternative such as lantus.

## 2017-07-28 ENCOUNTER — Encounter (HOSPITAL_COMMUNITY): Payer: Medicare HMO

## 2017-07-28 ENCOUNTER — Ambulatory Visit (HOSPITAL_COMMUNITY): Payer: Medicare HMO

## 2017-08-03 ENCOUNTER — Other Ambulatory Visit: Payer: Self-pay | Admitting: *Deleted

## 2017-08-03 MED ORDER — PAROXETINE HCL 40 MG PO TABS: ORAL_TABLET | ORAL | 3 refills | Status: DC

## 2017-08-03 MED ORDER — FENOFIBRATE 54 MG PO TABS: ORAL_TABLET | ORAL | 3 refills | Status: DC

## 2017-08-03 MED ORDER — MONTELUKAST SODIUM 10 MG PO TABS: ORAL_TABLET | ORAL | 3 refills | Status: DC

## 2017-08-03 MED ORDER — APIXABAN 5 MG PO TABS: 5.0000 mg | ORAL_TABLET | Freq: Two times a day (BID) | ORAL | 3 refills | Status: DC

## 2017-08-03 MED ORDER — PANTOPRAZOLE SODIUM 40 MG PO TBEC: 40.0000 mg | DELAYED_RELEASE_TABLET | Freq: Every day | ORAL | 3 refills | Status: DC

## 2017-08-04 ENCOUNTER — Ambulatory Visit (HOSPITAL_COMMUNITY): Admission: RE | Admit: 2017-08-04 | Payer: Medicare HMO | Source: Ambulatory Visit

## 2017-08-17 ENCOUNTER — Other Ambulatory Visit: Payer: Self-pay

## 2017-08-17 ENCOUNTER — Encounter: Payer: Self-pay | Admitting: Family Medicine

## 2017-08-17 ENCOUNTER — Ambulatory Visit (INDEPENDENT_AMBULATORY_CARE_PROVIDER_SITE_OTHER): Payer: Medicare HMO | Admitting: Family Medicine

## 2017-08-17 VITALS — BP 128/82 | HR 76 | Temp 98.5°F | Resp 18 | Ht 59.0 in | Wt 196.0 lb

## 2017-08-17 DIAGNOSIS — Z1239 Encounter for other screening for malignant neoplasm of breast: Secondary | ICD-10-CM

## 2017-08-17 DIAGNOSIS — Z1231 Encounter for screening mammogram for malignant neoplasm of breast: Secondary | ICD-10-CM | POA: Diagnosis not present

## 2017-08-17 DIAGNOSIS — Z0000 Encounter for general adult medical examination without abnormal findings: Secondary | ICD-10-CM | POA: Diagnosis not present

## 2017-08-17 DIAGNOSIS — Z780 Asymptomatic menopausal state: Secondary | ICD-10-CM | POA: Diagnosis not present

## 2017-08-17 DIAGNOSIS — B958 Unspecified staphylococcus as the cause of diseases classified elsewhere: Secondary | ICD-10-CM | POA: Diagnosis not present

## 2017-08-17 DIAGNOSIS — Z1159 Encounter for screening for other viral diseases: Secondary | ICD-10-CM | POA: Diagnosis not present

## 2017-08-17 DIAGNOSIS — Z23 Encounter for immunization: Secondary | ICD-10-CM

## 2017-08-17 DIAGNOSIS — Z124 Encounter for screening for malignant neoplasm of cervix: Secondary | ICD-10-CM

## 2017-08-17 DIAGNOSIS — E785 Hyperlipidemia, unspecified: Secondary | ICD-10-CM | POA: Diagnosis not present

## 2017-08-17 DIAGNOSIS — L089 Local infection of the skin and subcutaneous tissue, unspecified: Secondary | ICD-10-CM | POA: Diagnosis not present

## 2017-08-17 MED ORDER — HYDROCODONE-ACETAMINOPHEN 7.5-325 MG PO TABS: 1.0000 | ORAL_TABLET | Freq: Four times a day (QID) | ORAL | 0 refills | Status: DC | PRN

## 2017-08-17 MED ORDER — DOXYCYCLINE HYCLATE 100 MG PO TABS: 100.0000 mg | ORAL_TABLET | Freq: Two times a day (BID) | ORAL | 0 refills | Status: DC

## 2017-08-17 MED ORDER — TETANUS-DIPHTH-ACELL PERTUSSIS 5-2.5-18.5 LF-MCG/0.5 IM SUSP: 0.5000 mL | Freq: Once | INTRAMUSCULAR | 0 refills | Status: AC

## 2017-08-17 MED ORDER — MUPIROCIN CALCIUM 2 % EX CREA: 1.0000 "application " | TOPICAL_CREAM | Freq: Two times a day (BID) | CUTANEOUS | 0 refills | Status: DC

## 2017-08-17 MED ORDER — ZOSTER VAC RECOMB ADJUVANTED 50 MCG/0.5ML IM SUSR: 0.5000 mL | Freq: Once | INTRAMUSCULAR | 0 refills | Status: AC

## 2017-08-17 MED ORDER — MUPIROCIN 2 % EX OINT: 1.0000 "application " | TOPICAL_OINTMENT | Freq: Two times a day (BID) | CUTANEOUS | 0 refills | Status: DC

## 2017-08-17 MED ORDER — GABAPENTIN 300 MG PO CAPS: 300.0000 mg | ORAL_CAPSULE | Freq: Every day | ORAL | 3 refills | Status: DC

## 2017-08-17 MED ORDER — TETANUS-DIPHTH-ACELL PERTUSSIS 5-2-15.5 LF-MCG/0.5 IM SUSP: 0.5000 mL | Freq: Once | INTRAMUSCULAR | 0 refills | Status: DC

## 2017-08-17 NOTE — Patient Instructions (Addendum)
Schedule mammogram/Bone Density Referral to dermatology  Take antibiotics We will call with lab results Shingles and TDAP sent to pharmacy F/U 4 months

## 2017-08-17 NOTE — Progress Notes (Signed)
Subjective:   Patient presents for Medicare Annual/Subsequent preventive examination.  Patient here for annual wellness exam.  Endocrinologist is managing her diabetes  At her last visit in February she was having some falls along with bradycardia decrease her metoprolol to 12.5 mg twice a day she was continued on losartan 25 mg  She was also treated for asthma bronchitis she is supposed to have pulmonary function test was a no-show per records  She continues to have recurrent scabs on her face she does pick at her skin.  She has had MRSA infection  Hypertriglyceridemia her last triglycerides were 362 back in October she is on fenofibrate as well as excellent dose of Crestor 40 mg  Chronic pain she has hydrocodone 45 tablets monthly in addition to her gabapentin  She has recurrent sores on her face, history of MRSA, also feels them on her scalp, has some pustules on her abdomen and leg, now dried up .   Review Past Medical/Family/Social:per EMR   Risk Factors  Current exercise habits: little exercise Dietary issues discussed: Yes  Cardiac risk factors: Obesity (BMI >= 30 kg/m2). DM, HTN, CVA, CHF  Depression Screen  (Note: if answer to either of the following is "Yes", a more complete depression screening is indicated)  Over the past two weeks, have you felt down, depressed or hopeless? No Over the past two weeks, have you felt little interest or pleasure in doing things? No Have you lost interest or pleasure in daily life? No Do you often feel hopeless? No Do you cry easily over simple problems? No   Activities of Daily Living  In your present state of health, do you have any difficulty performing the following activities?:  Driving? No  Managing money? No  Feeding yourself? No  Getting from bed to chair? No  Climbing a flight of stairs?yes Preparing food and eating?: No  Bathing or showering? No  Getting dressed: No  Getting to the toilet? No  Using the toilet:No   Moving around from place to place: No  In the past year have you fallen or had a near fall?:yes Are you sexually active? No  Do you have more than one partner? No   Hearing Difficulties:  Do you often ask people to speak up or repeat themselves? No  Do you experience ringing or noises in your ears? yes Do you have difficulty understanding soft or whispered voices? No  Do you feel that you have a problem with memory? yes Do you often misplace items? yes  Do you feel safe at home? Yes  Cognitive Testing  Alert? Yes Normal Appearance?Yes  Oriented to person? Yes Place? Yes  Time? Yes  Recall of three objects? Yes  Can perform simple calculations? Yes  Displays appropriate judgment?Yes  Can read the correct time from a watch face?Yes   List the Names of Other Physician/Practitioners you currently use:  Endocrinology Cardiology Vascular Neurology   Screening Tests / Date Colonoscopy    Overdue                 Zostavax Due Pneumonia- Due for prevnar 13 Mammogram Due Bone Density Due  Tetanus/tdap DUE  ROS: GEN- denies fatigue, fever, weight loss,weakness, recent illness HEENT- denies eye drainage, change in vision, nasal discharge, CVS- denies chest pain, palpitations RESP- denies SOB, cough, wheeze ABD- denies N/V, change in stools, abd pain GU- denies dysuria, hematuria, dribbling, incontinence MSK- denies joint pain, muscle aches, injury Neuro- denies headache, dizziness, syncope, seizure activity  PHYSICAL: Vitals reviewed GEN- NAD, alert and oriented x3 HEENT- PERRL, EOMI, non injected sclera, pink conjunctiva, MMM, oropharynx clear Neck- Supple, no thryomegaly Breast- normal symmetry, no nipple inversion,no nipple drainage, no nodules or lumps felt Nodes- no axillary nodes CVS- irregulary rhythem,normal rate  no murmur RESP-CTAB ABD-NABS GU- normal external genitalia, vaginal mucosa pink and moist, cervix visualized no growth, no blood form os, + discharge,  urine released, no CMT, no ovarian masses, uterus normal size Rectum- external skin tag, normal tone, no stool in vault, FOBT neg  EXT- No edema Pulses- Radial, DP- 2+    Assessment:    Annual wellness medicare exam   Plan:    During the course of the visit the patient was educated and counseled about appropriate screening and preventive services including:  Screening mammography /Bone Density- pt to set up Colorectal cancer screening - pt declines   Shingles vaccine/TDAP. Prescription given to that she can get the vaccine at the pharmacy or Medicare part D.   Recurrent staph infections- given doyxyclione /bactroban  advised not to pick at face,referral to dermatology  DM- per endocrinology   A fib- rate controlled on eliquis  Fasting labs today- Hep C  testing done    Continue f/u with other specialist   Chronic pain- refilled norco   PAP Smear done- if negattive none further, has not had > 5 years    PHQ Score 9- she is on paxil for anxiety/depression, no changes    Diet review for nutrition referral? Yes ____ Not Indicated __x__  Patient Instructions (the written plan) was given to the patient.  Medicare Attestation  I have personally reviewed:  The patient's medical and social history  Their use of alcohol, tobacco or illicit drugs  Their current medications and supplements  The patient's functional ability including ADLs,fall risks, home safety risks, cognitive, and hearing and visual impairment  Diet and physical activities  Evidence for depression or mood disorders  The patient's weight, height, BMI, and visual acuity have been recorded in the chart. I have made referrals, counseling, and provided education to the patient based on review of the above and I have provided the patient with a written personalized care plan for preventive services.

## 2017-08-17 NOTE — Addendum Note (Signed)
Addended by: Phillips OdorSIX, Hoa Deriso H on: 08/17/2017 03:52 PM   Modules accepted: Orders

## 2017-08-17 NOTE — Addendum Note (Signed)
Addended by: Phillips OdorSIX, Noheli Melder H on: 08/17/2017 11:29 AM   Modules accepted: Orders

## 2017-08-18 LAB — CBC WITH DIFFERENTIAL/PLATELET
BASOS ABS: 53 {cells}/uL (ref 0–200)
BASOS PCT: 0.7 %
Eosinophils Absolute: 99 cells/uL (ref 15–500)
Eosinophils Relative: 1.3 %
HEMATOCRIT: 44.5 % (ref 35.0–45.0)
HEMOGLOBIN: 15.2 g/dL (ref 11.7–15.5)
LYMPHS ABS: 2926 {cells}/uL (ref 850–3900)
MCH: 32.4 pg (ref 27.0–33.0)
MCHC: 34.2 g/dL (ref 32.0–36.0)
MCV: 94.9 fL (ref 80.0–100.0)
MONOS PCT: 8.2 %
MPV: 11.9 fL (ref 7.5–12.5)
NEUTROS ABS: 3899 {cells}/uL (ref 1500–7800)
Neutrophils Relative %: 51.3 %
Platelets: 250 10*3/uL (ref 140–400)
RBC: 4.69 10*6/uL (ref 3.80–5.10)
RDW: 13.4 % (ref 11.0–15.0)
Total Lymphocyte: 38.5 %
WBC: 7.6 10*3/uL (ref 3.8–10.8)
WBCMIX: 623 {cells}/uL (ref 200–950)

## 2017-08-18 LAB — COMPREHENSIVE METABOLIC PANEL
AG RATIO: 1.5 (calc) (ref 1.0–2.5)
ALT: 14 U/L (ref 6–29)
AST: 14 U/L (ref 10–35)
Albumin: 4.1 g/dL (ref 3.6–5.1)
Alkaline phosphatase (APISO): 67 U/L (ref 33–130)
BILIRUBIN TOTAL: 0.4 mg/dL (ref 0.2–1.2)
BUN/Creatinine Ratio: 12 (calc) (ref 6–22)
BUN: 13 mg/dL (ref 7–25)
CALCIUM: 8.9 mg/dL (ref 8.6–10.4)
CHLORIDE: 104 mmol/L (ref 98–110)
CO2: 23 mmol/L (ref 20–32)
Creat: 1.06 mg/dL — ABNORMAL HIGH (ref 0.50–0.99)
GLOBULIN: 2.8 g/dL (ref 1.9–3.7)
GLUCOSE: 220 mg/dL — AB (ref 65–99)
Potassium: 4.5 mmol/L (ref 3.5–5.3)
Sodium: 138 mmol/L (ref 135–146)
Total Protein: 6.9 g/dL (ref 6.1–8.1)

## 2017-08-18 LAB — PAP IG W/ RFLX HPV ASCU

## 2017-08-18 LAB — LIPID PANEL
Cholesterol: 224 mg/dL — ABNORMAL HIGH (ref <200)
HDL: 57 mg/dL (ref >50)
LDL Cholesterol (Calc): 121 mg/dL (calc) — ABNORMAL HIGH
NON-HDL CHOLESTEROL (CALC): 167 mg/dL — AB (ref <130)
Total CHOL/HDL Ratio: 3.9 (calc) (ref <5.0)
Triglycerides: 321 mg/dL — ABNORMAL HIGH (ref <150)

## 2017-08-18 LAB — HEPATITIS C ANTIBODY
Hepatitis C Ab: NONREACTIVE
SIGNAL TO CUT-OFF: 0.02 (ref <1.00)

## 2017-08-19 ENCOUNTER — Encounter: Payer: Self-pay | Admitting: *Deleted

## 2017-08-30 ENCOUNTER — Ambulatory Visit: Payer: Medicare HMO | Admitting: Endocrinology

## 2017-08-30 DIAGNOSIS — Z0289 Encounter for other administrative examinations: Secondary | ICD-10-CM

## 2017-09-12 ENCOUNTER — Ambulatory Visit: Payer: Medicare HMO | Admitting: Cardiovascular Disease

## 2017-09-20 ENCOUNTER — Other Ambulatory Visit: Payer: Self-pay | Admitting: Family Medicine

## 2017-09-20 MED ORDER — HYDROCODONE-ACETAMINOPHEN 7.5-325 MG PO TABS: 1.0000 | ORAL_TABLET | Freq: Four times a day (QID) | ORAL | 0 refills | Status: DC | PRN

## 2017-09-20 NOTE — Telephone Encounter (Signed)
Ok to refill??  Last office visit/ refill 08/17/2017.

## 2017-09-20 NOTE — Telephone Encounter (Signed)
Refill on hydrocodone to [facility] [place].

## 2017-09-21 ENCOUNTER — Ambulatory Visit: Payer: Medicare HMO | Admitting: Cardiology

## 2017-09-26 ENCOUNTER — Other Ambulatory Visit: Payer: Self-pay | Admitting: *Deleted

## 2017-09-26 MED ORDER — METOPROLOL TARTRATE 25 MG PO TABS: 12.5000 mg | ORAL_TABLET | Freq: Two times a day (BID) | ORAL | 3 refills | Status: DC

## 2017-10-04 ENCOUNTER — Encounter: Payer: Self-pay | Admitting: Family Medicine

## 2017-10-04 ENCOUNTER — Ambulatory Visit (INDEPENDENT_AMBULATORY_CARE_PROVIDER_SITE_OTHER): Payer: Medicare HMO | Admitting: Family Medicine

## 2017-10-04 ENCOUNTER — Other Ambulatory Visit: Payer: Self-pay

## 2017-10-04 VITALS — BP 126/84 | HR 88 | Temp 98.3°F | Resp 14 | Ht 59.0 in | Wt 199.0 lb

## 2017-10-04 DIAGNOSIS — Z8673 Personal history of transient ischemic attack (TIA), and cerebral infarction without residual deficits: Secondary | ICD-10-CM | POA: Diagnosis not present

## 2017-10-04 DIAGNOSIS — I480 Paroxysmal atrial fibrillation: Secondary | ICD-10-CM

## 2017-10-04 DIAGNOSIS — M791 Myalgia, unspecified site: Secondary | ICD-10-CM

## 2017-10-04 DIAGNOSIS — M797 Fibromyalgia: Secondary | ICD-10-CM | POA: Diagnosis not present

## 2017-10-04 DIAGNOSIS — E785 Hyperlipidemia, unspecified: Secondary | ICD-10-CM

## 2017-10-04 DIAGNOSIS — E1165 Type 2 diabetes mellitus with hyperglycemia: Secondary | ICD-10-CM

## 2017-10-04 NOTE — Patient Instructions (Addendum)
Try the Coenzyme q10 , take with crestor at bedtime Try this for 3 weeks If not better call me June 28th at 3:15pm with Dr. Everardo All

## 2017-10-04 NOTE — Progress Notes (Signed)
Subjective:    Patient ID: Tina [name], female    DOB: 17-Apr-1951, 67 y.o.   MRN: [id number]  Patient presents for Pain (x3 -4 weeks- reports fibro pain has increased)  Pt here due to increased pain with her fibromyalgia dates that her muscles hurt all the time all over.  Her arms and her legs.  Not had any joint swelling. She is on Paxil for depression as well Gabapentin  at bedtime Given Norco 7.5-325mg  #45 tablets a month She is not very active  She thinks that her cholesterol medicine may be making her arms and legs muscles hurt as well.  Want to know if there are any other options.  Diabetes mellitus she has not followed up with her endocrinologist since February her diabetes is uncontrolled.  States that her sugars go up and down she has had a few low sugars.  She missed her last appointment in April  Review Of Systems:  GEN- denies fatigue, fever, weight loss,weakness, recent illness HEENT- denies eye drainage, change in vision, nasal discharge, CVS- denies chest pain, palpitations RESP- denies SOB, cough, wheeze ABD- denies N/V, change in stools, abd pain GU- denies dysuria, hematuria, dribbling, incontinence MSK-+ joint pain,+ muscle aches, injury Neuro- denies headache, dizziness, syncope, seizure activity       Objective:    BP 126/84   Pulse 88   Temp 98.3 F (36.8 C) (Oral)   Resp 14   Ht  (1.499 m)   Wt 199 lb (90.3 kg)   SpO2 98%   BMI 40.19 kg/m  GEN- NAD, alert and oriented x3 HEENT- PERRL, EOMI, non injected sclera, pink conjunctiva, MMM, oropharynx clear Neck- Supple, no thyromegaly CVS- irregular rythem, normal rate , no murmur RESP-CTAB ABD-NABS,soft,NT,ND EXT- No edema Pulses- Radial, DP- 2+        Assessment & Plan:      Problem List Items Addressed This Visit      Unprioritized   Hyperlipidemia   Fibromyalgia - Primary   PAF (paroxysmal atrial fibrillation) (HCC)    She is currently back in A. fib however she is  rate controlled and on her blood thinner she is not symptomatic.      History of CVA (cerebrovascular accident)    History of stroke, vertebral artery dissection  Needs statin drug Discussed trying to enzyme every 10 with her Crestor at night.  She is still unable to tolerate this after a few weeks and we can go to Crestor every other day can see how she does.  I think it is a difficult situation secondary to her chronic pain fibromyalgia or lack of exercise movement which can also make her fatigue to make her muscles ache.  I am not to change her gabapentin or the hydrocodone dosing.  Recommend that she get exercise regularly which will help with all of her comorbidities.      Diabetes mellitus type II, uncontrolled (HCC)    Diabetes is uncontrolled she is not follow-up with endocrinologist I called and made an appointment with her endocrinologist for next month.  We will go ahead and obtain the A1c today she has not had one in quite some time. No change to her insulin dosing The importance of her taking her insulin regularly she often will skip morning dose is because she does not get up until after 12:00      Relevant Orders   Comprehensive metabolic panel   Hemoglobin A1c    Other Visit Diagnoses  Myalgia       I doubt rhabdomyolysis however she is on statin drug multiple comorbidities so we will check a CK level   Relevant Orders   CK      Note: This dictation was prepared with Dragon dictation along with smaller phrase technology. Any transcriptional errors that result from this process are unintentional.

## 2017-10-04 NOTE — Assessment & Plan Note (Signed)
Diabetes is uncontrolled she is not follow-up with endocrinologist I called and made an appointment with her endocrinologist for next month.  We will go ahead and obtain the A1c today she has not had one in quite some time. No change to her insulin dosing The importance of her taking her insulin regularly she often will skip morning dose is because she does not get up until after 12:00

## 2017-10-04 NOTE — Assessment & Plan Note (Signed)
She is currently back in A. fib however she is rate controlled and on her blood thinner she is not symptomatic.

## 2017-10-04 NOTE — Assessment & Plan Note (Signed)
History of stroke, vertebral artery dissection  Needs statin drug Discussed trying to enzyme every 10 with her Crestor at night.  She is still unable to tolerate this after a few weeks and we can go to Crestor every other day can see how she does.  I think it is a difficult situation secondary to her chronic pain fibromyalgia or lack of exercise movement which can also make her fatigue to make her muscles ache.  I am not to change her gabapentin or the hydrocodone dosing.  Recommend that she get exercise regularly which will help with all of her comorbidities.

## 2017-10-05 LAB — HEMOGLOBIN A1C
EAG (MMOL/L): 13 (calc)
Hgb A1c MFr Bld: 9.8 % of total Hgb — ABNORMAL HIGH (ref <5.7)
MEAN PLASMA GLUCOSE: 235 (calc)

## 2017-10-05 LAB — COMPREHENSIVE METABOLIC PANEL
AG Ratio: 1.5 (calc) (ref 1.0–2.5)
ALT: 12 U/L (ref 6–29)
AST: 13 U/L (ref 10–35)
Albumin: 4 g/dL (ref 3.6–5.1)
Alkaline phosphatase (APISO): 72 U/L (ref 33–130)
BUN / CREAT RATIO: 17 (calc) (ref 6–22)
BUN: 18 mg/dL (ref 7–25)
CO2: 27 mmol/L (ref 20–32)
CREATININE: 1.06 mg/dL — AB (ref 0.50–0.99)
Calcium: 9.1 mg/dL (ref 8.6–10.4)
Chloride: 103 mmol/L (ref 98–110)
GLOBULIN: 2.6 g/dL (ref 1.9–3.7)
GLUCOSE: 314 mg/dL — AB (ref 65–99)
Potassium: 4.3 mmol/L (ref 3.5–5.3)
Sodium: 139 mmol/L (ref 135–146)
Total Bilirubin: 0.5 mg/dL (ref 0.2–1.2)
Total Protein: 6.6 g/dL (ref 6.1–8.1)

## 2017-10-05 LAB — CK: CK TOTAL: 54 U/L (ref 29–143)

## 2017-10-07 ENCOUNTER — Ambulatory Visit (HOSPITAL_COMMUNITY): Admission: RE | Admit: 2017-10-07 | Payer: Medicare HMO | Source: Ambulatory Visit

## 2017-10-07 ENCOUNTER — Ambulatory Visit: Payer: Medicare HMO | Admitting: Vascular Surgery

## 2017-10-07 ENCOUNTER — Ambulatory Visit (HOSPITAL_COMMUNITY)
Admission: RE | Admit: 2017-10-07 | Discharge: 2017-10-07 | Disposition: A | Discharge status: Home / Self Care | Payer: Medicare HMO | Source: Ambulatory Visit | Attending: Vascular Surgery | Admitting: Vascular Surgery

## 2017-10-07 DIAGNOSIS — I10 Essential (primary) hypertension: Secondary | ICD-10-CM | POA: Insufficient documentation

## 2017-10-07 DIAGNOSIS — Z87891 Personal history of nicotine dependence: Secondary | ICD-10-CM | POA: Diagnosis not present

## 2017-10-07 DIAGNOSIS — I7774 Dissection of vertebral artery: Secondary | ICD-10-CM | POA: Insufficient documentation

## 2017-10-07 DIAGNOSIS — E785 Hyperlipidemia, unspecified: Secondary | ICD-10-CM | POA: Diagnosis not present

## 2017-10-07 DIAGNOSIS — E119 Type 2 diabetes mellitus without complications: Secondary | ICD-10-CM | POA: Insufficient documentation

## 2017-10-07 DIAGNOSIS — Z8673 Personal history of transient ischemic attack (TIA), and cerebral infarction without residual deficits: Secondary | ICD-10-CM | POA: Diagnosis not present

## 2017-10-10 ENCOUNTER — Encounter: Payer: Self-pay | Admitting: *Deleted

## 2017-10-14 ENCOUNTER — Inpatient Hospital Stay (HOSPITAL_COMMUNITY)
Admission: EM | Admit: 2017-10-14 | Discharge: 2017-10-16 | DRG: 065 | Disposition: A | Discharge status: Home Health | Payer: Medicare HMO | Attending: Internal Medicine | Admitting: Internal Medicine

## 2017-10-14 ENCOUNTER — Ambulatory Visit: Payer: Medicare HMO | Admitting: Vascular Surgery

## 2017-10-14 ENCOUNTER — Emergency Department (HOSPITAL_COMMUNITY): Payer: Medicare HMO

## 2017-10-14 ENCOUNTER — Other Ambulatory Visit: Payer: Self-pay

## 2017-10-14 ENCOUNTER — Ambulatory Visit (INDEPENDENT_AMBULATORY_CARE_PROVIDER_SITE_OTHER): Payer: Medicare HMO | Admitting: Vascular Surgery

## 2017-10-14 ENCOUNTER — Encounter (HOSPITAL_COMMUNITY): Payer: Self-pay | Admitting: Emergency Medicine

## 2017-10-14 ENCOUNTER — Encounter: Payer: Self-pay | Admitting: Vascular Surgery

## 2017-10-14 ENCOUNTER — Inpatient Hospital Stay (HOSPITAL_COMMUNITY): Payer: Medicare HMO

## 2017-10-14 VITALS — BP 140/93 | HR 89 | Resp 18 | Ht 59.0 in | Wt 201.6 lb

## 2017-10-14 DIAGNOSIS — Z8673 Personal history of transient ischemic attack (TIA), and cerebral infarction without residual deficits: Secondary | ICD-10-CM

## 2017-10-14 DIAGNOSIS — I639 Cerebral infarction, unspecified: Secondary | ICD-10-CM

## 2017-10-14 DIAGNOSIS — I6389 Other cerebral infarction: Secondary | ICD-10-CM | POA: Diagnosis not present

## 2017-10-14 DIAGNOSIS — Z8349 Family history of other endocrine, nutritional and metabolic diseases: Secondary | ICD-10-CM

## 2017-10-14 DIAGNOSIS — E7800 Pure hypercholesterolemia, unspecified: Secondary | ICD-10-CM | POA: Diagnosis not present

## 2017-10-14 DIAGNOSIS — R29700 NIHSS score 0: Secondary | ICD-10-CM | POA: Diagnosis not present

## 2017-10-14 DIAGNOSIS — I10 Essential (primary) hypertension: Secondary | ICD-10-CM

## 2017-10-14 DIAGNOSIS — R29810 Facial weakness: Secondary | ICD-10-CM | POA: Diagnosis not present

## 2017-10-14 DIAGNOSIS — Z885 Allergy status to narcotic agent status: Secondary | ICD-10-CM

## 2017-10-14 DIAGNOSIS — Z823 Family history of stroke: Secondary | ICD-10-CM

## 2017-10-14 DIAGNOSIS — Z882 Allergy status to sulfonamides status: Secondary | ICD-10-CM

## 2017-10-14 DIAGNOSIS — I7774 Dissection of vertebral artery: Secondary | ICD-10-CM | POA: Diagnosis present

## 2017-10-14 DIAGNOSIS — I6523 Occlusion and stenosis of bilateral carotid arteries: Secondary | ICD-10-CM | POA: Diagnosis present

## 2017-10-14 DIAGNOSIS — Z794 Long term (current) use of insulin: Secondary | ICD-10-CM | POA: Diagnosis not present

## 2017-10-14 DIAGNOSIS — I5032 Chronic diastolic (congestive) heart failure: Secondary | ICD-10-CM | POA: Diagnosis present

## 2017-10-14 DIAGNOSIS — I110 Hypertensive heart disease with heart failure: Secondary | ICD-10-CM | POA: Diagnosis present

## 2017-10-14 DIAGNOSIS — E1165 Type 2 diabetes mellitus with hyperglycemia: Secondary | ICD-10-CM | POA: Diagnosis not present

## 2017-10-14 DIAGNOSIS — E669 Obesity, unspecified: Secondary | ICD-10-CM | POA: Diagnosis present

## 2017-10-14 DIAGNOSIS — Z7901 Long term (current) use of anticoagulants: Secondary | ICD-10-CM | POA: Diagnosis not present

## 2017-10-14 DIAGNOSIS — I6501 Occlusion and stenosis of right vertebral artery: Secondary | ICD-10-CM | POA: Diagnosis present

## 2017-10-14 DIAGNOSIS — G459 Transient cerebral ischemic attack, unspecified: Secondary | ICD-10-CM

## 2017-10-14 DIAGNOSIS — I480 Paroxysmal atrial fibrillation: Secondary | ICD-10-CM | POA: Diagnosis present

## 2017-10-14 DIAGNOSIS — IMO0002 Reserved for concepts with insufficient information to code with codable children: Secondary | ICD-10-CM | POA: Diagnosis present

## 2017-10-14 DIAGNOSIS — K219 Gastro-esophageal reflux disease without esophagitis: Secondary | ICD-10-CM | POA: Diagnosis present

## 2017-10-14 DIAGNOSIS — Z79891 Long term (current) use of opiate analgesic: Secondary | ICD-10-CM | POA: Diagnosis not present

## 2017-10-14 DIAGNOSIS — Z87891 Personal history of nicotine dependence: Secondary | ICD-10-CM

## 2017-10-14 DIAGNOSIS — J45909 Unspecified asthma, uncomplicated: Secondary | ICD-10-CM | POA: Diagnosis not present

## 2017-10-14 DIAGNOSIS — F329 Major depressive disorder, single episode, unspecified: Secondary | ICD-10-CM | POA: Diagnosis present

## 2017-10-14 DIAGNOSIS — E119 Type 2 diabetes mellitus without complications: Secondary | ICD-10-CM | POA: Diagnosis not present

## 2017-10-14 DIAGNOSIS — R0602 Shortness of breath: Secondary | ICD-10-CM | POA: Diagnosis present

## 2017-10-14 DIAGNOSIS — Z9049 Acquired absence of other specified parts of digestive tract: Secondary | ICD-10-CM | POA: Diagnosis not present

## 2017-10-14 DIAGNOSIS — I4891 Unspecified atrial fibrillation: Secondary | ICD-10-CM | POA: Diagnosis present

## 2017-10-14 DIAGNOSIS — E785 Hyperlipidemia, unspecified: Secondary | ICD-10-CM | POA: Diagnosis not present

## 2017-10-14 DIAGNOSIS — I8390 Asymptomatic varicose veins of unspecified lower extremity: Secondary | ICD-10-CM | POA: Diagnosis present

## 2017-10-14 DIAGNOSIS — F32A Depression, unspecified: Secondary | ICD-10-CM | POA: Diagnosis present

## 2017-10-14 DIAGNOSIS — I6381 Other cerebral infarction due to occlusion or stenosis of small artery: Secondary | ICD-10-CM

## 2017-10-14 DIAGNOSIS — Z6841 Body Mass Index (BMI) 40.0 and over, adult: Secondary | ICD-10-CM | POA: Diagnosis not present

## 2017-10-14 DIAGNOSIS — Z880 Allergy status to penicillin: Secondary | ICD-10-CM | POA: Diagnosis not present

## 2017-10-14 DIAGNOSIS — M797 Fibromyalgia: Secondary | ICD-10-CM | POA: Diagnosis present

## 2017-10-14 DIAGNOSIS — R200 Anesthesia of skin: Secondary | ICD-10-CM | POA: Diagnosis not present

## 2017-10-14 DIAGNOSIS — Z8249 Family history of ischemic heart disease and other diseases of the circulatory system: Secondary | ICD-10-CM

## 2017-10-14 DIAGNOSIS — Z833 Family history of diabetes mellitus: Secondary | ICD-10-CM

## 2017-10-14 DIAGNOSIS — Z79899 Other long term (current) drug therapy: Secondary | ICD-10-CM

## 2017-10-14 DIAGNOSIS — E11649 Type 2 diabetes mellitus with hypoglycemia without coma: Secondary | ICD-10-CM | POA: Diagnosis not present

## 2017-10-14 DIAGNOSIS — R69 Illness, unspecified: Secondary | ICD-10-CM | POA: Diagnosis not present

## 2017-10-14 DIAGNOSIS — Z8672 Personal history of thrombophlebitis: Secondary | ICD-10-CM

## 2017-10-14 LAB — BASIC METABOLIC PANEL
ANION GAP: 8 (ref 5–15)
BUN: 13 mg/dL (ref 6–20)
CALCIUM: 9.6 mg/dL (ref 8.9–10.3)
CO2: 27 mmol/L (ref 22–32)
CREATININE: 0.8 mg/dL (ref 0.44–1.00)
Chloride: 106 mmol/L (ref 101–111)
GFR calc Af Amer: >60 mL/min (ref >60)
GFR calc non Af Amer: >60 mL/min (ref >60)
GLUCOSE: 128 mg/dL — AB (ref 65–99)
Potassium: 4.1 mmol/L (ref 3.5–5.1)
Sodium: 141 mmol/L (ref 135–145)

## 2017-10-14 LAB — CBC
HCT: 40.5 % (ref 36.0–46.0)
HEMOGLOBIN: 13.9 g/dL (ref 12.0–15.0)
MCH: 32.2 pg (ref 26.0–34.0)
MCHC: 34.3 g/dL (ref 30.0–36.0)
MCV: 93.8 fL (ref 78.0–100.0)
Platelets: 245 10*3/uL (ref 150–400)
RBC: 4.32 MIL/uL (ref 3.87–5.11)
RDW: 13 % (ref 11.5–15.5)
WBC: 8.2 10*3/uL (ref 4.0–10.5)

## 2017-10-14 LAB — BRAIN NATRIURETIC PEPTIDE: B Natriuretic Peptide: 424.4 pg/mL — ABNORMAL HIGH (ref 0.0–100.0)

## 2017-10-14 LAB — GLUCOSE, CAPILLARY: GLUCOSE-CAPILLARY: 330 mg/dL — AB (ref 65–99)

## 2017-10-14 LAB — I-STAT TROPONIN, ED: Troponin i, poc: 0.02 ng/mL (ref 0.00–0.08)

## 2017-10-14 LAB — CBG MONITORING, ED: GLUCOSE-CAPILLARY: 88 mg/dL (ref 65–99)

## 2017-10-14 MED ORDER — ALBUTEROL SULFATE (2.5 MG/3ML) 0.083% IN NEBU
2.5000 mg | INHALATION_SOLUTION | Freq: Four times a day (QID) | RESPIRATORY_TRACT | Status: DC | PRN
Start: 2017-10-14 — End: 2017-10-16

## 2017-10-14 MED ORDER — OMEGA-3-ACID ETHYL ESTERS 1 G PO CAPS
2.0000 g | ORAL_CAPSULE | Freq: Two times a day (BID) | ORAL | Status: DC
  Administered 2017-10-14 – 2017-10-16 (×4): 2 g via ORAL
  Filled 2017-10-14 (×4): qty 2

## 2017-10-14 MED ORDER — MONTELUKAST SODIUM 10 MG PO TABS
10.0000 mg | ORAL_TABLET | Freq: Every day | ORAL | Status: DC
  Administered 2017-10-15 – 2017-10-16 (×2): 10 mg via ORAL
  Filled 2017-10-14 (×2): qty 1

## 2017-10-14 MED ORDER — IOPAMIDOL (ISOVUE-370) INJECTION 76%
50.0000 mL | Freq: Once | INTRAVENOUS | Status: AC | PRN
  Administered 2017-10-14: 50 mL via INTRAVENOUS

## 2017-10-14 MED ORDER — TRAMADOL HCL 50 MG PO TABS: 50.0000 mg | ORAL_TABLET | Freq: Four times a day (QID) | ORAL | Status: DC | PRN

## 2017-10-14 MED ORDER — PAROXETINE HCL 20 MG PO TABS
40.0000 mg | ORAL_TABLET | Freq: Every day | ORAL | Status: DC
  Administered 2017-10-15 – 2017-10-16 (×2): 40 mg via ORAL
  Filled 2017-10-14 (×2): qty 2

## 2017-10-14 MED ORDER — DEXTROSE 50 % IV SOLN
25.0000 mL | Freq: Once | INTRAVENOUS | Status: AC
  Administered 2017-10-14: 25 mL via INTRAVENOUS
  Filled 2017-10-14: qty 50

## 2017-10-14 MED ORDER — FENOFIBRATE 54 MG PO TABS
54.0000 mg | ORAL_TABLET | Freq: Every day | ORAL | Status: DC
  Administered 2017-10-15 – 2017-10-16 (×2): 54 mg via ORAL
  Filled 2017-10-14 (×2): qty 1

## 2017-10-14 MED ORDER — ACETAMINOPHEN 325 MG PO TABS: 650.0000 mg | ORAL_TABLET | Freq: Four times a day (QID) | ORAL | Status: DC | PRN

## 2017-10-14 MED ORDER — SODIUM CHLORIDE 0.9% FLUSH: 3.0000 mL | INTRAVENOUS | Status: DC | PRN

## 2017-10-14 MED ORDER — INSULIN ASPART 100 UNIT/ML ~~LOC~~ SOLN
0.0000 [IU] | Freq: Every day | SUBCUTANEOUS | Status: DC
  Administered 2017-10-14: 4 [IU] via SUBCUTANEOUS
  Administered 2017-10-15: 2 [IU] via SUBCUTANEOUS

## 2017-10-14 MED ORDER — ONDANSETRON HCL 4 MG/2ML IJ SOLN: 4.0000 mg | Freq: Four times a day (QID) | INTRAMUSCULAR | Status: DC | PRN

## 2017-10-14 MED ORDER — BISACODYL 10 MG RE SUPP: 10.0000 mg | Freq: Every day | RECTAL | Status: DC | PRN

## 2017-10-14 MED ORDER — ONDANSETRON HCL 4 MG PO TABS: 4.0000 mg | ORAL_TABLET | Freq: Four times a day (QID) | ORAL | Status: DC | PRN

## 2017-10-14 MED ORDER — ROSUVASTATIN CALCIUM 20 MG PO TABS: 40.0000 mg | ORAL_TABLET | Freq: Every evening | ORAL | Status: DC

## 2017-10-14 MED ORDER — ACETAMINOPHEN 650 MG RE SUPP: 650.0000 mg | Freq: Four times a day (QID) | RECTAL | Status: DC | PRN

## 2017-10-14 MED ORDER — LOSARTAN POTASSIUM 50 MG PO TABS
25.0000 mg | ORAL_TABLET | Freq: Every day | ORAL | Status: DC
  Administered 2017-10-15 – 2017-10-16 (×2): 25 mg via ORAL
  Filled 2017-10-14 (×2): qty 1

## 2017-10-14 MED ORDER — FUROSEMIDE 10 MG/ML IJ SOLN
40.0000 mg | Freq: Two times a day (BID) | INTRAMUSCULAR | Status: DC
  Administered 2017-10-14 – 2017-10-15 (×2): 40 mg via INTRAVENOUS
  Filled 2017-10-14 (×2): qty 4

## 2017-10-14 MED ORDER — HYDROCODONE-ACETAMINOPHEN 7.5-325 MG PO TABS
1.0000 | ORAL_TABLET | Freq: Four times a day (QID) | ORAL | Status: DC | PRN
Start: 2017-10-14 — End: 2017-10-16

## 2017-10-14 MED ORDER — INSULIN GLARGINE 100 UNIT/ML ~~LOC~~ SOLN
120.0000 [IU] | Freq: Every day | SUBCUTANEOUS | Status: DC
  Administered 2017-10-15: 120 [IU] via SUBCUTANEOUS
  Filled 2017-10-14 (×3): qty 1.2

## 2017-10-14 MED ORDER — PANTOPRAZOLE SODIUM 40 MG PO TBEC
40.0000 mg | DELAYED_RELEASE_TABLET | Freq: Every day | ORAL | Status: DC
  Administered 2017-10-15 – 2017-10-16 (×2): 40 mg via ORAL
  Filled 2017-10-14 (×2): qty 1

## 2017-10-14 MED ORDER — GABAPENTIN 300 MG PO CAPS
300.0000 mg | ORAL_CAPSULE | Freq: Every day | ORAL | Status: DC
  Administered 2017-10-14 – 2017-10-15 (×2): 300 mg via ORAL
  Filled 2017-10-14 (×2): qty 1

## 2017-10-14 MED ORDER — SODIUM CHLORIDE 0.9% FLUSH
3.0000 mL | Freq: Two times a day (BID) | INTRAVENOUS | Status: DC
  Administered 2017-10-14 – 2017-10-16 (×4): 3 mL via INTRAVENOUS

## 2017-10-14 MED ORDER — IOPAMIDOL (ISOVUE-370) INJECTION 76%
INTRAVENOUS | Status: AC
Start: 2017-10-14 — End: 2017-10-15
  Filled 2017-10-14: qty 50

## 2017-10-14 MED ORDER — IPRATROPIUM BROMIDE 0.02 % IN SOLN: 0.5000 mg | RESPIRATORY_TRACT | Status: DC | PRN

## 2017-10-14 MED ORDER — METOPROLOL TARTRATE 12.5 MG HALF TABLET
12.5000 mg | ORAL_TABLET | Freq: Two times a day (BID) | ORAL | Status: DC
  Administered 2017-10-14 – 2017-10-16 (×4): 12.5 mg via ORAL
  Filled 2017-10-14 (×4): qty 1

## 2017-10-14 MED ORDER — APIXABAN 5 MG PO TABS
5.0000 mg | ORAL_TABLET | Freq: Two times a day (BID) | ORAL | Status: DC
Start: 2017-10-14 — End: 2017-10-16
  Administered 2017-10-14 – 2017-10-16 (×4): 5 mg via ORAL
  Filled 2017-10-14 (×4): qty 1

## 2017-10-14 MED ORDER — INSULIN ASPART 100 UNIT/ML ~~LOC~~ SOLN
0.0000 [IU] | Freq: Three times a day (TID) | SUBCUTANEOUS | Status: DC
  Administered 2017-10-15 (×3): 5 [IU] via SUBCUTANEOUS
  Administered 2017-10-16: 3 [IU] via SUBCUTANEOUS
  Administered 2017-10-16: 2 [IU] via SUBCUTANEOUS

## 2017-10-14 MED ORDER — SODIUM CHLORIDE 0.9 % IV SOLN: 250.0000 mL | INTRAVENOUS | Status: DC | PRN

## 2017-10-14 NOTE — Progress Notes (Signed)
Patient ID: Tina [name], female   DOB: [birth year], 67 y.o.   MRN: [id number]  Reason for Consult: Follow-up (6 month f/u carotid )   Referred by Salley Scarlet, MD  Subjective:     HPI:  Tina [name] is a 67 y.o. female he was evaluated last year in November after being admitted with a vertebral artery dissection.  She has known congestive heart failure and also is on Eliquis for atrial fibrillation which she states that she does take.  She is not taking any antiplatelet agents but does take a statin drug.  She states that last week she had some tingling in her left upper extremity as well as her mouth and then again had tingling in her mouth last night.  She does not have any weakness on exam at this time and has no complaints other than some shortness of breath right now.  She had a duplex performed last week which I reviewed.  Past Medical History:  Diagnosis Date  . Atrial fibrillation (HCC)    a. diagnosed in 05/2017 --> started on Eliquis for anticoagulation  . Carotid artery occlusion   . CHF (congestive heart failure) (HCC)   . Depression   . Diabetes mellitus   . Fibromyalgia   . GERD (gastroesophageal reflux disease)   . Hyperlipidemia   . Hypertension   . Stroke (HCC)   . Superficial thrombophlebitis    right leg   Family History  Problem Relation Age of Onset  . Hypertension Mother   . Hyperlipidemia Mother   . Heart disease Mother   . Stroke Sister   . Hypertension Sister   . Hyperlipidemia Sister   . Heart disease Sister   . Diabetes Maternal Grandmother   . Diabetes Daughter    Past Surgical History:  Procedure Laterality Date  . CHOLECYSTECTOMY  1991  . HERNIA REPAIR  approx 1969  . NECK SURGERY  1998  . TONSILLECTOMY AND ADENOIDECTOMY  1966    Short Social History:  Social History   Tobacco Use  . Smoking status: Former Smoker    Types: Cigarettes  . Smokeless tobacco: Never Used  Substance Use Topics  . Alcohol use: No     Allergies  Allergen Reactions  . Cefuroxime Axetil Other (See Comments)    REACTION: unspecified  . Fentanyl And Related Other (See Comments)    Patch: unknown reaction  . Niaspan [Niacin Er] Other (See Comments)    Burning all over  . Sulfa Antibiotics Nausea And Vomiting  . Vasotec [Enalapril] Cough  . Welchol [Colesevelam Hcl] Other (See Comments)    Muscle aches and joint pains  . Lyrica [Pregabalin] Rash    Blistering rash around same time as starting drug  . Penicillins Hives and Rash    Has patient had a PCN reaction causing immediate rash, facial/tongue/throat swelling, SOB or lightheadedness with hypotension: Yes Has patient had a PCN reaction causing severe rash involving mucus membranes or skin necrosis: Yes Has patient had a PCN reaction that required hospitalization: No Has patient had a PCN reaction occurring within the last 10 years: No If all of the above answers are "NO", then may proceed with Cephalosporin use.     Current Outpatient Medications  Medication Sig Dispense Refill  . albuterol (PROVENTIL HFA;VENTOLIN HFA) 108 (90 Base) MCG/ACT inhaler Inhale 2 puffs into the lungs every 6 (six) hours as needed for wheezing or shortness of breath. 1 Inhaler 0  . apixaban (ELIQUIS) 5 MG  TABS tablet Take 1 tablet (5 mg total) by mouth 2 (two) times daily. 180 tablet 3  . benzonatate (TESSALON) 100 MG capsule Take 1 capsule (100 mg total) by mouth 3 (three) times daily as needed for cough. 20 capsule 0  . clotrimazole-betamethasone (LOTRISONE) cream Apply 1 application topically 2 (two) times daily. (Patient taking differently: Apply 1 application topically daily as needed (itching). ) 60 g 1  . doxycycline (VIBRA-TABS) 100 MG tablet Take 1 tablet (100 mg total) by mouth 2 (two) times daily. 14 tablet 0  . fenofibrate 54 MG tablet  BY MOUTH ONCE DAILY IN THE MORNING 90 tablet 3  . furosemide (LASIX) 40 MG tablet Take 1 tablet (40 mg total) by mouth 2 (two) times  daily as needed for fluid. Take 1 tablet twice a day (Patient taking differently: Take 40 mg by mouth 2 (two) times daily as needed for fluid. )    . gabapentin (NEURONTIN) 300 MG capsule Take 1 capsule (300 mg total) by mouth at bedtime. 30 capsule 3  . HYDROcodone-acetaminophen (NORCO) 7.5-325 MG tablet Take 1 tablet by mouth every 6 (six) hours as needed for moderate pain. 45 tablet 0  . Insulin Glargine (BASAGLAR KWIKPEN) 100 UNIT/ML SOPN Inject 1.2 mLs (120 Units total) into the skin every morning. And pen needles 2/day 15 pen 11  . ipratropium (ATROVENT) 0.02 % nebulizer solution Take 2.5 mLs (0.5 mg total) by nebulization every 4 (four) hours as needed for wheezing or shortness of breath. 75 mL 3  . ipratropium (ATROVENT) 0.06 % nasal spray Place 2 sprays into both nostrils 4 (four) times daily as needed for rhinitis. 15 mL 3  . metoprolol tartrate (LOPRESSOR) 25 MG tablet Take 0.5 tablets (12.5 mg total) by mouth 2 (two) times daily. 90 tablet 3  . montelukast (SINGULAIR) 10 MG tablet  BY MOUTH ONCE DAILY 90 tablet 3  . mupirocin ointment (BACTROBAN) 2 % Apply 1 application topically 2 (two) times daily. 22 g 0  . nystatin (MYCOSTATIN/NYSTOP) powder Apply topically 4 (four) times daily. (Patient taking differently: Apply 1 g topically 4 (four) times daily as needed (rash). ) 180 g 2  . Omega-3 Fatty Acids (FISH OIL) 1000 MG CAPS Take 2 capsules (2,000 mg total) by mouth 2 (two) times daily. 360 capsule 3  . pantoprazole (PROTONIX) 40 MG tablet Take 1 tablet (40 mg total) by mouth daily. 90 tablet 3  . PARoxetine (PAXIL) 40 MG tablet  BY MOUTH ONCE DAILY 90 tablet 3  . potassium chloride (K-DUR) 10 MEQ tablet Take 1 tablet (10 mEq total) by mouth 2 (two) times daily. (Patient taking differently: Take 10 mEq by mouth 2 (two) times daily as needed (when taking lasix). ) 180 tablet 3  . rosuvastatin (CRESTOR) 40 MG tablet TAKE 1 TABLET BY MOUTH  EVERY EVENING (Patient taking differently:   BY MOUTH ONCE DAILY IN THE EVENING) 90 tablet 3  . TRUEPLUS INSULIN SYRINGE 31G X 5/16" 1 ML MISC USE AS DIRECTED TO INJECT 100 each 2  . losartan (COZAAR) 50 MG tablet Take 1 tablet (50 mg total) by mouth daily. 90 tablet 3   No current facility-administered medications for this visit.     Review of Systems  Constitutional:  Constitutional negative. HENT: HENT negative.  Eyes: Eyes negative.  Respiratory: Positive for shortness of breath.  Cardiovascular: Positive for irregular heartbeat.  GI: Gastrointestinal negative.  Musculoskeletal: Musculoskeletal negative.  Skin: Skin negative.  Neurological: Positive for numbness.  Hematologic: Hematologic/lymphatic  negative.  Psychiatric: Psychiatric negative.        Objective:  Objective   Vitals:   10/14/17 1011 10/14/17 1014  BP:  (!) 140/93  Resp: 18   SpO2: 95%   Weight: 201 lb 9.6 oz (91.4 kg)   Height:  (1.499 m)    Body mass index is 40.72 kg/m.  Physical Exam  Constitutional: She is oriented to person, place, and time. She appears well-developed.  HENT:  Head: Normocephalic.  Eyes: Pupils are equal, round, and reactive to light.  Neck: Normal range of motion.  Cardiovascular:  She is tachycardic Palpable radial pulses   Pulmonary/Chest: Effort normal.  Abdominal: Soft.  Musculoskeletal: Normal range of motion.  Neurological: She is alert and oriented to person, place, and time. No cranial nerve deficit or sensory deficit. Coordination normal.  Skin: Skin is warm and dry.  Psychiatric: She has a normal mood and affect. Her behavior is normal. Judgment and thought content normal.    Data:   I personally reviewed her carotid duplex which demonstrated peak systolic velocity on the right235 with heterogeneous and calcific plaque 40 to 59% stenosis.  On the left she is 1 to 39% stenosis.     Assessment/Plan:     67 year old female here for follow-up with carotid duplex from a previous vertebral  artery dissection.  On that CT angios she had right-sided internal carotid stenosis approximately 60% with soft plaque.  She has recent duplex demonstrated 40 to 59% stenosis.  Today she gives a history of recent neurologic deficits that are concerning for TIAs.  I recommended her to proceed to the emergency department for emergent evaluation given her previous history of stroke by CT head and known carotid artery stenosis.  My partner Dr. Imogene Burn is on-call for vascular surgery and I will notify him as well.     Maeola Harman MD [facility]

## 2017-10-14 NOTE — ED Notes (Signed)
Patient transported to CT 

## 2017-10-14 NOTE — Progress Notes (Addendum)
Daily Progress Note  Discussed case with Dr. Randie Heinz.  He referred patient to ED for TIA/CVA work-up including neurology consult, MRI Brain, and CTA Neck.  - Awaiting results and Neuro consult  Leonides Sake, MD, FACS [facility] Office: [phone] Pager: [phone]  10/14/2017, 1:03 PM   Addendum  Radiology     Ct Angio Head W Or Wo Contrast  Result Date: 10/14/2017 CLINICAL DATA:  Intermittent numbness in the face since last weekend. EXAM: CT ANGIOGRAPHY HEAD AND NECK TECHNIQUE: Multidetector CT imaging of the head and neck was performed using the standard protocol during bolus administration of intravenous contrast. Multiplanar CT image reconstructions and MIPs were obtained to evaluate the vascular anatomy. Carotid stenosis measurements (when applicable) are obtained utilizing NASCET criteria, using the distal internal carotid diameter as the denominator. CONTRAST:  50mL ISOVUE-370 IOPAMIDOL (ISOVUE-370) INJECTION 76% COMPARISON:  CT head earlier today.  CTA head neck 03/10/2017. FINDINGS: CTA NECK FINDINGS Aortic arch: Dolichoectasia.  No great vessel stenosis. Right carotid system: Calcified and noncalcified plaque at the carotid bifurcation extending into the RIGHT ICA. Luminal measurements of 2.8/3.8 proximal/distal correlate with a less than 50% stenosis. No dissection. Left carotid system: Calcified and noncalcified plaque at carotid bifurcation extending into the LEFT ICA. Luminal measurements of 3.1/4.4 proximal/distal correlate with a less than 50% stenosis. No dissection. Vertebral arteries: LEFT vertebral dominant/sole contributor. 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 Skeleton: Cervical spondylosis.  C4-C7 ACDF.  No acute findings. Other neck: No neck masses.  Airway midline. Upper chest: Mild vascular congestion. Review of the MIP images confirms the above findings CTA HEAD FINDINGS Anterior circulation:  No significant stenosis, proximal occlusion, aneurysm, or vascular malformation. Posterior circulation: RIGHT vertebral in the neck and skull base is occluded as noted previously. BILATERAL PICA vessels are patent. There is some retrograde flow from the basilar which opacifies the RIGHT V4 distal most segment. The LEFT vertebral demonstrates circumferential calcification but no flow-limiting stenosis. Basilar artery dolichoectatic but widely patent. No cerebellar branch occlusion. No saccular aneurysm. Venous sinuses: Patent. Anatomic variants: None. Delayed phase: No abnormal enhancement. Review of the MIP images confirms the above findings IMPRESSION: Nonstenotic atheromatous change both carotid bifurcations. 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 No significant intracranial stenosis, dissection, or saccular aneurysm. No abnormal postcontrast enhancement. Electronically Signed   By: Elsie Stain M.D.   On: 10/14/2017 17:04   Dg Chest 2 View  Result Date: 10/14/2017 CLINICAL DATA:  Shortness of breath. Numbness in her face and left arm. EXAM: CHEST - 2 VIEW COMPARISON:  06/08/2017 FINDINGS: The heart size and mediastinal contours are within normal limits. Both lungs are clear. Previous anterior cervical fusion. IMPRESSION: No active cardiopulmonary disease. Electronically Signed   By: Francene Boyers M.D.   On: 10/14/2017 12:12   Ct Head Wo Contrast  Result Date: 10/14/2017 CLINICAL DATA:  Intermittent numbness of the face and left arm since last weekend. Fatigue. EXAM: CT HEAD WITHOUT CONTRAST TECHNIQUE: Contiguous axial images were obtained from the base of the skull through the vertex without intravenous contrast. COMPARISON:  MRI 04/17/2017, head CT 03/10/2017 FINDINGS: Brain: Mild involutional changes of the brain with chronic mild-to-moderate microvascular ischemic change of the periventricular white matter. Chronic bilateral basal ganglial  lacunar infarcts, right more prominent than left. Chronic small right cerebellar infarct. No large vascular territory infarct, hemorrhage or midline shift. No intra-axial mass nor extra-axial fluid collections. Midline fourth ventricle  and basal cisterns. Vascular: Atherosclerotic calcifications of the distal vertebral arteries and carotid siphons bilaterally. No hyperdense vessel sign. Skull: No acute calvarial fracture or suspicious osseous lesions. Sinuses/Orbits: No acute finding. Other: None. IMPRESSION: 1. 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 2. No acute intracranial abnormality. Electronically Signed   By: Tollie Eth M.D.   On: 10/14/2017 14:41   Ct Angio Neck W And/or Wo Contrast  Result Date: 10/14/2017 CLINICAL DATA:  Intermittent numbness in the face since last weekend. EXAM: CT ANGIOGRAPHY HEAD AND NECK TECHNIQUE: Multidetector CT imaging of the head and neck was performed using the standard protocol during bolus administration of intravenous contrast. Multiplanar CT image reconstructions and MIPs were obtained to evaluate the vascular anatomy. Carotid stenosis measurements (when applicable) are obtained utilizing NASCET criteria, using the distal internal carotid diameter as the denominator. CONTRAST:  50mL ISOVUE-370 IOPAMIDOL (ISOVUE-370) INJECTION 76% COMPARISON:  CT head earlier today.  CTA head neck 03/10/2017. FINDINGS: CTA NECK FINDINGS Aortic arch: Dolichoectasia.  No great vessel stenosis. Right carotid system: Calcified and noncalcified plaque at the carotid bifurcation extending into the RIGHT ICA. Luminal measurements of 2.8/3.8 proximal/distal correlate with a less than 50% stenosis. No dissection. Left carotid system: Calcified and noncalcified plaque at carotid bifurcation extending into the LEFT ICA. Luminal measurements of 3.1/4.4 proximal/distal correlate with a less than 50% stenosis. No  dissection. Vertebral arteries: LEFT vertebral dominant/sole contributor. 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 Skeleton: Cervical spondylosis.  C4-C7 ACDF.  No acute findings. Other neck: No neck masses.  Airway midline. Upper chest: Mild vascular congestion. Review of the MIP images confirms the above findings CTA HEAD FINDINGS Anterior circulation: No significant stenosis, proximal occlusion, aneurysm, or vascular malformation. Posterior circulation: RIGHT vertebral in the neck and skull base is occluded as noted previously. BILATERAL PICA vessels are patent. There is some retrograde flow from the basilar which opacifies the RIGHT V4 distal most segment. The LEFT vertebral demonstrates circumferential calcification but no flow-limiting stenosis. Basilar artery dolichoectatic but widely patent. No cerebellar branch occlusion. No saccular aneurysm. Venous sinuses: Patent. Anatomic variants: None. Delayed phase: No abnormal enhancement. Review of the MIP images confirms the above findings IMPRESSION: Nonstenotic atheromatous change both carotid bifurcations. 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 No significant intracranial stenosis, dissection, or saccular aneurysm. No abnormal postcontrast enhancement. Electronically Signed   By: Elsie Stain M.D.   On: 10/14/2017 17:04    R ICA plaque vs thrombus burden looks decreased on today's CTA.    Awaiting's Neurology's input.  If they feel the R ICA might account for this patient's TIA, will proceed with R CEA on Monday after anticoagulation has fully reversed.   Leonides Sake, MD, FACS [facility] [place] Office: [phone] Pager: [phone]  10/14/2017, 6:09 PM

## 2017-10-14 NOTE — ED Triage Notes (Signed)
Patient complains of intermittent numbness in her face and left arm since last weekend. Also reports feeling fatigued starting the same time.

## 2017-10-14 NOTE — Progress Notes (Signed)
Called [facility] ED triage nurse Asher Muir and notified her that this patient was coming over from the office d/t recent TIAs. She voiced understanding of this plan.

## 2017-10-14 NOTE — ED Provider Notes (Signed)
[facility] EMERGENCY DEPARTMENT Provider Note   CSN: [id number] Arrival date & time: 10/14/17  1055     History   Chief Complaint Chief Complaint  Patient presents with  . Numbness    HPI Tina [name] is a 67 y.o. female.  HPI Patient with known right carotid artery stenosis and history of atrial fibrillation.  Presents with 5 days of episodic left-sided lower facial numbness and left hand numbness.  States symptoms normally last about an hour and then resolved.  Last had the symptoms while in the emergency department.  Symptoms are now resolved.  Denies any focal weakness, visual changes or speech changes.  Patient did have some trouble with ambulation during these episodes.  Was seen by her vascular surgeon today and referred to the emergency department for work-up for possible TIA. Patient does endorse generalized weakness for the past few days.  States she is had increased dyspnea with exertion and nonproductive cough.  No fever or chills. Past Medical History:  Diagnosis Date  . Atrial fibrillation (HCC)    a. diagnosed in 05/2017 --> started on Eliquis for anticoagulation  . Carotid artery occlusion   . CHF (congestive heart failure) (HCC)   . Depression   . Diabetes mellitus   . Fibromyalgia   . GERD (gastroesophageal reflux disease)   . Hyperlipidemia   . Hypertension   . Stroke (HCC)   . Superficial thrombophlebitis    right leg    Patient Active Problem List   Diagnosis Date Noted  . TIA (transient ischemic attack) 10/14/2017  . Left sided numbness 10/14/2017  . Diabetes mellitus with hemoglobin A1c goal of 7.0%-8.0% (HCC)   . PAF (paroxysmal atrial fibrillation) (HCC) 05/24/2017  . Hypokalemia 05/24/2017  . 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 Seasonal allergies 09/22/2015  . 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 Yeast infection 11/12/2014  . Skin infection 11/12/2014  . Carotid artery stenosis 08/09/2014  .  Bladder prolapse, female, acquired 07/15/2014  . Urinary incontinence 07/15/2014  . Bradycardia 04/03/2014  . Bunion of great toe 05/11/2013  . Anxiety state, unspecified 09/17/2012  . Pain in joint, lower leg 09/17/2012  . Obstructive sleep apnea 07/21/2012  . SOB (shortness of breath) 12/06/2011  . Leg edema 12/06/2011  . CHF (congestive heart failure) (HCC) 12/06/2011  . GERD (gastroesophageal reflux disease)   . Vertigo 12/01/2011  . Fall 12/01/2011  . Depression 11/08/2011  . Insomnia 11/08/2011  . Obesity 11/08/2011  . Diabetes mellitus type II, uncontrolled (HCC) 03/03/2007  . Hyperlipidemia 03/03/2007  . Essential hypertension 03/03/2007  . Fibromyalgia 03/03/2007    Past Surgical History:  Procedure Laterality Date  . CHOLECYSTECTOMY  1991  . HERNIA REPAIR  approx 1969  . NECK SURGERY  1998  . TONSILLECTOMY AND ADENOIDECTOMY  1966     OB History   None      Home Medications    Prior to Admission medications   Medication Sig Start Date End Date Taking? Authorizing Provider  albuterol (PROVENTIL HFA;VENTOLIN HFA) 108 (90 Base) MCG/ACT inhaler Inhale 2 puffs into the lungs every 6 (six) hours as needed for wheezing or shortness of breath. 05/23/17  Yes [place], Velna Hatchet, MD  apixaban (ELIQUIS) 5 MG TABS tablet Take 1 tablet (5 mg total) by mouth 2 (two) times daily. 08/03/17  Yes [place], Velna Hatchet, MD  benzonatate (TESSALON) 100 MG capsule Take 1 capsule (100 mg total) by mouth 3 (three) times daily as needed  for cough. 07/20/17  Yes [place], Velna Hatchet, MD  clotrimazole-betamethasone (LOTRISONE) cream Apply 1 application topically 2 (two) times daily. Patient taking differently: Apply 1 application topically daily as needed (itching).  12/15/16  Yes [place], Velna Hatchet, MD  fenofibrate 54 MG tablet 54MG  BY MOUTH ONCE DAILY IN THE MORNING Patient taking differently: Take 54 mg by mouth daily.  08/03/17  Yes [place], Velna Hatchet, MD  furosemide (LASIX) 40 MG tablet Take 1  tablet (40 mg total) by mouth 2 (two) times daily as needed for fluid. Take 1 tablet twice a day Patient taking differently: Take 40 mg by mouth 2 (two) times daily as needed for fluid.  05/26/17  Yes Johnson, Clanford L, MD  gabapentin (NEURONTIN) 300 MG capsule Take 1 capsule (300 mg total) by mouth at bedtime. 08/17/17  Yes [place], Velna Hatchet, MD  HYDROcodone-acetaminophen (NORCO) 7.5-325 MG tablet Take 1 tablet by mouth every 6 (six) hours as needed for moderate pain. 09/20/17  Yes [place], Velna Hatchet, MD  Insulin Glargine (BASAGLAR KWIKPEN) 100 UNIT/ML SOPN Inject 1.2 mLs (120 Units total) into the skin every morning. And pen needles 2/day Patient taking differently: Inject 120 Units into the skin daily at 10 pm.  07/21/17  Yes Romero Belling, MD  ipratropium (ATROVENT) 0.02 % nebulizer solution Take 2.5 mLs (0.5 mg total) by nebulization every 4 (four) hours as needed for wheezing or shortness of breath. 05/27/17  Yes [place], Velna Hatchet, MD  ipratropium (ATROVENT) 0.06 % nasal spray Place 2 sprays into both nostrils 4 (four) times daily as needed for rhinitis. 10/05/16  Yes [place], Velna Hatchet, MD  losartan (COZAAR) 50 MG tablet Take 1 tablet (50 mg total) by mouth daily. Patient taking differently: Take 25 mg by mouth daily.  06/20/17 10/14/17 Yes [place], Lennart Pall, PA-C  metoprolol tartrate (LOPRESSOR) 25 MG tablet Take 0.5 tablets (12.5 mg total) by mouth 2 (two) times daily. 09/26/17 09/21/18 Yes [place], Velna Hatchet, MD  montelukast (SINGULAIR) 10 MG tablet 10MG  BY MOUTH ONCE DAILY Patient taking differently: Take 10 mg by mouth daily.  08/03/17  Yes [place], Velna Hatchet, MD  nystatin (MYCOSTATIN/NYSTOP) powder Apply topically 4 (four) times daily. Patient taking differently: Apply 1 g topically 4 (four) times daily as needed (rash).  12/15/16  Yes [place], Velna Hatchet, MD  Omega-3 Fatty Acids (FISH OIL) 1000 MG CAPS Take 2 capsules (2,000 mg total) by mouth 2 (two) times daily. 04/21/17  Yes [place], Velna Hatchet, MD    pantoprazole (PROTONIX) 40 MG tablet Take 1 tablet (40 mg total) by mouth daily. 08/03/17  Yes [place], Velna Hatchet, MD  PARoxetine (PAXIL) 40 MG tablet 40MG  BY MOUTH ONCE DAILY Patient taking differently: Take 40 mg by mouth daily.  08/03/17  Yes [place], Velna Hatchet, MD  potassium chloride (K-DUR) 10 MEQ tablet Take 1 tablet (10 mEq total) by mouth 2 (two) times daily. Patient taking differently: Take 10 mEq by mouth 2 (two) times daily as needed (when taking lasix).  06/03/16  Yes , Velna Hatchet, MD  rosuvastatin (CRESTOR) 40 MG tablet TAKE 1 TABLET BY MOUTH  EVERY EVENING Patient taking differently: 40MG  BY MOUTH ONCE DAILY IN THE EVENING 05/04/17  Yes , Velna Hatchet, MD  TRUEPLUS INSULIN SYRINGE 31G X 5/16" 1 ML MISC USE AS DIRECTED TO INJECT 12/23/14   Salley Scarlet, MD    Family History Family History  Problem Relation Age of Onset  . Hypertension Mother   . Hyperlipidemia Mother   . Heart  disease Mother   . Stroke Sister   . Hypertension Sister   . Hyperlipidemia Sister   . Heart disease Sister   . Diabetes Maternal Grandmother   . Diabetes Daughter     Social History Social History   Tobacco Use  . Smoking status: Former Smoker    Types: Cigarettes  . Smokeless tobacco: Never Used  Substance Use Topics  . Alcohol use: No  . Drug use: No     Allergies   Cefuroxime axetil; Fentanyl and related; Niaspan [niacin er]; Sulfa antibiotics; Vasotec [enalapril]; Welchol [colesevelam hcl]; Lyrica [pregabalin]; and Penicillins   Review of Systems Review of Systems  Constitutional: Positive for fatigue. Negative for chills and fever.  HENT: Negative for trouble swallowing.   Eyes: Negative for visual disturbance.  Respiratory: Positive for cough and shortness of breath. Negative for wheezing.   Cardiovascular: Negative for chest pain, palpitations and leg swelling.  Gastrointestinal: Negative for abdominal pain, constipation, diarrhea, nausea and vomiting.   Genitourinary: Negative for dysuria, flank pain, frequency and hematuria.  Musculoskeletal: Positive for gait problem. Negative for back pain, myalgias and neck pain.  Skin: Negative for rash and wound.  Neurological: Positive for numbness. Negative for dizziness, speech difficulty, weakness, light-headedness and headaches.  Psychiatric/Behavioral: The patient is nervous/anxious.   All other systems reviewed and are negative.    Physical Exam Updated Vital Signs BP (!) 157/97   Pulse (!) 117   Temp 98.2 F (36.8 C)   Resp 19   Ht  (1.499 m)   Wt 91.2 kg (201 lb)   SpO2 94%   BMI 40.60 kg/m   Physical Exam  Constitutional: She is oriented to person, place, and time. She appears well-developed and well-nourished. No distress.  HENT:  Head: Normocephalic and atraumatic.  Mouth/Throat: Oropharynx is clear and moist. No oropharyngeal exudate.  Cranial nerves II through XII grossly intact.  Eyes: Pupils are equal, round, and reactive to light. EOM are normal.  Neck: Normal range of motion. Neck supple. No JVD present.  No Bruits  Cardiovascular:  Tachycardia.  Irregularly irregular.  Pulmonary/Chest: Effort normal and breath sounds normal. No stridor. No respiratory distress. She has no wheezes. She has no rales. She exhibits no tenderness.  Abdominal: Soft. Bowel sounds are normal. There is no tenderness. There is no rebound and no guarding.  Musculoskeletal: Normal range of motion. She exhibits no edema or tenderness.  No midline thoracic or lumbar tenderness.  No calf swelling, asymmetry or tenderness.  Pulses intact.  Lymphadenopathy:    She has no cervical adenopathy.  Neurological: She is alert and oriented to person, place, and time.  Patient is alert and oriented x3 with clear, goal oriented speech. Patient has 5/5 motor in all extremities. Sensation is intact to light touch. Bilateral finger-to-nose is normal with no signs of dysmetria.  Skin: Skin is warm and dry.  No rash noted. She is not diaphoretic. No erythema.  Psychiatric: She has a normal mood and affect. Her behavior is normal.  Nursing note and vitals reviewed.    ED Treatments / Results  Labs (all labs ordered are listed, but only abnormal results are displayed) Labs Reviewed  BASIC METABOLIC PANEL - Abnormal; Notable for the following components:      Result Value   Glucose, Bld 128 (*)    All other components within normal limits  BRAIN NATRIURETIC PEPTIDE - Abnormal; Notable for the following components:   B Natriuretic Peptide 424.4 (*)    All  other components within normal limits  CBC  URINALYSIS, ROUTINE W REFLEX MICROSCOPIC  CBG MONITORING, ED  I-STAT TROPONIN, ED    EKG EKG Interpretation  Date/Time:  Friday Oct 14 2017 11:25:21 EDT Ventricular Rate:  94 PR Interval:    QRS Duration: 82 QT Interval:  372 QTC Calculation: 465 R Axis:   85 Text Interpretation:  Atrial fibrillation Abnormal ECG Confirmed by Loren Racer ([id number]) on 10/14/2017 1:03:43 PM   Radiology Ct Angio Head W Or Wo Contrast  Result Date: 10/14/2017 CLINICAL DATA:  Intermittent numbness in the face since last weekend. EXAM: CT ANGIOGRAPHY HEAD AND NECK TECHNIQUE: Multidetector CT imaging of the head and neck was performed using the standard protocol during bolus administration of intravenous contrast. Multiplanar CT image reconstructions and MIPs were obtained to evaluate the vascular anatomy. Carotid stenosis measurements (when applicable) are obtained utilizing NASCET criteria, using the distal internal carotid diameter as the denominator. CONTRAST:  50mL ISOVUE-370 IOPAMIDOL (ISOVUE-370) INJECTION 76% COMPARISON:  CT head earlier today.  CTA head neck 03/10/2017. FINDINGS: CTA NECK FINDINGS Aortic arch: Dolichoectasia.  No great vessel stenosis. Right carotid system: Calcified and noncalcified plaque at the carotid bifurcation extending into the RIGHT ICA. Luminal measurements of 2.8/3.8  proximal/distal correlate with a less than 50% stenosis. No dissection. Left carotid system: Calcified and noncalcified plaque at carotid bifurcation extending into the LEFT ICA. Luminal measurements of 3.1/4.4 proximal/distal correlate with a less than 50% stenosis. No dissection. Vertebral arteries: LEFT vertebral dominant/sole contributor. 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 Skeleton: Cervical spondylosis.  C4-C7 ACDF.  No acute findings. Other neck: No neck masses.  Airway midline. Upper chest: Mild vascular congestion. Review of the MIP images confirms the above findings CTA HEAD FINDINGS Anterior circulation: No significant stenosis, proximal occlusion, aneurysm, or vascular malformation. Posterior circulation: RIGHT vertebral in the neck and skull base is occluded as noted previously. BILATERAL PICA vessels are patent. There is some retrograde flow from the basilar which opacifies the RIGHT V4 distal most segment. The LEFT vertebral demonstrates circumferential calcification but no flow-limiting stenosis. Basilar artery dolichoectatic but widely patent. No cerebellar branch occlusion. No saccular aneurysm. Venous sinuses: Patent. Anatomic variants: None. Delayed phase: No abnormal enhancement. Review of the MIP images confirms the above findings IMPRESSION: Nonstenotic atheromatous change both carotid bifurcations. 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 No significant intracranial stenosis, dissection, or saccular aneurysm. No abnormal postcontrast enhancement. Electronically Signed   By: Elsie Stain M.D.   On: 10/14/2017 17:04   Dg Chest 2 View  Result Date: 10/14/2017 CLINICAL DATA:  Shortness of breath. Numbness in her face and left arm. EXAM: CHEST - 2 VIEW COMPARISON:  06/08/2017 FINDINGS: The heart size and mediastinal contours are within normal limits. Both lungs are clear. Previous anterior  cervical fusion. IMPRESSION: No active cardiopulmonary disease. Electronically Signed   By: Francene Boyers M.D.   On: 10/14/2017 12:12   Ct Head Wo Contrast  Result Date: 10/14/2017 CLINICAL DATA:  Intermittent numbness of the face and left arm since last weekend. Fatigue. EXAM: CT HEAD WITHOUT CONTRAST TECHNIQUE: Contiguous axial images were obtained from the base of the skull through the vertex without intravenous contrast. COMPARISON:  MRI 04/17/2017, head CT 03/10/2017 FINDINGS: Brain: Mild involutional changes of the brain with chronic mild-to-moderate microvascular ischemic change of the periventricular white matter. Chronic bilateral basal ganglial lacunar infarcts, right more prominent than left. Chronic small right cerebellar infarct. No large  vascular territory infarct, hemorrhage or midline shift. No intra-axial mass nor extra-axial fluid collections. Midline fourth ventricle and basal cisterns. Vascular: Atherosclerotic calcifications of the distal vertebral arteries and carotid siphons bilaterally. No hyperdense vessel sign. Skull: No acute calvarial fracture or suspicious osseous lesions. Sinuses/Orbits: No acute finding. Other: None. IMPRESSION: 1. 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 2. No acute intracranial abnormality. Electronically Signed   By: Tollie Eth M.D.   On: 10/14/2017 14:41   Ct Angio Neck W And/or Wo Contrast  Result Date: 10/14/2017 CLINICAL DATA:  Intermittent numbness in the face since last weekend. EXAM: CT ANGIOGRAPHY HEAD AND NECK TECHNIQUE: Multidetector CT imaging of the head and neck was performed using the standard protocol during bolus administration of intravenous contrast. Multiplanar CT image reconstructions and MIPs were obtained to evaluate the vascular anatomy. Carotid stenosis measurements (when applicable) are obtained utilizing NASCET criteria, using the distal internal  carotid diameter as the denominator. CONTRAST:  50mL ISOVUE-370 IOPAMIDOL (ISOVUE-370) INJECTION 76% COMPARISON:  CT head earlier today.  CTA head neck 03/10/2017. FINDINGS: CTA NECK FINDINGS Aortic arch: Dolichoectasia.  No great vessel stenosis. Right carotid system: Calcified and noncalcified plaque at the carotid bifurcation extending into the RIGHT ICA. Luminal measurements of 2.8/3.8 proximal/distal correlate with a less than 50% stenosis. No dissection. Left carotid system: Calcified and noncalcified plaque at carotid bifurcation extending into the LEFT ICA. Luminal measurements of 3.1/4.4 proximal/distal correlate with a less than 50% stenosis. No dissection. Vertebral arteries: LEFT vertebral dominant/sole contributor. 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 Skeleton: Cervical spondylosis.  C4-C7 ACDF.  No acute findings. Other neck: No neck masses.  Airway midline. Upper chest: Mild vascular congestion. Review of the MIP images confirms the above findings CTA HEAD FINDINGS Anterior circulation: No significant stenosis, proximal occlusion, aneurysm, or vascular malformation. Posterior circulation: RIGHT vertebral in the neck and skull base is occluded as noted previously. BILATERAL PICA vessels are patent. There is some retrograde flow from the basilar which opacifies the RIGHT V4 distal most segment. The LEFT vertebral demonstrates circumferential calcification but no flow-limiting stenosis. Basilar artery dolichoectatic but widely patent. No cerebellar branch occlusion. No saccular aneurysm. Venous sinuses: Patent. Anatomic variants: None. Delayed phase: No abnormal enhancement. Review of the MIP images confirms the above findings IMPRESSION: Nonstenotic atheromatous change both carotid bifurcations. 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 No significant intracranial stenosis, dissection, or saccular  aneurysm. No abnormal postcontrast enhancement. Electronically Signed   By: Elsie Stain M.D.   On: 10/14/2017 17:04    Procedures Procedures (including critical care time)  Medications Ordered in ED Medications  iopamidol (ISOVUE-370) 76 % injection (has no administration in time range)  insulin aspart (novoLOG) injection 0-9 Units (has no administration in time range)  insulin aspart (novoLOG) injection 0-5 Units (has no administration in time range)  dextrose 50 % solution 25 mL (25 mLs Intravenous Given 10/14/17 1526)  iopamidol (ISOVUE-370) 76 % injection 50 mL (50 mLs Intravenous Contrast Given 10/14/17 1600)     Initial Impression / Assessment and Plan / ED Course  I have reviewed the triage vital signs and the nursing notes.  Pertinent labs & imaging results that were available during my care of the patient were reviewed by me and considered in my medical decision making (see chart for details).     Discussed with neurology who will consult on patient.  Hospitalist to admit.  Final Clinical Impressions(s) / ED Diagnoses   Final diagnoses:  Numbness    ED Discharge Orders    None       Loren Racer, MD 10/14/17 [phone]

## 2017-10-14 NOTE — Consult Note (Addendum)
Referring Physician: Vascular Surgery    Chief Complaint: TIA  HPI: Tina [name] is an 67 y.o. female who is known to the vascular service since Nov 2018 for known vertebral dissection and Lt carotid stenosis. Today she was sent to the ER by vascular out pt office with complaints of transient neurologic symptoms on the left arm and face numbness and weakness. She had a facial droop as well. She doesn't note any speech deficits during these events. She describes two stereotyped episodes that completely resolved after minutes.  Past Medical History Past Medical History:  Diagnosis Date  . Atrial fibrillation (HCC)    a. diagnosed in 05/2017 --> started on Eliquis for anticoagulation  . Carotid artery occlusion   . CHF (congestive heart failure) (HCC)   . Depression   . Diabetes mellitus   . Fibromyalgia   . GERD (gastroesophageal reflux disease)   . Hyperlipidemia   . Hypertension   . Stroke (HCC)   . Superficial thrombophlebitis    right leg    Surgical History Past Surgical History:  Procedure Laterality Date  . CHOLECYSTECTOMY  1991  . HERNIA REPAIR  approx 1969  . NECK SURGERY  1998  . TONSILLECTOMY AND ADENOIDECTOMY  1966    Family History  Family History  Problem Relation Age of Onset  . Hypertension Mother   . Hyperlipidemia Mother   . Heart disease Mother   . Stroke Sister   . Hypertension Sister   . Hyperlipidemia Sister   . Heart disease Sister   . Diabetes Maternal Grandmother   . Diabetes Daughter     Social History:   reports that she has quit smoking. Her smoking use included cigarettes. She has never used smokeless tobacco. She reports that she does not drink alcohol or use drugs.  Allergies:  Allergies  Allergen Reactions  . Cefuroxime Axetil Other (See Comments)    REACTION: unspecified  . Fentanyl And Related Other (See Comments)    Patch: unknown reaction  . Niaspan [Niacin Er] Other (See Comments)    Burning all over  . Sulfa Antibiotics  Nausea And Vomiting  . Vasotec [Enalapril] Cough  . Welchol [Colesevelam Hcl] Other (See Comments)    Muscle aches and joint pains  . Lyrica [Pregabalin] Rash    Blistering rash around same time as starting drug  . Penicillins Hives and Rash    Has patient had a PCN reaction causing immediate rash, facial/tongue/throat swelling, SOB or lightheadedness with hypotension: Yes Has patient had a PCN reaction causing severe rash involving mucus membranes or skin necrosis: Yes Has patient had a PCN reaction that required hospitalization: No Has patient had a PCN reaction occurring within the last 10 years: No If all of the above answers are "NO", then may proceed with Cephalosporin use.     Home Medications:   (Not in a hospital admission)  Hospital Medications . iopamidol        ROS: History obtained from pt, EMR  General ROS: negative for - chills, fatigue, fever, night sweats, weight gain or weight loss Psychological ROS: negative for - behavioral disorder, hallucinations, memory difficulties, mood swings or suicidal ideation Ophthalmic ROS: negative for - blurry vision, double vision, eye pain or loss of vision ENT ROS: negative for - epistaxis, nasal discharge, oral lesions, sore throat, tinnitus or vertigo Allergy and Immunology ROS: negative for - hives or itchy/watery eyes Hematological and Lymphatic ROS: negative for - bleeding problems, bruising or swollen lymph nodes Endocrine ROS: negative  for - galactorrhea, hair pattern changes, polydipsia/polyuria or temperature intolerance Respiratory ROS: negative for - cough, hemoptysis, shortness of breath or wheezing Cardiovascular ROS: negative for - chest pain, dyspnea on exertion, edema or irregular heartbeat Gastrointestinal ROS: negative for - abdominal pain, diarrhea, hematemesis, nausea/vomiting or stool incontinence Genito-Urinary ROS: negative for - dysuria, hematuria, incontinence or urinary  frequency/urgency Musculoskeletal ROS: negative for - joint swelling or muscular weakness Neurological ROS: as noted in HPI Dermatological ROS: negative for rash and skin lesion changes   Physical Examination: Vitals:   10/14/17 1400 10/14/17 1445 10/14/17 1500 10/14/17 1514  BP: (!) 158/122 (!) 150/126 (!) 166/108   Pulse: (!) 105 (!) 111 92   Resp: (!) 23 (!) 24 20   Temp:      SpO2: 98% 98% 98%   Weight:    91.2 kg (201 lb)  Height:     (1.499 m)    General -  Heart - Regular rate and rhythm - no murmer appreciated Lungs - Clear to auscultation  Abdomen - Soft - non tender Extremities - Distal pulses intact - no edema Skin - Warm and dry   Neurologic Examination:  Mental Status: Alert, oriented, thought content appropriate.  Speech fluent without evidence of aphasia. Able to follow 3 step commands without difficulty. Cranial Nerves: II: Discs not visualized; Visual fields grossly normal, pupils equal, round, reactive to light III,IV, VI: ptosis not present, extra-ocular motions intact bilaterally V,VII: smile symmetric, facial light touch sensation normal bilaterally VIII: hearing normal bilaterally IX,X: gag reflex present XI: bilateral shoulder shrug XII: midline tongue extension Motor: RUE - 5/5    LUE - 5/5   RLE - 5/5    LLE - 5/5 Tone and bulk:normal tone throughout; no atrophy noted Sensory: Light touch intact throughout, bilaterally Deep Tendon Reflexes: 2+ and symmetric throughout Plantars: Right: downgoing   Left: downgoing Cerebellar: normal finger-to-nose and normal heel-to-shin test Gait: deferred at this time.   NIHSS 1a Level of Conscious:0 1b LOC Questions: 0 1c LOC Commands:0  2 Best Gaze: 0 3 Visual: 0 4 Facial Palsy:0  5a Motor Arm - left:0  5b Motor Arm - Right:0  6a Motor Leg - Left: 0 6b Motor Leg - Right:0  7 Limb Ataxia: 0 8 Sensory: 0 9 Best Language:0  10 Dysarthria:0 11 Extinct. and Inattention:0 TOTAL:  0   LABORATORY STUDIES:  Basic Metabolic Panel: Recent Labs  Lab 10/14/17 1130  NA 141  K 4.1  CL 106  CO2 27  GLUCOSE 128*  BUN 13  CREATININE 0.80  CALCIUM 9.6    Liver Function Tests: No results for input(s): AST, ALT, ALKPHOS, BILITOT, PROT, ALBUMIN in the last 168 hours. No results for input(s): LIPASE, AMYLASE in the last 168 hours. No results for input(s): AMMONIA in the last 168 hours.  CBC: Recent Labs  Lab 10/14/17 1130  WBC 8.2  HGB 13.9  HCT 40.5  MCV 93.8  PLT 245    Cardiac Enzymes: No results for input(s): CKTOTAL, CKMB, CKMBINDEX, TROPONINI in the last 168 hours.  BNP: Invalid input(s): POCBNP  CBG: Recent Labs  Lab 10/14/17 1516  GLUCAP 88    Microbiology:   Coagulation Studies: No results for input(s): LABPROT, INR in the last 72 hours.  Urinalysis: No results for input(s): COLORURINE, LABSPEC, PHURINE, GLUCOSEU, HGBUR, BILIRUBINUR, KETONESUR, PROTEINUR, UROBILINOGEN, NITRITE, LEUKOCYTESUR in the last 168 hours.  Invalid input(s): APPERANCEUR  Lipid Panel:     Component Value Date/Time   CHOL  224 (H) 08/17/2017 1022   TRIG 321 (H) 08/17/2017 1022   HDL 57 08/17/2017 1022   CHOLHDL 3.9 08/17/2017 1022   VLDL 72 (H) 03/12/2017 0739   LDLCALC 121 (H) 08/17/2017 1022    HgbA1C:  Lab Results  Component Value Date   HGBA1C 9.8 (H) 10/04/2017    Urine Drug Screen:     Component Value Date/Time   LABOPIA NONE DETECTED 03/10/2017 1752   COCAINSCRNUR NONE DETECTED 03/10/2017 1752   LABBENZ NONE DETECTED 03/10/2017 1752   AMPHETMU NONE DETECTED 03/10/2017 1752   THCU NONE DETECTED 03/10/2017 1752   LABBARB NONE DETECTED 03/10/2017 1752     Alcohol Level:  No results for input(s): ETH in the last 168 hours.  Miscellaneous labs:  EKG  EKG   IMAGING: Dg Chest 2 View  Result Date: 10/14/2017 CLINICAL DATA:  Shortness of breath. Numbness in her face and left arm. EXAM: CHEST - 2 VIEW COMPARISON:  06/08/2017 FINDINGS:  The heart size and mediastinal contours are within normal limits. Both lungs are clear. Previous anterior cervical fusion. IMPRESSION: No active cardiopulmonary disease. Electronically Signed   By: Francene Boyers M.D.   On: 10/14/2017 12:12   Ct Head Wo Contrast  Result Date: 10/14/2017 CLINICAL DATA:  Intermittent numbness of the face and left arm since last weekend. Fatigue. EXAM: CT HEAD WITHOUT CONTRAST TECHNIQUE: Contiguous axial images were obtained from the base of the skull through the vertex without intravenous contrast. COMPARISON:  MRI 04/17/2017, head CT 03/10/2017 FINDINGS: Brain: Mild involutional changes of the brain with chronic mild-to-moderate microvascular ischemic change of the periventricular white matter. Chronic bilateral basal ganglial lacunar infarcts, right more prominent than left. Chronic small right cerebellar infarct. No large vascular territory infarct, hemorrhage or midline shift. No intra-axial mass nor extra-axial fluid collections. Midline fourth ventricle and basal cisterns. Vascular: Atherosclerotic calcifications of the distal vertebral arteries and carotid siphons bilaterally. No hyperdense vessel sign. Skull: No acute calvarial fracture or suspicious osseous lesions. Sinuses/Orbits: No acute finding. Other: None. IMPRESSION: 1. 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 2. No acute intracranial abnormality. Electronically Signed   By: Tollie Eth M.D.   On: 10/14/2017 14:41     Assessment: 67 y.o. female with stereotyped left side numbness, concerning for TIA or stroke.  Stroke Risk Factors - previous stroke, known carotid stenosis, HTN, Afib, HLD  # Transient left side numbness- in setting of known Right carotid disease, this is concerning for TIA or Stroke. MRI brain ordered.   # Right ICA stenosis- CTA neck pending for further evaluation.Vasc surgery following for possible CEA  -Last  CUS showed R ICA 40-59% and L ICA 1-39% # HTN- permissive HTN at this time to SBP 220 with known stenosis # HLD- LDL goal is <70 in setting of ICA stenosis # h/o stroke with vert dissection. Followed by out pt neuro/vasc sx # CHF- will get Echo for better eval # DM2 w/hyperglycemia- SSI while here with tight glucose control  Plan:  HgbA1c, fasting lipid panel  MRI brain  CTA head/neck   PT consult, OT consult, Speech consult  Echocardiogram to evaluate known HF  NO Prophylactic therapy for now pending surgical plans  Statin Therapy  Risk factor modification  Telemetry monitoring  Frequent neuro checks  Fall Precautions   NEUROHOSPITALIST ADDENDUM Seen and examined the patient today. I have reviewed the contents of history and physical exam as documented by PA/ARNP/Resident and agree with above  documentation.  I have discussed and formulated the above plan as documented. Edits to the note have been made as needed.   Possibly TIA vs complicated migraine. Does have headache associated with symptoms. Also not sure if Right carotid is 60%. Will order carotid US.    Georgiana Spinner Syed Zukas MD [facility] [phone]   If 7pm to 7am, please call on call as listed on AMION.

## 2017-10-14 NOTE — ED Notes (Signed)
Pt taken from lobby to X-ray then will be brought back to the room

## 2017-10-14 NOTE — ED Notes (Signed)
Carb modified diet ordered 

## 2017-10-14 NOTE — ED Notes (Signed)
Meal tray delivered.

## 2017-10-14 NOTE — H&P (Signed)
[facility] History and Physical  Tina [name] VWU:[id number] DOB: [birth year] DOA: 10/14/2017  Referring physician: Dr Ranae Palms PCP: Salley Scarlet, MD   Chief Complaint: L sided facial / UE numbness  HPI: Tina [name] is a 67 y.o. female with hist of atrial fib, DM2, CHF CVA hypertension , R vertebral art dissection 2018, and hyperlipidemia went to see her vascular surgery doctor today reporting L sided facial numbness and numbness of the left arm onset today with two separate episodes.  Sent to ED where w/u showed old strokes on CT nothing acute and patient was seen by neurology.  Symptoms resolved.  Neurology recommends admit for permissive HTN and further w/u per vasc surgery.  Asked to see for medical admission.   Patient's only c/o is a recent increase in DOE walking across the room makes her tired and SOB no ankle edema or orthopnea no cough or fevers or CP.    Patient lives in [place], [place].     ROS  denies CP  no joint pain   no HA  no blurry vision  no rash  no diarrhea  no nausea/ vomiting  no dysuria  no difficulty voiding  no change in urine color    Past Medical History  Past Medical History:  Diagnosis Date  . Atrial fibrillation (HCC)    a. diagnosed in 05/2017 --> started on Eliquis for anticoagulation  . Carotid artery occlusion   . CHF (congestive heart failure) (HCC)   . Depression   . Diabetes mellitus   . Fibromyalgia   . GERD (gastroesophageal reflux disease)   . Hyperlipidemia   . Hypertension   . Stroke (HCC)   . Superficial thrombophlebitis    right leg   Past Surgical History  Past Surgical History:  Procedure Laterality Date  . CHOLECYSTECTOMY  1991  . HERNIA REPAIR  approx 1969  . NECK SURGERY  1998  . TONSILLECTOMY AND ADENOIDECTOMY  1966   Family History  Family History  Problem Relation Age of Onset  . Hypertension Mother   . Hyperlipidemia Mother   . Heart disease Mother   . Stroke Sister   . Hypertension Sister    . Hyperlipidemia Sister   . Heart disease Sister   . Diabetes Maternal Grandmother   . Diabetes Daughter    Social History  reports that she has quit smoking. Her smoking use included cigarettes. She has never used smokeless tobacco. She reports that she does not drink alcohol or use drugs. Allergies  Allergies  Allergen Reactions  . Cefuroxime Axetil Other (See Comments)    REACTION: unspecified  . Fentanyl And Related Other (See Comments)    Patch: unknown reaction  . Niaspan [Niacin Er] Other (See Comments)    Burning all over  . Sulfa Antibiotics Nausea And Vomiting  . Vasotec [Enalapril] Cough  . Welchol [Colesevelam Hcl] Other (See Comments)    Muscle aches and joint pains  . Lyrica [Pregabalin] Rash    Blistering rash around same time as starting drug  . Penicillins Hives and Rash    Has patient had a PCN reaction causing immediate rash, facial/tongue/throat swelling, SOB or lightheadedness with hypotension: Yes Has patient had a PCN reaction causing severe rash involving mucus membranes or skin necrosis: Yes Has patient had a PCN reaction that required hospitalization: No Has patient had a PCN reaction occurring within the last 10 years: No If all of the above answers are "NO", then may proceed with Cephalosporin use.  Home medications Prior to Admission medications   Medication Sig Start Date End Date Taking? Authorizing Provider  albuterol (PROVENTIL HFA;VENTOLIN HFA) 108 (90 Base) MCG/ACT inhaler Inhale 2 puffs into the lungs every 6 (six) hours as needed for wheezing or shortness of breath. 05/23/17   Salley Scarlet, MD  apixaban (ELIQUIS) 5 MG TABS tablet Take 1 tablet (5 mg total) by mouth 2 (two) times daily. 08/03/17   [place], Velna Hatchet, MD  benzonatate (TESSALON) 100 MG capsule Take 1 capsule (100 mg total) by mouth 3 (three) times daily as needed for cough. 07/20/17   Salley Scarlet, MD  clotrimazole-betamethasone (LOTRISONE) cream Apply 1 application  topically 2 (two) times daily. Patient taking differently: Apply 1 application topically daily as needed (itching).  12/15/16   Salley Scarlet, MD  doxycycline (VIBRA-TABS) 100 MG tablet Take 1 tablet (100 mg total) by mouth 2 (two) times daily. 08/17/17   Salley Scarlet, MD  fenofibrate 54 MG tablet  BY MOUTH ONCE DAILY IN THE MORNING 08/03/17   [place], Velna Hatchet, MD  furosemide (LASIX) 40 MG tablet Take 1 tablet (40 mg total) by mouth 2 (two) times daily as needed for fluid. Take 1 tablet twice a day Patient taking differently: Take 40 mg by mouth 2 (two) times daily as needed for fluid.  05/26/17   Johnson, Clanford L, MD  gabapentin (NEURONTIN) 300 MG capsule Take 1 capsule (300 mg total) by mouth at bedtime. 08/17/17   [place], Velna Hatchet, MD  HYDROcodone-acetaminophen (NORCO) 7.5-325 MG tablet Take 1 tablet by mouth every 6 (six) hours as needed for moderate pain. 09/20/17   [place], Velna Hatchet, MD  Insulin Glargine (BASAGLAR KWIKPEN) 100 UNIT/ML SOPN Inject 1.2 mLs (120 Units total) into the skin every morning. And pen needles 2/day 07/21/17   Romero Belling, MD  ipratropium (ATROVENT) 0.02 % nebulizer solution Take 2.5 mLs (0.5 mg total) by nebulization every 4 (four) hours as needed for wheezing or shortness of breath. 05/27/17   Salley Scarlet, MD  ipratropium (ATROVENT) 0.06 % nasal spray Place 2 sprays into both nostrils 4 (four) times daily as needed for rhinitis. 10/05/16   [place], Velna Hatchet, MD  losartan (COZAAR) 50 MG tablet Take 1 tablet (50 mg total) by mouth daily. 06/20/17 09/18/17  [place], Lennart Pall, PA-C  metoprolol tartrate (LOPRESSOR) 25 MG tablet Take 0.5 tablets (12.5 mg total) by mouth 2 (two) times daily. 09/26/17 09/21/18  Salley Scarlet, MD  montelukast (SINGULAIR) 10 MG tablet  BY MOUTH ONCE DAILY 08/03/17   Salley Scarlet, MD  mupirocin ointment (BACTROBAN) 2 % Apply 1 application topically 2 (two) times daily. 08/17/17   Salley Scarlet, MD  nystatin  (MYCOSTATIN/NYSTOP) powder Apply topically 4 (four) times daily. Patient taking differently: Apply 1 g topically 4 (four) times daily as needed (rash).  12/15/16   [place], Velna Hatchet, MD  Omega-3 Fatty Acids (FISH OIL) 1000 MG CAPS Take 2 capsules (2,000 mg total) by mouth 2 (two) times daily. 04/21/17   Salley Scarlet, MD  pantoprazole (PROTONIX) 40 MG tablet Take 1 tablet (40 mg total) by mouth daily. 08/03/17   Salley Scarlet, MD  PARoxetine (PAXIL) 40 MG tablet  BY MOUTH ONCE DAILY 08/03/17   Salley Scarlet, MD  potassium chloride (K-DUR) 10 MEQ tablet Take 1 tablet (10 mEq total) by mouth 2 (two) times daily. Patient taking differently: Take 10 mEq by mouth 2 (two) times daily as needed (when taking  lasix).  06/03/16   [place], Velna Hatchet, MD  rosuvastatin (CRESTOR) 40 MG tablet TAKE 1 TABLET BY MOUTH  EVERY EVENING Patient taking differently:  BY MOUTH ONCE DAILY IN THE EVENING 05/04/17   Salley Scarlet, MD  TRUEPLUS INSULIN SYRINGE 31G X 5/16" 1 ML MISC USE AS DIRECTED TO INJECT 12/23/14   Salley Scarlet, MD   Liver Function Tests No results for input(s): AST, ALT, ALKPHOS, BILITOT, PROT, ALBUMIN in the last 168 hours. No results for input(s): LIPASE, AMYLASE in the last 168 hours. CBC Recent Labs  Lab 10/14/17 1130  WBC 8.2  HGB 13.9  HCT 40.5  MCV 93.8  PLT 245   Basic Metabolic Panel Recent Labs  Lab 10/14/17 1130  NA 141  K 4.1  CL 106  CO2 27  GLUCOSE 128*  BUN 13  CREATININE 0.80  CALCIUM 9.6     Vitals:   10/14/17 1500 10/14/17 1514 10/14/17 1515 10/14/17 1530  BP: (!) 166/108  (!) 155/130 (!) 161/128  Pulse: 92  (!) 103 (!) 101  Resp: 20  (!) 22 11  Temp:      SpO2: 98%  96% 99%  Weight:  91.2 kg (201 lb)    Height:   (1.499 m)     Exam: Gen small framed obese female no distress No rash, cyanosis or gangrene Sclera anicteric, throat clear  No jvd or bruits Chest clear bilat to bases RRR no MRG Abd soft ntnd no mass or ascites  +bs obese and mod distended GU defer MS no joint effusions or deformity Ext no LE or UE edema / no wounds or ulcers Neuro is alert, Ox 3 , nf, good sensation bilat UE's and LE's no motor deficits no cranial nerve deficits    Home meds: -lasix 40 bid/ cozaar 50 qd/ metoprolol 12.5 bid -crestor/ Kdur/ ppi/ neurontin 300 hs/ fenfibrate 54/ doxy bid/ norco prn -eliquis 5 bid -glargine insulin 120 qam -atrovent nebs prn/ albuterol nebs prn/ singulair 10 mg qd   Na 141 K 4.1  BUN 13  Cr 0.8   LFT's ok  WBC 8k  Hb 13 BNP 424  Trop 0.02  Head CT >  IMPRESSION: 1. 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 2. No acute intracranial abnormality.  CTA head w/ and w/o contrast > IMPRESSION: 1) Nonstenotic atheromatous change both carotid bifurcations. 2) Chronic occlusion RIGHT vertebral artery, related to RIGHT vertebral dissection stable from 2018. 3) No significant intracranial stenosis, dissection, or saccular aneurysm. 4) No abnormal postcontrast enhancement.  EKG (independ reviewed) > afib, no ischemic changes VR 90's CXR (independ reviewed) > IMPRESSION: No active cardiopulmonary disease.   Assessment: 1  TIA - hx of prior CVA now w transietn left facial and LUE numbness today also w/ facial droop per neurology notes.  Resolved now. 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  CTA today shows 100% occlusion of R vertebral artery and nonstenotic bilat carotid artery atheromatous changes.  Will plan admit, cont home meds including eliquis for atrial fib.  Neurology and vasc surgery following.   2  Hx R vert artery dissection - NOv 2018 3  DM2 on insulin 4  Hx of CHF - bnp up and more DOE, CXR clear , will give 24-48 hrs of iv lasix 40 bid 5  Atrial fib - cont BB and eliquis 6  HTN - permissive HTN per neuro will hold bp meds unless SBP >  220 7  Depression - cont meds 8  DVT Proph - eliquis for  afib  Plan - as above       Tina [name] D [facility] Pager [phone]   If 7PM-7AM, please contact night-coverage [email or web address] Password [facility] 10/14/2017, 5:23 PM

## 2017-10-14 NOTE — ED Notes (Signed)
Patient transported to MRI 

## 2017-10-15 ENCOUNTER — Other Ambulatory Visit (HOSPITAL_COMMUNITY): Payer: Medicare HMO

## 2017-10-15 ENCOUNTER — Other Ambulatory Visit: Payer: Self-pay

## 2017-10-15 ENCOUNTER — Encounter (HOSPITAL_COMMUNITY): Payer: Self-pay

## 2017-10-15 DIAGNOSIS — G459 Transient cerebral ischemic attack, unspecified: Secondary | ICD-10-CM

## 2017-10-15 DIAGNOSIS — E11649 Type 2 diabetes mellitus with hypoglycemia without coma: Secondary | ICD-10-CM

## 2017-10-15 LAB — GLUCOSE, CAPILLARY
Glucose-Capillary: 163 mg/dL — ABNORMAL HIGH (ref 65–99)
Glucose-Capillary: 235 mg/dL — ABNORMAL HIGH (ref 65–99)
Glucose-Capillary: 264 mg/dL — ABNORMAL HIGH (ref 65–99)
Glucose-Capillary: 281 mg/dL — ABNORMAL HIGH (ref 65–99)
Glucose-Capillary: 292 mg/dL — ABNORMAL HIGH (ref 65–99)

## 2017-10-15 LAB — HEMOGLOBIN A1C
Hgb A1c MFr Bld: 8.8 % — ABNORMAL HIGH (ref 4.8–5.6)
Mean Plasma Glucose: 205.86 mg/dL

## 2017-10-15 LAB — URINALYSIS, ROUTINE W REFLEX MICROSCOPIC
BACTERIA UA: NONE SEEN
BILIRUBIN URINE: NEGATIVE
Glucose, UA: >=500 mg/dL — AB
Hgb urine dipstick: NEGATIVE
Ketones, ur: NEGATIVE mg/dL
Leukocytes, UA: NEGATIVE
NITRITE: NEGATIVE
PROTEIN: NEGATIVE mg/dL
Specific Gravity, Urine: 1.043 — ABNORMAL HIGH (ref 1.005–1.030)
pH: 6 (ref 5.0–8.0)

## 2017-10-15 LAB — COMPREHENSIVE METABOLIC PANEL WITH GFR
ALT: 18 U/L (ref 14–54)
AST: 18 U/L (ref 15–41)
Albumin: 3.4 g/dL — ABNORMAL LOW (ref 3.5–5.0)
Alkaline Phosphatase: 61 U/L (ref 38–126)
Anion gap: 9 (ref 5–15)
BUN: 16 mg/dL (ref 6–20)
CO2: 28 mmol/L (ref 22–32)
Calcium: 9.1 mg/dL (ref 8.9–10.3)
Chloride: 101 mmol/L (ref 101–111)
Creatinine, Ser: 1.24 mg/dL — ABNORMAL HIGH (ref 0.44–1.00)
GFR calc Af Amer: 52 mL/min — ABNORMAL LOW
GFR calc non Af Amer: 45 mL/min — ABNORMAL LOW
Glucose, Bld: 325 mg/dL — ABNORMAL HIGH (ref 65–99)
Potassium: 3.5 mmol/L (ref 3.5–5.1)
Sodium: 138 mmol/L (ref 135–145)
Total Bilirubin: 0.9 mg/dL (ref 0.3–1.2)
Total Protein: 7 g/dL (ref 6.5–8.1)

## 2017-10-15 LAB — MRSA PCR SCREENING: MRSA BY PCR: NEGATIVE

## 2017-10-15 MED ORDER — FUROSEMIDE 40 MG PO TABS
40.0000 mg | ORAL_TABLET | Freq: Two times a day (BID) | ORAL | Status: DC
  Administered 2017-10-15 – 2017-10-16 (×2): 40 mg via ORAL
  Filled 2017-10-15 (×2): qty 1

## 2017-10-15 MED ORDER — PNEUMOCOCCAL VAC POLYVALENT 25 MCG/0.5ML IJ INJ: 0.5000 mL | INJECTION | INTRAMUSCULAR | Status: DC

## 2017-10-15 MED ORDER — ASPIRIN EC 81 MG PO TBEC
81.0000 mg | DELAYED_RELEASE_TABLET | Freq: Every day | ORAL | Status: DC
  Administered 2017-10-15 – 2017-10-16 (×2): 81 mg via ORAL
  Filled 2017-10-15 (×2): qty 1

## 2017-10-15 MED ORDER — ATORVASTATIN CALCIUM 80 MG PO TABS
80.0000 mg | ORAL_TABLET | Freq: Every day | ORAL | Status: DC
Start: 2017-10-15 — End: 2017-10-16
  Administered 2017-10-15: 80 mg via ORAL
  Filled 2017-10-15: qty 1

## 2017-10-15 NOTE — Progress Notes (Signed)
Pt called and c/o of having numbness and tingling around  the lips and left arm, as it was earlier when she came to the ED, v/s stable, pt reassured, will continue to monitor. [place]-Asidi, Cambria Osten Efe

## 2017-10-15 NOTE — Progress Notes (Signed)
[facility]                                                                              Patient Demographics  Elysa Womac, is a 67 y.o. female, DOB - [birth year], NWG:[id number]  Admit date - 10/14/2017   Admitting Physician Delano Metz, MD  Outpatient Primary MD for the patient is [facility], Velna Hatchet, MD  Outpatient specialists:   LOS - 1  days   Medical records reviewed and are as summarized below:    Chief Complaint  Patient presents with  . Numbness       Brief summary   Patient is a 67 year old female with atrial for ablation, diabetes type 2 on insulin, CHF, CVA, hypertension, right vertebral artery dissection in 2018, hyperlipidemia went to see her vascular surgeon for follow-up.  She reported left-sided facial numbness, left arm numbness started on the day of admission with 2 separate episodes.  Patient was sent to ED for further work-up.  Patient also reported recent increase in dyspnea on exertion walking across the room.  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  MRI of the brain showed no acute intracranial abnormality Vascular surgery following, awaiting neurology recommendations regarding CEA   Active Problems:   Diabetes mellitus type II, uncontrolled (HCC) -Uncontrolled, hemoglobin A1c 9.8 on 5/14 -Currently on sliding scale insulin, Lantus 120 units qhs, refused last night -Has endocrinology appointment with Dr. Romero Belling next month    Hyperlipidemia -Follow lipid panel, currently on Crestor, low was  Dyspnea on exertion, possibly diastolic CHF  - d 2D echo 1/19 had shown EF of 65 to 70%, repeat echo -Patient received IV Lasix, changed to Lasix 40 mg twice a day -Continue strict I's and O's and daily weights    PAF (paroxysmal atrial fibrillation) (HCC) -Currently rate controlled, continue Eliquis  Code Status: Full CODE STATUS DVT Prophylaxis: Eliquis Family Communication: Discussed in detail with the patient, all imaging results, lab results explained to the patient   Disposition Plan:   Time Spent in minutes 25 minutes  Procedures:  MRI of the brain, CT angiogram head and neck  Consultants:   Neurology  Antimicrobials:      Medications  Scheduled Meds: . apixaban  5 mg Oral BID  . fenofibrate  54 mg Oral Daily  . furosemide  40 mg Oral BID  . gabapentin  300 mg Oral QHS  . insulin aspart  0-5 Units Subcutaneous QHS  . insulin aspart  0-9 Units Subcutaneous TID WC  . insulin glargine  120 Units Subcutaneous Q2200  . losartan  25 mg Oral Daily  . metoprolol tartrate  12.5 mg Oral BID  .  montelukast  10 mg Oral Daily  . omega-3 acid ethyl esters  2 g Oral BID  . pantoprazole  40 mg Oral Daily  . PARoxetine  40 mg Oral Daily  . [START ON 10/16/2017] pneumococcal 23 valent vaccine  0.5 mL Intramuscular Tomorrow-1000  . rosuvastatin  40 mg Oral QPM  . sodium chloride flush  3 mL Intravenous Q12H   Continuous Infusions: . sodium chloride     PRN Meds:.sodium chloride, acetaminophen **OR** acetaminophen, albuterol, bisacodyl, HYDROcodone-acetaminophen, ipratropium, ondansetron **OR** ondansetron (ZOFRAN) IV, sodium chloride flush, traMADol   Antibiotics   Anti-infectives (From admission, onward)   None        Subjective:   Ron Junco was seen and examined today.  Reports still has left upper extremity numbness and facial numbness.  Denies any chest pain,  shortness of breath, dizziness, lightheadedness.  Patient denies abdominal pain, N/V/D/C, new weakness, numbess, tingling. No acute events overnight.    Objective:   Vitals:   10/15/17 0135 10/15/17 0318 10/15/17 0409 10/15/17 0848  BP: 120/85  (!) 128/94 127/80  Pulse: 65  76 95  Resp: Temp:   97.7 F (36.5 C)   TempSrc:   Oral   SpO2:   98% 96%  Weight:  90 kg (198 lb 6.6 oz)    Height:        Intake/Output Summary (Last 24 hours) at 10/15/2017 1033 Last data filed at 10/15/2017 0847 Gross per 24 hour  Intake 720 ml  Output 400 ml  Net 320 ml     Wt Readings from Last 3 Encounters:  10/15/17 90 kg (198 lb 6.6 oz)  10/14/17 91.4 kg (201 lb 9.6 oz)  10/04/17 90.3 kg (199 lb)     Exam  General:, sleepy, NAD  Eyes:   HEENT:  Atraumatic, normocephalic  Cardiovascular: S1 S2 auscultated,Regular rate and rhythm.  Respiratory: Clear to auscultation bilaterally  Gastrointestinal: Soft, nontender, nondistended, + bowel sounds  Ext: no pedal edema bilaterally  Neuro: no FND currently  Musculoskeletal: No digital cyanosis, clubbing  Skin: No rashes  Psych:  sleepy, NAD   Data Reviewed:  I have personally reviewed following labs and imaging studies  Micro Results Recent Results (from the past 240 hour(s))  MRSA PCR Screening     Status: None   Collection Time: 10/15/17 12:23 AM  Result Value Ref Range Status   MRSA by PCR NEGATIVE NEGATIVE Final    Comment:        The GeneXpert MRSA Assay (FDA approved for NASAL specimens only), is one component of a comprehensive MRSA colonization surveillance program. It is not intended to diagnose MRSA infection nor to guide or monitor treatment for MRSA infections. Performed at [facility], [address], [place], [place] [address]     Radiology Reports Ct Angio Head W Or Wo Contrast  Result Date: 10/14/2017 CLINICAL DATA:  Intermittent numbness in the face since last weekend. EXAM: CT  ANGIOGRAPHY HEAD AND NECK TECHNIQUE: Multidetector CT imaging of the head and neck was performed using the standard protocol during bolus administration of intravenous contrast. Multiplanar CT image reconstructions and MIPs were obtained to evaluate the vascular anatomy. Carotid stenosis measurements (when applicable) are obtained utilizing NASCET criteria, using the distal internal carotid diameter as the denominator. CONTRAST:  50mL ISOVUE-370 IOPAMIDOL (ISOVUE-370) INJECTION 76% COMPARISON:  CT head earlier today.  CTA head neck 03/10/2017. FINDINGS: CTA NECK FINDINGS Aortic arch: Dolichoectasia.  No great vessel stenosis. Right  carotid system: Calcified and noncalcified plaque at the carotid bifurcation extending into the RIGHT ICA. Luminal measurements of 2.8/3.8 proximal/distal correlate with a less than 50% stenosis. No dissection. Left carotid system: Calcified and noncalcified plaque at carotid bifurcation extending into the LEFT ICA. Luminal measurements of 3.1/4.4 proximal/distal correlate with a less than 50% stenosis. No dissection. Vertebral arteries: LEFT vertebral dominant/sole contributor. 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 Skeleton: Cervical spondylosis.  C4-C7 ACDF.  No acute findings. Other neck: No neck masses.  Airway midline. Upper chest: Mild vascular congestion. Review of the MIP images confirms the above findings CTA HEAD FINDINGS Anterior circulation: No significant stenosis, proximal occlusion, aneurysm, or vascular malformation. Posterior circulation: RIGHT vertebral in the neck and skull base is occluded as noted previously. BILATERAL PICA vessels are patent. There is some retrograde flow from the basilar which opacifies the RIGHT V4 distal most segment. The LEFT vertebral demonstrates circumferential calcification but no flow-limiting stenosis. Basilar artery dolichoectatic but widely patent. No cerebellar branch occlusion. No saccular  aneurysm. Venous sinuses: Patent. Anatomic variants: None. Delayed phase: No abnormal enhancement. Review of the MIP images confirms the above findings IMPRESSION: Nonstenotic atheromatous change both carotid bifurcations. 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 No significant intracranial stenosis, dissection, or saccular aneurysm. No abnormal postcontrast enhancement. Electronically Signed   By: Elsie Stain M.D.   On: 10/14/2017 17:04   Dg Chest 2 View  Result Date: 10/14/2017 CLINICAL DATA:  Shortness of breath. Numbness in her face and left arm. EXAM: CHEST - 2 VIEW COMPARISON:  06/08/2017 FINDINGS: The heart size and mediastinal contours are within normal limits. Both lungs are clear. Previous anterior cervical fusion. IMPRESSION: No active cardiopulmonary disease. Electronically Signed   By: Francene Boyers M.D.   On: 10/14/2017 12:12   Ct Head Wo Contrast  Result Date: 10/14/2017 CLINICAL DATA:  Intermittent numbness of the face and left arm since last weekend. Fatigue. EXAM: CT HEAD WITHOUT CONTRAST TECHNIQUE: Contiguous axial images were obtained from the base of the skull through the vertex without intravenous contrast. COMPARISON:  MRI 04/17/2017, head CT 03/10/2017 FINDINGS: Brain: Mild involutional changes of the brain with chronic mild-to-moderate microvascular ischemic change of the periventricular white matter. Chronic bilateral basal ganglial lacunar infarcts, right more prominent than left. Chronic small right cerebellar infarct. No large vascular territory infarct, hemorrhage or midline shift. No intra-axial mass nor extra-axial fluid collections. Midline fourth ventricle and basal cisterns. Vascular: Atherosclerotic calcifications of the distal vertebral arteries and carotid siphons bilaterally. No hyperdense vessel sign. Skull: No acute calvarial fracture or suspicious osseous lesions. Sinuses/Orbits: No acute finding. Other:  None. IMPRESSION: 1. 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 2. No acute intracranial abnormality. Electronically Signed   By: Tollie Eth M.D.   On: 10/14/2017 14:41   Ct Angio Neck W And/or Wo Contrast  Result Date: 10/14/2017 CLINICAL DATA:  Intermittent numbness in the face since last weekend. EXAM: CT ANGIOGRAPHY HEAD AND NECK TECHNIQUE: Multidetector CT imaging of the head and neck was performed using the standard protocol during bolus administration of intravenous contrast. Multiplanar CT image reconstructions and MIPs were obtained to evaluate the vascular anatomy. Carotid stenosis measurements (when applicable) are obtained utilizing NASCET criteria, using the distal internal carotid diameter as the denominator. CONTRAST:  50mL ISOVUE-370 IOPAMIDOL (ISOVUE-370) INJECTION 76% COMPARISON:  CT head earlier today.  CTA head neck 03/10/2017. FINDINGS: CTA NECK FINDINGS  Aortic arch: Dolichoectasia.  No great vessel stenosis. Right carotid system: Calcified and noncalcified plaque at the carotid bifurcation extending into the RIGHT ICA. Luminal measurements of 2.8/3.8 proximal/distal correlate with a less than 50% stenosis. No dissection. Left carotid system: Calcified and noncalcified plaque at carotid bifurcation extending into the LEFT ICA. Luminal measurements of 3.1/4.4 proximal/distal correlate with a less than 50% stenosis. No dissection. Vertebral arteries: LEFT vertebral dominant/sole contributor. 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 Skeleton: Cervical spondylosis.  C4-C7 ACDF.  No acute findings. Other neck: No neck masses.  Airway midline. Upper chest: Mild vascular congestion. Review of the MIP images confirms the above findings CTA HEAD FINDINGS Anterior circulation: No significant stenosis, proximal occlusion, aneurysm, or vascular malformation. Posterior circulation: RIGHT  vertebral in the neck and skull base is occluded as noted previously. BILATERAL PICA vessels are patent. There is some retrograde flow from the basilar which opacifies the RIGHT V4 distal most segment. The LEFT vertebral demonstrates circumferential calcification but no flow-limiting stenosis. Basilar artery dolichoectatic but widely patent. No cerebellar branch occlusion. No saccular aneurysm. Venous sinuses: Patent. Anatomic variants: None. Delayed phase: No abnormal enhancement. Review of the MIP images confirms the above findings IMPRESSION: Nonstenotic atheromatous change both carotid bifurcations. 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 No significant intracranial stenosis, dissection, or saccular aneurysm. No abnormal postcontrast enhancement. Electronically Signed   By: Elsie Stain M.D.   On: 10/14/2017 17:04   Mr Brain Wo Contrast  Result Date: 10/14/2017 CLINICAL DATA:  Initial evaluation for acute left-sided numbness. EXAM: MRI HEAD WITHOUT CONTRAST TECHNIQUE: Multiplanar, multiecho pulse sequences of the brain and surrounding structures were obtained without intravenous contrast. COMPARISON:  Prior CT and CTA from earlier the same day. FINDINGS: Brain: Generalized age-related cerebral atrophy. Patchy T2/FLAIR hyperintensity within the periventricular deep white matter both cerebral hemispheres, consistent with chronic small vessel ischemic change. Scattered remote lacunar infarcts present within the bilateral basal ganglia, right thalamus, and right corona radiata. No abnormal foci of restricted diffusion to suggest acute ischemic infarct. Minimal punctate diffusion abnormality within the posterior left centrum semi ovale seen on axial DWI sequence image 39 favored to be artifactual in nature/T2 shine through. Gray-white matter differentiation maintained. No other areas of remote chronic infarction. No evidence for acute or chronic  intracranial hemorrhage. No mass lesion, midline shift or mass effect. No hydrocephalus. No extra-axial fluid collection. Major dural sinuses grossly patent. Pituitary gland suprasellar region normal. Midline structures intact and normal. Vascular: Major intracranial vascular flow voids maintained. Skull and upper cervical spine: Craniocervical junction normal. Postsurgical changes partially visualize within the upper cervical spine. No marrow signal abnormality. Scalp soft tissues normal. Sinuses/Orbits: Globes and orbital soft tissues within normal limits. Paranasal sinuses are clear. Trace right mastoid effusion. Inner ear structures normal. Other: None. IMPRESSION: 1. No acute intracranial abnormality. 2. Mild chronic small vessel ischemic change with scattered remote lacunar infarcts as above. Electronically Signed   By: Rise Mu M.D.   On: 10/14/2017 20:25    Lab Data:  CBC: Recent Labs  Lab 10/14/17 1130  WBC 8.2  HGB 13.9  HCT 40.5  MCV 93.8  PLT 245   Basic Metabolic Panel: Recent Labs  Lab 10/14/17 1130 10/15/17 0144  NA 141 138  K 4.1 3.5  CL 106 101  CO2 27 28  GLUCOSE 128* 325*  BUN 13 16  CREATININE 0.80 1.24*  CALCIUM 9.6 9.1   GFR: Estimated Creatinine  Clearance: 44.2 mL/min (A) (by C-G formula based on SCr of 1.24 mg/dL (H)). Liver Function Tests: Recent Labs  Lab 10/15/17 0144  AST 18  ALT 18  ALKPHOS 61  BILITOT 0.9  PROT 7.0  ALBUMIN 3.4*   No results for input(s): LIPASE, AMYLASE in the last 168 hours. No results for input(s): AMMONIA in the last 168 hours. Coagulation Profile: No results for input(s): INR, PROTIME in the last 168 hours. Cardiac Enzymes: No results for input(s): CKTOTAL, CKMB, CKMBINDEX, TROPONINI in the last 168 hours. BNP (last 3 results) No results for input(s): PROBNP in the last 8760 hours. HbA1C: No results for input(s): HGBA1C in the last 72 hours. CBG: Recent Labs  Lab 10/14/17 1516 10/14/17 2302  10/15/17 0632  GLUCAP 88 330* 264*   Lipid Profile: No results for input(s): CHOL, HDL, LDLCALC, TRIG, CHOLHDL, LDLDIRECT in the last 72 hours. Thyroid Function Tests: No results for input(s): TSH, T4TOTAL, FREET4, T3FREE, THYROIDAB in the last 72 hours. Anemia Panel: No results for input(s): VITAMINB12, FOLATE, FERRITIN, TIBC, IRON, RETICCTPCT in the last 72 hours. Urine analysis:    Component Value Date/Time   COLORURINE YELLOW 10/14/2017 1126   APPEARANCEUR CLEAR 10/14/2017 1126   LABSPEC 1.043 (H) 10/14/2017 1126   PHURINE 6.0 10/14/2017 1126   GLUCOSEU >=500 (A) 10/14/2017 1126   HGBUR NEGATIVE 10/14/2017 1126   BILIRUBINUR NEGATIVE 10/14/2017 1126   KETONESUR NEGATIVE 10/14/2017 1126   PROTEINUR NEGATIVE 10/14/2017 1126   UROBILINOGEN 0.2 09/14/2013 1508   NITRITE NEGATIVE 10/14/2017 1126   LEUKOCYTESUR NEGATIVE 10/14/2017 1126     Jaimie Pippins M.D. [facility] 10/15/2017, 10:33 AM  Pager: [phone] Between 7am to 7pm - call Pager - [phone]  After 7pm go to [email or web address] - password [email or web address]  Call night coverage person covering after 7pm

## 2017-10-15 NOTE — Progress Notes (Signed)
   Daily Progress Note  Repeat carotid duplex ordered.  If Neuro thinks R ICA is the source of patient's TIA, will proceed with R CEA, otherwise, maximal medical therapy.  I find the R ICA lesion on CTA to be underwhelming, but from experience, I have routinely found substantially more ulcerated plaque during surgery that predicted by the CTA.     Awaiting Neuro's opinion  Hold NOAC until decision made.  Leonides Sake, MD, FACS [facility] Office: [phone] Pager: [phone]  10/15/2017, 1:07 PM

## 2017-10-15 NOTE — Progress Notes (Signed)
Pt admitted from ED with stroke like symptoms, pt alert and oriented, denies any pain, settled in bed with call light within pt's reach, tele monitor put and verified on pt, was however reassured and will continue to monitor, safety concern addressed accordingly. Ob[place]-Asidi, Ares Cardozo Efe

## 2017-10-15 NOTE — Progress Notes (Signed)
STROKE TEAM PROGRESS NOTE   HISTORY OF PRESENT ILLNESS (per record) Tina [name] is an 67 y.o. female who is known to the vascular service since Nov 2018 for known vertebral dissection and Lt carotid stenosis. Today she was sent to the ER by vascular out pt office with complaints of transient neurologic symptoms on the left arm and face numbness and weakness. She had a facial droop as well. She doesn't note any speech deficits during these events. She describes two stereotyped episodes that completely resolved after minutes.    SUBJECTIVE (INTERVAL HISTORY) Her family is not at the bedside. She feels completely resolved    OBJECTIVE Temp:  [97.7 F (36.5 C)-98.2 F (36.8 C)] 97.7 F (36.5 C) (05/25 0409) Pulse Rate:  [41-117] 95 (05/25 0848) Cardiac Rhythm: Atrial fibrillation (05/25 0700) Resp:  [10-25] 16 (05/25 0848) BP: (120-166)/(80-135) 127/80 (05/25 0848) SpO2:  [93 %-99 %] 96 % (05/25 0848) Weight:  [198 lb 6.6 oz (90 kg)-201 lb 9.6 oz (91.4 kg)] 198 lb 6.6 oz (90 kg) (05/25 0318)  CBC:  Recent Labs  Lab 10/14/17 1130  WBC 8.2  HGB 13.9  HCT 40.5  MCV 93.8  PLT 245    Basic Metabolic Panel:  Recent Labs  Lab 10/14/17 1130 10/15/17 0144  NA 141 138  K 4.1 3.5  CL 106 101  CO2 27 28  GLUCOSE 128* 325*  BUN 13 16  CREATININE 0.80 1.24*  CALCIUM 9.6 9.1    Lipid Panel:     Component Value Date/Time   CHOL 224 (H) 08/17/2017 1022   TRIG 321 (H) 08/17/2017 1022   HDL 57 08/17/2017 1022   CHOLHDL 3.9 08/17/2017 1022   VLDL 72 (H) 03/12/2017 0739   LDLCALC 121 (H) 08/17/2017 1022   HgbA1c:  Lab Results  Component Value Date   HGBA1C 9.8 (H) 10/04/2017   Urine Drug Screen:     Component Value Date/Time   LABOPIA NONE DETECTED 03/10/2017 1752   COCAINSCRNUR NONE DETECTED 03/10/2017 1752   LABBENZ NONE DETECTED 03/10/2017 1752   AMPHETMU NONE DETECTED 03/10/2017 1752   THCU NONE DETECTED 03/10/2017 1752   LABBARB NONE DETECTED 03/10/2017 1752    Alcohol Level     Component Value Date/Time   ETH <10 03/10/2017 1740    IMAGING  Ct Angio Head W Or Wo Contrast Ct Angio Neck W And/or Wo Contrast 10/14/2017 IMPRESSION:  Nonstenotic atheromatous change both carotid bifurcations. 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 No significant intracranial stenosis, dissection, or saccular aneurysm. No abnormal postcontrast enhancement.    Dg Chest 2 View 10/14/2017 IMPRESSION:  No active cardiopulmonary disease.    Ct Head Wo Contrast 10/14/2017 IMPRESSION:  1. 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  2. No acute intracranial abnormality.    Ct Angio Neck W And/or Wo Contrast 10/14/2017 IMPRESSION:  Nonstenotic atheromatous change both carotid bifurcations. 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 No significant intracranial stenosis, dissection, or saccular aneurysm. No abnormal postcontrast enhancement.     Mr Brain Wo Contrast 10/14/2017 IMPRESSION:  1. No acute intracranial abnormality.  2. Mild chronic small vessel ischemic change with scattered remote lacunar infarcts as above.     Transthoracic Echocardiogram - pending 00/00/00    Bilateral Carotid Dopplers - pending 00/00/00     PHYSICAL EXAM Vitals:   10/15/17 5409 10/15/17 8119 10/15/17 0409 10/15/17 0848  BP: 120/85  (!) 128/94 127/80  Pulse: 65  76 95  Resp: Temp:   97.7 F (36.5 C)   TempSrc:   Oral   SpO2:   98% 96%  Weight:  198 lb 6.6 oz (90 kg)    Height:        General -  Heart - Regular rate and rhythm - no murmer appreciated Lungs - Clear to auscultation anteriorly Abdomen - Soft - non tender Extremities - Distal pulses intact - no edema Skin - Warm and dry  Mental Status: Alert, oriented, thought content appropriate.  Speech  fluent without evidence of aphasia.  Able to follow 3 step commands without difficulty. Cranial Nerves: II: Discs not visualized; Visual fields grossly normal, pupils equal, round, reactive to light. III,IV, VI: ptosis not present, extra-ocular motions intact bilaterally V,VII: smile symmetric, facial light touch sensation normal bilaterally VIII: hearing normal bilaterally IX,X: gag reflex present XI: bilateral shoulder shrug intact. XII: midline tongue extension Motor: RUE - 5/5    LUE - 5/5 RLE - 5/5    LLE -  5/5 Tone and bulk:normal tone throughout; no atrophy noted Sensory: Light touch intact throughout, bilaterally Deep Tendon Reflexes: 2+ and symmetric throughout Plantars: Right: downgoing   Left: downgoing Cerebellar: normal finger-to-nose, normal rapid alternating movements and normal heel-to-shin test Gait: not tested    ASSESSMENT/PLAN Tina [name] is a 68 y.o. female with history of atrial fibrillation on Eliquis, congestive heart failure, depression, diabetes mellitus, hyperlipidemia, hypertension, previous strokes, carotid artery disease, and history of a right vertebral artery dissection with occlusion presenting with transient left-sided weakness/numbness. She did not receive IV t-PA due to resolution of deficits.  Possible TIA:   Resultant  Resolution of deficits  CT head - chronic bilateral basal ganglial and right cerebellar infarcts. No acute abnormality.  MRI head - no acute abnormality - scattered remote lacunar infarcts.  MRA head - not performed  CTA H&N - Chronic occlusion RIGHT vertebral artery, related to remote RIGHT vertebral dissection.  Carotid Doppler - pending  2D Echo - pending  LDL - 121  HgbA1c - 9.8  VTE prophylaxis - Eliquis Diet Order           Diet Carb Modified Fluid consistency: Thin; Room service appropriate? Yes  Diet effective now          Eliquis (apixaban) daily prior to admission, now on Eliquis (apixaban)  daily  Patient counseled to be compliant with her antithrombotic medications  Ongoing aggressive stroke risk factor management  Therapy recommendations:  pending  Disposition:  Pending  Hypertension  Stable . Permissive hypertension (OK if < 220/120) but gradually normalize in 5-7 days . Long-term BP goal normotensive  Hyperlipidemia  Lipid lowering medication PTA:  Crestor 40 mg daily  LDL 121, goal < 70  Current lipid lowering medication: Crestor 40 mg daily  Continue statin at discharge  Diabetes  HgbA1c 9.8, goal < 7.0  Uncontrolled  Other Stroke Risk Factors  Advanced age  Former cigarette smoker - quit  Obesity, Body mass index is 40.07 kg/m., recommend weight loss, diet and exercise as appropriate   Hx stroke/TIA  Family hx stroke (mother)  Atrial Fibrillation   Other Active Problems  Possible right internal carotid artery plaque versus thrombosis - possible CEA on Monday Dr. Imogene Burn. Awaiting neurology input. Carotid dopplers pending.  ? Hold Eliquis for surgery Monday.   Plan / Recommendations   Stroke workup: awaiting - Carotid  Dopplers and transthoracic echo  Therapy Follow Up: pending  Disposition: pending  Antiplatelet / Anticoagulation:   Statin: On max dose Crestor - verify compliance  MD Follow Up: [facility] in 6-8 weeks  Other: patient needs better glucose control.  Further risk factor modification per primary care MD: Follow Up 2 weeks    Addendum - per Dr Roda Shutters  Would hold off on CEA for now. Stenosis is not severe enough despite soft plaque and questionable dissection.  Continue Eliquis but add ASA 81 mg daily  Change Crestor to Lipitor 80 mg daily  Repeat CTA in two to three months.   Hospital day # 1  Discussed case with Dr. Roda Shutters, agree would hold off on CEA and add ASA  and follow up with neurology. Will await carotid dopplers and echo before signing off.  To contact Stroke Continuity  provider, please refer to [email or web address]. After hours, contact General Neurology

## 2017-10-16 ENCOUNTER — Inpatient Hospital Stay (HOSPITAL_COMMUNITY): Payer: Medicare HMO

## 2017-10-16 ENCOUNTER — Encounter (HOSPITAL_COMMUNITY): Payer: Self-pay

## 2017-10-16 DIAGNOSIS — I6381 Other cerebral infarction due to occlusion or stenosis of small artery: Secondary | ICD-10-CM

## 2017-10-16 DIAGNOSIS — I480 Paroxysmal atrial fibrillation: Secondary | ICD-10-CM

## 2017-10-16 DIAGNOSIS — E7800 Pure hypercholesterolemia, unspecified: Secondary | ICD-10-CM

## 2017-10-16 DIAGNOSIS — I639 Cerebral infarction, unspecified: Secondary | ICD-10-CM

## 2017-10-16 LAB — BASIC METABOLIC PANEL
Anion gap: 11 (ref 5–15)
BUN: 17 mg/dL (ref 6–20)
CHLORIDE: 101 mmol/L (ref 101–111)
CO2: 24 mmol/L (ref 22–32)
CREATININE: 0.91 mg/dL (ref 0.44–1.00)
Calcium: 9 mg/dL (ref 8.9–10.3)
Glucose, Bld: 243 mg/dL — ABNORMAL HIGH (ref 65–99)
POTASSIUM: 3.7 mmol/L (ref 3.5–5.1)
SODIUM: 136 mmol/L (ref 135–145)

## 2017-10-16 LAB — GLUCOSE, CAPILLARY
GLUCOSE-CAPILLARY: 177 mg/dL — AB (ref 65–99)
GLUCOSE-CAPILLARY: 231 mg/dL — AB (ref 65–99)

## 2017-10-16 LAB — ECHOCARDIOGRAM COMPLETE
Height: 59 in
Weight: 3199.32 oz

## 2017-10-16 LAB — MAGNESIUM: Magnesium: 2 mg/dL (ref 1.7–2.4)

## 2017-10-16 LAB — LIPID PANEL
Cholesterol: 181 mg/dL (ref 0–200)
HDL: 43 mg/dL (ref >40)
LDL CALC: 87 mg/dL (ref 0–99)
TRIGLYCERIDES: 253 mg/dL — AB (ref <150)
Total CHOL/HDL Ratio: 4.2 RATIO
VLDL: 51 mg/dL — AB (ref 0–40)

## 2017-10-16 MED ORDER — ATORVASTATIN CALCIUM 80 MG PO TABS: 80.0000 mg | ORAL_TABLET | Freq: Every day | ORAL | 4 refills | Status: DC

## 2017-10-16 MED ORDER — ASPIRIN 81 MG PO TBEC: 81.0000 mg | DELAYED_RELEASE_TABLET | Freq: Every day | ORAL | 4 refills | Status: AC

## 2017-10-16 NOTE — Evaluation (Signed)
Physical Therapy Evaluation Patient Details Name: Tina [name] MRN: [id number] DOB: [birth year] Today's Date: 10/16/2017   History of Present Illness  Pt adm with lt sided facial numbness and UE numbness. MRI revealed acute punctate nonhemorrhagic infarct involving the lateral right thalamus. PMH - DM, depression, SOB, chf, cva, afib, verterbral artery dissection  Clinical Impression  Pt presented to PT with slightly unsteady gait that was much improved with use of UE support. Demonstrated/verbalized good use of rollator. Pt may end up "furniture walking" in the smaller environment of mobile home but needs to use the rollator in a more open environment. Further PT with HHPT.    Follow Up Recommendations Home health PT    Equipment Recommendations  Other (comment)(4 wheeled walker with a seat (rollator))    Recommendations for Other Services       Precautions / Restrictions Precautions Precautions: Fall      Mobility  Bed Mobility Overal bed mobility: Modified Independent             General bed mobility comments: Incr time and effort  Transfers Overall transfer level: Modified independent Equipment used: None;Rolling walker (2 wheeled);4-wheeled walker;Straight cane Transfers: Sit to/from Stand Sit to Stand: Modified independent (Device/Increase time)         General transfer comment: Verbal cues for hand placement  Ambulation/Gait Ambulation/Gait assistance: Supervision;Min assist Ambulation Distance (Feet): 150 Feet(150' x 2, 100' x 1, 50' x 1) Assistive device: Rolling walker (2 wheeled);4-wheeled walker;Straight cane;1 person hand held assist;None Gait Pattern/deviations: Step-through pattern;Decreased stride length;Drifts right/left Gait velocity: decr Gait velocity interpretation: <1.31 ft/sec, indicative of household ambulator General Gait Details: slightly unsteady gait when not using assistive device or some form of UE support. Pt with much improved  stability in open environment when using rollator   Stairs            Wheelchair Mobility    Modified Rankin (Stroke Patients Only)       Balance Overall balance assessment: Needs assistance Sitting-balance support: No upper extremity supported;Feet supported Sitting balance-Leahy Scale: Good     Standing balance support: No upper extremity supported;During functional activity Standing balance-Leahy Scale: Fair Standing balance comment: UE support needed for dynamic activities                             Pertinent Vitals/Pain Pain Assessment: No/denies pain    Home Living Family/patient expects to be discharged to:: Private residence Living Arrangements: Spouse/significant other(ex husband) Available Help at Discharge: Family;Available 24 hours/day Type of Home: Mobile home(has been staying with ex husband) Home Access: Stairs to enter Entrance Stairs-Rails: Right;Left;Can reach both Entrance Stairs-Number of Steps: 3-4 Home Layout: One level Home Equipment: None      Prior Function Level of Independence: Independent               Hand Dominance        Extremity/Trunk Assessment   Upper Extremity Assessment Upper Extremity Assessment: Defer to OT evaluation    Lower Extremity Assessment Lower Extremity Assessment: Overall WFL for tasks assessed       Communication   Communication: No difficulties  Cognition Arousal/Alertness: Awake/alert Behavior During Therapy: WFL for tasks assessed/performed Overall Cognitive Status: Within Functional Limits for tasks assessed  General Comments      Exercises     Assessment/Plan    PT Assessment All further PT needs can be met in the next venue of care  PT Problem List Decreased balance;Decreased mobility;Decreased knowledge of use of DME;Impaired sensation       PT Treatment Interventions      PT Goals (Current goals can be  found in the Care Plan section)  Acute Rehab PT Goals PT Goal Formulation: All assessment and education complete, DC therapy    Frequency     Barriers to discharge        Co-evaluation               AM-PAC PT "6 Clicks" Daily Activity  Outcome Measure Difficulty turning over in bed (including adjusting bedclothes, sheets and blankets)?: A Little Difficulty moving from lying on back to sitting on the side of the bed? : A Little Difficulty sitting down on and standing up from a chair with arms (e.g., wheelchair, bedside commode, etc,.)?: A Little Help needed moving to and from a bed to chair (including a wheelchair)?: None Help needed walking in hospital room?: A Little Help needed climbing 3-5 steps with a railing? : A Little 6 Click Score: 19    End of Session Equipment Utilized During Treatment: Gait belt Activity Tolerance: Patient tolerated treatment well Patient left: in bed;with call bell/phone within reach;with bed alarm set Nurse Communication: Mobility status PT Visit Diagnosis: Unsteadiness on feet (R26.81);Other abnormalities of gait and mobility (R26.89);Other symptoms and signs involving the nervous system (R29.898)    Time: 1414-1440 PT Time Calculation (min) (ACUTE ONLY): 26 min   Charges:   PT Evaluation $PT Eval Moderate Complexity: 1 Mod PT Treatments $Gait Training: 8-22 mins   PT G Codes:        [facility] PT [place] 10/16/2017, 3:35 PM

## 2017-10-16 NOTE — Progress Notes (Signed)
  Echocardiogram 2D Echocardiogram has been performed.  Tina [name] F 10/16/2017, 9:17 AM

## 2017-10-16 NOTE — Progress Notes (Signed)
   Daily Progress Note  Pt's case discussed with Neurology.  Two different neurologist don't feel R ICA disease accounts for this patient's sx.     Defer further mgmt to Neurology  Will let Dr. Randie Heinz know outcome of work-up  Patient is NOT scheduled for OR tomorrow    Leonides Sake, MD, FACS [facility] Office: [phone] Pager: [phone]  10/16/2017, 12:11 PM

## 2017-10-16 NOTE — Progress Notes (Signed)
Telemetry called to report 3 occurances of pauses; at 0931 patient had 2.17 second pause then a 2.08 second pause and another 2.05 second pause; MD called.

## 2017-10-16 NOTE — Progress Notes (Signed)
Patient ready for discharge to home; discharge instructions given and reviewed; Rx's given; patient discharged home accompanied by her friend.

## 2017-10-16 NOTE — Care Management Note (Addendum)
Case Management Note  Patient Details  Name: Tina [name] MRN: [id number] Date of Birth: 06-Jul-1950  Subjective/Objective:    Pt admitted on 10/14/17 with acute TIA.  PTA, pt fairly independent, lives with friend [name] currently.                 Action/Plan: Pt medically stable for dc today, per MD.  HH follow up ordered, and pt agreeable to plan.  Referral to [facility], per pt choice; start of care 24-48h post dc date. Referral to [facility] for Rollator walker, to be delivered to pt's room prior to dc.  Discharge address for HH: [address] [place], [place] [address]   Expected Discharge Date:  10/16/17               Expected Discharge Plan:  Home w Home Health Services  In-House Referral:     Discharge planning Services  CM Consult  Post Acute Care Choice:  Home Health Choice offered to:  Patient  DME Arranged:  Walker rolling with seat DME Agency:  [facility]  HH Arranged:  RN, PT, OT, Nurse's Aide HH Agency:  [facility]  Status of Service:  Completed, signed off  If discussed at Long Length of Stay Meetings, dates discussed:    Additional Comments:  Quintella Baton, RN, BSN  Trauma/Neuro ICU Case Manager [phone]

## 2017-10-16 NOTE — Progress Notes (Signed)
STROKE TEAM PROGRESS NOTE   HISTORY OF PRESENT ILLNESS (per record) Tina [name] is an 67 y.o. female who is known to the vascular service since Nov 2018 for known vertebral dissection and Lt carotid stenosis. Today she was sent to the ER by vascular out pt office with complaints of transient neurologic symptoms on the left arm and face numbness and weakness. She had a facial droop as well. She doesn't note any speech deficits during these events. She describes two stereotyped episodes that completely resolved after minutes.    SUBJECTIVE (INTERVAL HISTORY) Her family is not at the bedside. She still has right-sided symptoms. MRI showed a small-vessel lacunar infarct. Discussed with Dr. Isidoro Donning.    OBJECTIVE Temp:  [97.5 F (36.4 C)-98.3 F (36.8 C)] 98 F (36.7 C) (05/26 0935) Pulse Rate:  [54-88] 58 (05/26 0935) Cardiac Rhythm: Normal sinus rhythm (05/26 0702) Resp:  [16-22] 16 (05/26 0935) BP: (120-135)/(66-89) 120/89 (05/26 0935) SpO2:  [91 %-98 %] 91 % (05/26 0935) Weight:  [199 lb 15.3 oz (90.7 kg)] 199 lb 15.3 oz (90.7 kg) (05/26 0352)  CBC:  Recent Labs  Lab 10/14/17 1130  WBC 8.2  HGB 13.9  HCT 40.5  MCV 93.8  PLT 245    Basic Metabolic Panel:  Recent Labs  Lab 10/14/17 1130 10/15/17 0144  NA 141 138  K 4.1 3.5  CL 106 101  CO2 27 28  GLUCOSE 128* 325*  BUN 13 16  CREATININE 0.80 1.24*  CALCIUM 9.6 9.1    Lipid Panel:     Component Value Date/Time   CHOL 181 10/16/2017 0411   TRIG 253 (H) 10/16/2017 0411   HDL 43 10/16/2017 0411   CHOLHDL 4.2 10/16/2017 0411   VLDL 51 (H) 10/16/2017 0411   LDLCALC 87 10/16/2017 0411   LDLCALC 121 (H) 08/17/2017 1022   HgbA1c:  Lab Results  Component Value Date   HGBA1C 8.8 (H) 10/15/2017   Urine Drug Screen:     Component Value Date/Time   LABOPIA NONE DETECTED 03/10/2017 1752   COCAINSCRNUR NONE DETECTED 03/10/2017 1752   LABBENZ NONE DETECTED 03/10/2017 1752   AMPHETMU NONE DETECTED 03/10/2017 1752    THCU NONE DETECTED 03/10/2017 1752   LABBARB NONE DETECTED 03/10/2017 1752    Alcohol Level     Component Value Date/Time   ETH <10 03/10/2017 1740    IMAGING  Ct Angio Head W Or Wo Contrast Ct Angio Neck W And/or Wo Contrast 10/14/2017 IMPRESSION:  Nonstenotic atheromatous change both carotid bifurcations. 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 No significant intracranial stenosis, dissection, or saccular aneurysm. No abnormal postcontrast enhancement.    Dg Chest 2 View 10/14/2017 IMPRESSION:  No active cardiopulmonary disease.    Ct Head Wo Contrast 10/14/2017 IMPRESSION:  1. 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  2. No acute intracranial abnormality.    Ct Angio Neck W And/or Wo Contrast 10/14/2017 IMPRESSION:  Nonstenotic atheromatous change both carotid bifurcations. 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 No significant intracranial stenosis, dissection, or saccular aneurysm. No abnormal postcontrast enhancement.   MRI brain wo contrast 10/16/2017 -Punctate acute nonhemorrhagic infarct involving the lateral right thalamus   Mr Brain Wo Contrast 10/14/2017 IMPRESSION:  1. No acute intracranial abnormality.  2. Mild chronic small vessel ischemic change with scattered remote lacunar infarcts as above.     Transthoracic Echocardiogram - pending  00/00/00    Bilateral Carotid Dopplers - pending 00/00/00     PHYSICAL EXAM Vitals:   10/16/17 0006 10/16/17 0352 10/16/17 0440 10/16/17 0935  BP: 135/66  129/76 120/89  Pulse: (!) 58  (!) 54 (!) 58  Resp: (!) Temp: (!) 97.5 F (36.4 C)  97.6 F (36.4 C) 98 F (36.7 C)  TempSrc: Oral  Oral Oral  SpO2: 95%  96% 91%  Weight:  199 lb 15.3 oz (90.7 kg)    Height:        General -  Heart - Regular  rate and rhythm - no murmer appreciated Lungs - Clear to auscultation anteriorly Abdomen - Soft - non tender Extremities - Distal pulses intact - no edema Skin - Warm and dry  Mental Status: Alert, oriented, thought content appropriate.  Speech fluent without evidence of aphasia.  Able to follow 3 step commands without difficulty. Cranial Nerves: II: Discs not visualized; Visual fields grossly normal, pupils equal, round, reactive to light. III,IV, VI: ptosis not present, extra-ocular motions intact bilaterally V,VII: smile symmetric, facial light touch decreased lower left VIII: hearing normal bilaterally IX,X: gag reflex present XI: bilateral shoulder shrug intact. XII: midline tongue extension Motor: RUE - 5/5    LUE - 5/5 RLE - 5/5    LLE -  5/5 Tone and bulk:normal tone throughout; no atrophy noted Sensory: Light touch intact throughout, bilaterally Deep Tendon Reflexes: 2+ and symmetric throughout Plantars: Right: downgoing   Left: downgoing Cerebellar: normal finger-to-nose, normal rapid alternating movements and normal heel-to-shin test Gait: not tested    ASSESSMENT/PLAN Tina [name] is a 67 y.o. female with history of atrial fibrillation on Eliquis, congestive heart failure, depression, diabetes mellitus, hyperlipidemia, hypertension, previous strokes, carotid artery disease, and history of a right vertebral artery dissection with occlusion presenting with transient left-sided weakness/numbness. She did not receive IV t-PA due to resolution of deficits.  Small-vessel stroke: Punctate acute nonhemorrhagic infarct involving the lateral right thalamus  Resultant  Left fcial numbness  CT head - chronic bilateral basal ganglial and right cerebellar infarcts. No acute abnormality  MRI head - Punctate acute nonhemorrhagic infarct involving the lateral right thalamus and remote lacunar infarcts.  MRA head - not performed  CTA H&N - Chronic occlusion RIGHT vertebral  artery, related to remote RIGHT vertebral dissection.  Carotid Doppler - No pfo or thrombus   2D Echo- Right 40-59%, left 1-39%  LDL - 121  HgbA1c - 9.8  VTE prophylaxis - Eliquis Diet Order           Diet Carb Modified Fluid consistency: Thin; Room service appropriate? Yes  Diet effective now          Eliquis (apixaban) daily prior to admission, now on Eliquis (apixaban) daily  Patient counseled to be compliant with her antithrombotic medications  Ongoing aggressive stroke risk factor management  Therapy recommendations:  pending  Disposition:  Pending  Hypertension  Stable . Permissive hypertension (OK if < 220/120) but gradually normalize in 5-7 days . 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  Further risk factor modification per primary care MD: Follow Up 2 weeks    Addendum - per Dr Roda Shutters   Stenosis is not severe enough for CEA despite soft plaque and questionable dissection. Stroke is not embolic. No CEA, follow up outpatient  Continue Eliquis but add ASA 81 mg daily, discussed risk of bleeding  Change Crestor to Lipitor 80 mg daily  Needs to better manage her vascular risk  factors, f/u with pcp   Hospital day # 2  Personally  participated in, made any corrections needed, and agree with history, physical, neuro exam,assessment and plan as stated above.     Naomie Dean, MD [facility]   To contact Stroke Continuity provider, please refer to [email or web address]. After hours, contact General Neurology

## 2017-10-16 NOTE — Discharge Summary (Signed)
Physician Discharge Summary   Patient ID: Tina [name] MRN: [id number] DOB/AGE: 66-Jan-1952 66 y.o.  Admit date: 10/14/2017 Discharge date: 10/16/2017  Primary Care Physician:  Salley Scarlet, MD   Recommendations for Outpatient Follow-up:  1. Follow up with PCP in 1-2 weeks  Home Health: None  Equipment/Devices:   Discharge Condition: stable CODE STATUS: FULL  Diet recommendation: Carb modified   Discharge Diagnoses:   Punctate acute nonhemorrhagic infarct right thalamic . Diabetes mellitus type II, uncontrolled (HCC) . Hyperlipidemia . Depression . SOB (shortness of breath) . PAF (paroxysmal atrial fibrillation) (HCC) . Chronic vertebral artery dissection [facility])   Consults: Neurology Vascular surgery    Allergies:   Allergies  Allergen Reactions  . Cefuroxime Axetil Other (See Comments)    REACTION: unspecified  . Fentanyl And Related Other (See Comments)    Patch: unknown reaction  . Niaspan [Niacin Er] Other (See Comments)    Burning all over  . Sulfa Antibiotics Nausea And Vomiting  . Vasotec [Enalapril] Cough  . Welchol [Colesevelam Hcl] Other (See Comments)    Muscle aches and joint pains  . Lyrica [Pregabalin] Rash    Blistering rash around same time as starting drug  . Penicillins Hives and Rash    Has patient had a PCN reaction causing immediate rash, facial/tongue/throat swelling, SOB or lightheadedness with hypotension: Yes Has patient had a PCN reaction causing severe rash involving mucus membranes or skin necrosis: Yes Has patient had a PCN reaction that required hospitalization: No Has patient had a PCN reaction occurring within the last 10 years: No If all of the above answers are "NO", then may proceed with Cephalosporin use.      DISCHARGE MEDICATIONS: Allergies as of 10/16/2017      Reactions   Cefuroxime Axetil Other (See Comments)   REACTION: unspecified   Fentanyl And Related Other (See Comments)   Patch: unknown reaction    Niaspan [niacin Er] Other (See Comments)   Burning all over   Sulfa Antibiotics Nausea And Vomiting   Vasotec [enalapril] Cough   Welchol [colesevelam Hcl] Other (See Comments)   Muscle aches and joint pains   Lyrica [pregabalin] Rash   Blistering rash around same time as starting drug   Penicillins Hives, Rash   Has patient had a PCN reaction causing immediate rash, facial/tongue/throat swelling, SOB or lightheadedness with hypotension: Yes Has patient had a PCN reaction causing severe rash involving mucus membranes or skin necrosis: Yes Has patient had a PCN reaction that required hospitalization: No Has patient had a PCN reaction occurring within the last 10 years: No If all of the above answers are "NO", then may proceed with Cephalosporin use.      Medication List    STOP taking these medications   HYDROcodone-acetaminophen 7.5-325 MG tablet Commonly known as:  NORCO   metoprolol tartrate 25 MG tablet Commonly known as:  LOPRESSOR   potassium chloride 10 MEQ tablet Commonly known as:  K-DUR   rosuvastatin 40 MG tablet Commonly known as:  CRESTOR     TAKE these medications   albuterol 108 (90 Base) MCG/ACT inhaler Commonly known as:  PROVENTIL HFA;VENTOLIN HFA Inhale 2 puffs into the lungs every 6 (six) hours as needed for wheezing or shortness of breath.   apixaban 5 MG Tabs tablet Commonly known as:  ELIQUIS Take 1 tablet (5 mg total) by mouth 2 (two) times daily.   aspirin 81 MG EC tablet Take 1 tablet (81 mg total) by mouth daily.  atorvastatin 80 MG tablet Commonly known as:  LIPITOR Take 1 tablet (80 mg total) by mouth at bedtime.   BASAGLAR KWIKPEN 100 UNIT/ML Sopn Inject 1.2 mLs (120 Units total) into the skin every morning. And pen needles 2/day What changed:    when to take this  additional instructions   benzonatate 100 MG capsule Commonly known as:  TESSALON Take 1 capsule (100 mg total) by mouth 3 (three) times daily as needed for cough.    clotrimazole-betamethasone cream Commonly known as:  LOTRISONE Apply 1 application topically 2 (two) times daily. What changed:    when to take this  reasons to take this   fenofibrate 54 MG tablet  BY MOUTH ONCE DAILY IN THE MORNING What changed:    how much to take  how to take this  when to take this  additional instructions   Fish Oil 1000 MG Caps Take 2 capsules (2,000 mg total) by mouth 2 (two) times daily.   furosemide 40 MG tablet Commonly known as:  LASIX Take 1 tablet (40 mg total) by mouth 2 (two) times daily as needed for fluid. Take 1 tablet twice a day What changed:  additional instructions   gabapentin 300 MG capsule Commonly known as:  NEURONTIN Take 1 capsule (300 mg total) by mouth at bedtime.   ipratropium 0.02 % nebulizer solution Commonly known as:  ATROVENT Take 2.5 mLs (0.5 mg total) by nebulization every 4 (four) hours as needed for wheezing or shortness of breath.   ipratropium 0.06 % nasal spray Commonly known as:  ATROVENT Place 2 sprays into both nostrils 4 (four) times daily as needed for rhinitis.   losartan 50 MG tablet Commonly known as:  COZAAR Take 1 tablet (50 mg total) by mouth daily. What changed:  how much to take   montelukast 10 MG tablet Commonly known as:  SINGULAIR  BY MOUTH ONCE DAILY What changed:    how much to take  how to take this  when to take this  additional instructions   nystatin powder Commonly known as:  MYCOSTATIN/NYSTOP Apply topically 4 (four) times daily. What changed:    how much to take  when to take this  reasons to take this   pantoprazole 40 MG tablet Commonly known as:  PROTONIX Take 1 tablet (40 mg total) by mouth daily.   PARoxetine 40 MG tablet Commonly known as:  PAXIL  BY MOUTH ONCE DAILY What changed:    how much to take  how to take this  when to take this  additional instructions   TRUEPLUS INSULIN SYRINGE 31G X 5/16" 1 ML Misc Generic drug:   Insulin Syringe-Needle U-100 USE AS DIRECTED TO INJECT        Brief H and P: For complete details please refer to admission H and P, but in briefPatient is a 67 year old female with atrial for ablation, diabetes type 2 on insulin, CHF, CVA, hypertension, right vertebral artery dissection in 2018, hyperlipidemia went to see her vascular surgeon for follow-up.  She reported left-sided facial numbness, left arm numbness started on the day of admission with 2 separate episodes.  Patient was sent to ED for further work-up.  Patient also reported recent increase in dyspnea on exertion walking across the room.  No chest pain   Hospital Course:  Acute nonhemorrhagic right thalamic punctate infarct/CVA -CT head showed chronic mild to moderate small vessel ischemic disease, chronic bilateral basal ganglia and right cerebellar infarcts, no acute intracranial abnormality -CT  angiogram of the head and neck showed chronic occlusion right vertebral artery related to right vertebral dissection stable -Carotid ultrasound 10/07/2017 had shown right ICA 40-59%, left 1 -39% ICA stenosis -Seen by neurology,  MRI of the brain showed no acute intracranial abnormality Vascular surgery was also consulted, however neurology did not feel that patient CVA was due to carotid disease -Repeat MRI of the brain on 5/26 showed acute punctate nonhemorrhagic infarct involving the lateral right thalamus. -Discussed with neurology, Dr Lucia Gaskins, recommended adding aspirin 81 mg daily, continue Eliquis, added Lipitor 80 mg daily, cleared for discharge home. -2D echo showed EF of 60 to 65%    Diabetes mellitus type II, uncontrolled (HCC) -Uncontrolled, hemoglobin A1c 9.8 on 5/14 -Currently on sliding scale insulin, Lantus 120 units qhs -Has endocrinology appointment with Dr. Romero Belling next month    Hyperlipidemia -Lipid panel showed LDL 87, placed on Lipitor 80 mg daily  Dyspnea on exertion, possibly diastolic CHF  - d 2D  echo 1/19 had shown EF of 65 to 70%, repeat echo -Continue Lasix 40 mg twice a day -Continue strict I's and O's and daily weights    PAF (paroxysmal atrial fibrillation) (HCC) -Currently rate controlled, continue Eliquis Added aspirin 81 mg daily    Day of Discharge S: Still having some perioral numbness otherwise no worsening of symptoms  BP 135/78 (BP Location: Left Arm)   Pulse 64   Temp 98.5 F (36.9 C) (Oral)   Resp 20   Ht 4\' 11"  (1.499 m)   Wt 90.7 kg (199 lb 15.3 oz)   SpO2 98%   BMI 40.39 kg/m   Physical Exam: General: Alert and awake oriented x3 not in any acute distress. HEENT: anicteric sclera, pupils reactive to light and accommodation CVS: S1-S2 clear no murmur rubs or gallops Chest: clear to auscultation bilaterally, no wheezing rales or rhonchi Abdomen: soft nontender, nondistended, normal bowel sounds Extremities: no cyanosis, clubbing or edema noted bilaterally Neuro: Cranial nerves II-XII intact, no focal neurological deficits   The results of significant diagnostics from this hospitalization (including imaging, microbiology, ancillary and laboratory) are listed below for reference.      Procedures/Studies:  Study Conclusions  - Left ventricle: The cavity size was normal. Wall thickness was   increased in a pattern of moderate LVH. Systolic function was   normal. The estimated ejection fraction was in the range of 60%   to 65%. Wall motion was normal; there were no regional wall   motion abnormalities. The study is not technically sufficient to   allow evaluation of LV diastolic function. - Mitral valve: Mildly thickened leaflets . There was mild   regurgitation. - Left atrium: Moderately dilated. - Right atrium: The atrium was mildly dilated. - Tricuspid valve: There was mild regurgitation. - Pulmonary arteries: PA peak pressure: 41 mm Hg (S). - Inferior vena cava: The vessel was normal in size. The   respirophasic diameter changes were in  the normal range (>= 50%),   consistent with normal central venous pressure.  Impressions:  - Compared to a prior study in 05/2017, the LVEF is lower but normal   at 60-65% with moderate LAE and mild RAE, mild TR and RVSP of 41   mmHg.  Ct Angio Head W Or Wo Contrast  Result Date: 10/14/2017 CLINICAL DATA:  Intermittent numbness in the face since last weekend. EXAM: CT ANGIOGRAPHY HEAD AND NECK TECHNIQUE: Multidetector CT imaging of the head and neck was performed using the standard protocol during bolus  administration of intravenous contrast. Multiplanar CT image reconstructions and MIPs were obtained to evaluate the vascular anatomy. Carotid stenosis measurements (when applicable) are obtained utilizing NASCET criteria, using the distal internal carotid diameter as the denominator. CONTRAST:  50mL ISOVUE-370 IOPAMIDOL (ISOVUE-370) INJECTION 76% COMPARISON:  CT head earlier today.  CTA head neck 03/10/2017. FINDINGS: CTA NECK FINDINGS Aortic arch: Dolichoectasia.  No great vessel stenosis. Right carotid system: Calcified and noncalcified plaque at the carotid bifurcation extending into the RIGHT ICA. Luminal measurements of 2.8/3.8 proximal/distal correlate with a less than 50% stenosis. No dissection. Left carotid system: Calcified and noncalcified plaque at carotid bifurcation extending into the LEFT ICA. Luminal measurements of 3.1/4.4 proximal/distal correlate with a less than 50% stenosis. No dissection. Vertebral arteries: LEFT vertebral dominant/sole contributor. 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 Skeleton: Cervical spondylosis.  C4-C7 ACDF.  No acute findings. Other neck: No neck masses.  Airway midline. Upper chest: Mild vascular congestion. Review of the MIP images confirms the above findings CTA HEAD FINDINGS Anterior circulation: No significant stenosis, proximal occlusion, aneurysm, or vascular malformation. Posterior circulation: RIGHT  vertebral in the neck and skull base is occluded as noted previously. BILATERAL PICA vessels are patent. There is some retrograde flow from the basilar which opacifies the RIGHT V4 distal most segment. The LEFT vertebral demonstrates circumferential calcification but no flow-limiting stenosis. Basilar artery dolichoectatic but widely patent. No cerebellar branch occlusion. No saccular aneurysm. Venous sinuses: Patent. Anatomic variants: None. Delayed phase: No abnormal enhancement. Review of the MIP images confirms the above findings IMPRESSION: Nonstenotic atheromatous change both carotid bifurcations. 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 No significant intracranial stenosis, dissection, or saccular aneurysm. No abnormal postcontrast enhancement. Electronically Signed   By: Elsie Stain M.D.   On: 10/14/2017 17:04   Dg Chest 2 View  Result Date: 10/14/2017 CLINICAL DATA:  Shortness of breath. Numbness in her face and left arm. EXAM: CHEST - 2 VIEW COMPARISON:  06/08/2017 FINDINGS: The heart size and mediastinal contours are within normal limits. Both lungs are clear. Previous anterior cervical fusion. IMPRESSION: No active cardiopulmonary disease. Electronically Signed   By: Francene Boyers M.D.   On: 10/14/2017 12:12   Ct Head Wo Contrast  Result Date: 10/14/2017 CLINICAL DATA:  Intermittent numbness of the face and left arm since last weekend. Fatigue. EXAM: CT HEAD WITHOUT CONTRAST TECHNIQUE: Contiguous axial images were obtained from the base of the skull through the vertex without intravenous contrast. COMPARISON:  MRI 04/17/2017, head CT 03/10/2017 FINDINGS: Brain: Mild involutional changes of the brain with chronic mild-to-moderate microvascular ischemic change of the periventricular white matter. Chronic bilateral basal ganglial lacunar infarcts, right more prominent than left. Chronic small right cerebellar infarct. No large vascular  territory infarct, hemorrhage or midline shift. No intra-axial mass nor extra-axial fluid collections. Midline fourth ventricle and basal cisterns. Vascular: Atherosclerotic calcifications of the distal vertebral arteries and carotid siphons bilaterally. No hyperdense vessel sign. Skull: No acute calvarial fracture or suspicious osseous lesions. Sinuses/Orbits: No acute finding. Other: None. IMPRESSION: 1. 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 2. No acute intracranial abnormality. Electronically Signed   By: Tollie Eth M.D.   On: 10/14/2017 14:41   Ct Angio Neck W And/or Wo Contrast  Result Date: 10/14/2017 CLINICAL DATA:  Intermittent numbness in the face since last weekend. EXAM: CT ANGIOGRAPHY HEAD AND NECK TECHNIQUE: Multidetector CT imaging of the head and  neck was performed using the standard protocol during bolus administration of intravenous contrast. Multiplanar CT image reconstructions and MIPs were obtained to evaluate the vascular anatomy. Carotid stenosis measurements (when applicable) are obtained utilizing NASCET criteria, using the distal internal carotid diameter as the denominator. CONTRAST:  50mL ISOVUE-370 IOPAMIDOL (ISOVUE-370) INJECTION 76% COMPARISON:  CT head earlier today.  CTA head neck 03/10/2017. FINDINGS: CTA NECK FINDINGS Aortic arch: Dolichoectasia.  No great vessel stenosis. Right carotid system: Calcified and noncalcified plaque at the carotid bifurcation extending into the RIGHT ICA. Luminal measurements of 2.8/3.8 proximal/distal correlate with a less than 50% stenosis. No dissection. Left carotid system: Calcified and noncalcified plaque at carotid bifurcation extending into the LEFT ICA. Luminal measurements of 3.1/4.4 proximal/distal correlate with a less than 50% stenosis. No dissection. Vertebral arteries: LEFT vertebral dominant/sole contributor. Prior RIGHT vertebral dissection without  significant reconstitution in the neck. Skeleton: Cervical spondylosis.  C4-C7 ACDF.  No acute findings. Other neck: No neck masses.  Airway midline. Upper chest: Mild vascular congestion. Review of the MIP images confirms the above findings CTA HEAD FINDINGS Anterior circulation: No significant stenosis, proximal occlusion, aneurysm, or vascular malformation. Posterior circulation: RIGHT vertebral in the neck and skull base is occluded as noted previously. BILATERAL PICA vessels are patent. There is some retrograde flow from the basilar which opacifies the RIGHT V4 distal most segment. The LEFT vertebral demonstrates circumferential calcification but no flow-limiting stenosis. Basilar artery dolichoectatic but widely patent. No cerebellar branch occlusion. No saccular aneurysm. Venous sinuses: Patent. Anatomic variants: None. Delayed phase: No abnormal enhancement. Review of the MIP images confirms the above findings IMPRESSION: Nonstenotic atheromatous change both carotid bifurcations. 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 No significant intracranial stenosis, dissection, or saccular aneurysm. No abnormal postcontrast enhancement. Electronically Signed   By: Elsie Stain M.D.   On: 10/14/2017 17:04   Mr Brain Wo Contrast  Result Date: 10/16/2017 CLINICAL DATA:  Recurrent left arm and face numbness and weakness. Left facial droop. EXAM: MRI HEAD WITHOUT CONTRAST TECHNIQUE: Multiplanar, multiecho pulse sequences of the brain and surrounding structures were obtained without intravenous contrast. COMPARISON:  MRI brain 10/14/2016. FINDINGS: Brain: A punctate nonhemorrhagic infarct is present within the lateral right thalamus. Focal T2 hyperintensity is associated. A more remote lacunar infarct is present in the left thalamus. A remote infarct is present in the tail of the caudate and bilaterally in the lentiform nucleus extending into the corona radiata.  Periventricular white matter changes are stable. No acute hemorrhage or mass lesion is present. White matter changes extend into the brainstem. Cerebellum is unremarkable. Vascular: Flow is present in the major intracranial arteries. Skull and upper cervical spine: The skull base is within normal limits. The craniocervical junction is normal. Cervical fusion is noted at C4-5. Marrow signal is otherwise normal. Sinuses/Orbits: The paranasal sinuses and mastoid air cells are clear. Globes and orbits are within normal limits. IMPRESSION: 1. Punctate acute nonhemorrhagic infarct involving the lateral right thalamus could account for the patient's symptoms. 2. Other remote lacunar infarcts of the basal ganglia bilaterally are stable. 3. Atrophy and white matter disease is stable. 4. Cervical fusion at C4-5. Electronically Signed   By: Marin Roberts M.D.   On: 10/16/2017 12:32   Mr Brain Wo Contrast  Result Date: 10/14/2017 CLINICAL DATA:  Initial evaluation for acute left-sided numbness. EXAM: MRI HEAD WITHOUT CONTRAST TECHNIQUE: Multiplanar, multiecho pulse sequences of the brain and surrounding structures were obtained without intravenous contrast. COMPARISON:  Prior CT and CTA from earlier the same day. FINDINGS: Brain: Generalized age-related cerebral atrophy. Patchy T2/FLAIR hyperintensity within the periventricular deep white matter both cerebral hemispheres, consistent with chronic small vessel ischemic change. Scattered remote lacunar infarcts present within the bilateral basal ganglia, right thalamus, and right corona radiata. No abnormal foci of restricted diffusion to suggest acute ischemic infarct. Minimal punctate diffusion abnormality within the posterior left centrum semi ovale seen on axial DWI sequence image 39 favored to be artifactual in nature/T2 shine through. Gray-white matter differentiation maintained. No other areas of remote chronic infarction. No evidence for acute or chronic  intracranial hemorrhage. No mass lesion, midline shift or mass effect. No hydrocephalus. No extra-axial fluid collection. Major dural sinuses grossly patent. Pituitary gland suprasellar region normal. Midline structures intact and normal. Vascular: Major intracranial vascular flow voids maintained. Skull and upper cervical spine: Craniocervical junction normal. Postsurgical changes partially visualize within the upper cervical spine. No marrow signal abnormality. Scalp soft tissues normal. Sinuses/Orbits: Globes and orbital soft tissues within normal limits. Paranasal sinuses are clear. Trace right mastoid effusion. Inner ear structures normal. Other: None. IMPRESSION: 1. No acute intracranial abnormality. 2. Mild chronic small vessel ischemic change with scattered remote lacunar infarcts as above. Electronically Signed   By: Rise Mu M.D.   On: 10/14/2017 20:25       LAB RESULTS: Basic Metabolic Panel: Recent Labs  Lab 10/15/17 0144 10/16/17 1209  NA 138 136  K 3.5 3.7  CL 101 101  CO2 28 24  GLUCOSE 325* 243*  BUN 16 17  CREATININE 1.24* 0.91  CALCIUM 9.1 9.0  MG  --  2.0   Liver Function Tests: Recent Labs  Lab 10/15/17 0144  AST 18  ALT 18  ALKPHOS 61  BILITOT 0.9  PROT 7.0  ALBUMIN 3.4*   No results for input(s): LIPASE, AMYLASE in the last 168 hours. No results for input(s): AMMONIA in the last 168 hours. CBC: Recent Labs  Lab 10/14/17 1130  WBC 8.2  HGB 13.9  HCT 40.5  MCV 93.8  PLT 245   Cardiac Enzymes: No results for input(s): CKTOTAL, CKMB, CKMBINDEX, TROPONINI in the last 168 hours. BNP: Invalid input(s): POCBNP CBG: Recent Labs  Lab 10/16/17 0618 10/16/17 1232  GLUCAP 177* 231*      Disposition and Follow-up: Discharge Instructions    Ambulatory referral to Neurology   Complete by:  As directed    An appointment is requested in approximately: 4-6 Week(s): Stroke   Diet - low sodium heart healthy   Complete by:  As directed     Increase activity slowly   Complete by:  As directed        DISPOSITION: Home   DISCHARGE FOLLOW-UP Follow-up Information    [place], Velna Hatchet, MD. Schedule an appointment as soon as possible for a visit in 2 week(s).   Specialty:  Family Medicine Contact information: [address] [place] [address] [phone]        Laqueta Linden, MD .   Specialty:  Cardiology Contact information: [address] [place] [address] [phone]        Marvel Plan, MD. Schedule an appointment as soon as possible for a visit in 6 week(s).   Specialty:  Neurology Contact information: [address] [place] [address] [phone]        Fransisco Hertz, MD. Schedule an appointment as soon as possible for a visit in 2 week(s).  Specialties:  Vascular Surgery, Cardiology Contact information: [address] [place] [address] [phone]            Time coordinating discharge:  35 minutes  Signed:   Thad Ranger M.D. [facility] 10/16/2017, 1:21 PM Pager: [phone]

## 2017-10-16 NOTE — Progress Notes (Signed)
PT Note  Pt seen and full eval note to follow. Recommend HHPT and rollator for dc home. Pt currently staying with her ex-husband in his home.  [employer] PT [phone]

## 2017-10-17 DIAGNOSIS — R69 Illness, unspecified: Secondary | ICD-10-CM | POA: Diagnosis not present

## 2017-10-17 DIAGNOSIS — I69992 Facial weakness following unspecified cerebrovascular disease: Secondary | ICD-10-CM | POA: Diagnosis not present

## 2017-10-17 DIAGNOSIS — I480 Paroxysmal atrial fibrillation: Secondary | ICD-10-CM | POA: Diagnosis not present

## 2017-10-17 DIAGNOSIS — I509 Heart failure, unspecified: Secondary | ICD-10-CM | POA: Diagnosis not present

## 2017-10-17 DIAGNOSIS — E785 Hyperlipidemia, unspecified: Secondary | ICD-10-CM | POA: Diagnosis not present

## 2017-10-17 DIAGNOSIS — K219 Gastro-esophageal reflux disease without esophagitis: Secondary | ICD-10-CM | POA: Diagnosis not present

## 2017-10-17 DIAGNOSIS — Z7982 Long term (current) use of aspirin: Secondary | ICD-10-CM | POA: Diagnosis not present

## 2017-10-17 DIAGNOSIS — I110 Hypertensive heart disease with heart failure: Secondary | ICD-10-CM | POA: Diagnosis not present

## 2017-10-17 DIAGNOSIS — I69354 Hemiplegia and hemiparesis following cerebral infarction affecting left non-dominant side: Secondary | ICD-10-CM | POA: Diagnosis not present

## 2017-10-17 DIAGNOSIS — E119 Type 2 diabetes mellitus without complications: Secondary | ICD-10-CM | POA: Diagnosis not present

## 2017-10-19 ENCOUNTER — Encounter: Payer: Self-pay | Admitting: Cardiovascular Disease

## 2017-10-19 ENCOUNTER — Ambulatory Visit (INDEPENDENT_AMBULATORY_CARE_PROVIDER_SITE_OTHER): Payer: Medicare HMO | Admitting: Cardiovascular Disease

## 2017-10-19 VITALS — BP 110/76 | HR 100 | Wt 200.0 lb

## 2017-10-19 DIAGNOSIS — E119 Type 2 diabetes mellitus without complications: Secondary | ICD-10-CM | POA: Diagnosis not present

## 2017-10-19 DIAGNOSIS — I639 Cerebral infarction, unspecified: Secondary | ICD-10-CM | POA: Diagnosis not present

## 2017-10-19 DIAGNOSIS — E785 Hyperlipidemia, unspecified: Secondary | ICD-10-CM

## 2017-10-19 DIAGNOSIS — Z7982 Long term (current) use of aspirin: Secondary | ICD-10-CM | POA: Diagnosis not present

## 2017-10-19 DIAGNOSIS — I480 Paroxysmal atrial fibrillation: Secondary | ICD-10-CM | POA: Diagnosis not present

## 2017-10-19 DIAGNOSIS — I5032 Chronic diastolic (congestive) heart failure: Secondary | ICD-10-CM | POA: Diagnosis not present

## 2017-10-19 DIAGNOSIS — K219 Gastro-esophageal reflux disease without esophagitis: Secondary | ICD-10-CM | POA: Diagnosis not present

## 2017-10-19 DIAGNOSIS — I6523 Occlusion and stenosis of bilateral carotid arteries: Secondary | ICD-10-CM | POA: Diagnosis not present

## 2017-10-19 DIAGNOSIS — I110 Hypertensive heart disease with heart failure: Secondary | ICD-10-CM | POA: Diagnosis not present

## 2017-10-19 DIAGNOSIS — I509 Heart failure, unspecified: Secondary | ICD-10-CM | POA: Diagnosis not present

## 2017-10-19 DIAGNOSIS — I69354 Hemiplegia and hemiparesis following cerebral infarction affecting left non-dominant side: Secondary | ICD-10-CM | POA: Diagnosis not present

## 2017-10-19 DIAGNOSIS — Z7901 Long term (current) use of anticoagulants: Secondary | ICD-10-CM

## 2017-10-19 DIAGNOSIS — I69992 Facial weakness following unspecified cerebrovascular disease: Secondary | ICD-10-CM | POA: Diagnosis not present

## 2017-10-19 DIAGNOSIS — I10 Essential (primary) hypertension: Secondary | ICD-10-CM

## 2017-10-19 DIAGNOSIS — R69 Illness, unspecified: Secondary | ICD-10-CM | POA: Diagnosis not present

## 2017-10-19 NOTE — Patient Instructions (Signed)
Your physician wants you to follow-up in:  6 months with Dr.Koneswaran You will receive a reminder letter in the mail two months in advance. If you don't receive a letter, please call our office to schedule the follow-up appointment.    Your physician recommends that you continue on your current medications as directed. Please refer to the Current Medication list given to you today.    If you need a refill on your cardiac medications before your next appointment, please call your pharmacy.      No lab work or tests ordered    Thank you for choosing [facility] !

## 2017-10-19 NOTE — Progress Notes (Signed)
SUBJECTIVE: The patient presents for posthospitalization follow-up.  She recently sustained an acute nonhemorrhagic right thalamic punctate infarct.  I reviewed the echocardiogram performed on 10/16/2017 which showed normal left ventricular systolic function and regional wall motion, LVEF 60 to 65%, moderate LVH, and mild mitral and tricuspid regurgitation.  There is moderate left atrial dilatation.  Carotid Doppler showed 40 to 59% right internal carotid artery stenosis in 1 to 39% left internal carotid artery stenosis.  She also has paroxysmal atrial fibrillation and chronic diastolic heart failure.  She has some mild dysarthria.  Her sister also had a stroke.  She denies chest pain and palpitations.    Review of Systems: As per "subjective", otherwise negative.  Allergies  Allergen Reactions  . Cefuroxime Axetil Other (See Comments)    REACTION: unspecified  . Fentanyl And Related Other (See Comments)    Patch: unknown reaction  . Niaspan [Niacin Er] Other (See Comments)    Burning all over  . Sulfa Antibiotics Nausea And Vomiting  . Vasotec [Enalapril] Cough  . Welchol [Colesevelam Hcl] Other (See Comments)    Muscle aches and joint pains  . Lyrica [Pregabalin] Rash    Blistering rash around same time as starting drug  . Penicillins Hives and Rash    Has patient had a PCN reaction causing immediate rash, facial/tongue/throat swelling, SOB or lightheadedness with hypotension: Yes Has patient had a PCN reaction causing severe rash involving mucus membranes or skin necrosis: Yes Has patient had a PCN reaction that required hospitalization: No Has patient had a PCN reaction occurring within the last 10 years: No If all of the above answers are "NO", then may proceed with Cephalosporin use.     Current Outpatient Medications  Medication Sig Dispense Refill  . albuterol (PROVENTIL HFA;VENTOLIN HFA) 108 (90 Base) MCG/ACT inhaler Inhale 2 puffs into the lungs every 6  (six) hours as needed for wheezing or shortness of breath. 1 Inhaler 0  . apixaban (ELIQUIS) 5 MG TABS tablet Take 1 tablet (5 mg total) by mouth 2 (two) times daily. 180 tablet 3  . aspirin EC 81 MG EC tablet Take 1 tablet (81 mg total) by mouth daily. 30 tablet 4  . atorvastatin (LIPITOR) 80 MG tablet Take 1 tablet (80 mg total) by mouth at bedtime. 30 tablet 4  . benzonatate (TESSALON) 100 MG capsule Take 1 capsule (100 mg total) by mouth 3 (three) times daily as needed for cough. 20 capsule 0  . clotrimazole-betamethasone (LOTRISONE) cream Apply 1 application topically 2 (two) times daily. (Patient taking differently: Apply 1 application topically daily as needed (itching). ) 60 g 1  . fenofibrate 54 MG tablet  BY MOUTH ONCE DAILY IN THE MORNING (Patient taking differently: Take 54 mg by mouth daily. ) 90 tablet 3  . furosemide (LASIX) 40 MG tablet Take 1 tablet (40 mg total) by mouth 2 (two) times daily as needed for fluid. Take 1 tablet twice a day (Patient taking differently: Take 40 mg by mouth 2 (two) times daily as needed for fluid. )    . gabapentin (NEURONTIN) 300 MG capsule Take 1 capsule (300 mg total) by mouth at bedtime. 30 capsule 3  . Insulin Glargine (BASAGLAR KWIKPEN) 100 UNIT/ML SOPN Inject 1.2 mLs (120 Units total) into the skin every morning. And pen needles 2/day (Patient taking differently: Inject 120 Units into the skin daily at 10 pm. ) 15 pen 11  . ipratropium (ATROVENT) 0.02 % nebulizer solution Take 2.5  mLs (0.5 mg total) by nebulization every 4 (four) hours as needed for wheezing or shortness of breath. 75 mL 3  . ipratropium (ATROVENT) 0.06 % nasal spray Place 2 sprays into both nostrils 4 (four) times daily as needed for rhinitis. 15 mL 3  . montelukast (SINGULAIR) 10 MG tablet  BY MOUTH ONCE DAILY (Patient taking differently: Take 10 mg by mouth daily. ) 90 tablet 3  . nystatin (MYCOSTATIN/NYSTOP) powder Apply topically 4 (four) times daily. (Patient taking  differently: Apply 1 g topically 4 (four) times daily as needed (rash). ) 180 g 2  . Omega-3 Fatty Acids (FISH OIL) 1000 MG CAPS Take 2 capsules (2,000 mg total) by mouth 2 (two) times daily. 360 capsule 3  . pantoprazole (PROTONIX) 40 MG tablet Take 1 tablet (40 mg total) by mouth daily. 90 tablet 3  . PARoxetine (PAXIL) 40 MG tablet  BY MOUTH ONCE DAILY (Patient taking differently: Take 40 mg by mouth daily. ) 90 tablet 3  . TRUEPLUS INSULIN SYRINGE 31G X 5/16" 1 ML MISC USE AS DIRECTED TO INJECT 100 each 2  . losartan (COZAAR) 50 MG tablet Take 1 tablet (50 mg total) by mouth daily. (Patient taking differently: Take 25 mg by mouth daily. ) 90 tablet 3   No current facility-administered medications for this visit.     Past Medical History:  Diagnosis Date  . Atrial fibrillation (HCC)    a. diagnosed in 05/2017 --> started on Eliquis for anticoagulation  . Carotid artery occlusion   . CHF (congestive heart failure) (HCC)   . Depression   . Diabetes mellitus   . Fibromyalgia   . GERD (gastroesophageal reflux disease)   . Hyperlipidemia   . Hypertension   . Stroke (HCC)   . Superficial thrombophlebitis    right leg    Past Surgical History:  Procedure Laterality Date  . CHOLECYSTECTOMY  1991  . HERNIA REPAIR  approx 1969  . NECK SURGERY  1998  . TONSILLECTOMY AND ADENOIDECTOMY  1966    Social History   Socioeconomic History  . Marital status: Married    Spouse name: Not on file  . Number of children: Not on file  . Years of education: Not on file  . Highest education level: Not on file  Occupational History  . Not on file  Social Needs  . Financial resource strain: Not hard at all  . Food insecurity:    Worry: Never true    Inability: Never true  . Transportation needs:    Medical: No    Non-medical: No  Tobacco Use  . Smoking status: Former Smoker    Types: Cigarettes  . Smokeless tobacco: Never Used  Substance and Sexual Activity  . Alcohol use: No  .  Drug use: No  . Sexual activity: Not Currently  Lifestyle  . Physical activity:    Days per week: Not on file    Minutes per session: Not on file  . Stress: Not on file  Relationships  . Social connections:    Talks on phone: Not on file    Gets together: Not on file    Attends religious service: Not on file    Active member of club or organization: Not on file    Attends meetings of clubs or organizations: Not on file    Relationship status: Not on file  . Intimate partner violence:    Fear of current or ex partner: Not on file    Emotionally abused:  Not on file    Physically abused: Not on file    Forced sexual activity: Not on file  Other Topics Concern  . Not on file  Social History Narrative  . Not on file     Vitals:   10/19/17 1245  BP: 110/76  Pulse: 100  SpO2: 97%  Weight: 200 lb (90.7 kg)    Wt Readings from Last 3 Encounters:  10/19/17 200 lb (90.7 kg)  10/16/17 199 lb 15.3 oz (90.7 kg)  10/14/17 201 lb 9.6 oz (91.4 kg)     PHYSICAL EXAM General: NAD HEENT: Normal. Neck: No JVD, no thyromegaly. Lungs: Clear to auscultation bilaterally with normal respiratory effort. CV: Regular rate and rhythm, normal S1/S2, no S3/S4, no murmur. No pretibial or periankle edema.    Abdomen: Soft, nontender, no distention.  Neurologic: Alert and oriented.  Mild dysarthria. Psych: Normal affect. Skin: Normal. Musculoskeletal: No gross deformities.    ECG: Most recent ECG reviewed.   Labs: Lab Results  Component Value Date/Time   K 3.7 10/16/2017 12:09 PM   BUN 17 10/16/2017 12:09 PM   CREATININE 0.91 10/16/2017 12:09 PM   CREATININE 1.06 (H) 10/04/2017 12:47 PM   ALT 18 10/15/2017 01:44 AM   TSH 4.142 06/08/2017 08:50 AM   TSH 2.89 04/07/2016 10:52 AM   HGB 13.9 10/14/2017 11:30 AM     Lipids: Lab Results  Component Value Date/Time   LDLCALC 87 10/16/2017 04:11 AM   LDLCALC 121 (H) 08/17/2017 10:22 AM   CHOL 181 10/16/2017 04:11 AM   TRIG 253 (H)  10/16/2017 04:11 AM   HDL 43 10/16/2017 04:11 AM       ASSESSMENT AND PLAN: 1.  Chronic diastolic heart failure: Euvolemic and symptomatically stable.  No changes to therapy.  Blood pressure is normal.  2.  Paroxysmal atrial fibrillation: Symptomatically stable.  Systemically anticoagulated with Eliquis 5 mg twice daily.  3.  Chronic hypertension: Blood pressure is normal.  No changes to therapy.  She is on losartan 25 mg daily.  4.  Hyperlipidemia: Continue Lipitor 80 mg.  LDL 87 on 10/16/2017.  Crestor was switched to Lipitor while hospitalized.  5.  Bilateral carotid artery disease: Doppler reviewed above.  6.  CVA: Continue aspirin and Lipitor.  She has some mild dysarthria.    Disposition: Follow up 6 months   Prentice Docker, M.D., F.A.C.C.

## 2017-10-20 ENCOUNTER — Telehealth: Payer: Self-pay | Admitting: *Deleted

## 2017-10-20 DIAGNOSIS — I110 Hypertensive heart disease with heart failure: Secondary | ICD-10-CM | POA: Diagnosis not present

## 2017-10-20 DIAGNOSIS — E785 Hyperlipidemia, unspecified: Secondary | ICD-10-CM | POA: Diagnosis not present

## 2017-10-20 DIAGNOSIS — Z7982 Long term (current) use of aspirin: Secondary | ICD-10-CM | POA: Diagnosis not present

## 2017-10-20 DIAGNOSIS — R69 Illness, unspecified: Secondary | ICD-10-CM | POA: Diagnosis not present

## 2017-10-20 DIAGNOSIS — E119 Type 2 diabetes mellitus without complications: Secondary | ICD-10-CM | POA: Diagnosis not present

## 2017-10-20 DIAGNOSIS — K219 Gastro-esophageal reflux disease without esophagitis: Secondary | ICD-10-CM | POA: Diagnosis not present

## 2017-10-20 DIAGNOSIS — I69992 Facial weakness following unspecified cerebrovascular disease: Secondary | ICD-10-CM | POA: Diagnosis not present

## 2017-10-20 DIAGNOSIS — I509 Heart failure, unspecified: Secondary | ICD-10-CM | POA: Diagnosis not present

## 2017-10-20 DIAGNOSIS — I69354 Hemiplegia and hemiparesis following cerebral infarction affecting left non-dominant side: Secondary | ICD-10-CM | POA: Diagnosis not present

## 2017-10-20 DIAGNOSIS — I480 Paroxysmal atrial fibrillation: Secondary | ICD-10-CM | POA: Diagnosis not present

## 2017-10-20 NOTE — Telephone Encounter (Signed)
Received call from Elnita Maxwell [facility] SN with [facility] [phone]~ telephone.  Requested VO for [facility] SN te see patient 2x weekly x2 weeks, then 1x weekly x4 weeks for post hospitalization SN. Also requested VO for [facility] aide to see patient 2x weekly x3 weeks.   VO given.

## 2017-10-20 NOTE — Telephone Encounter (Signed)
Agree with above 

## 2017-10-21 ENCOUNTER — Other Ambulatory Visit: Payer: Self-pay

## 2017-10-21 ENCOUNTER — Telehealth: Payer: Self-pay | Admitting: Endocrinology

## 2017-10-21 MED ORDER — INSULIN GLARGINE 100 UNIT/ML SOLOSTAR PEN: 120.0000 [IU] | PEN_INJECTOR | SUBCUTANEOUS | 11 refills | Status: DC

## 2017-10-21 MED ORDER — INSULIN GLARGINE 100 UNIT/ML SOLOSTAR PEN: PEN_INJECTOR | SUBCUTANEOUS | 2 refills | Status: DC

## 2017-10-21 NOTE — Telephone Encounter (Signed)
I have sent a prescription to your pharmacy, to change to lantus, to see if that is cheaper.  If not, you should buy NPH, which is $25 per bottle.

## 2017-10-21 NOTE — Telephone Encounter (Signed)
Patient having some medication issues because the Health department is no longer helping her get her insulin- patient stated she needs Novolog and Lantus but I do not see these in her chart- I did see that on last note MD advised she take 120 units of basaglar every morning but did not see a Novolog dosage- please advise on what this patient is supposed to be taking because she just got out of the hospital due to a stroke and wants to know what she should do about her blood sugar moving forward- I did advise the patient to keep a log of her blood sugar readings over the weekend to make sure no changes need to be made

## 2017-10-21 NOTE — Telephone Encounter (Signed)
Patient stated that she is almost out of her insulin. She states that this is NOVOLOG. And states she can not get this refilled. She states she has some long lasting insulin if she can use that   Please advise

## 2017-10-24 ENCOUNTER — Other Ambulatory Visit: Payer: Self-pay

## 2017-10-24 DIAGNOSIS — K219 Gastro-esophageal reflux disease without esophagitis: Secondary | ICD-10-CM | POA: Diagnosis not present

## 2017-10-24 DIAGNOSIS — E785 Hyperlipidemia, unspecified: Secondary | ICD-10-CM | POA: Diagnosis not present

## 2017-10-24 DIAGNOSIS — I110 Hypertensive heart disease with heart failure: Secondary | ICD-10-CM | POA: Diagnosis not present

## 2017-10-24 DIAGNOSIS — E119 Type 2 diabetes mellitus without complications: Secondary | ICD-10-CM | POA: Diagnosis not present

## 2017-10-24 DIAGNOSIS — I69354 Hemiplegia and hemiparesis following cerebral infarction affecting left non-dominant side: Secondary | ICD-10-CM | POA: Diagnosis not present

## 2017-10-24 DIAGNOSIS — R69 Illness, unspecified: Secondary | ICD-10-CM | POA: Diagnosis not present

## 2017-10-24 DIAGNOSIS — I509 Heart failure, unspecified: Secondary | ICD-10-CM | POA: Diagnosis not present

## 2017-10-24 DIAGNOSIS — I69992 Facial weakness following unspecified cerebrovascular disease: Secondary | ICD-10-CM | POA: Diagnosis not present

## 2017-10-24 DIAGNOSIS — I480 Paroxysmal atrial fibrillation: Secondary | ICD-10-CM | POA: Diagnosis not present

## 2017-10-24 DIAGNOSIS — Z7982 Long term (current) use of aspirin: Secondary | ICD-10-CM | POA: Diagnosis not present

## 2017-10-24 MED ORDER — BASAGLAR KWIKPEN 100 UNIT/ML ~~LOC~~ SOPN: 120.0000 [IU] | PEN_INJECTOR | Freq: Every morning | SUBCUTANEOUS | 11 refills | Status: DC

## 2017-10-25 ENCOUNTER — Other Ambulatory Visit: Payer: Self-pay

## 2017-10-25 DIAGNOSIS — R69 Illness, unspecified: Secondary | ICD-10-CM | POA: Diagnosis not present

## 2017-10-25 DIAGNOSIS — I69354 Hemiplegia and hemiparesis following cerebral infarction affecting left non-dominant side: Secondary | ICD-10-CM | POA: Diagnosis not present

## 2017-10-25 DIAGNOSIS — E119 Type 2 diabetes mellitus without complications: Secondary | ICD-10-CM | POA: Diagnosis not present

## 2017-10-25 DIAGNOSIS — I509 Heart failure, unspecified: Secondary | ICD-10-CM | POA: Diagnosis not present

## 2017-10-25 DIAGNOSIS — K219 Gastro-esophageal reflux disease without esophagitis: Secondary | ICD-10-CM | POA: Diagnosis not present

## 2017-10-25 DIAGNOSIS — I480 Paroxysmal atrial fibrillation: Secondary | ICD-10-CM | POA: Diagnosis not present

## 2017-10-25 DIAGNOSIS — I69992 Facial weakness following unspecified cerebrovascular disease: Secondary | ICD-10-CM | POA: Diagnosis not present

## 2017-10-25 DIAGNOSIS — Z7982 Long term (current) use of aspirin: Secondary | ICD-10-CM | POA: Diagnosis not present

## 2017-10-25 DIAGNOSIS — I110 Hypertensive heart disease with heart failure: Secondary | ICD-10-CM | POA: Diagnosis not present

## 2017-10-25 DIAGNOSIS — E785 Hyperlipidemia, unspecified: Secondary | ICD-10-CM | POA: Diagnosis not present

## 2017-10-25 NOTE — Telephone Encounter (Signed)
Pt is aware.  

## 2017-10-26 ENCOUNTER — Telehealth: Payer: Self-pay | Admitting: *Deleted

## 2017-10-26 DIAGNOSIS — E119 Type 2 diabetes mellitus without complications: Secondary | ICD-10-CM | POA: Diagnosis not present

## 2017-10-26 DIAGNOSIS — I509 Heart failure, unspecified: Secondary | ICD-10-CM | POA: Diagnosis not present

## 2017-10-26 DIAGNOSIS — K219 Gastro-esophageal reflux disease without esophagitis: Secondary | ICD-10-CM | POA: Diagnosis not present

## 2017-10-26 DIAGNOSIS — E785 Hyperlipidemia, unspecified: Secondary | ICD-10-CM | POA: Diagnosis not present

## 2017-10-26 DIAGNOSIS — R69 Illness, unspecified: Secondary | ICD-10-CM | POA: Diagnosis not present

## 2017-10-26 DIAGNOSIS — I480 Paroxysmal atrial fibrillation: Secondary | ICD-10-CM | POA: Diagnosis not present

## 2017-10-26 DIAGNOSIS — Z7982 Long term (current) use of aspirin: Secondary | ICD-10-CM | POA: Diagnosis not present

## 2017-10-26 DIAGNOSIS — I69354 Hemiplegia and hemiparesis following cerebral infarction affecting left non-dominant side: Secondary | ICD-10-CM | POA: Diagnosis not present

## 2017-10-26 DIAGNOSIS — I69992 Facial weakness following unspecified cerebrovascular disease: Secondary | ICD-10-CM | POA: Diagnosis not present

## 2017-10-26 DIAGNOSIS — I110 Hypertensive heart disease with heart failure: Secondary | ICD-10-CM | POA: Diagnosis not present

## 2017-10-26 NOTE — Telephone Encounter (Signed)
Agree with above 

## 2017-10-26 NOTE — Telephone Encounter (Signed)
[facility] [phone]~ telephone. States that OT evaluation completed. Requested order for OT 2x weekly x3 weeks for ADL's. VO given.   [facility] [phone]~ telephone. States that PT evaluation completed. Requested orders for PT 2x weekly x3 weeks for gait, balance, strengthening and L sided weakness. VO given.   [facility]. States that patient has some dysphasia and is biting mouth on L side while chewing. VO given.

## 2017-10-27 DIAGNOSIS — Z7982 Long term (current) use of aspirin: Secondary | ICD-10-CM | POA: Diagnosis not present

## 2017-10-27 DIAGNOSIS — I110 Hypertensive heart disease with heart failure: Secondary | ICD-10-CM | POA: Diagnosis not present

## 2017-10-27 DIAGNOSIS — R69 Illness, unspecified: Secondary | ICD-10-CM | POA: Diagnosis not present

## 2017-10-27 DIAGNOSIS — I480 Paroxysmal atrial fibrillation: Secondary | ICD-10-CM | POA: Diagnosis not present

## 2017-10-27 DIAGNOSIS — E785 Hyperlipidemia, unspecified: Secondary | ICD-10-CM | POA: Diagnosis not present

## 2017-10-27 DIAGNOSIS — K219 Gastro-esophageal reflux disease without esophagitis: Secondary | ICD-10-CM | POA: Diagnosis not present

## 2017-10-27 DIAGNOSIS — I69354 Hemiplegia and hemiparesis following cerebral infarction affecting left non-dominant side: Secondary | ICD-10-CM | POA: Diagnosis not present

## 2017-10-27 DIAGNOSIS — I509 Heart failure, unspecified: Secondary | ICD-10-CM | POA: Diagnosis not present

## 2017-10-27 DIAGNOSIS — I69992 Facial weakness following unspecified cerebrovascular disease: Secondary | ICD-10-CM | POA: Diagnosis not present

## 2017-10-27 DIAGNOSIS — E119 Type 2 diabetes mellitus without complications: Secondary | ICD-10-CM | POA: Diagnosis not present

## 2017-10-28 ENCOUNTER — Telehealth: Payer: Self-pay | Admitting: *Deleted

## 2017-10-28 ENCOUNTER — Ambulatory Visit (INDEPENDENT_AMBULATORY_CARE_PROVIDER_SITE_OTHER): Payer: Medicare HMO | Admitting: Family Medicine

## 2017-10-28 ENCOUNTER — Encounter: Payer: Self-pay | Admitting: Family Medicine

## 2017-10-28 ENCOUNTER — Other Ambulatory Visit: Payer: Self-pay

## 2017-10-28 VITALS — BP 122/68 | HR 82 | Temp 98.7°F | Resp 16 | Ht 59.0 in | Wt 197.0 lb

## 2017-10-28 DIAGNOSIS — E11649 Type 2 diabetes mellitus with hypoglycemia without coma: Secondary | ICD-10-CM | POA: Diagnosis not present

## 2017-10-28 DIAGNOSIS — I6930 Unspecified sequelae of cerebral infarction: Secondary | ICD-10-CM

## 2017-10-28 DIAGNOSIS — M797 Fibromyalgia: Secondary | ICD-10-CM

## 2017-10-28 DIAGNOSIS — E7800 Pure hypercholesterolemia, unspecified: Secondary | ICD-10-CM | POA: Diagnosis not present

## 2017-10-28 MED ORDER — GABAPENTIN 100 MG PO CAPS: 100.0000 mg | ORAL_CAPSULE | Freq: Every day | ORAL | 3 refills | Status: DC

## 2017-10-28 NOTE — Progress Notes (Signed)
Subjective:    Patient ID: Tina [name], female    DOB: 10-Nov-1950, 67 y.o.   MRN: [id number]  Patient presents for Hospital f/U (TIA)  Pt here for hospital follow up - here with Darcel BayleyLeonard, gentleman she lives with and brings her to appt  She presented with left sided facial numbness, left arm numbness after having symptoms in vascular office  , MRI showed punctuate acute non hemorrhagic infarct on right thalamic side  Now on lipitor 80mg  , crestor stopped??  / ASA 81mg   She has weakness in left arm/ perioral numbness still   Has home health PT twice a day , using walker out of her home, does not have room in house for walker  Getting meals heart healthy/low sugar from [employer]  Has HH Nurse, checking CBG/blood pressure  Speech therapy  Added ASA 81  DM- taking basalgar $120 in the morning , no longer doing meal time shots,   Has appt 28th of June for Dr. Everardo AllEllison CBG 147 this am     OFF PAIN MEDICINE    States gabapentin makes her very sleep, taking 300mg  at bedtime wants to try lower dose   Taking lasix twice a day  Taking off metoprolol due to low BP     Review Of Systems: per above   GEN- denies fatigue, fever, weight loss,weakness, recent illness HEENT- denies eye drainage, change in vision, nasal discharge, CVS- denies chest pain, palpitations RESP- denies SOB, cough, wheeze ABD- denies N/V, change in stools, abd pain GU- denies dysuria, hematuria, dribbling, incontinence MSK-+ joint pain, muscle aches, injury Neuro- denies headache, dizziness, syncope, seizure activity       Objective:    BP 122/68   Pulse 82   Temp 98.7 F (37.1 C) (Oral)   Resp 16   Ht 4\' 11"  (1.499 m)   Wt 197 lb (89.4 kg)   SpO2 97%   BMI 39.79 kg/m  GEN- NAD, alert and oriented x3 HEENT- PERRL, EOMI, non injected sclera, pink conjunctiva, MMM, oropharynx clear Neck- Supple, no thyromegaly CVS- irregular rythem, normal rate , no murmur RESP-CTAB ABD-NABS,soft,NT,ND Neuro- CNII-XII  grossly in tact, decreased sensation left perioral region, decreased strength LUE, walking unassisted  EXT- No edema Pulses- Radial 2+        Assessment & Plan:      Problem List Items Addressed This Visit      Unprioritized   Diabetes mellitus type II, uncontrolled (HCC) - Primary    Unfortunately she has had another event secondary to her risk factors being uncontrolled.  She states that she is now taking all of her medicines as prescribed and and on the correct time.  Discussed the falls that she will need with her endocrinologist as well as neurologist with her and her friend here in the office.  She did have her insulin box with her to show me as well as her readings.  I think that if she is consistent with her medications her diabetes blood pressure will improve.      Fibromyalgia    Discussed no narcotics a this time as to not worsen gait or sedation after stroke Gabapentin reduced to 100mg  at bedtime       History of cerebrovascular accident (CVA) with residual deficit    She is on her blood thinners as prescribed  She will continue PT/OT/ST Blood pressure looks good Ambulating without assistance       Hyperlipidemia    Statin changed to lipitor 80mg ,  she was on crestor 40mg           Note: This dictation was prepared with Dragon dictation along with smaller phrase technology. Any transcriptional errors that result from this process are unintentional.

## 2017-10-28 NOTE — Assessment & Plan Note (Signed)
She is on her blood thinners as prescribed  She will continue PT/OT/ST Blood pressure looks good Ambulating without assistance

## 2017-10-28 NOTE — Assessment & Plan Note (Signed)
Unfortunately she has had another event secondary to her risk factors being uncontrolled.  She states that she is now taking all of her medicines as prescribed and and on the correct time.  Discussed the falls that she will need with her endocrinologist as well as neurologist with her and her friend here in the office.  She did have her insulin box with her to show me as well as her readings.  I think that if she is consistent with her medications her diabetes blood pressure will improve.

## 2017-10-28 NOTE — Telephone Encounter (Signed)
[facility] [phone]~ telephone.   Requested orders for ST 1x weekly x1 week, then 2x weekly x1 week, then 1x weekly x1 week for dysarthria.   VO given.

## 2017-10-28 NOTE — Assessment & Plan Note (Signed)
Statin changed to lipitor 80mg , she was on crestor 40mg 

## 2017-10-28 NOTE — Telephone Encounter (Signed)
noted 

## 2017-10-28 NOTE — Patient Instructions (Signed)
F/U as previous  Changed gabaentin 100mg  at bedtime  No pain medicine

## 2017-10-28 NOTE — Assessment & Plan Note (Signed)
Discussed no narcotics a this time as to not worsen gait or sedation after stroke Gabapentin reduced to 100mg  at bedtime

## 2017-10-29 DIAGNOSIS — R69 Illness, unspecified: Secondary | ICD-10-CM | POA: Diagnosis not present

## 2017-10-29 DIAGNOSIS — I480 Paroxysmal atrial fibrillation: Secondary | ICD-10-CM | POA: Diagnosis not present

## 2017-10-29 DIAGNOSIS — K219 Gastro-esophageal reflux disease without esophagitis: Secondary | ICD-10-CM | POA: Diagnosis not present

## 2017-10-29 DIAGNOSIS — I69354 Hemiplegia and hemiparesis following cerebral infarction affecting left non-dominant side: Secondary | ICD-10-CM | POA: Diagnosis not present

## 2017-10-29 DIAGNOSIS — I509 Heart failure, unspecified: Secondary | ICD-10-CM | POA: Diagnosis not present

## 2017-10-29 DIAGNOSIS — E785 Hyperlipidemia, unspecified: Secondary | ICD-10-CM | POA: Diagnosis not present

## 2017-10-29 DIAGNOSIS — I69992 Facial weakness following unspecified cerebrovascular disease: Secondary | ICD-10-CM | POA: Diagnosis not present

## 2017-10-29 DIAGNOSIS — E119 Type 2 diabetes mellitus without complications: Secondary | ICD-10-CM | POA: Diagnosis not present

## 2017-10-29 DIAGNOSIS — Z7982 Long term (current) use of aspirin: Secondary | ICD-10-CM | POA: Diagnosis not present

## 2017-10-29 DIAGNOSIS — I110 Hypertensive heart disease with heart failure: Secondary | ICD-10-CM | POA: Diagnosis not present

## 2017-10-31 DIAGNOSIS — I110 Hypertensive heart disease with heart failure: Secondary | ICD-10-CM | POA: Diagnosis not present

## 2017-10-31 DIAGNOSIS — K219 Gastro-esophageal reflux disease without esophagitis: Secondary | ICD-10-CM | POA: Diagnosis not present

## 2017-10-31 DIAGNOSIS — I69992 Facial weakness following unspecified cerebrovascular disease: Secondary | ICD-10-CM | POA: Diagnosis not present

## 2017-10-31 DIAGNOSIS — I480 Paroxysmal atrial fibrillation: Secondary | ICD-10-CM | POA: Diagnosis not present

## 2017-10-31 DIAGNOSIS — I69354 Hemiplegia and hemiparesis following cerebral infarction affecting left non-dominant side: Secondary | ICD-10-CM | POA: Diagnosis not present

## 2017-10-31 DIAGNOSIS — E119 Type 2 diabetes mellitus without complications: Secondary | ICD-10-CM | POA: Diagnosis not present

## 2017-10-31 DIAGNOSIS — I509 Heart failure, unspecified: Secondary | ICD-10-CM | POA: Diagnosis not present

## 2017-10-31 DIAGNOSIS — Z7982 Long term (current) use of aspirin: Secondary | ICD-10-CM | POA: Diagnosis not present

## 2017-10-31 DIAGNOSIS — R69 Illness, unspecified: Secondary | ICD-10-CM | POA: Diagnosis not present

## 2017-10-31 DIAGNOSIS — E785 Hyperlipidemia, unspecified: Secondary | ICD-10-CM | POA: Diagnosis not present

## 2017-11-01 DIAGNOSIS — I480 Paroxysmal atrial fibrillation: Secondary | ICD-10-CM | POA: Diagnosis not present

## 2017-11-01 DIAGNOSIS — E785 Hyperlipidemia, unspecified: Secondary | ICD-10-CM | POA: Diagnosis not present

## 2017-11-01 DIAGNOSIS — I69992 Facial weakness following unspecified cerebrovascular disease: Secondary | ICD-10-CM | POA: Diagnosis not present

## 2017-11-01 DIAGNOSIS — Z7982 Long term (current) use of aspirin: Secondary | ICD-10-CM | POA: Diagnosis not present

## 2017-11-01 DIAGNOSIS — I69354 Hemiplegia and hemiparesis following cerebral infarction affecting left non-dominant side: Secondary | ICD-10-CM | POA: Diagnosis not present

## 2017-11-01 DIAGNOSIS — I509 Heart failure, unspecified: Secondary | ICD-10-CM | POA: Diagnosis not present

## 2017-11-01 DIAGNOSIS — E119 Type 2 diabetes mellitus without complications: Secondary | ICD-10-CM | POA: Diagnosis not present

## 2017-11-01 DIAGNOSIS — K219 Gastro-esophageal reflux disease without esophagitis: Secondary | ICD-10-CM | POA: Diagnosis not present

## 2017-11-01 DIAGNOSIS — I110 Hypertensive heart disease with heart failure: Secondary | ICD-10-CM | POA: Diagnosis not present

## 2017-11-01 DIAGNOSIS — R69 Illness, unspecified: Secondary | ICD-10-CM | POA: Diagnosis not present

## 2017-11-03 DIAGNOSIS — R69 Illness, unspecified: Secondary | ICD-10-CM | POA: Diagnosis not present

## 2017-11-03 DIAGNOSIS — I480 Paroxysmal atrial fibrillation: Secondary | ICD-10-CM | POA: Diagnosis not present

## 2017-11-03 DIAGNOSIS — E119 Type 2 diabetes mellitus without complications: Secondary | ICD-10-CM | POA: Diagnosis not present

## 2017-11-03 DIAGNOSIS — K219 Gastro-esophageal reflux disease without esophagitis: Secondary | ICD-10-CM | POA: Diagnosis not present

## 2017-11-03 DIAGNOSIS — I69354 Hemiplegia and hemiparesis following cerebral infarction affecting left non-dominant side: Secondary | ICD-10-CM | POA: Diagnosis not present

## 2017-11-03 DIAGNOSIS — Z7982 Long term (current) use of aspirin: Secondary | ICD-10-CM | POA: Diagnosis not present

## 2017-11-03 DIAGNOSIS — I69992 Facial weakness following unspecified cerebrovascular disease: Secondary | ICD-10-CM | POA: Diagnosis not present

## 2017-11-03 DIAGNOSIS — E785 Hyperlipidemia, unspecified: Secondary | ICD-10-CM | POA: Diagnosis not present

## 2017-11-03 DIAGNOSIS — I110 Hypertensive heart disease with heart failure: Secondary | ICD-10-CM | POA: Diagnosis not present

## 2017-11-03 DIAGNOSIS — I509 Heart failure, unspecified: Secondary | ICD-10-CM | POA: Diagnosis not present

## 2017-11-04 ENCOUNTER — Ambulatory Visit: Payer: Medicare HMO | Admitting: Vascular Surgery

## 2017-11-04 ENCOUNTER — Encounter (HOSPITAL_COMMUNITY): Payer: Medicare HMO

## 2017-11-08 ENCOUNTER — Telehealth: Payer: Self-pay | Admitting: *Deleted

## 2017-11-08 DIAGNOSIS — I509 Heart failure, unspecified: Secondary | ICD-10-CM | POA: Diagnosis not present

## 2017-11-08 DIAGNOSIS — E785 Hyperlipidemia, unspecified: Secondary | ICD-10-CM | POA: Diagnosis not present

## 2017-11-08 DIAGNOSIS — Z7982 Long term (current) use of aspirin: Secondary | ICD-10-CM | POA: Diagnosis not present

## 2017-11-08 DIAGNOSIS — I480 Paroxysmal atrial fibrillation: Secondary | ICD-10-CM | POA: Diagnosis not present

## 2017-11-08 DIAGNOSIS — R69 Illness, unspecified: Secondary | ICD-10-CM | POA: Diagnosis not present

## 2017-11-08 DIAGNOSIS — I69992 Facial weakness following unspecified cerebrovascular disease: Secondary | ICD-10-CM | POA: Diagnosis not present

## 2017-11-08 DIAGNOSIS — K219 Gastro-esophageal reflux disease without esophagitis: Secondary | ICD-10-CM | POA: Diagnosis not present

## 2017-11-08 DIAGNOSIS — I110 Hypertensive heart disease with heart failure: Secondary | ICD-10-CM | POA: Diagnosis not present

## 2017-11-08 DIAGNOSIS — I69354 Hemiplegia and hemiparesis following cerebral infarction affecting left non-dominant side: Secondary | ICD-10-CM | POA: Diagnosis not present

## 2017-11-08 DIAGNOSIS — E119 Type 2 diabetes mellitus without complications: Secondary | ICD-10-CM | POA: Diagnosis not present

## 2017-11-08 NOTE — Telephone Encounter (Signed)
[facility] [phone]~ telephone.   [facility] 1x weekly x3 weeks to continue to monitor [facility].   VO given.

## 2017-11-08 NOTE — Telephone Encounter (Signed)
noted 

## 2017-11-10 DIAGNOSIS — K219 Gastro-esophageal reflux disease without esophagitis: Secondary | ICD-10-CM | POA: Diagnosis not present

## 2017-11-10 DIAGNOSIS — I110 Hypertensive heart disease with heart failure: Secondary | ICD-10-CM | POA: Diagnosis not present

## 2017-11-10 DIAGNOSIS — E119 Type 2 diabetes mellitus without complications: Secondary | ICD-10-CM | POA: Diagnosis not present

## 2017-11-10 DIAGNOSIS — I69354 Hemiplegia and hemiparesis following cerebral infarction affecting left non-dominant side: Secondary | ICD-10-CM | POA: Diagnosis not present

## 2017-11-10 DIAGNOSIS — E785 Hyperlipidemia, unspecified: Secondary | ICD-10-CM | POA: Diagnosis not present

## 2017-11-10 DIAGNOSIS — I509 Heart failure, unspecified: Secondary | ICD-10-CM | POA: Diagnosis not present

## 2017-11-10 DIAGNOSIS — I69992 Facial weakness following unspecified cerebrovascular disease: Secondary | ICD-10-CM | POA: Diagnosis not present

## 2017-11-10 DIAGNOSIS — I480 Paroxysmal atrial fibrillation: Secondary | ICD-10-CM | POA: Diagnosis not present

## 2017-11-10 DIAGNOSIS — R69 Illness, unspecified: Secondary | ICD-10-CM | POA: Diagnosis not present

## 2017-11-10 DIAGNOSIS — Z7982 Long term (current) use of aspirin: Secondary | ICD-10-CM | POA: Diagnosis not present

## 2017-11-13 DIAGNOSIS — I509 Heart failure, unspecified: Secondary | ICD-10-CM | POA: Diagnosis not present

## 2017-11-13 DIAGNOSIS — Z7982 Long term (current) use of aspirin: Secondary | ICD-10-CM | POA: Diagnosis not present

## 2017-11-13 DIAGNOSIS — I110 Hypertensive heart disease with heart failure: Secondary | ICD-10-CM | POA: Diagnosis not present

## 2017-11-13 DIAGNOSIS — K219 Gastro-esophageal reflux disease without esophagitis: Secondary | ICD-10-CM | POA: Diagnosis not present

## 2017-11-13 DIAGNOSIS — I480 Paroxysmal atrial fibrillation: Secondary | ICD-10-CM | POA: Diagnosis not present

## 2017-11-13 DIAGNOSIS — E785 Hyperlipidemia, unspecified: Secondary | ICD-10-CM | POA: Diagnosis not present

## 2017-11-13 DIAGNOSIS — R69 Illness, unspecified: Secondary | ICD-10-CM | POA: Diagnosis not present

## 2017-11-13 DIAGNOSIS — I69992 Facial weakness following unspecified cerebrovascular disease: Secondary | ICD-10-CM | POA: Diagnosis not present

## 2017-11-13 DIAGNOSIS — I69354 Hemiplegia and hemiparesis following cerebral infarction affecting left non-dominant side: Secondary | ICD-10-CM | POA: Diagnosis not present

## 2017-11-13 DIAGNOSIS — E119 Type 2 diabetes mellitus without complications: Secondary | ICD-10-CM | POA: Diagnosis not present

## 2017-11-15 DIAGNOSIS — Z7982 Long term (current) use of aspirin: Secondary | ICD-10-CM | POA: Diagnosis not present

## 2017-11-15 DIAGNOSIS — R69 Illness, unspecified: Secondary | ICD-10-CM | POA: Diagnosis not present

## 2017-11-15 DIAGNOSIS — E785 Hyperlipidemia, unspecified: Secondary | ICD-10-CM | POA: Diagnosis not present

## 2017-11-15 DIAGNOSIS — K219 Gastro-esophageal reflux disease without esophagitis: Secondary | ICD-10-CM | POA: Diagnosis not present

## 2017-11-15 DIAGNOSIS — I509 Heart failure, unspecified: Secondary | ICD-10-CM | POA: Diagnosis not present

## 2017-11-15 DIAGNOSIS — I480 Paroxysmal atrial fibrillation: Secondary | ICD-10-CM | POA: Diagnosis not present

## 2017-11-15 DIAGNOSIS — I110 Hypertensive heart disease with heart failure: Secondary | ICD-10-CM | POA: Diagnosis not present

## 2017-11-15 DIAGNOSIS — I69992 Facial weakness following unspecified cerebrovascular disease: Secondary | ICD-10-CM | POA: Diagnosis not present

## 2017-11-15 DIAGNOSIS — E119 Type 2 diabetes mellitus without complications: Secondary | ICD-10-CM | POA: Diagnosis not present

## 2017-11-15 DIAGNOSIS — I69354 Hemiplegia and hemiparesis following cerebral infarction affecting left non-dominant side: Secondary | ICD-10-CM | POA: Diagnosis not present

## 2017-11-16 DIAGNOSIS — Z7982 Long term (current) use of aspirin: Secondary | ICD-10-CM | POA: Diagnosis not present

## 2017-11-16 DIAGNOSIS — E119 Type 2 diabetes mellitus without complications: Secondary | ICD-10-CM | POA: Diagnosis not present

## 2017-11-16 DIAGNOSIS — I69992 Facial weakness following unspecified cerebrovascular disease: Secondary | ICD-10-CM | POA: Diagnosis not present

## 2017-11-16 DIAGNOSIS — I509 Heart failure, unspecified: Secondary | ICD-10-CM | POA: Diagnosis not present

## 2017-11-16 DIAGNOSIS — R69 Illness, unspecified: Secondary | ICD-10-CM | POA: Diagnosis not present

## 2017-11-16 DIAGNOSIS — I69354 Hemiplegia and hemiparesis following cerebral infarction affecting left non-dominant side: Secondary | ICD-10-CM | POA: Diagnosis not present

## 2017-11-16 DIAGNOSIS — E785 Hyperlipidemia, unspecified: Secondary | ICD-10-CM | POA: Diagnosis not present

## 2017-11-16 DIAGNOSIS — I480 Paroxysmal atrial fibrillation: Secondary | ICD-10-CM | POA: Diagnosis not present

## 2017-11-16 DIAGNOSIS — I110 Hypertensive heart disease with heart failure: Secondary | ICD-10-CM | POA: Diagnosis not present

## 2017-11-16 DIAGNOSIS — K219 Gastro-esophageal reflux disease without esophagitis: Secondary | ICD-10-CM | POA: Diagnosis not present

## 2017-11-18 ENCOUNTER — Encounter: Payer: Self-pay | Admitting: Endocrinology

## 2017-11-18 ENCOUNTER — Ambulatory Visit (INDEPENDENT_AMBULATORY_CARE_PROVIDER_SITE_OTHER): Payer: Medicare HMO | Admitting: Endocrinology

## 2017-11-18 ENCOUNTER — Telehealth: Payer: Self-pay | Admitting: *Deleted

## 2017-11-18 VITALS — BP 112/70 | HR 86 | Wt 201.8 lb

## 2017-11-18 DIAGNOSIS — E11649 Type 2 diabetes mellitus with hypoglycemia without coma: Secondary | ICD-10-CM

## 2017-11-18 NOTE — Telephone Encounter (Signed)
Received call from patient.   Requested MD to advise if she is safe to drive.   MD please advise.

## 2017-11-18 NOTE — Progress Notes (Signed)
Subjective:    Patient ID: Tina [name], female    DOB: Sep 21, 1950, 67 y.o.   MRN: [id number]  HPI Pt returns for f/u of diabetes mellitus: DM type: Insulin-requiring type 2 Dx'ed: 2002 Complications: renal insuff, polyneuropathy, and CVA.   Therapy: insulin since 2007 GDM: never DKA: never Severe hypoglycemia: never.   Pancreatitis: never Other: She gets insulins through health dept; she takes QD insulin, after poor results with multiple daily injections.   Interval history: She has been on basaglar x just 1 month.  pt states she feels better in general, since recent CVA.  she brings a record of her cbg's which I have reviewed today.  It varies from 62-296.  It is in general lowest fasting, and the afternoon. Past Medical History:  Diagnosis Date  . Atrial fibrillation (HCC)    a. diagnosed in 05/2017 --> started on Eliquis for anticoagulation  . Carotid artery occlusion   . CHF (congestive heart failure) (HCC)   . Depression   . Diabetes mellitus   . Fibromyalgia   . GERD (gastroesophageal reflux disease)   . Hyperlipidemia   . Hypertension   . Stroke (HCC)   . Superficial thrombophlebitis    right leg    Past Surgical History:  Procedure Laterality Date  . CHOLECYSTECTOMY  1991  . HERNIA REPAIR  approx 1969  . NECK SURGERY  1998  . TONSILLECTOMY AND ADENOIDECTOMY  1966    Social History   Socioeconomic History  . Marital status: Married    Spouse name: Not on file  . Number of children: Not on file  . Years of education: Not on file  . Highest education level: Not on file  Occupational History  . Not on file  Social Needs  . Financial resource strain: Not hard at all  . Food insecurity:    Worry: Never true    Inability: Never true  . Transportation needs:    Medical: No    Non-medical: No  Tobacco Use  . Smoking status: Former Smoker    Types: Cigarettes  . Smokeless tobacco: Never Used  Substance and Sexual Activity  . Alcohol use: No  . Drug  use: No  . Sexual activity: Not Currently  Lifestyle  . Physical activity:    Days per week: Not on file    Minutes per session: Not on file  . Stress: Not on file  Relationships  . Social connections:    Talks on phone: Not on file    Gets together: Not on file    Attends religious service: Not on file    Active member of club or organization: Not on file    Attends meetings of clubs or organizations: Not on file    Relationship status: Not on file  . Intimate partner violence:    Fear of current or ex partner: Not on file    Emotionally abused: Not on file    Physically abused: Not on file    Forced sexual activity: Not on file  Other Topics Concern  . Not on file  Social History Narrative  . Not on file    Current Outpatient Medications on File Prior to Visit  Medication Sig Dispense Refill  . albuterol (PROVENTIL HFA;VENTOLIN HFA) 108 (90 Base) MCG/ACT inhaler Inhale 2 puffs into the lungs every 6 (six) hours as needed for wheezing or shortness of breath. 1 Inhaler 0  . apixaban (ELIQUIS) 5 MG TABS tablet Take 1 tablet (5 mg total)  by mouth 2 (two) times daily. 180 tablet 3  . aspirin EC 81 MG EC tablet Take 1 tablet (81 mg total) by mouth daily. 30 tablet 4  . atorvastatin (LIPITOR) 80 MG tablet Take 1 tablet (80 mg total) by mouth at bedtime. 30 tablet 4  . benzonatate (TESSALON) 100 MG capsule Take 1 capsule (100 mg total) by mouth 3 (three) times daily as needed for cough. 20 capsule 0  . clotrimazole-betamethasone (LOTRISONE) cream Apply 1 application topically 2 (two) times daily. (Patient taking differently: Apply 1 application topically daily as needed (itching). ) 60 g 1  . fenofibrate 54 MG tablet 54MG  BY MOUTH ONCE DAILY IN THE MORNING (Patient taking differently: Take 54 mg by mouth daily. ) 90 tablet 3  . furosemide (LASIX) 40 MG tablet Take 1 tablet (40 mg total) by mouth 2 (two) times daily as needed for fluid. Take 1 tablet twice a day (Patient taking  differently: Take 40 mg by mouth 2 (two) times daily as needed for fluid. )    . gabapentin (NEURONTIN) 100 MG capsule Take 1 capsule (100 mg total) by mouth at bedtime. 30 capsule 3  . Insulin Glargine (BASAGLAR KWIKPEN) 100 UNIT/ML SOPN Inject 1.2 mLs (120 Units total) into the skin every morning. Inject 120 units into skin every morning 15 pen 11  . ipratropium (ATROVENT) 0.02 % nebulizer solution Take 2.5 mLs (0.5 mg total) by nebulization every 4 (four) hours as needed for wheezing or shortness of breath. 75 mL 3  . ipratropium (ATROVENT) 0.06 % nasal spray Place 2 sprays into both nostrils 4 (four) times daily as needed for rhinitis. 15 mL 3  . montelukast (SINGULAIR) 10 MG tablet 10MG  BY MOUTH ONCE DAILY (Patient taking differently: Take 10 mg by mouth daily. ) 90 tablet 3  . nystatin (MYCOSTATIN/NYSTOP) powder Apply topically 4 (four) times daily. (Patient taking differently: Apply 1 g topically 4 (four) times daily as needed (rash). ) 180 g 2  . Omega-3 Fatty Acids (FISH OIL) 1000 MG CAPS Take 2 capsules (2,000 mg total) by mouth 2 (two) times daily. 360 capsule 3  . pantoprazole (PROTONIX) 40 MG tablet Take 1 tablet (40 mg total) by mouth daily. 90 tablet 3  . PARoxetine (PAXIL) 40 MG tablet 40MG  BY MOUTH ONCE DAILY (Patient taking differently: Take 40 mg by mouth daily. ) 90 tablet 3  . TRUEPLUS INSULIN SYRINGE 31G X 5/16" 1 ML MISC USE AS DIRECTED TO INJECT 100 each 2  . losartan (COZAAR) 50 MG tablet Take 1 tablet (50 mg total) by mouth daily. (Patient taking differently: Take 25 mg by mouth daily. ) 90 tablet 3   No current facility-administered medications on file prior to visit.     Allergies  Allergen Reactions  . Cefuroxime Axetil Other (See Comments)    REACTION: unspecified  . Fentanyl And Related Other (See Comments)    Patch: unknown reaction  . Niaspan [Niacin Er] Other (See Comments)    Burning all over  . Sulfa Antibiotics Nausea And Vomiting  . Vasotec [Enalapril]  Cough  . Welchol [Colesevelam Hcl] Other (See Comments)    Muscle aches and joint pains  . Lyrica [Pregabalin] Rash    Blistering rash around same time as starting drug  . Penicillins Hives and Rash    Has patient had a PCN reaction causing immediate rash, facial/tongue/throat swelling, SOB or lightheadedness with hypotension: Yes Has patient had a PCN reaction causing severe rash involving mucus membranes or skin  necrosis: Yes Has patient had a PCN reaction that required hospitalization: No Has patient had a PCN reaction occurring within the last 10 years: No If all of the above answers are "NO", then may proceed with Cephalosporin use.     Family History  Problem Relation Age of Onset  . Hypertension Mother   . Hyperlipidemia Mother   . Heart disease Mother   . Stroke Sister   . Hypertension Sister   . Hyperlipidemia Sister   . Heart disease Sister   . Diabetes Maternal Grandmother   . Diabetes Daughter     BP 112/70 (BP Location: Left Arm, Patient Position: Sitting, Cuff Size: Normal)   Pulse 86   Wt 201 lb 12.8 oz (91.5 kg)   SpO2 95%   BMI 40.76 kg/m    Review of Systems Denies LOC    Objective:   Physical Exam VITAL SIGNS:  See vs page GENERAL: no distress Pulses: foot pulses are intact bilaterally.   MSK: no deformity of the feet or ankles.  CV: no edema of the legs or ankles Skin:  no ulcer on the feet or ankles.  normal color and temp on the feet and ankles Neuro: sensation is intact to touch on the feet and ankles.      Lab Results  Component Value Date   HGBA1C 8.8 (H) 10/15/2017      Assessment & Plan:  Insulin-requiring type 2 DM, with CVA Renal failure: fructosamine would be more accurate than a1c hypoglycemia: next step is to avoid this.  We'll address when we have fructosamine result  Patient Instructions  check your blood sugar twice a day.  vary the time of day when you check, between before the 3 meals, and at bedtime.  also check if you  have symptoms of your blood sugar being too high or too low.  please keep a record of the readings and bring it to your next appointment here (or you can bring the meter itself).  You can write it on any piece of paper.  please call us sooner if your blood sugar goes below 70, or if you have a lot of readings over 200.  Please continue the same basalgar. A different type of diabetes blood test is requested for you today.  We'll let you know about the results.  On this type of insulin schedule, you should eat meals on a regular schedule.  If a meal is missed or significantly delayed, your blood sugar could go low.   Please come back for a follow-up appointment in 2 months.

## 2017-11-18 NOTE — Telephone Encounter (Signed)
Not until her neurologist clears her to drive from her stroke

## 2017-11-18 NOTE — Telephone Encounter (Signed)
Call placed to patient and patient made aware.   States that she has F/U with Neuro next week.

## 2017-11-18 NOTE — Patient Instructions (Addendum)
check your blood sugar twice a day.  vary the time of day when you check, between before the 3 meals, and at bedtime.  also check if you have symptoms of your blood sugar being too high or too low.  please keep a record of the readings and bring it to your next appointment here (or you can bring the meter itself).  You can write it on any piece of paper.  please call us sooner if your blood sugar goes below 70, or if you have a lot of readings over 200.  Please continue the same basalgar. A different type of diabetes blood test is requested for you today.  We'll let you know about the results.  On this type of insulin schedule, you should eat meals on a regular schedule.  If a meal is missed or significantly delayed, your blood sugar could go low.   Please come back for a follow-up appointment in 2 months.

## 2017-11-21 DIAGNOSIS — I69992 Facial weakness following unspecified cerebrovascular disease: Secondary | ICD-10-CM | POA: Diagnosis not present

## 2017-11-21 DIAGNOSIS — Z7982 Long term (current) use of aspirin: Secondary | ICD-10-CM | POA: Diagnosis not present

## 2017-11-21 DIAGNOSIS — I110 Hypertensive heart disease with heart failure: Secondary | ICD-10-CM | POA: Diagnosis not present

## 2017-11-21 DIAGNOSIS — E119 Type 2 diabetes mellitus without complications: Secondary | ICD-10-CM | POA: Diagnosis not present

## 2017-11-21 DIAGNOSIS — I480 Paroxysmal atrial fibrillation: Secondary | ICD-10-CM | POA: Diagnosis not present

## 2017-11-21 DIAGNOSIS — I509 Heart failure, unspecified: Secondary | ICD-10-CM | POA: Diagnosis not present

## 2017-11-21 DIAGNOSIS — E785 Hyperlipidemia, unspecified: Secondary | ICD-10-CM | POA: Diagnosis not present

## 2017-11-21 DIAGNOSIS — R69 Illness, unspecified: Secondary | ICD-10-CM | POA: Diagnosis not present

## 2017-11-21 DIAGNOSIS — K219 Gastro-esophageal reflux disease without esophagitis: Secondary | ICD-10-CM | POA: Diagnosis not present

## 2017-11-21 DIAGNOSIS — I69354 Hemiplegia and hemiparesis following cerebral infarction affecting left non-dominant side: Secondary | ICD-10-CM | POA: Diagnosis not present

## 2017-11-21 LAB — FRUCTOSAMINE: FRUCTOSAMINE: 339 umol/L — AB (ref 190–270)

## 2017-11-22 DIAGNOSIS — I110 Hypertensive heart disease with heart failure: Secondary | ICD-10-CM | POA: Diagnosis not present

## 2017-11-22 DIAGNOSIS — K219 Gastro-esophageal reflux disease without esophagitis: Secondary | ICD-10-CM | POA: Diagnosis not present

## 2017-11-22 DIAGNOSIS — I69992 Facial weakness following unspecified cerebrovascular disease: Secondary | ICD-10-CM | POA: Diagnosis not present

## 2017-11-22 DIAGNOSIS — E785 Hyperlipidemia, unspecified: Secondary | ICD-10-CM | POA: Diagnosis not present

## 2017-11-22 DIAGNOSIS — I509 Heart failure, unspecified: Secondary | ICD-10-CM | POA: Diagnosis not present

## 2017-11-22 DIAGNOSIS — I480 Paroxysmal atrial fibrillation: Secondary | ICD-10-CM | POA: Diagnosis not present

## 2017-11-22 DIAGNOSIS — Z7982 Long term (current) use of aspirin: Secondary | ICD-10-CM | POA: Diagnosis not present

## 2017-11-22 DIAGNOSIS — R69 Illness, unspecified: Secondary | ICD-10-CM | POA: Diagnosis not present

## 2017-11-22 DIAGNOSIS — I69354 Hemiplegia and hemiparesis following cerebral infarction affecting left non-dominant side: Secondary | ICD-10-CM | POA: Diagnosis not present

## 2017-11-22 DIAGNOSIS — E119 Type 2 diabetes mellitus without complications: Secondary | ICD-10-CM | POA: Diagnosis not present

## 2017-11-25 DIAGNOSIS — I69992 Facial weakness following unspecified cerebrovascular disease: Secondary | ICD-10-CM | POA: Diagnosis not present

## 2017-11-25 DIAGNOSIS — Z7982 Long term (current) use of aspirin: Secondary | ICD-10-CM | POA: Diagnosis not present

## 2017-11-25 DIAGNOSIS — I110 Hypertensive heart disease with heart failure: Secondary | ICD-10-CM | POA: Diagnosis not present

## 2017-11-25 DIAGNOSIS — I509 Heart failure, unspecified: Secondary | ICD-10-CM | POA: Diagnosis not present

## 2017-11-25 DIAGNOSIS — R69 Illness, unspecified: Secondary | ICD-10-CM | POA: Diagnosis not present

## 2017-11-25 DIAGNOSIS — I480 Paroxysmal atrial fibrillation: Secondary | ICD-10-CM | POA: Diagnosis not present

## 2017-11-25 DIAGNOSIS — I69354 Hemiplegia and hemiparesis following cerebral infarction affecting left non-dominant side: Secondary | ICD-10-CM | POA: Diagnosis not present

## 2017-11-25 DIAGNOSIS — E119 Type 2 diabetes mellitus without complications: Secondary | ICD-10-CM | POA: Diagnosis not present

## 2017-11-25 DIAGNOSIS — E785 Hyperlipidemia, unspecified: Secondary | ICD-10-CM | POA: Diagnosis not present

## 2017-11-25 DIAGNOSIS — K219 Gastro-esophageal reflux disease without esophagitis: Secondary | ICD-10-CM | POA: Diagnosis not present

## 2017-11-30 ENCOUNTER — Encounter: Payer: Self-pay | Admitting: Adult Health

## 2017-11-30 ENCOUNTER — Telehealth: Payer: Self-pay | Admitting: Family Medicine

## 2017-11-30 ENCOUNTER — Ambulatory Visit: Payer: Medicare HMO | Admitting: Adult Health

## 2017-11-30 VITALS — BP 158/101 | HR 57 | Ht 59.0 in | Wt 207.2 lb

## 2017-11-30 DIAGNOSIS — I10 Essential (primary) hypertension: Secondary | ICD-10-CM | POA: Diagnosis not present

## 2017-11-30 DIAGNOSIS — Z794 Long term (current) use of insulin: Secondary | ICD-10-CM

## 2017-11-30 DIAGNOSIS — I6381 Other cerebral infarction due to occlusion or stenosis of small artery: Secondary | ICD-10-CM

## 2017-11-30 DIAGNOSIS — E785 Hyperlipidemia, unspecified: Secondary | ICD-10-CM

## 2017-11-30 DIAGNOSIS — M797 Fibromyalgia: Secondary | ICD-10-CM | POA: Diagnosis not present

## 2017-11-30 DIAGNOSIS — I639 Cerebral infarction, unspecified: Secondary | ICD-10-CM | POA: Diagnosis not present

## 2017-11-30 DIAGNOSIS — E119 Type 2 diabetes mellitus without complications: Secondary | ICD-10-CM | POA: Diagnosis not present

## 2017-11-30 MED ORDER — HYDROCODONE-ACETAMINOPHEN 5-325 MG PO TABS: 1.0000 | ORAL_TABLET | Freq: Two times a day (BID) | ORAL | 0 refills | Status: DC | PRN

## 2017-11-30 NOTE — Telephone Encounter (Signed)
Patient called and lm saying that she was in to see her neuro doctor, and that doctor said it was ok for her to take her pain medication, she would like to speak to you regarding this [phone]

## 2017-11-30 NOTE — Patient Instructions (Addendum)
Continue aspirin 81 mg daily and Eliquis (apixaban) daily  and lipitor  for secondary stroke prevention  Continue to follow up with PCP regarding cholesterol and blood pressure management   Follow up with PCP regarding fibromyalgia and pain management   Continue to follow with endocrinologist regarding diabetes  Continue to follow with cardiologist regarding atrial fibrillation and Eliquis   Continue to monitor blood pressure at home  Maintain strict control of hypertension with blood pressure goal below 130/90, diabetes with hemoglobin A1c goal below 6.5% and cholesterol with LDL cholesterol (bad cholesterol) goal below 70 mg/dL. I also advised the patient to eat a healthy diet with plenty of whole grains, cereals, fruits and vegetables, exercise regularly and maintain ideal body weight.  Followup in the future with me in 3 months or call earlier if needed         [facility] you [facility]. I hope we have been able to provide you high quality care today.  You may receive a patient satisfaction survey over the next few weeks. We would appreciate your feedback and comments so that we may continue to improve ourselves and the health of our patients.

## 2017-11-30 NOTE — Telephone Encounter (Signed)
As this medicine can cause more dizziness/ sedation   I am going to reduce dose to Norco 5-325mg  and only 30 tablets, must last entire month, do not take more than 2 in a day

## 2017-11-30 NOTE — Progress Notes (Signed)
[facility] [address] [place]. [place] [id number] [phone]       OFFICE FOLLOW UP NOTE  [name] Mahaffy Date of Birth:  [birth year] Medical Record Number:  [id number]   Reason for Referral:  hospital stroke follow up  CHIEF COMPLAINT:  Chief Complaint  Patient presents with  . 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 History obtained from patient and chart review. Reviewed all radiology images and labs personally.  [name] Hladik is a 67 y.o. female with history of atrial fibrillation on Eliquis, congestive heart failure, depression, diabetes mellitus, hyperlipidemia, hypertension, previous strokes, carotid artery disease, and history of a right vertebral artery dissection with occlusion who presented with transient left-sided weakness/numbness. She did not receive IV t-PA due to resolution of deficits.  CT head reviewed and showed chronic bilateral basal ganglia and right cerebellar infarcts.  MRI head reviewed showed punctate acute nonhemorrhagic infarcts involving the lateral right thalamus and remote lacunar infarcts.  CTA head and neck showed chronic occlusion in the right vertebral artery which was related to remote right vertebral dissection.  2D echo negative for PFO or thrombus.  Carotid Doppler showed right stenosis of 40 to 59% and left stenosis of 1 to 39%.  LDL 87 and recommended starting Lipitor 80 mg daily.  A1c elevated at 9.8 and recommended tight glycemic control with close PCP follow-up.  Previously on Eliquis PTA and recommended continuation but with additional aspirin 81 mg daily.  Per Dr. Roda ShuttersXu during hospital admission, stenosis was not severe enough for CEA along with etiology was not embolic but related to small vessel disease.  Patient discharged in  stable condition.  Patient is being seen today for hospital follow-up.  She states she continues to experience numbness in her fingers bilaterally which states has been new since her stroke.  Also states that she does continue to have unsteadiness in her gait but this is been improving.  She states her left side continues to feel "weird"  along with pain located in her left shoulder.  Attempted to assess whether this "weird" sensation along with pain was coming from a weakness or numbness/tingling but patient unable to exactly state and ended up becoming frustrated.  Patient states she just has an increased amount of pain but also stated that she was not 1 to abuse pain medication but that she was not having this pain prior to having hydrocodone-acetaminophen stopped.  Patient was previously treated with hydrocodone-acetaminophen for fibromyalgia per patient by her PCP.  Hydrocodone-acetaminophen was stopped during hospitalization and was not continued by PCP due to balance issues and safety concerns in regards to increase in falls.  Patient stated that her PCP wanted clearance from his neurology office prior to restarting her on her hydrocodone-acetaminophen. She has recently completed home PT/OT.  She has been driving and return to all previous activities without complications.  Continues to take Eliquis and aspirin without side effects of bleeding or bruising.  Continues to take Lipitor without side effects myalgias.  Blood pressure elevated today at 158/101 but patient does take this at home and typically SBP 1 20-1 30.  Denies new or worsening stroke/TIA symptoms.   ROS:   14 system review of systems performed and negative with exception of fatigue, shortness of  breath, incontinence, joint pain, aching muscles, allergies, weakness, sleepiness and restless leg  PMH:  Past Medical History:  Diagnosis Date  . Atrial fibrillation (HCC)    a. diagnosed in 05/2017 --> started on Eliquis for anticoagulation   . Carotid artery occlusion   . CHF (congestive heart failure) (HCC)   . Depression   . Diabetes mellitus   . Fibromyalgia   . GERD (gastroesophageal reflux disease)   . Hyperlipidemia   . Hypertension   . Stroke (HCC)   . Superficial thrombophlebitis    right leg    PSH:  Past Surgical History:  Procedure Laterality Date  . CHOLECYSTECTOMY  1991  . HERNIA REPAIR  approx 1969  . NECK SURGERY  1998  . TONSILLECTOMY AND ADENOIDECTOMY  1966    Social History:  Social History   Socioeconomic History  . Marital status: Married    Spouse name: Not on file  . Number of children: Not on file  . Years of education: Not on file  . Highest education level: Not on file  Occupational History  . Not on file  Social Needs  . Financial resource strain: Not hard at all  . Food insecurity:    Worry: Never true    Inability: Never true  . Transportation needs:    Medical: No    Non-medical: No  Tobacco Use  . Smoking status: Former Smoker    Types: Cigarettes  . Smokeless tobacco: Never Used  Substance and Sexual Activity  . Alcohol use: No  . Drug use: No  . Sexual activity: Not Currently  Lifestyle  . Physical activity:    Days per week: Not on file    Minutes per session: Not on file  . Stress: Not on file  Relationships  . Social connections:    Talks on phone: Not on file    Gets together: Not on file    Attends religious service: Not on file    Active member of club or organization: Not on file    Attends meetings of clubs or organizations: Not on file    Relationship status: Not on file  . Intimate partner violence:    Fear of current or ex partner: Not on file    Emotionally abused: Not on file    Physically abused: Not on file    Forced sexual activity: Not on file  Other Topics Concern  . Not on file  Social History Narrative  . Not on file    Family History:  Family History  Problem Relation Age of Onset  . Hypertension Mother   . Hyperlipidemia  Mother   . Heart disease Mother   . Stroke Sister   . Hypertension Sister   . Hyperlipidemia Sister   . Heart disease Sister   . Diabetes Maternal Grandmother   . Diabetes Daughter     Medications:   Current Outpatient Medications on File Prior to Visit  Medication Sig Dispense Refill  . albuterol (PROVENTIL HFA;VENTOLIN HFA) 108 (90 Base) MCG/ACT inhaler Inhale 2 puffs into the lungs every 6 (six) hours as needed for wheezing or shortness of breath. 1 Inhaler 0  . apixaban (ELIQUIS) 5 MG TABS tablet Take 1 tablet (5 mg total) by mouth 2 (two) times daily. 180 tablet 3  . aspirin EC 81 MG EC tablet Take 1 tablet (81 mg total) by mouth daily. 30 tablet 4  . atorvastatin (LIPITOR) 80 MG tablet Take 1 tablet (80 mg total) by mouth at  bedtime. 30 tablet 4  . benzonatate (TESSALON) 100 MG capsule Take 1 capsule (100 mg total) by mouth 3 (three) times daily as needed for cough. 20 capsule 0  . clotrimazole-betamethasone (LOTRISONE) cream Apply 1 application topically 2 (two) times daily. (Patient taking differently: Apply 1 application topically daily as needed (itching). ) 60 g 1  . fenofibrate 54 MG tablet 54MG  BY MOUTH ONCE DAILY IN THE MORNING (Patient taking differently: Take 54 mg by mouth daily. ) 90 tablet 3  . furosemide (LASIX) 40 MG tablet Take 1 tablet (40 mg total) by mouth 2 (two) times daily as needed for fluid. Take 1 tablet twice a day (Patient taking differently: Take 40 mg by mouth 2 (two) times daily as needed for fluid. )    . gabapentin (NEURONTIN) 100 MG capsule Take 1 capsule (100 mg total) by mouth at bedtime. 30 capsule 3  . Insulin Glargine (BASAGLAR KWIKPEN) 100 UNIT/ML SOPN Inject 1.2 mLs (120 Units total) into the skin every morning. Inject 120 units into skin every morning 15 pen 11  . ipratropium (ATROVENT) 0.02 % nebulizer solution Take 2.5 mLs (0.5 mg total) by nebulization every 4 (four) hours as needed for wheezing or shortness of breath. 75 mL 3  . ipratropium  (ATROVENT) 0.06 % nasal spray Place 2 sprays into both nostrils 4 (four) times daily as needed for rhinitis. 15 mL 3  . losartan (COZAAR) 50 MG tablet Take 1 tablet (50 mg total) by mouth daily. (Patient taking differently: Take 25 mg by mouth daily. ) 90 tablet 3  . montelukast (SINGULAIR) 10 MG tablet 10MG  BY MOUTH ONCE DAILY (Patient taking differently: Take 10 mg by mouth daily. ) 90 tablet 3  . nystatin (MYCOSTATIN/NYSTOP) powder Apply topically 4 (four) times daily. (Patient taking differently: Apply 1 g topically 4 (four) times daily as needed (rash). ) 180 g 2  . Omega-3 Fatty Acids (FISH OIL) 1000 MG CAPS Take 2 capsules (2,000 mg total) by mouth 2 (two) times daily. 360 capsule 3  . pantoprazole (PROTONIX) 40 MG tablet Take 1 tablet (40 mg total) by mouth daily. 90 tablet 3  . PARoxetine (PAXIL) 40 MG tablet 40MG  BY MOUTH ONCE DAILY (Patient taking differently: Take 40 mg by mouth daily. ) 90 tablet 3  . TRUEPLUS INSULIN SYRINGE 31G X 5/16" 1 ML MISC USE AS DIRECTED TO INJECT 100 each 2   No current facility-administered medications on file prior to visit.     Allergies:   Allergies  Allergen Reactions  . Cefuroxime Axetil Other (See Comments)    REACTION: unspecified  . Fentanyl And Related Other (See Comments)    Patch: unknown reaction  . Niaspan [Niacin Er] Other (See Comments)    Burning all over  . Sulfa Antibiotics Nausea And Vomiting  . Vasotec [Enalapril] Cough  . Welchol [Colesevelam Hcl] Other (See Comments)    Muscle aches and joint pains  . Lyrica [Pregabalin] Rash    Blistering rash around same time as starting drug  . Penicillins Hives and Rash    Has patient had a PCN reaction causing immediate rash, facial/tongue/throat swelling, SOB or lightheadedness with hypotension: Yes Has patient had a PCN reaction causing severe rash involving mucus membranes or skin necrosis: Yes Has patient had a PCN reaction that required hospitalization: No Has patient had a PCN  reaction occurring within the last 10 years: No If all of the above answers are "NO", then may proceed with Cephalosporin use.  Physical Exam  Vitals:   11/30/17 1243  BP: (!) 158/101  Pulse: (!) 57  Weight: 207 lb 3.2 oz (94 kg)  Height: 4\' 11"  (1.499 m)   Body mass index is 41.85 kg/m. No exam data present  General: well developed, well nourished, Caucasian female, seated, in no evident distress Head: head normocephalic and atraumatic.   Neck: supple with no carotid or supraclavicular bruits Cardiovascular: regular rate and rhythm, no murmurs Musculoskeletal: no deformity Skin:  no rash/petichiae Vascular:  Normal pulses all extremities  Neurologic Exam Mental Status: Awake and fully alert. Oriented to place and time. Recent and remote memory intact. Attention span, concentration and fund of knowledge appropriate. Mood and affect appropriate.  Cranial Nerves: Fundoscopic exam reveals sharp disc margins. Pupils equal, briskly reactive to light. Extraocular movements full without nystagmus. Visual fields full to confrontation. Hearing intact. Facial sensation intact. Face, tongue, palate moves normally and symmetrically.  Motor: Normal bulk and tone. Normal strength in all tested extremity muscles. Sensory.: intact to touch , pinprick , position and vibratory sensation.  Coordination: Rapid alternating movements normal in all extremities. Finger-to-nose and heel-to-shin performed accurately bilaterally.  Mild orbiting right over left arm. Gait and Station: Arises from chair without difficulty. Stance is normal. Gait demonstrates normal stride length and balance.  Able to heel, toe without difficulty.  Mild difficulty with tandem gait.  Romberg negative. Reflexes: 1+ and symmetric. Toes downgoing.    NIHSS  0 Modified Rankin  1 HAS-BLED 2 CHA2DS2-VASc 8   Diagnostic Data (Labs, Imaging, Testing)  Ct Angio Head W Or Wo Contrast Ct Angio Neck W And/or Wo  Contrast 10/14/2017 IMPRESSION:  Nonstenotic atheromatous change both carotid bifurcations. 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 No significant intracranial stenosis, dissection, or saccular aneurysm. No abnormal postcontrast enhancement.   Dg Chest 2 View 10/14/2017 IMPRESSION:  No active cardiopulmonary disease.   Ct Head Wo Contrast 10/14/2017 IMPRESSION:  1. 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  2. No acute intracranial abnormality.   Ct Angio Neck W And/or Wo Contrast 10/14/2017 IMPRESSION:  Nonstenotic atheromatous change both carotid bifurcations. 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 No significant intracranial stenosis, dissection, or saccular aneurysm. No abnormal postcontrast enhancement.   MRI brain wo contrast 10/16/2017 -Punctate acute nonhemorrhagic infarct involving the lateral right thalamus    ASSESSMENT: Tina [name] is a 67 y.o. year old female here with lateral right thalamus on 10/14/17 secondary to small vessel disease. Vascular risk factors include PAF, HTN, HLD and DM.    PLAN: -Continue aspirin 81 mg daily and Eliquis (apixaban) daily  and lipitor  for secondary stroke prevention -F/u with PCP regarding your HLD, HTN and DM management -Advised patient to follow back up with PCP in regards to fibromyalgia treatment with hydrocodone-acetaminophen.  Patient did have a previous follow-up appointment with PCP on 10/28/2017 and it seems as though patient has improved greatly but does continue to have mild unsteadiness. -Continue to follow-up with cardiologist regarding atrial fibrillation and Eliquis management -continue to monitor BP at home -Maintain strict control of hypertension with blood pressure goal below 130/90, diabetes with hemoglobin A1c goal below  6.5% and cholesterol with LDL cholesterol (bad cholesterol) goal below 70 mg/dL. I also advised the patient to eat a healthy diet with plenty of whole grains, cereals, fruits and vegetables, exercise regularly and maintain ideal body weight.  Follow up in 3 months or call earlier if needed   Greater than 50% of time during this 25 minute visit was spent on counseling,explanation of diagnosis of lateral right thalamic infarct, reviewing risk factor management of PAF, HTN, HLD and DM, planning of further management, discussion with patient and family and coordination of care   George Hugh, [facility]  [facility] [address] [place], [place] [address]  Phone [phone] Fax [phone]

## 2017-11-30 NOTE — Telephone Encounter (Signed)
Call placed to patient.  Patient reports that she was advised to discuss pain management with PCP for fibromyalgia.   Patient is requesting PCP refill Hydrocodone/ APAP.  MD please advise.

## 2017-12-01 DIAGNOSIS — I509 Heart failure, unspecified: Secondary | ICD-10-CM | POA: Diagnosis not present

## 2017-12-01 DIAGNOSIS — I480 Paroxysmal atrial fibrillation: Secondary | ICD-10-CM | POA: Diagnosis not present

## 2017-12-01 DIAGNOSIS — R69 Illness, unspecified: Secondary | ICD-10-CM | POA: Diagnosis not present

## 2017-12-01 DIAGNOSIS — I69354 Hemiplegia and hemiparesis following cerebral infarction affecting left non-dominant side: Secondary | ICD-10-CM | POA: Diagnosis not present

## 2017-12-01 DIAGNOSIS — Z7982 Long term (current) use of aspirin: Secondary | ICD-10-CM | POA: Diagnosis not present

## 2017-12-01 DIAGNOSIS — I110 Hypertensive heart disease with heart failure: Secondary | ICD-10-CM | POA: Diagnosis not present

## 2017-12-01 DIAGNOSIS — E119 Type 2 diabetes mellitus without complications: Secondary | ICD-10-CM | POA: Diagnosis not present

## 2017-12-01 DIAGNOSIS — I69992 Facial weakness following unspecified cerebrovascular disease: Secondary | ICD-10-CM | POA: Diagnosis not present

## 2017-12-01 DIAGNOSIS — K219 Gastro-esophageal reflux disease without esophagitis: Secondary | ICD-10-CM | POA: Diagnosis not present

## 2017-12-01 DIAGNOSIS — E785 Hyperlipidemia, unspecified: Secondary | ICD-10-CM | POA: Diagnosis not present

## 2017-12-01 NOTE — Progress Notes (Signed)
I agree with the above plan 

## 2017-12-01 NOTE — Telephone Encounter (Signed)
Call placed to patient and patient made aware.  

## 2017-12-01 NOTE — Telephone Encounter (Signed)
Call placed to patient. LMTRC.  

## 2017-12-19 ENCOUNTER — Encounter: Payer: Self-pay | Admitting: Family Medicine

## 2017-12-19 ENCOUNTER — Other Ambulatory Visit: Payer: Self-pay

## 2017-12-19 ENCOUNTER — Ambulatory Visit (INDEPENDENT_AMBULATORY_CARE_PROVIDER_SITE_OTHER): Payer: Medicare HMO | Admitting: Family Medicine

## 2017-12-19 VITALS — BP 138/78 | HR 112 | Temp 98.3°F | Resp 16 | Ht 59.0 in | Wt 202.0 lb

## 2017-12-19 DIAGNOSIS — B958 Unspecified staphylococcus as the cause of diseases classified elsewhere: Secondary | ICD-10-CM

## 2017-12-19 DIAGNOSIS — M797 Fibromyalgia: Secondary | ICD-10-CM

## 2017-12-19 DIAGNOSIS — I10 Essential (primary) hypertension: Secondary | ICD-10-CM

## 2017-12-19 DIAGNOSIS — G459 Transient cerebral ischemic attack, unspecified: Secondary | ICD-10-CM | POA: Diagnosis not present

## 2017-12-19 DIAGNOSIS — E11649 Type 2 diabetes mellitus with hypoglycemia without coma: Secondary | ICD-10-CM

## 2017-12-19 DIAGNOSIS — L089 Local infection of the skin and subcutaneous tissue, unspecified: Secondary | ICD-10-CM | POA: Diagnosis not present

## 2017-12-19 DIAGNOSIS — I6930 Unspecified sequelae of cerebral infarction: Secondary | ICD-10-CM | POA: Diagnosis not present

## 2017-12-19 MED ORDER — FENOFIBRATE 54 MG PO TABS: 54.0000 mg | ORAL_TABLET | Freq: Every day | ORAL | 1 refills | Status: DC

## 2017-12-19 MED ORDER — ATORVASTATIN CALCIUM 80 MG PO TABS: 80.0000 mg | ORAL_TABLET | Freq: Every day | ORAL | 1 refills | Status: DC

## 2017-12-19 MED ORDER — APIXABAN 5 MG PO TABS: 5.0000 mg | ORAL_TABLET | Freq: Two times a day (BID) | ORAL | 3 refills | Status: DC

## 2017-12-19 NOTE — Assessment & Plan Note (Signed)
He is following with endocrinology.  Will obtain urine microalbumin.  She is going to follow-up with her eye doctor when she goes to visit her sister.  Overall she has been more compliant with her medications and appointments

## 2017-12-19 NOTE — Assessment & Plan Note (Signed)
Follow-up with neurology.  Her Eliquis was refilled reviewed her last note

## 2017-12-19 NOTE — Assessment & Plan Note (Signed)
Blood pressure improved today on 25 mg of losartan no changes

## 2017-12-19 NOTE — Patient Instructions (Addendum)
Continue pain medication Referral to Dr. Margo AyeHall- [place] F/U 5 months

## 2017-12-19 NOTE — Assessment & Plan Note (Signed)
She is back on low-dose of gabapentin 100 mg at bedtime and taking hydrocodone which she had a bottle with her today still has over half a bottle this was refilled this month so she is not overusing.

## 2017-12-19 NOTE — Progress Notes (Signed)
Subjective:    Patient ID: Tina [name], female    DOB: Sep 16, 1950, 67 y.o.   MRN: [id number]  Patient presents for Follow-up (is not fasting)  Pt here for F/U on chronic medical problems      DM- following with endocrinologist, now on Basaglar 110units in the morning- by Dr. Everardo AllEllison, states her sugar    HTN- BP was elevated at neurology office , she has been taking losartan 25mg  , she is taking lasix twice a day   Recent Stroke-  States she has pain all over, has fibromyalgia as well. Taking the gabapentin at bedtime   She ha snot had any pain past 2 days  Recently moved into her own apartment  She has completed Physical and occupational therapy She joined [employer] in [place]  [place] to go [facility] [place] [place] where her sister lives   Wants to get set up to see Dermatology     Review Of Systems:  GEN- denies fatigue, fever, weight loss,weakness, recent illness HEENT- denies eye drainage, change in vision, nasal discharge, CVS- denies chest pain, palpitations RESP- denies SOB, cough, wheeze ABD- denies N/V, change in stools, abd pain GU- denies dysuria, hematuria, dribbling, incontinence MSK- + joint pain,+ muscle aches, injury Neuro- denies headache, dizziness, syncope, seizure activity       Objective:    BP 138/78   Pulse (!) 112   Temp 98.3 F (36.8 C) (Oral)   Resp 16   Ht 4\' 11"  (1.499 m)   Wt 202 lb (91.6 kg)   SpO2 94%   BMI 40.80 kg/m  GEN- NAD, alert and oriented x3 HEENT- PERRL, EOMI, non injected sclera, pink conjunctiva, MMM, oropharynx clear Neck- Supple, no thyromegaly CVS- mild tachycardia, normal rate, no murmur RESP-CTAB ABD-NABS,soft,NT,ND Skin-s cabs on upper back, bilat forearms, no pustular lesions  EXT- No edema Pulses- Radial, 2+        Assessment & Plan:      Problem List Items Addressed This Visit      Unprioritized   Diabetes mellitus type II, uncontrolled (HCC)    He is following with endocrinology.  Will obtain  urine microalbumin.  She is going to follow-up with her eye doctor when she goes to visit her sister.  Overall she has been more compliant with her medications and appointments      Relevant Medications   atorvastatin (LIPITOR) 80 MG tablet   Other Relevant Orders   Microalbumin / creatinine urine ratio   Essential hypertension - Primary    Blood pressure improved today on 25 mg of losartan no changes      Relevant Medications   apixaban (ELIQUIS) 5 MG TABS tablet   fenofibrate 54 MG tablet   atorvastatin (LIPITOR) 80 MG tablet   Fibromyalgia    She is back on low-dose of gabapentin 100 mg at bedtime and taking hydrocodone which she had a bottle with her today still has over half a bottle this was refilled this month so she is not overusing.      History of cerebrovascular accident (CVA) with residual deficit    Follow-up with neurology.  Her Eliquis was refilled reviewed her last note       Other Visit Diagnoses    TIA (transient ischemic attack)       Relevant Medications   apixaban (ELIQUIS) 5 MG TABS tablet   fenofibrate 54 MG tablet   atorvastatin (LIPITOR) 80 MG tablet   Staphylococcal infection of skin  recurrent infections which she also picks at. Missed last dermatology appt. We will reschedule no active infections just scabs/scarring today   Relevant Orders   Ambulatory referral to Dermatology      Note: This dictation was prepared with Dragon dictation along with smaller phrase technology. Any transcriptional errors that result from this process are unintentional.

## 2017-12-20 LAB — MICROALBUMIN / CREATININE URINE RATIO: CREATININE, URINE: 20 mg/dL (ref 20–275)

## 2017-12-21 ENCOUNTER — Telehealth: Payer: Self-pay | Admitting: Family Medicine

## 2017-12-21 ENCOUNTER — Encounter: Payer: Self-pay | Admitting: *Deleted

## 2017-12-21 NOTE — Telephone Encounter (Signed)
Noted,pt on paxil, no benzo

## 2017-12-21 NOTE — Telephone Encounter (Signed)
I received an anonymous phone call from a gentleman with this number, he stated patient was giving her anxiety medication to his wife

## 2017-12-23 ENCOUNTER — Telehealth: Payer: Self-pay

## 2017-12-23 NOTE — Telephone Encounter (Signed)
Pt needs to pay $240 of deductible remaining from insurance for Eliquis, pt states she can't pay. She has used the free trial card already.[facility] is $113.51, pt states she has to wait for check which comes 3 rd Wednesday   Patient states she has a friend who is picking up samples from Dr.Napoleon, and she is going to [facility] the [facility]

## 2017-12-29 ENCOUNTER — Encounter: Payer: Self-pay | Admitting: Family Medicine

## 2018-01-19 ENCOUNTER — Ambulatory Visit: Payer: Medicare HMO | Admitting: Endocrinology

## 2018-01-19 DIAGNOSIS — L728 Other follicular cysts of the skin and subcutaneous tissue: Secondary | ICD-10-CM | POA: Diagnosis not present

## 2018-01-19 DIAGNOSIS — L720 Epidermal cyst: Secondary | ICD-10-CM | POA: Diagnosis not present

## 2018-01-25 DIAGNOSIS — R69 Illness, unspecified: Secondary | ICD-10-CM | POA: Diagnosis not present

## 2018-01-30 ENCOUNTER — Telehealth: Payer: Self-pay | Admitting: Cardiovascular Disease

## 2018-01-30 NOTE — Telephone Encounter (Signed)
Fwd to provider for further suggestions.

## 2018-01-30 NOTE — Telephone Encounter (Signed)
I would have her speak with her PCP re: fibromyalgia and treatment. Crestor was switched to Lipitor while hospitalized for CVA, not by me.

## 2018-01-30 NOTE — Telephone Encounter (Signed)
Patient feels she can not take the Lipitor due to the pain from Fibromyalgia   She would like to know if she can take something else

## 2018-01-31 NOTE — Telephone Encounter (Signed)
Left message to return call 

## 2018-01-31 NOTE — Telephone Encounter (Signed)
Patient notified.  Stated that her pmd is out on maternity leave & preferred not to see her PA.  Nurse suggested that she at least call & leave message with the nurse, PA may be able to answer message through a phone call as opposed to having office visit.  She stated that she would just stop taking it for now.  Patient already has follow up scheduled for December & suggested that she discuss in greater detail with provider at that time.  She verbalized understanding.

## 2018-02-01 ENCOUNTER — Ambulatory Visit: Payer: Medicare HMO | Admitting: Physician Assistant

## 2018-02-23 ENCOUNTER — Telehealth: Payer: Self-pay | Admitting: Endocrinology

## 2018-02-23 NOTE — Telephone Encounter (Signed)
[facility] Department is putting patient on a program for patients Basaglar & Lantis (patient unable to pay for medication). It will take 10-14 days for program to kick in. In the meantime, [facility] Dept can supply 2 vials of Lantis to the patient free of charge but will need Dr. Everardo All to sign the RX that was faxed to Dr. Everardo All today.  They close at 5pm today and are closed on Fridays. Patient has been without medication for 3 days. If questions please call ph# [phone], fax# [phone]

## 2018-02-23 NOTE — Telephone Encounter (Signed)
Pt canceled her appt here.  I have changed prescription to 1 vial only.  Needs ov next available.

## 2018-02-24 NOTE — Telephone Encounter (Signed)
Please schedule pt per phone note

## 2018-02-24 NOTE — Telephone Encounter (Signed)
Spoke to patient today and got her scheduled to come in for visit on 02/27/18

## 2018-02-24 NOTE — Telephone Encounter (Signed)
Patient called but no answer LMTCB to schedule

## 2018-02-24 NOTE — Telephone Encounter (Signed)
Please see phone note pt needs appt

## 2018-02-27 ENCOUNTER — Ambulatory Visit (INDEPENDENT_AMBULATORY_CARE_PROVIDER_SITE_OTHER): Payer: Medicare HMO | Admitting: Endocrinology

## 2018-02-27 ENCOUNTER — Encounter: Payer: Self-pay | Admitting: Endocrinology

## 2018-02-27 VITALS — BP 132/80 | HR 90 | Ht 59.0 in | Wt 205.4 lb

## 2018-02-27 DIAGNOSIS — Z23 Encounter for immunization: Secondary | ICD-10-CM

## 2018-02-27 DIAGNOSIS — E11649 Type 2 diabetes mellitus with hypoglycemia without coma: Secondary | ICD-10-CM

## 2018-02-27 LAB — POCT GLYCOSYLATED HEMOGLOBIN (HGB A1C): HEMOGLOBIN A1C: 8.5 % — AB (ref 4.0–5.6)

## 2018-02-27 MED ORDER — INSULIN GLARGINE 100 UNIT/ML ~~LOC~~ SOLN: 110.0000 [IU] | SUBCUTANEOUS | 3 refills | Status: DC

## 2018-02-27 NOTE — Progress Notes (Signed)
Subjective:    Patient ID: Tina [name], female    DOB: Aug 21, 1950, 67 y.o.   MRN: [id number]  HPI Pt returns for f/u of diabetes mellitus: DM type: Insulin-requiring type 2 Dx'ed: 2002 Complications: renal insuff, polyneuropathy, and CVA.   Therapy: insulin since 2007 GDM: never DKA: never Severe hypoglycemia: never.   Pancreatitis: never Other: She gets insulins through health dept; she takes QD insulin, after poor results with multiple daily injections; fructosamine has shown approx 1% A1c pts better than a1c itself  Interval history: she brings a record of her cbg's which I have reviewed today.  It varies from 67-296.  It is in general lowest fasting, and the afternoon.  She takes basaglar 110 units qam.  However, she was missing it, due to cost.  She went to health dept, who was able to provide Lantus for her.   Past Medical History:  Diagnosis Date  . Atrial fibrillation (HCC)    a. diagnosed in 05/2017 --> started on Eliquis for anticoagulation  . Carotid artery occlusion   . CHF (congestive heart failure) (HCC)   . Depression   . Diabetes mellitus   . Fibromyalgia   . GERD (gastroesophageal reflux disease)   . Hyperlipidemia   . Hypertension   . Stroke (HCC)   . Superficial thrombophlebitis    right leg    Past Surgical History:  Procedure Laterality Date  . CHOLECYSTECTOMY  1991  . HERNIA REPAIR  approx 1969  . NECK SURGERY  1998  . TONSILLECTOMY AND ADENOIDECTOMY  1966    Social History   Socioeconomic History  . Marital status: Married    Spouse name: Not on file  . Number of children: Not on file  . Years of education: Not on file  . Highest education level: Not on file  Occupational History  . Not on file  Social Needs  . Financial resource strain: Not hard at all  . Food insecurity:    Worry: Never true    Inability: Never true  . Transportation needs:    Medical: No    Non-medical: No  Tobacco Use  . Smoking status: Former Smoker   Types: Cigarettes  . Smokeless tobacco: Never Used  Substance and Sexual Activity  . Alcohol use: No  . Drug use: No  . Sexual activity: Not Currently  Lifestyle  . Physical activity:    Days per week: Not on file    Minutes per session: Not on file  . Stress: Not on file  Relationships  . Social connections:    Talks on phone: Not on file    Gets together: Not on file    Attends religious service: Not on file    Active member of club or organization: Not on file    Attends meetings of clubs or organizations: Not on file    Relationship status: Not on file  . Intimate partner violence:    Fear of current or ex partner: Not on file    Emotionally abused: Not on file    Physically abused: Not on file    Forced sexual activity: Not on file  Other Topics Concern  . Not on file  Social History Narrative  . Not on file    Current Outpatient Medications on File Prior to Visit  Medication Sig Dispense Refill  . albuterol (PROVENTIL HFA;VENTOLIN HFA) 108 (90 Base) MCG/ACT inhaler Inhale 2 puffs into the lungs every 6 (six) hours as needed for wheezing or shortness  of breath. 1 Inhaler 0  . apixaban (ELIQUIS) 5 MG TABS tablet Take 1 tablet (5 mg total) by mouth 2 (two) times daily. 180 tablet 3  . aspirin EC 81 MG EC tablet Take 1 tablet (81 mg total) by mouth daily. 30 tablet 4  . benzonatate (TESSALON) 100 MG capsule Take 1 capsule (100 mg total) by mouth 3 (three) times daily as needed for cough. 20 capsule 0  . clotrimazole-betamethasone (LOTRISONE) cream Apply 1 application topically 2 (two) times daily. (Patient taking differently: Apply 1 application topically daily as needed (itching). ) 60 g 1  . fenofibrate 54 MG tablet Take 1 tablet (54 mg total) by mouth daily. 90 tablet 1  . furosemide (LASIX) 40 MG tablet Take 1 tablet (40 mg total) by mouth 2 (two) times daily as needed for fluid. Take 1 tablet twice a day (Patient taking differently: Take 40 mg by mouth 2 (two) times daily  as needed for fluid. )    . gabapentin (NEURONTIN) 100 MG capsule Take 1 capsule (100 mg total) by mouth at bedtime. 30 capsule 3  . HYDROcodone-acetaminophen (NORCO) 5-325 MG tablet Take 1 tablet by mouth 2 (two) times daily as needed for moderate pain. 30 tablet 0  . ipratropium (ATROVENT) 0.02 % nebulizer solution Take 2.5 mLs (0.5 mg total) by nebulization every 4 (four) hours as needed for wheezing or shortness of breath. 75 mL 3  . ipratropium (ATROVENT) 0.06 % nasal spray Place 2 sprays into both nostrils 4 (four) times daily as needed for rhinitis. 15 mL 3  . montelukast (SINGULAIR) 10 MG tablet 10MG  BY MOUTH ONCE DAILY (Patient taking differently: Take 10 mg by mouth daily. ) 90 tablet 3  . nystatin (MYCOSTATIN/NYSTOP) powder Apply topically 4 (four) times daily. (Patient taking differently: Apply 1 g topically 4 (four) times daily as needed (rash). ) 180 g 2  . Omega-3 Fatty Acids (FISH OIL) 1000 MG CAPS Take 2 capsules (2,000 mg total) by mouth 2 (two) times daily. 360 capsule 3  . pantoprazole (PROTONIX) 40 MG tablet Take 1 tablet (40 mg total) by mouth daily. 90 tablet 3  . PARoxetine (PAXIL) 40 MG tablet 40MG  BY MOUTH ONCE DAILY (Patient taking differently: Take 40 mg by mouth daily. ) 90 tablet 3  . TRUEPLUS INSULIN SYRINGE 31G X 5/16" 1 ML MISC USE AS DIRECTED TO INJECT 100 each 2  . atorvastatin (LIPITOR) 80 MG tablet Take 1 tablet (80 mg total) by mouth at bedtime. (Patient not taking: Reported on 02/27/2018) 90 tablet 1  . losartan (COZAAR) 50 MG tablet Take 1 tablet (50 mg total) by mouth daily. (Patient taking differently: Take 25 mg by mouth daily. ) 90 tablet 3   No current facility-administered medications on file prior to visit.     Allergies  Allergen Reactions  . Cefuroxime Axetil Other (See Comments)    REACTION: unspecified  . Fentanyl And Related Other (See Comments)    Patch: unknown reaction  . Niaspan [Niacin Er] Other (See Comments)    Burning all over  .  Sulfa Antibiotics Nausea And Vomiting  . Vasotec [Enalapril] Cough  . Welchol [Colesevelam Hcl] Other (See Comments)    Muscle aches and joint pains  . Lyrica [Pregabalin] Rash    Blistering rash around same time as starting drug  . Penicillins Hives and Rash    Has patient had a PCN reaction causing immediate rash, facial/tongue/throat swelling, SOB or lightheadedness with hypotension: Yes Has patient had a  PCN reaction causing severe rash involving mucus membranes or skin necrosis: Yes Has patient had a PCN reaction that required hospitalization: No Has patient had a PCN reaction occurring within the last 10 years: No If all of the above answers are "NO", then may proceed with Cephalosporin use.     Family History  Problem Relation Age of Onset  . Hypertension Mother   . Hyperlipidemia Mother   . Heart disease Mother   . Stroke Sister   . Hypertension Sister   . Hyperlipidemia Sister   . Heart disease Sister   . Diabetes Maternal Grandmother   . Diabetes Daughter     BP 132/80 (BP Location: Right Arm)   Pulse 90   Ht 4\' 11"  (1.499 m)   Wt 205 lb 6.4 oz (93.2 kg)   SpO2 97%   BMI 41.49 kg/m    Review of Systems Denies LOC.      Objective:   Physical Exam VITAL SIGNS:  See vs page GENERAL: no distress Pulses: foot pulses are intact bilaterally.   MSK: no deformity of the feet or ankles.  CV: no edema of the legs or ankles Skin:  no ulcer on the feet or ankles.  normal color and temp on the feet and ankles Neuro: sensation is intact to touch on the feet and ankles.    Lab Results  Component Value Date   HGBA1C 8.5 (A) 02/27/2018       Assessment & Plan:  Type 2 DM, with renal insuff: this a1c does not yet reflect improvement of glycemic control Hypoglycemia: we may need to reduce insulin, but we'll continue the same for now.  Patient Instructions  I have sent a prescription to the health dept, for lantus 110 units each morning check your blood sugar twice  a day.  vary the time of day when you check, between before the 3 meals, and at bedtime.  also check if you have symptoms of your blood sugar being too high or too low.  please keep a record of the readings and bring it to your next appointment here (or you can bring the meter itself).  You can write it on any piece of paper.  please call us sooner if your blood sugar goes below 70, or if you have a lot of readings over 200.  Please come back for a follow-up appointment in 2 months.

## 2018-02-27 NOTE — Patient Instructions (Signed)
I have sent a prescription to the health dept, for lantus 110 units each morning check your blood sugar twice a day.  vary the time of day when you check, between before the 3 meals, and at bedtime.  also check if you have symptoms of your blood sugar being too high or too low.  please keep a record of the readings and bring it to your next appointment here (or you can bring the meter itself).  You can write it on any piece of paper.  please call us sooner if your blood sugar goes below 70, or if you have a lot of readings over 200.  Please come back for a follow-up appointment in 2 months.

## 2018-02-28 ENCOUNTER — Encounter: Payer: Self-pay | Admitting: Family Medicine

## 2018-02-28 ENCOUNTER — Ambulatory Visit
Admission: RE | Admit: 2018-02-28 | Discharge: 2018-02-28 | Disposition: A | Discharge status: Home / Self Care | Payer: Medicare HMO | Source: Ambulatory Visit | Attending: Family Medicine | Admitting: Family Medicine

## 2018-02-28 ENCOUNTER — Ambulatory Visit (INDEPENDENT_AMBULATORY_CARE_PROVIDER_SITE_OTHER): Payer: Medicare HMO | Admitting: Family Medicine

## 2018-02-28 VITALS — BP 150/100 | Temp 98.2°F | Resp 24 | Ht 59.0 in | Wt 208.0 lb

## 2018-02-28 DIAGNOSIS — R0602 Shortness of breath: Secondary | ICD-10-CM

## 2018-02-28 DIAGNOSIS — I480 Paroxysmal atrial fibrillation: Secondary | ICD-10-CM

## 2018-02-28 DIAGNOSIS — R918 Other nonspecific abnormal finding of lung field: Secondary | ICD-10-CM | POA: Diagnosis not present

## 2018-02-28 LAB — COMPLETE METABOLIC PANEL WITH GFR
AG Ratio: 1.3 (calc) (ref 1.0–2.5)
ALBUMIN MSPROF: 3.7 g/dL (ref 3.6–5.1)
ALT: 18 U/L (ref 6–29)
AST: 28 U/L (ref 10–35)
Alkaline phosphatase (APISO): 65 U/L (ref 33–130)
BUN: 15 mg/dL (ref 7–25)
CHLORIDE: 105 mmol/L (ref 98–110)
CO2: 26 mmol/L (ref 20–32)
Calcium: 8.8 mg/dL (ref 8.6–10.4)
Creat: 0.94 mg/dL (ref 0.50–0.99)
GFR, EST AFRICAN AMERICAN: 74 mL/min/{1.73_m2} (ref >=60)
GFR, Est Non African American: 64 mL/min/{1.73_m2} (ref >=60)
GLOBULIN: 2.8 g/dL (ref 1.9–3.7)
Glucose, Bld: 207 mg/dL — ABNORMAL HIGH (ref 65–99)
Potassium: 4 mmol/L (ref 3.5–5.3)
SODIUM: 140 mmol/L (ref 135–146)
TOTAL PROTEIN: 6.5 g/dL (ref 6.1–8.1)
Total Bilirubin: 0.5 mg/dL (ref 0.2–1.2)

## 2018-02-28 LAB — CBC WITH DIFFERENTIAL/PLATELET
BASOS ABS: 43 {cells}/uL (ref 0–200)
Basophils Relative: 0.5 %
EOS ABS: 128 {cells}/uL (ref 15–500)
Eosinophils Relative: 1.5 %
HEMATOCRIT: 36.8 % (ref 35.0–45.0)
HEMOGLOBIN: 12.1 g/dL (ref 11.7–15.5)
LYMPHS ABS: 1751 {cells}/uL (ref 850–3900)
MCH: 31.2 pg (ref 27.0–33.0)
MCHC: 32.9 g/dL (ref 32.0–36.0)
MCV: 94.8 fL (ref 80.0–100.0)
MPV: 11.9 fL (ref 7.5–12.5)
Monocytes Relative: 6.9 %
NEUTROS ABS: 5993 {cells}/uL (ref 1500–7800)
Neutrophils Relative %: 70.5 %
Platelets: 188 10*3/uL (ref 140–400)
RBC: 3.88 10*6/uL (ref 3.80–5.10)
RDW: 12.8 % (ref 11.0–15.0)
Total Lymphocyte: 20.6 %
WBC mixed population: 587 cells/uL (ref 200–950)
WBC: 8.5 10*3/uL (ref 3.8–10.8)

## 2018-02-28 NOTE — Progress Notes (Signed)
Subjective:    Patient ID: Tina [name], female    DOB: January 26, 1951, 67 y.o.   MRN: [id number]  HPI Patient is a 67 year old who presents today with difficulty breathing.  Past medical history significant for previous admissions for congestive heart failure.  Patient has diastolic dysfunction.  Most recent echocardiogram this year in May revealed an ejection fraction of 60 to 65%.  She has a history of paroxysmal atrial fibrillation as well as a recent CVA.  She is currently on Eliquis for anticoagulation.  Patient had a nuclear medicine stress Myoview study performed in 2013 that revealed no ischemia.  EKG is performed today prior to seeing the patient.  She is in atrial fibrillation with frequent PVCs.  There is no evidence of ischemia or infarction.  She does have some nonspecific ST segment changes in the inferior and lateral leads.  There is no significant change compared to her EKG from May of this year.  Patient states that her breathing has gradually gotten worse over the last few days.  She has increased respiratory rate while talking to me as well as increased work of breathing while talking to me.  She also has audible expiratory wheezes that seem to be upper airway breath sounds on forced expiration.  She reports feeling tight in her chest.  She reports orthopnea.  She is been using her nebulizer more frequently with no relief.  In fact last albuterol treatment she received was this morning at 7:00 which provided very little relief.  Weight is up approximately 6 pounds from her last office visit however she denies any significant swelling.  She denies any cough.  She denies any chest pain.  She denies any pleurisy.  She denies any fever or hemoptysis.  Denies any fevers or chills or recent illnesses.  Clinical presentation appears to be a mixed picture of possible obstructive airway disease coupled with possible diastolic dysfunction/pulmonary edema.  Past Medical History:  Diagnosis Date    . Atrial fibrillation (HCC)    a. diagnosed in 05/2017 --> started on Eliquis for anticoagulation  . Carotid artery occlusion   . CHF (congestive heart failure) (HCC)   . Depression   . Diabetes mellitus   . Fibromyalgia   . GERD (gastroesophageal reflux disease)   . Hyperlipidemia   . Hypertension   . Stroke (HCC)   . Superficial thrombophlebitis    right leg   Past Surgical History:  Procedure Laterality Date  . CHOLECYSTECTOMY  1991  . HERNIA REPAIR  approx 1969  . NECK SURGERY  1998  . TONSILLECTOMY AND ADENOIDECTOMY  1966   Current Outpatient Medications on File Prior to Visit  Medication Sig Dispense Refill  . albuterol (PROVENTIL HFA;VENTOLIN HFA) 108 (90 Base) MCG/ACT inhaler Inhale 2 puffs into the lungs every 6 (six) hours as needed for wheezing or shortness of breath. 1 Inhaler 0  . apixaban (ELIQUIS) 5 MG TABS tablet Take 1 tablet (5 mg total) by mouth 2 (two) times daily. 180 tablet 3  . aspirin EC 81 MG EC tablet Take 1 tablet (81 mg total) by mouth daily. 30 tablet 4  . benzonatate (TESSALON) 100 MG capsule Take 1 capsule (100 mg total) by mouth 3 (three) times daily as needed for cough. 20 capsule 0  . clotrimazole-betamethasone (LOTRISONE) cream Apply 1 application topically 2 (two) times daily. (Patient taking differently: Apply 1 application topically daily as needed (itching). ) 60 g 1  . fenofibrate 54 MG tablet Take 1 tablet (  54 mg total) by mouth daily. 90 tablet 1  . furosemide (LASIX) 40 MG tablet Take 1 tablet (40 mg total) by mouth 2 (two) times daily as needed for fluid. Take 1 tablet twice a day (Patient taking differently: Take 40 mg by mouth 2 (two) times daily as needed for fluid. )    . gabapentin (NEURONTIN) 100 MG capsule Take 1 capsule (100 mg total) by mouth at bedtime. 30 capsule 3  . HYDROcodone-acetaminophen (NORCO) 5-325 MG tablet Take 1 tablet by mouth 2 (two) times daily as needed for moderate pain. 30 tablet 0  . insulin glargine (LANTUS)  100 UNIT/ML injection Inject 1.1 mLs (110 Units total) into the skin every morning. 110 mL 3  . ipratropium (ATROVENT) 0.02 % nebulizer solution Take 2.5 mLs (0.5 mg total) by nebulization every 4 (four) hours as needed for wheezing or shortness of breath. 75 mL 3  . ipratropium (ATROVENT) 0.06 % nasal spray Place 2 sprays into both nostrils 4 (four) times daily as needed for rhinitis. 15 mL 3  . montelukast (SINGULAIR) 10 MG tablet 10MG  BY MOUTH ONCE DAILY (Patient taking differently: Take 10 mg by mouth daily. ) 90 tablet 3  . nystatin (MYCOSTATIN/NYSTOP) powder Apply topically 4 (four) times daily. (Patient taking differently: Apply 1 g topically 4 (four) times daily as needed (rash). ) 180 g 2  . Omega-3 Fatty Acids (FISH OIL) 1000 MG CAPS Take 2 capsules (2,000 mg total) by mouth 2 (two) times daily. 360 capsule 3  . pantoprazole (PROTONIX) 40 MG tablet Take 1 tablet (40 mg total) by mouth daily. 90 tablet 3  . PARoxetine (PAXIL) 40 MG tablet 40MG  BY MOUTH ONCE DAILY (Patient taking differently: Take 40 mg by mouth daily. ) 90 tablet 3  . TRUEPLUS INSULIN SYRINGE 31G X 5/16" 1 ML MISC USE AS DIRECTED TO INJECT 100 each 2  . losartan (COZAAR) 50 MG tablet Take 1 tablet (50 mg total) by mouth daily. (Patient taking differently: Take 25 mg by mouth daily. ) 90 tablet 3   No current facility-administered medications on file prior to visit.    Allergies  Allergen Reactions  . Cefuroxime Axetil Other (See Comments)    REACTION: unspecified  . Fentanyl And Related Other (See Comments)    Patch: unknown reaction  . Niaspan [Niacin Er] Other (See Comments)    Burning all over  . Sulfa Antibiotics Nausea And Vomiting  . Vasotec [Enalapril] Cough  . Welchol [Colesevelam Hcl] Other (See Comments)    Muscle aches and joint pains  . Lyrica [Pregabalin] Rash    Blistering rash around same time as starting drug  . Penicillins Hives and Rash    Has patient had a PCN reaction causing immediate rash,  facial/tongue/throat swelling, SOB or lightheadedness with hypotension: Yes Has patient had a PCN reaction causing severe rash involving mucus membranes or skin necrosis: Yes Has patient had a PCN reaction that required hospitalization: No Has patient had a PCN reaction occurring within the last 10 years: No If all of the above answers are "NO", then may proceed with Cephalosporin use.    Social History   Socioeconomic History  . Marital status: Married    Spouse name: Not on file  . Number of children: Not on file  . Years of education: Not on file  . Highest education level: Not on file  Occupational History  . Not on file  Social Needs  . Financial resource strain: Not hard at all  .  Food insecurity:    Worry: Never true    Inability: Never true  . Transportation needs:    Medical: No    Non-medical: No  Tobacco Use  . Smoking status: Former Smoker    Types: Cigarettes  . Smokeless tobacco: Never Used  Substance and Sexual Activity  . Alcohol use: No  . Drug use: No  . Sexual activity: Not Currently  Lifestyle  . Physical activity:    Days per week: Not on file    Minutes per session: Not on file  . Stress: Not on file  Relationships  . Social connections:    Talks on phone: Not on file    Gets together: Not on file    Attends religious service: Not on file    Active member of club or organization: Not on file    Attends meetings of clubs or organizations: Not on file    Relationship status: Not on file  . Intimate partner violence:    Fear of current or ex partner: Not on file    Emotionally abused: Not on file    Physically abused: Not on file    Forced sexual activity: Not on file  Other Topics Concern  . Not on file  Social History Narrative  . Not on file      Review of Systems  All other systems reviewed and are negative.      Objective:   Physical Exam  Constitutional: She appears well-developed and well-nourished. No distress.  Neck: No  JVD present.  Cardiovascular: An irregularly irregular rhythm present. Tachycardia present.  Pulmonary/Chest: Tachypnea noted. She has rhonchi in the right lower field and the left lower field.  Abdominal: Soft. Bowel sounds are normal. She exhibits no distension. There is no tenderness. There is no guarding.  Musculoskeletal: She exhibits edema.  Skin: She is not diaphoretic.     Wt Readings from Last 3 Encounters:  02/28/18 208 lb (94.3 kg)  02/27/18 205 lb 6.4 oz (93.2 kg)  12/19/17 202 lb (91.6 kg)   Weight is up 6 pounds since when she was last seen in July     Assessment & Plan:  SOB (shortness of breath) - Plan: EKG 12-Lead, CBC with Differential/Platelet, COMPLETE METABOLIC PANEL WITH GFR, Brain natriuretic peptide, DG Chest 2 View  PAF (paroxysmal atrial fibrillation) [facility])  Patient received a DuoNeb in the office today.  After receiving this, she continued to have occasional expiratory wheezes when she had forced expiration.  However this seemed to be upper airway breath sounds there were artificial.  Instead, she had pronounced bibasilar crackles and rhonchorous breath sounds throughout her lung suggesting pulmonary edema.  This could be appreciated better after the DuoNeb.  Therefore I believe the patient is dealing with diastolic dysfunction/pulmonary edema causing her dyspnea on exertion.  I have recommended increasing her Lasix to 80 mg twice a day.  I recommended that she take K dur 20 mEq tablet per day.  Recheck here tomorrow and then follow-up with me again on Thursday to ensure improvement prior to the weekend.  Go to the emergency room immediately if worsening.  I will send the patient for a chest x-ray today and also check a BNP to rule out other potential causes.

## 2018-03-01 ENCOUNTER — Encounter: Payer: Self-pay | Admitting: Physician Assistant

## 2018-03-01 ENCOUNTER — Ambulatory Visit (INDEPENDENT_AMBULATORY_CARE_PROVIDER_SITE_OTHER): Payer: Medicare HMO | Admitting: Physician Assistant

## 2018-03-01 VITALS — BP 134/90 | HR 78 | Temp 98.2°F | Resp 20 | Ht 59.0 in | Wt 202.2 lb

## 2018-03-01 DIAGNOSIS — I5033 Acute on chronic diastolic (congestive) heart failure: Secondary | ICD-10-CM

## 2018-03-01 LAB — BRAIN NATRIURETIC PEPTIDE: Brain Natriuretic Peptide: 288 pg/mL — ABNORMAL HIGH (ref <100)

## 2018-03-01 NOTE — Progress Notes (Signed)
Patient ID: [name] MRN: [id number], DOB: 1951-01-21, 67 y.o. Date of Encounter: 03/01/2018, 12:05 PM    Chief Complaint:  Chief Complaint  Patient presents with  . Follow-up    fluid in lungs     HPI: 67 y.o. year old female     THE FOLLOWING IS COPIED DIRECTLY FROM DR. PICKARD'S OV NOTE YESTERDAY: HPI Patient is a 67 year old who presents today with difficulty breathing.  Past medical history significant for previous admissions for congestive heart failure.  Patient has diastolic dysfunction.  Most recent echocardiogram this year in May revealed an ejection fraction of 60 to 65%.  She has a history of paroxysmal atrial fibrillation as well as a recent CVA.  She is currently on Eliquis for anticoagulation.  Patient had a nuclear medicine stress Myoview study performed in 2013 that revealed no ischemia.  EKG is performed today prior to seeing the patient.  She is in atrial fibrillation with frequent PVCs.  There is no evidence of ischemia or infarction.  She does have some nonspecific ST segment changes in the inferior and lateral leads.  There is no significant change compared to her EKG from May of this year.  Patient states that her breathing has gradually gotten worse over the last few days.  She has increased respiratory rate while talking to me as well as increased work of breathing while talking to me.  She also has audible expiratory wheezes that seem to be upper airway breath sounds on forced expiration.  She reports feeling tight in her chest.  She reports orthopnea.  She is been using her nebulizer more frequently with no relief.  In fact last albuterol treatment she received was this morning at 7:00 which provided very little relief.  Weight is up approximately 6 pounds from her last office visit however she denies any significant swelling.  She denies any cough.  She denies any chest pain.  She denies any pleurisy.  She denies any fever or hemoptysis.  Denies any fevers  or chills or recent illnesses.  Clinical presentation appears to be a mixed picture of possible obstructive airway disease coupled with possible diastolic dysfunction/pulmonary edema.  Exam was notable for: Irregularly irregular heart rhythm Lungs: Tachypnea. Rhonchi in right lower field and left lower field  SOB (shortness of breath) - Plan: EKG 12-Lead, CBC with Differential/Platelet, COMPLETE METABOLIC PANEL WITH GFR, Brain natriuretic peptide, DG Chest 2 View  PAF (paroxysmal atrial fibrillation) [facility])  Patient received a DuoNeb in the office today.  After receiving this, she continued to have occasional expiratory wheezes when she had forced expiration.  However this seemed to be upper airway breath sounds there were artificial.  Instead, she had pronounced bibasilar crackles and rhonchorous breath sounds throughout her lung suggesting pulmonary edema.  This could be appreciated better after the DuoNeb.  Therefore I believe the patient is dealing with diastolic dysfunction/pulmonary edema causing her dyspnea on exertion.  I have recommended increasing her Lasix to 80 mg twice a day.  I recommended that she take K dur 20 mEq tablet per day.  Recheck here tomorrow and then follow-up with me again on Thursday to ensure improvement prior to the weekend.  Go to the emergency room immediately if worsening.  I will send the patient for a chest x-ray today and also check a BNP to rule out other potential causes.  Also  I reviewed chest x-ray report from 02/28/2018. Impression; Bilateral alveolar and interstitial opacities with central predominance with  mild fissural thickening.  Findings are most consistent with developing mixed pattern pulmonary edema.  Also reviewed lab results from 02/28/2018. CBC was normal.  White count normal at 8.5.   Glucose elevated but remainder of CME T was normal.  Specifically BUN and creatinine were normal.   BNP slightly elevated at  288.   ------------------------TODAY-------03/01/2018:------------------------------------------------------------  Today when I enter exam room patient is sitting in the chair comfortably.   She does not appear short of breath or dyspneic.   She is speaking to me with no increased work of breathing and no audible wheezes. She reports " I feel a lot better.  I still can tell there is more there (referencing congestion), but feel a lot better." She reports that yesterday after she left here she went and got chest x-ray and then went home and took a dose of Lasix and then took another dose of Lasix last night.   States that she got in 2 doses of Lasix yesterday.   States that she has "lost 6 pounds ".   She has taken no Lasix yet today because she did not want to take that then come here and be needing to urinate.   Plans to go home from here and take dose of Lasix immediately after this visit. Also she reports that she is already scheduled another visit with Dr. Tanya Nones for tomorrow morning.    Home Meds:   Outpatient Medications Prior to Visit  Medication Sig Dispense Refill  . albuterol (PROVENTIL HFA;VENTOLIN HFA) 108 (90 Base) MCG/ACT inhaler Inhale 2 puffs into the lungs every 6 (six) hours as needed for wheezing or shortness of breath. 1 Inhaler 0  . apixaban (ELIQUIS) 5 MG TABS tablet Take 1 tablet (5 mg total) by mouth 2 (two) times daily. 180 tablet 3  . aspirin EC 81 MG EC tablet Take 1 tablet (81 mg total) by mouth daily. 30 tablet 4  . benzonatate (TESSALON) 100 MG capsule Take 1 capsule (100 mg total) by mouth 3 (three) times daily as needed for cough. 20 capsule 0  . clotrimazole-betamethasone (LOTRISONE) cream Apply 1 application topically 2 (two) times daily. (Patient taking differently: Apply 1 application topically daily as needed (itching). ) 60 g 1  . fenofibrate 54 MG tablet Take 1 tablet (54 mg total) by mouth daily. 90 tablet 1  . furosemide (LASIX) 40 MG tablet Take  1 tablet (40 mg total) by mouth 2 (two) times daily as needed for fluid. Take 1 tablet twice a day (Patient taking differently: Take 40 mg by mouth 2 (two) times daily as needed for fluid. )    . gabapentin (NEURONTIN) 100 MG capsule Take 1 capsule (100 mg total) by mouth at bedtime. 30 capsule 3  . HYDROcodone-acetaminophen (NORCO) 5-325 MG tablet Take 1 tablet by mouth 2 (two) times daily as needed for moderate pain. 30 tablet 0  . insulin glargine (LANTUS) 100 UNIT/ML injection Inject 1.1 mLs (110 Units total) into the skin every morning. 110 mL 3  . ipratropium (ATROVENT) 0.02 % nebulizer solution Take 2.5 mLs (0.5 mg total) by nebulization every 4 (four) hours as needed for wheezing or shortness of breath. 75 mL 3  . ipratropium (ATROVENT) 0.06 % nasal spray Place 2 sprays into both nostrils 4 (four) times daily as needed for rhinitis. 15 mL 3  . losartan (COZAAR) 50 MG tablet Take 1 tablet (50 mg total) by mouth daily. (Patient taking differently: Take 25 mg by mouth daily. ) 90  tablet 3  . montelukast (SINGULAIR) 10 MG tablet 10MG  BY MOUTH ONCE DAILY (Patient taking differently: Take 10 mg by mouth daily. ) 90 tablet 3  . nystatin (MYCOSTATIN/NYSTOP) powder Apply topically 4 (four) times daily. (Patient taking differently: Apply 1 g topically 4 (four) times daily as needed (rash). ) 180 g 2  . Omega-3 Fatty Acids (FISH OIL) 1000 MG CAPS Take 2 capsules (2,000 mg total) by mouth 2 (two) times daily. 360 capsule 3  . pantoprazole (PROTONIX) 40 MG tablet Take 1 tablet (40 mg total) by mouth daily. 90 tablet 3  . PARoxetine (PAXIL) 40 MG tablet 40MG  BY MOUTH ONCE DAILY (Patient taking differently: Take 40 mg by mouth daily. ) 90 tablet 3  . TRUEPLUS INSULIN SYRINGE 31G X 5/16" 1 ML MISC USE AS DIRECTED TO INJECT 100 each 2   No facility-administered medications prior to visit.     Allergies:  Allergies  Allergen Reactions  . Cefuroxime Axetil Other (See Comments)    REACTION: unspecified  .  Fentanyl And Related Other (See Comments)    Patch: unknown reaction  . Niaspan [Niacin Er] Other (See Comments)    Burning all over  . Sulfa Antibiotics Nausea And Vomiting  . Vasotec [Enalapril] Cough  . Welchol [Colesevelam Hcl] Other (See Comments)    Muscle aches and joint pains  . Lyrica [Pregabalin] Rash    Blistering rash around same time as starting drug  . Penicillins Hives and Rash    Has patient had a PCN reaction causing immediate rash, facial/tongue/throat swelling, SOB or lightheadedness with hypotension: Yes Has patient had a PCN reaction causing severe rash involving mucus membranes or skin necrosis: Yes Has patient had a PCN reaction that required hospitalization: No Has patient had a PCN reaction occurring within the last 10 years: No If all of the above answers are "NO", then may proceed with Cephalosporin use.       Review of Systems: See HPI for pertinent ROS. All other ROS negative.    Physical Exam: Blood pressure 134/90, pulse 78, temperature 98.2 F (36.8 C), temperature source Oral, resp. rate 20, height 4\' 11"  (1.499 m), weight 91.7 kg, SpO2 94 %., Body mass index is 40.84 kg/m. General: WF.  Appears in no acute distress. Neck: Supple. No thyromegaly. No lymphadenopathy. Lungs: Clear bilaterally to auscultation without wheezes, rales, or rhonchi. Breathing is unlabored.  Breathing is not labored at all.  Lungs sound clear. Few basilar crackles o/w clear. No wheezes, no rhonchi, no rales.  Heart: Regular rhythm. No murmurs, rubs, or gallops. Msk:  Strength and tone normal for age. Extremities/Skin: Warm and dry.  No edema. Neuro: Alert and oriented X 3. Moves all extremities spontaneously. Gait is normal. CNII-XII grossly in tact. Psych:  Responds to questions appropriately with a normal affect.     ASSESSMENT AND PLAN:  67 y.o. year old female with   1. Acute on chronic diastolic heart failure (HCC)  Weight is back down to 91.7 kg. She has had  excellent diuresis with increased Lasix. Today I considered decreasing the dose of Lasix some but patient states that she feels that she still has some congestion even though she feels much better compared to yesterday.   Will continue current  dosing of Lasix for today and she is already has follow-up scheduled with Dr. Tanya Nones for tomorrow morning so will reassess at that time.  [address] [place], [place], [facility] 03/01/2018 12:05 PM

## 2018-03-02 ENCOUNTER — Ambulatory Visit: Payer: Medicare HMO | Admitting: Adult Health

## 2018-03-02 ENCOUNTER — Encounter: Payer: Self-pay | Admitting: Family Medicine

## 2018-03-02 ENCOUNTER — Ambulatory Visit (INDEPENDENT_AMBULATORY_CARE_PROVIDER_SITE_OTHER): Payer: Medicare HMO | Admitting: Family Medicine

## 2018-03-02 VITALS — BP 100/60 | HR 100 | Temp 98.0°F | Resp 20 | Ht 59.0 in | Wt 198.0 lb

## 2018-03-02 DIAGNOSIS — J810 Acute pulmonary edema: Secondary | ICD-10-CM | POA: Diagnosis not present

## 2018-03-02 DIAGNOSIS — I5033 Acute on chronic diastolic (congestive) heart failure: Secondary | ICD-10-CM

## 2018-03-02 LAB — COMPLETE METABOLIC PANEL WITH GFR
AG Ratio: 1.2 (calc) (ref 1.0–2.5)
ALT: 17 U/L (ref 6–29)
AST: 16 U/L (ref 10–35)
Albumin: 3.9 g/dL (ref 3.6–5.1)
Alkaline phosphatase (APISO): 70 U/L (ref 33–130)
BILIRUBIN TOTAL: 0.6 mg/dL (ref 0.2–1.2)
BUN: 18 mg/dL (ref 7–25)
CHLORIDE: 98 mmol/L (ref 98–110)
CO2: 28 mmol/L (ref 20–32)
Calcium: 9.1 mg/dL (ref 8.6–10.4)
Creat: 0.92 mg/dL (ref 0.50–0.99)
GFR, EST AFRICAN AMERICAN: 76 mL/min/{1.73_m2} (ref >=60)
GFR, Est Non African American: 65 mL/min/{1.73_m2} (ref >=60)
Globulin: 3.2 g/dL (calc) (ref 1.9–3.7)
Glucose, Bld: 199 mg/dL — ABNORMAL HIGH (ref 65–99)
Potassium: 3.4 mmol/L — ABNORMAL LOW (ref 3.5–5.3)
Sodium: 137 mmol/L (ref 135–146)
TOTAL PROTEIN: 7.1 g/dL (ref 6.1–8.1)

## 2018-03-02 NOTE — Progress Notes (Signed)
Subjective:    Patient ID: Tina [name], female    DOB: [birth year], 67 y.o.   MRN: [id number]  HPI  02/28/18 Patient is a 67 year old who presents today with difficulty breathing.  Past medical history significant for previous admissions for congestive heart failure.  Patient has diastolic dysfunction.  Most recent echocardiogram this year in May revealed an ejection fraction of 60 to 65%.  She has a history of paroxysmal atrial fibrillation as well as a recent CVA.  She is currently on Eliquis for anticoagulation.  Patient had a nuclear medicine stress Myoview study performed in 2013 that revealed no ischemia.  EKG is performed today prior to seeing the patient.  She is in atrial fibrillation with frequent PVCs.  There is no evidence of ischemia or infarction.  She does have some nonspecific ST segment changes in the inferior and lateral leads.  There is no significant change compared to her EKG from May of this year.  Patient states that her breathing has gradually gotten worse over the last few days.  She has increased respiratory rate while talking to me as well as increased work of breathing while talking to me.  She also has audible expiratory wheezes that seem to be upper airway breath sounds on forced expiration.  She reports feeling tight in her chest.  She reports orthopnea.  She is been using her nebulizer more frequently with no relief.  In fact last albuterol treatment she received was this morning at 7:00 which provided very little relief.  Weight is up approximately 6 pounds from her last office visit however she denies any significant swelling.  She denies any cough.  She denies any chest pain.  She denies any pleurisy.  She denies any fever or hemoptysis.  Denies any fevers or chills or recent illnesses.  Clinical presentation appears to be a mixed picture of possible obstructive airway disease coupled with possible diastolic dysfunction/pulmonary edema.  At that time, my plan  was: Patient received a DuoNeb in the office today.  After receiving this, she continued to have occasional expiratory wheezes when she had forced expiration.  However this seemed to be upper airway breath sounds there were artificial.  Instead, she had pronounced bibasilar crackles and rhonchorous breath sounds throughout her lung suggesting pulmonary edema.  This could be appreciated better after the DuoNeb.  Therefore I believe the patient is dealing with diastolic dysfunction/pulmonary edema causing her dyspnea on exertion.  I have recommended increasing her Lasix to 80 mg twice a day.  I recommended that she take K dur 20 mEq tablet per day.  Recheck here tomorrow and then follow-up with me again on Thursday to ensure improvement prior to the weekend.  Go to the emergency room immediately if worsening.  I will send the patient for a chest x-ray today and also check a BNP to rule out other potential causes.  03/02/18 Chest x-ray revealed: IMPRESSION: Bilateral alveolar and interstitial opacities with central predominance with mild fissural thickening. Findings are most consistent with developing mixed pattern pulmonary edema. BNP was elevated at 288.  Therefore, the patient appeared to have acute on chronic diastolic heart failure with pulmonary edema. Wt Readings from Last 3 Encounters:  03/02/18 198 lb (89.8 kg)  03/01/18 202 lb 3.2 oz (91.7 kg)  02/28/18 208 lb (94.3 kg)   Weight is down 10 pounds in 2 days.  Patient states that her breathing is approaching her baseline.  She feels much better.  She does feel  little bit weak and I am concerned that she may be starting to become dehydrated due to the aggressive diuresis.  She has been taking potassium as instructed.  She denies any cramps or palpitations.  She denies any chest pain.  She denies any fever or cough.  Review of her past records state that she is due for mammogram, a diabetic eye exam, and a booster on Pneumovax 23.  She would like  to defer these things at the present time until she feels better which I believe is completely reasonable.   Past Medical History:  Diagnosis Date  . Atrial fibrillation (HCC)    a. diagnosed in 05/2017 --> started on Eliquis for anticoagulation  . Carotid artery occlusion   . CHF (congestive heart failure) (HCC)   . Depression   . Diabetes mellitus   . Fibromyalgia   . GERD (gastroesophageal reflux disease)   . Hyperlipidemia   . Hypertension   . Stroke (HCC)   . Superficial thrombophlebitis    right leg   Past Surgical History:  Procedure Laterality Date  . CHOLECYSTECTOMY  1991  . HERNIA REPAIR  approx 1969  . NECK SURGERY  1998  . TONSILLECTOMY AND ADENOIDECTOMY  1966   Current Outpatient Medications on File Prior to Visit  Medication Sig Dispense Refill  . albuterol (PROVENTIL HFA;VENTOLIN HFA) 108 (90 Base) MCG/ACT inhaler Inhale 2 puffs into the lungs every 6 (six) hours as needed for wheezing or shortness of breath. 1 Inhaler 0  . apixaban (ELIQUIS) 5 MG TABS tablet Take 1 tablet (5 mg total) by mouth 2 (two) times daily. 180 tablet 3  . aspirin EC 81 MG EC tablet Take 1 tablet (81 mg total) by mouth daily. 30 tablet 4  . benzonatate (TESSALON) 100 MG capsule Take 1 capsule (100 mg total) by mouth 3 (three) times daily as needed for cough. 20 capsule 0  . clotrimazole-betamethasone (LOTRISONE) cream Apply 1 application topically 2 (two) times daily. (Patient taking differently: Apply 1 application topically daily as needed (itching). ) 60 g 1  . fenofibrate 54 MG tablet Take 1 tablet (54 mg total) by mouth daily. 90 tablet 1  . furosemide (LASIX) 40 MG tablet Take 1 tablet (40 mg total) by mouth 2 (two) times daily as needed for fluid. Take 1 tablet twice a day (Patient taking differently: Take 40 mg by mouth 2 (two) times daily as needed for fluid. )    . gabapentin (NEURONTIN) 100 MG capsule Take 1 capsule (100 mg total) by mouth at bedtime. 30 capsule 3  .  HYDROcodone-acetaminophen (NORCO) 5-325 MG tablet Take 1 tablet by mouth 2 (two) times daily as needed for moderate pain. 30 tablet 0  . insulin glargine (LANTUS) 100 UNIT/ML injection Inject 1.1 mLs (110 Units total) into the skin every morning. 110 mL 3  . ipratropium (ATROVENT) 0.02 % nebulizer solution Take 2.5 mLs (0.5 mg total) by nebulization every 4 (four) hours as needed for wheezing or shortness of breath. 75 mL 3  . ipratropium (ATROVENT) 0.06 % nasal spray Place 2 sprays into both nostrils 4 (four) times daily as needed for rhinitis. 15 mL 3  . losartan (COZAAR) 50 MG tablet Take 1 tablet (50 mg total) by mouth daily. (Patient taking differently: Take 25 mg by mouth daily. ) 90 tablet 3  . montelukast (SINGULAIR) 10 MG tablet 10MG  BY MOUTH ONCE DAILY (Patient taking differently: Take 10 mg by mouth daily. ) 90 tablet 3  . nystatin (  MYCOSTATIN/NYSTOP) powder Apply topically 4 (four) times daily. (Patient taking differently: Apply 1 g topically 4 (four) times daily as needed (rash). ) 180 g 2  . Omega-3 Fatty Acids (FISH OIL) 1000 MG CAPS Take 2 capsules (2,000 mg total) by mouth 2 (two) times daily. 360 capsule 3  . pantoprazole (PROTONIX) 40 MG tablet Take 1 tablet (40 mg total) by mouth daily. 90 tablet 3  . PARoxetine (PAXIL) 40 MG tablet 40MG  BY MOUTH ONCE DAILY (Patient taking differently: Take 40 mg by mouth daily. ) 90 tablet 3  . TRUEPLUS INSULIN SYRINGE 31G X 5/16" 1 ML MISC USE AS DIRECTED TO INJECT 100 each 2   No current facility-administered medications on file prior to visit.    Allergies  Allergen Reactions  . Cefuroxime Axetil Other (See Comments)    REACTION: unspecified  . Fentanyl And Related Other (See Comments)    Patch: unknown reaction  . Niaspan [Niacin Er] Other (See Comments)    Burning all over  . Sulfa Antibiotics Nausea And Vomiting  . Vasotec [Enalapril] Cough  . Welchol [Colesevelam Hcl] Other (See Comments)    Muscle aches and joint pains  . Lyrica  [Pregabalin] Rash    Blistering rash around same time as starting drug  . Penicillins Hives and Rash    Has patient had a PCN reaction causing immediate rash, facial/tongue/throat swelling, SOB or lightheadedness with hypotension: Yes Has patient had a PCN reaction causing severe rash involving mucus membranes or skin necrosis: Yes Has patient had a PCN reaction that required hospitalization: No Has patient had a PCN reaction occurring within the last 10 years: No If all of the above answers are "NO", then may proceed with Cephalosporin use.    Social History   Socioeconomic History  . Marital status: Married    Spouse name: Not on file  . Number of children: Not on file  . Years of education: Not on file  . Highest education level: Not on file  Occupational History  . Not on file  Social Needs  . Financial resource strain: Not hard at all  . Food insecurity:    Worry: Never true    Inability: Never true  . Transportation needs:    Medical: No    Non-medical: No  Tobacco Use  . Smoking status: Former Smoker    Types: Cigarettes  . Smokeless tobacco: Never Used  Substance and Sexual Activity  . Alcohol use: No  . Drug use: No  . Sexual activity: Not Currently  Lifestyle  . Physical activity:    Days per week: Not on file    Minutes per session: Not on file  . Stress: Not on file  Relationships  . Social connections:    Talks on phone: Not on file    Gets together: Not on file    Attends religious service: Not on file    Active member of club or organization: Not on file    Attends meetings of clubs or organizations: Not on file    Relationship status: Not on file  . Intimate partner violence:    Fear of current or ex partner: Not on file    Emotionally abused: Not on file    Physically abused: Not on file    Forced sexual activity: Not on file  Other Topics Concern  . Not on file  Social History Narrative  . Not on file      Review of Systems  All other  systems  reviewed and are negative.      Objective:   Physical Exam  Constitutional: She appears well-developed and well-nourished. No distress.  Neck: No JVD present.  Cardiovascular: Normal rate. An irregularly irregular rhythm present.  Pulmonary/Chest: No accessory muscle usage. No tachypnea. No respiratory distress. She has no wheezes. She has no rhonchi.  Abdominal: Soft. Bowel sounds are normal. She exhibits no distension. There is no tenderness. There is no guarding.  Musculoskeletal: She exhibits no edema.  Skin: She is not diaphoretic.      Assessment & Plan:  Acute pulmonary edema (HCC) - Plan: COMPLETE METABOLIC PANEL WITH GFR  Acute on chronic diastolic heart failure  Decrease Lasix to 40 mg twice daily which was the patient's previous baseline dose.  Discontinue potassium.  Check CMP to monitor electrolytes given aggressive diuresis to ensure she does not need electrolyte replacement.  I have asked the patient to weigh herself daily.  This is the best way to monitor for signs of pulmonary edema and heart failure.  She is to notify us if she gains more than 1 pound on 2 or 3 consecutive days or if she sees more than a 3 pound weight gain in 1 day.  I also recommended Pneumovax 23 and a mammogram and a diabetic eye exam when she is ready.  She states that she will notify us.

## 2018-03-10 ENCOUNTER — Telehealth: Payer: Self-pay | Admitting: Endocrinology

## 2018-03-10 NOTE — Telephone Encounter (Signed)
Called pt and informed her that the paperwork was faxed successfully on 03/01/18. Verbalized acceptance and understanding.

## 2018-03-10 NOTE — Telephone Encounter (Signed)
Patient stated that the health dept faxed over paper work for Dr Everardo All to sign. This is to help her get her insulin. She is calling to see if Dr Everardo All has had a chance to sign this paper work   Please advise

## 2018-03-13 ENCOUNTER — Telehealth: Payer: Self-pay | Admitting: Endocrinology

## 2018-03-13 NOTE — Telephone Encounter (Signed)
[place] Called stating they have not received packet on assistance for Basaglar back from Dr. Everardo All. Faxed over on 10/6. Please Advise. Fax: [phone]

## 2018-03-13 NOTE — Telephone Encounter (Signed)
Packet was faxed on 03/01/18 with conformation received, no phone # attached to message or paperwork

## 2018-03-17 ENCOUNTER — Telehealth: Payer: Self-pay | Admitting: Family Medicine

## 2018-03-17 MED ORDER — FUROSEMIDE 40 MG PO TABS: 40.0000 mg | ORAL_TABLET | Freq: Two times a day (BID) | ORAL | 3 refills | Status: DC | PRN

## 2018-03-17 NOTE — Telephone Encounter (Signed)
Pt will be out of lasix tomorrow please call in to [facility] [place]

## 2018-03-17 NOTE — Telephone Encounter (Signed)
Prescription sent to pharmacy.

## 2018-03-27 ENCOUNTER — Other Ambulatory Visit: Payer: Self-pay

## 2018-03-27 ENCOUNTER — Telehealth: Payer: Self-pay | Admitting: Endocrinology

## 2018-03-27 MED ORDER — BASAGLAR KWIKPEN 100 UNIT/ML ~~LOC~~ SOPN: 110.0000 [IU] | PEN_INJECTOR | SUBCUTANEOUS | 1 refills | Status: DC

## 2018-03-27 NOTE — Telephone Encounter (Signed)
[employer] called stating they needed a prior auth on medication Lantus SolarStar Pen but wanted to give some alternatives: [facility] [address] [place] [phone]

## 2018-03-27 NOTE — Telephone Encounter (Signed)
Please let me know if you want me to complete this PA or if you wish to change the medication. Thanks

## 2018-03-27 NOTE — Telephone Encounter (Signed)
Rx for Basaglar sent to pt pharmacy of choice. Called pt to make her aware of the changes made to her insulin d/t insurance coverage. LVM requesting returned call.

## 2018-03-27 NOTE — Telephone Encounter (Signed)
Ok to change to basaglar--same dosage.

## 2018-03-27 NOTE — Telephone Encounter (Signed)
Pt returned call. Informed of change in Rx from Lantus Solostar to [employer] coverage. Pt expressed concern re: out of pocket expense. Further states she has reached out to the Health Dept to see if they could help off set her out of pocket expense. Advised to request [facility] transfer her Rx IF she chooses to utilize [facility] for her Rx. Verbalized acceptance and understanding.

## 2018-03-28 ENCOUNTER — Telehealth: Payer: Self-pay | Admitting: Family Medicine

## 2018-03-28 NOTE — Telephone Encounter (Signed)
We could switch to coumadin but otherwise, I have no other options.  Please explain coumadin requires lab checks every week until therapeutic then monthly thereafter.

## 2018-03-28 NOTE — Telephone Encounter (Signed)
Patient stopped by office to discuss.   States that she is completely out of medication and cannot afford the refill. Reports that she does not want medication changed to another sample.   States that she has not been able to afford many of her medications and thus has not been taking them.   Reports that she makes too much to apply for prescription assistance.

## 2018-03-28 NOTE — Telephone Encounter (Signed)
Pt is in doughnut hole and cannot afford her eliquis we do not have any samples, please advise.

## 2018-03-28 NOTE — Telephone Encounter (Signed)
Call placed to patient. LMTRC.  

## 2018-03-29 NOTE — Telephone Encounter (Signed)
Call placed to patient. LMTRC.  

## 2018-03-30 NOTE — Telephone Encounter (Signed)
Call placed to patient. LMTRC.  

## 2018-03-31 NOTE — Telephone Encounter (Signed)
Call placed to patient and patient made aware.   States that she will have to think about changing to Coumadin. Advised that Coumadin requires frequent labs and dietary restrictions.   Patient also out of Basaglar. Advised we do have samples and she can pick those up.

## 2018-04-26 ENCOUNTER — Ambulatory Visit: Payer: Medicare HMO | Admitting: Cardiovascular Disease

## 2018-04-26 DIAGNOSIS — R0989 Other specified symptoms and signs involving the circulatory and respiratory systems: Secondary | ICD-10-CM

## 2018-04-27 ENCOUNTER — Encounter: Payer: Self-pay | Admitting: Cardiovascular Disease

## 2018-05-01 ENCOUNTER — Ambulatory Visit: Payer: Medicare HMO | Admitting: Endocrinology

## 2018-05-01 DIAGNOSIS — Z0289 Encounter for other administrative examinations: Secondary | ICD-10-CM

## 2018-05-05 ENCOUNTER — Other Ambulatory Visit: Payer: Self-pay | Admitting: Family Medicine

## 2018-05-05 MED ORDER — HYDROCODONE-ACETAMINOPHEN 5-325 MG PO TABS: 1.0000 | ORAL_TABLET | Freq: Two times a day (BID) | ORAL | 0 refills | Status: DC | PRN

## 2018-05-05 NOTE — Telephone Encounter (Signed)
Ok to refill??  Last office visit 03/02/2018.  Last refill 11/30/2017.

## 2018-05-05 NOTE — Telephone Encounter (Signed)
Pt needs refill on hydrocodone to [facility].

## 2018-05-22 ENCOUNTER — Ambulatory Visit: Payer: Medicare HMO | Admitting: Family Medicine

## 2018-05-23 ENCOUNTER — Ambulatory Visit: Payer: Medicare HMO | Admitting: Cardiovascular Disease

## 2018-05-23 ENCOUNTER — Encounter: Payer: Self-pay | Admitting: Family Medicine

## 2018-05-23 ENCOUNTER — Encounter: Payer: Self-pay | Admitting: Cardiovascular Disease

## 2018-05-23 VITALS — BP 118/90 | HR 90 | Ht 59.0 in | Wt 204.0 lb

## 2018-05-23 DIAGNOSIS — I480 Paroxysmal atrial fibrillation: Secondary | ICD-10-CM | POA: Diagnosis not present

## 2018-05-23 DIAGNOSIS — I639 Cerebral infarction, unspecified: Secondary | ICD-10-CM

## 2018-05-23 DIAGNOSIS — R0602 Shortness of breath: Secondary | ICD-10-CM | POA: Diagnosis not present

## 2018-05-23 DIAGNOSIS — E785 Hyperlipidemia, unspecified: Secondary | ICD-10-CM | POA: Diagnosis not present

## 2018-05-23 DIAGNOSIS — I6523 Occlusion and stenosis of bilateral carotid arteries: Secondary | ICD-10-CM

## 2018-05-23 DIAGNOSIS — I5033 Acute on chronic diastolic (congestive) heart failure: Secondary | ICD-10-CM | POA: Diagnosis not present

## 2018-05-23 DIAGNOSIS — Z7901 Long term (current) use of anticoagulants: Secondary | ICD-10-CM

## 2018-05-23 DIAGNOSIS — I10 Essential (primary) hypertension: Secondary | ICD-10-CM | POA: Diagnosis not present

## 2018-05-23 MED ORDER — FUROSEMIDE 40 MG PO TABS: 40.0000 mg | ORAL_TABLET | Freq: Two times a day (BID) | ORAL | Status: DC

## 2018-05-23 MED ORDER — POTASSIUM CHLORIDE CRYS ER 20 MEQ PO TBCR: 20.0000 meq | EXTENDED_RELEASE_TABLET | Freq: Every day | ORAL | 6 refills | Status: DC

## 2018-05-23 MED ORDER — FUROSEMIDE 40 MG PO TABS: 40.0000 mg | ORAL_TABLET | Freq: Every day | ORAL | Status: DC

## 2018-05-23 NOTE — Patient Instructions (Addendum)
Medication Instructions:   Increase Lasix to 80mg  twice a day x 4 days, then back to 40mg  twice a day.  Potassium 20meq twice a day x 4 days, then back to 20meq daily thereafter.   Continue all other medications.    Labwork: BMET, BNP - orders given today.   Testing/Procedures: Chest x-ray - order given today.   Follow-Up:  6 weeks   Office will contact with results via phone or letter.    Any Other Special Instructions Will Be Listed Below (If Applicable). [facility].   If you need a refill on your cardiac medications before your next appointment, please call your pharmacy.

## 2018-05-23 NOTE — Progress Notes (Signed)
SUBJECTIVE: The patient presents for follow-up of paroxysmal atrial fibrillation and chronic diastolic heart failure.    She suffered an acute nonhemorrhagic right thalamic punctate infarct earlier this year.    Echocardiogram on 10/16/2017 showed normal left ventricular systolic function and regional wall motion, LVEF 60 to 65%, moderate LVH, and mild mitral and tricuspid regurgitation.  There is moderate left atrial dilatation.  Carotid Doppler showed 40 to 59% right internal carotid artery stenosis in 1 to 39% left internal carotid artery stenosis.  She has been short of breath since early October.  She saw her PCP at that time to increase Lasix to 40 mg twice daily.  She has been taking this dose ever since that time.  BNP was mildly elevated at 288 on 02/28/2018.  Basic metabolic panel on 03/02/2018 showed sodium 137, potassium mildly low at 3.4, BUN 18, creatinine 0.92.  Chest x-ray showed developing pulmonary edema.  She has tried nebulizer treatments without relief.  40 mg of Lasix does not cause her to urinate but if she takes 80 mg in a day she is able to empty her bladder.  She denies chest pain.  She has had headaches and more ankle and feet swelling bilaterally.    Review of Systems: As per "subjective", otherwise negative.  Allergies  Allergen Reactions  . Cefuroxime Axetil Other (See Comments)    REACTION: unspecified  . Fentanyl And Related Other (See Comments)    Patch: unknown reaction  . Niaspan [Niacin Er] Other (See Comments)    Burning all over  . Sulfa Antibiotics Nausea And Vomiting  . Vasotec [Enalapril] Cough  . Welchol [Colesevelam Hcl] Other (See Comments)    Muscle aches and joint pains  . Lyrica [Pregabalin] Rash    Blistering rash around same time as starting drug  . Penicillins Hives and Rash    Has patient had a PCN reaction causing immediate rash, facial/tongue/throat swelling, SOB or lightheadedness with hypotension: Yes Has patient had a  PCN reaction causing severe rash involving mucus membranes or skin necrosis: Yes Has patient had a PCN reaction that required hospitalization: No Has patient had a PCN reaction occurring within the last 10 years: No If all of the above answers are "NO", then may proceed with Cephalosporin use.     Current Outpatient Medications  Medication Sig Dispense Refill  . albuterol (PROVENTIL HFA;VENTOLIN HFA) 108 (90 Base) MCG/ACT inhaler Inhale 2 puffs into the lungs every 6 (six) hours as needed for wheezing or shortness of breath. 1 Inhaler 0  . apixaban (ELIQUIS) 5 MG TABS tablet Take 1 tablet (5 mg total) by mouth 2 (two) times daily. 180 tablet 3  . aspirin EC 81 MG EC tablet Take 1 tablet (81 mg total) by mouth daily. 30 tablet 4  . benzonatate (TESSALON) 100 MG capsule Take 1 capsule (100 mg total) by mouth 3 (three) times daily as needed for cough. 20 capsule 0  . clotrimazole-betamethasone (LOTRISONE) cream Apply 1 application topically 2 (two) times daily. (Patient taking differently: Apply 1 application topically daily as needed (itching). ) 60 g 1  . fenofibrate 54 MG tablet Take 1 tablet (54 mg total) by mouth daily. 90 tablet 1  . furosemide (LASIX) 40 MG tablet Take 1 tablet (40 mg total) by mouth 2 (two) times daily as needed for fluid. 60 tablet 3  . gabapentin (NEURONTIN) 100 MG capsule Take 1 capsule (100 mg total) by mouth at bedtime. 30 capsule 3  .  HYDROcodone-acetaminophen (NORCO) 5-325 MG tablet Take 1 tablet by mouth 2 (two) times daily as needed for moderate pain. 30 tablet 0  . Insulin Glargine (BASAGLAR KWIKPEN) 100 UNIT/ML SOPN Inject 1.1 mLs (110 Units total) into the skin every morning. 33 mL 1  . ipratropium (ATROVENT) 0.02 % nebulizer solution Take 2.5 mLs (0.5 mg total) by nebulization every 4 (four) hours as needed for wheezing or shortness of breath. 75 mL 3  . ipratropium (ATROVENT) 0.06 % nasal spray Place 2 sprays into both nostrils 4 (four) times daily as needed  for rhinitis. 15 mL 3  . montelukast (SINGULAIR) 10 MG tablet 10MG  BY MOUTH ONCE DAILY (Patient taking differently: Take 10 mg by mouth daily. ) 90 tablet 3  . nystatin (MYCOSTATIN/NYSTOP) powder Apply topically 4 (four) times daily. (Patient taking differently: Apply 1 g topically 4 (four) times daily as needed (rash). ) 180 g 2  . Omega-3 Fatty Acids (FISH OIL) 1000 MG CAPS Take 2 capsules (2,000 mg total) by mouth 2 (two) times daily. 360 capsule 3  . pantoprazole (PROTONIX) 40 MG tablet Take 1 tablet (40 mg total) by mouth daily. 90 tablet 3  . PARoxetine (PAXIL) 40 MG tablet 40MG  BY MOUTH ONCE DAILY (Patient taking differently: Take 40 mg by mouth daily. ) 90 tablet 3  . TRUEPLUS INSULIN SYRINGE 31G X 5/16" 1 ML MISC USE AS DIRECTED TO INJECT 100 each 2  . losartan (COZAAR) 50 MG tablet Take 1 tablet (50 mg total) by mouth daily. (Patient taking differently: Take 25 mg by mouth daily. ) 90 tablet 3   No current facility-administered medications for this visit.     Past Medical History:  Diagnosis Date  . Atrial fibrillation (HCC)    a. diagnosed in 05/2017 --> started on Eliquis for anticoagulation  . Carotid artery occlusion   . CHF (congestive heart failure) (HCC)   . Depression   . Diabetes mellitus   . Fibromyalgia   . GERD (gastroesophageal reflux disease)   . Hyperlipidemia   . Hypertension   . Stroke (HCC)   . Superficial thrombophlebitis    right leg    Past Surgical History:  Procedure Laterality Date  . CHOLECYSTECTOMY  1991  . HERNIA REPAIR  approx 1969  . NECK SURGERY  1998  . TONSILLECTOMY AND ADENOIDECTOMY  1966    Social History   Socioeconomic History  . Marital status: Married    Spouse name: Not on file  . Number of children: Not on file  . Years of education: Not on file  . Highest education level: Not on file  Occupational History  . Not on file  Social Needs  . Financial resource strain: Not hard at all  . Food insecurity:    Worry: Never  true    Inability: Never true  . Transportation needs:    Medical: No    Non-medical: No  Tobacco Use  . Smoking status: Former Smoker    Types: Cigarettes  . Smokeless tobacco: Never Used  Substance and Sexual Activity  . Alcohol use: No  . Drug use: No  . Sexual activity: Not Currently  Lifestyle  . Physical activity:    Days per week: Not on file    Minutes per session: Not on file  . Stress: Not on file  Relationships  . Social connections:    Talks on phone: Not on file    Gets together: Not on file    Attends religious service: Not on  file    Active member of club or organization: Not on file    Attends meetings of clubs or organizations: Not on file    Relationship status: Not on file  . Intimate partner violence:    Fear of current or ex partner: Not on file    Emotionally abused: Not on file    Physically abused: Not on file    Forced sexual activity: Not on file  Other Topics Concern  . Not on file  Social History Narrative  . Not on file     Vitals:   05/23/18 1500  BP: 118/90  Pulse: 90  SpO2: 95%  Weight: 204 lb (92.5 kg)  Height: 4\' 11"  (1.499 m)    Wt Readings from Last 3 Encounters:  05/23/18 204 lb (92.5 kg)  03/02/18 198 lb (89.8 kg)  03/01/18 202 lb 3.2 oz (91.7 kg)     PHYSICAL EXAM General: NAD HEENT: Normal. Neck: No JVD, no thyromegaly. Lungs: Faint end expiratory wheezes which are intermittent and scattered, no obvious crackles. CV: Regular rate and irregular rhythm, normal S1/S2, no S3, no murmur.  Trace bilateral lower extremity edema.     Abdomen: Soft, nontender, no distention.  Neurologic: Alert and oriented.  Psych: Normal affect. Skin: Normal. Musculoskeletal: No gross deformities.    ECG: Reviewed above under Subjective   Labs: Lab Results  Component Value Date/Time   K 3.4 (L) 03/02/2018 10:51 AM   BUN 18 03/02/2018 10:51 AM   CREATININE 0.92 03/02/2018 10:51 AM   ALT 17 03/02/2018 10:51 AM   TSH 4.142  06/08/2017 08:50 AM   TSH 2.89 04/07/2016 10:52 AM   HGB 12.1 02/28/2018 10:23 AM     Lipids: Lab Results  Component Value Date/Time   LDLCALC 87 10/16/2017 04:11 AM   LDLCALC 121 (H) 08/17/2017 10:22 AM   CHOL 181 10/16/2017 04:11 AM   TRIG 253 (H) 10/16/2017 04:11 AM   HDL 43 10/16/2017 04:11 AM       ASSESSMENT AND PLAN:  1.    Shortness of breath/acute on chronic diastolic heart failure: Currently on Lasix 40 mg twice daily.  I will increase Lasix to 80 mg twice daily for 4 days and increase potassium to 20 mEq twice daily for 4 days.  I will then reduce Lasix to 40 mg twice daily and potassium to 20 mEq daily.  I will check a basic metabolic panel within the next few days.  I will obtain a BNP and a chest x-ray.  2.  Paroxysmal atrial fibrillation: Symptomatically stable.  She appears to be in atrial fibrillation on exam. Systemically anticoagulated with Eliquis 5 mg twice daily.  3.  Chronic hypertension: Blood pressure is reasonably controlled.    I will monitor given increased diuretic requirement.    4.  Hyperlipidemia: Continue Lipitor 80 mg.  LDL 87 on 10/16/2017.    5.  Bilateral carotid artery disease: Doppler reviewed above.  6.  CVA: Continue aspirin and Lipitor.  She has some mild dysarthria.   Disposition: Follow up 6 weeks   Prentice Docker, M.D., F.A.C.C.

## 2018-05-26 ENCOUNTER — Ambulatory Visit (HOSPITAL_COMMUNITY)
Admission: RE | Admit: 2018-05-26 | Discharge: 2018-05-26 | Disposition: A | Discharge status: Home / Self Care | Payer: Medicare HMO | Source: Ambulatory Visit | Attending: Cardiovascular Disease | Admitting: Cardiovascular Disease

## 2018-05-26 ENCOUNTER — Other Ambulatory Visit (HOSPITAL_COMMUNITY)
Admission: RE | Admit: 2018-05-26 | Discharge: 2018-05-26 | Disposition: A | Discharge status: Home / Self Care | Payer: Medicare HMO | Source: Ambulatory Visit | Attending: Cardiovascular Disease | Admitting: Cardiovascular Disease

## 2018-05-26 DIAGNOSIS — I10 Essential (primary) hypertension: Secondary | ICD-10-CM | POA: Diagnosis not present

## 2018-05-26 DIAGNOSIS — R0602 Shortness of breath: Secondary | ICD-10-CM | POA: Diagnosis not present

## 2018-05-26 DIAGNOSIS — I5033 Acute on chronic diastolic (congestive) heart failure: Secondary | ICD-10-CM | POA: Diagnosis not present

## 2018-05-26 LAB — BASIC METABOLIC PANEL
Anion gap: 7 (ref 5–15)
BUN: 18 mg/dL (ref 8–23)
CO2: 30 mmol/L (ref 22–32)
CREATININE: 0.92 mg/dL (ref 0.44–1.00)
Calcium: 9.3 mg/dL (ref 8.9–10.3)
Chloride: 99 mmol/L (ref 98–111)
GFR calc Af Amer: >60 mL/min (ref >60)
GFR calc non Af Amer: >60 mL/min (ref >60)
Glucose, Bld: 338 mg/dL — ABNORMAL HIGH (ref 70–99)
Potassium: 3.5 mmol/L (ref 3.5–5.1)
Sodium: 136 mmol/L (ref 135–145)

## 2018-05-26 LAB — BRAIN NATRIURETIC PEPTIDE: B Natriuretic Peptide: 160 pg/mL — ABNORMAL HIGH (ref 0.0–100.0)

## 2018-05-30 ENCOUNTER — Ambulatory Visit: Payer: Medicare HMO | Admitting: Family Medicine

## 2018-06-26 ENCOUNTER — Ambulatory Visit: Payer: Medicare HMO | Admitting: Cardiovascular Disease

## 2018-06-27 ENCOUNTER — Ambulatory Visit: Payer: Medicare HMO | Admitting: Family Medicine

## 2018-06-28 ENCOUNTER — Encounter: Payer: Self-pay | Admitting: Family Medicine

## 2018-07-04 ENCOUNTER — Ambulatory Visit: Payer: Medicare HMO | Admitting: Student

## 2018-07-04 ENCOUNTER — Encounter (HOSPITAL_COMMUNITY): Payer: Self-pay

## 2018-07-04 ENCOUNTER — Ambulatory Visit (INDEPENDENT_AMBULATORY_CARE_PROVIDER_SITE_OTHER): Payer: Medicare HMO | Admitting: Family Medicine

## 2018-07-04 ENCOUNTER — Ambulatory Visit: Payer: Medicare HMO | Admitting: Cardiovascular Disease

## 2018-07-04 ENCOUNTER — Emergency Department (HOSPITAL_COMMUNITY): Payer: Medicare HMO

## 2018-07-04 ENCOUNTER — Encounter: Payer: Self-pay | Admitting: Family Medicine

## 2018-07-04 ENCOUNTER — Other Ambulatory Visit: Payer: Self-pay

## 2018-07-04 ENCOUNTER — Inpatient Hospital Stay (HOSPITAL_COMMUNITY)
Admission: EM | Admit: 2018-07-04 | Discharge: 2018-07-07 | DRG: 292 | Disposition: A | Discharge status: Home / Self Care | Payer: Medicare HMO | Attending: Cardiovascular Disease | Admitting: Cardiovascular Disease

## 2018-07-04 VITALS — BP 116/82 | HR 112 | Temp 97.9°F | Resp 14 | Ht 59.0 in | Wt 201.0 lb

## 2018-07-04 DIAGNOSIS — I480 Paroxysmal atrial fibrillation: Secondary | ICD-10-CM

## 2018-07-04 DIAGNOSIS — E11649 Type 2 diabetes mellitus with hypoglycemia without coma: Secondary | ICD-10-CM

## 2018-07-04 DIAGNOSIS — I4819 Other persistent atrial fibrillation: Secondary | ICD-10-CM | POA: Diagnosis not present

## 2018-07-04 DIAGNOSIS — K219 Gastro-esophageal reflux disease without esophagitis: Secondary | ICD-10-CM | POA: Diagnosis not present

## 2018-07-04 DIAGNOSIS — R0602 Shortness of breath: Secondary | ICD-10-CM

## 2018-07-04 DIAGNOSIS — Z880 Allergy status to penicillin: Secondary | ICD-10-CM | POA: Diagnosis not present

## 2018-07-04 DIAGNOSIS — Z8249 Family history of ischemic heart disease and other diseases of the circulatory system: Secondary | ICD-10-CM | POA: Diagnosis not present

## 2018-07-04 DIAGNOSIS — I499 Cardiac arrhythmia, unspecified: Secondary | ICD-10-CM | POA: Diagnosis not present

## 2018-07-04 DIAGNOSIS — Z7982 Long term (current) use of aspirin: Secondary | ICD-10-CM | POA: Diagnosis not present

## 2018-07-04 DIAGNOSIS — I361 Nonrheumatic tricuspid (valve) insufficiency: Secondary | ICD-10-CM | POA: Diagnosis not present

## 2018-07-04 DIAGNOSIS — Z823 Family history of stroke: Secondary | ICD-10-CM

## 2018-07-04 DIAGNOSIS — I6523 Occlusion and stenosis of bilateral carotid arteries: Secondary | ICD-10-CM | POA: Diagnosis present

## 2018-07-04 DIAGNOSIS — E119 Type 2 diabetes mellitus without complications: Secondary | ICD-10-CM | POA: Diagnosis present

## 2018-07-04 DIAGNOSIS — I5043 Acute on chronic combined systolic (congestive) and diastolic (congestive) heart failure: Secondary | ICD-10-CM | POA: Diagnosis not present

## 2018-07-04 DIAGNOSIS — R000 Tachycardia, unspecified: Secondary | ICD-10-CM | POA: Diagnosis not present

## 2018-07-04 DIAGNOSIS — I509 Heart failure, unspecified: Secondary | ICD-10-CM

## 2018-07-04 DIAGNOSIS — Z8349 Family history of other endocrine, nutritional and metabolic diseases: Secondary | ICD-10-CM | POA: Diagnosis not present

## 2018-07-04 DIAGNOSIS — E785 Hyperlipidemia, unspecified: Secondary | ICD-10-CM | POA: Diagnosis present

## 2018-07-04 DIAGNOSIS — M797 Fibromyalgia: Secondary | ICD-10-CM | POA: Diagnosis not present

## 2018-07-04 DIAGNOSIS — I10 Essential (primary) hypertension: Secondary | ICD-10-CM | POA: Diagnosis present

## 2018-07-04 DIAGNOSIS — R8290 Unspecified abnormal findings in urine: Secondary | ICD-10-CM | POA: Diagnosis not present

## 2018-07-04 DIAGNOSIS — IMO0002 Reserved for concepts with insufficient information to code with codable children: Secondary | ICD-10-CM | POA: Diagnosis present

## 2018-07-04 DIAGNOSIS — Z882 Allergy status to sulfonamides status: Secondary | ICD-10-CM

## 2018-07-04 DIAGNOSIS — F32A Depression, unspecified: Secondary | ICD-10-CM | POA: Diagnosis present

## 2018-07-04 DIAGNOSIS — E1165 Type 2 diabetes mellitus with hyperglycemia: Secondary | ICD-10-CM | POA: Diagnosis present

## 2018-07-04 DIAGNOSIS — I340 Nonrheumatic mitral (valve) insufficiency: Secondary | ICD-10-CM | POA: Diagnosis not present

## 2018-07-04 DIAGNOSIS — Z87891 Personal history of nicotine dependence: Secondary | ICD-10-CM | POA: Diagnosis not present

## 2018-07-04 DIAGNOSIS — Z8673 Personal history of transient ischemic attack (TIA), and cerebral infarction without residual deficits: Secondary | ICD-10-CM

## 2018-07-04 DIAGNOSIS — I081 Rheumatic disorders of both mitral and tricuspid valves: Secondary | ICD-10-CM | POA: Diagnosis present

## 2018-07-04 DIAGNOSIS — I4891 Unspecified atrial fibrillation: Secondary | ICD-10-CM | POA: Diagnosis not present

## 2018-07-04 DIAGNOSIS — I110 Hypertensive heart disease with heart failure: Principal | ICD-10-CM | POA: Diagnosis present

## 2018-07-04 DIAGNOSIS — Z7901 Long term (current) use of anticoagulants: Secondary | ICD-10-CM | POA: Diagnosis not present

## 2018-07-04 DIAGNOSIS — E781 Pure hyperglyceridemia: Secondary | ICD-10-CM | POA: Diagnosis present

## 2018-07-04 DIAGNOSIS — Z888 Allergy status to other drugs, medicaments and biological substances status: Secondary | ICD-10-CM

## 2018-07-04 DIAGNOSIS — I5033 Acute on chronic diastolic (congestive) heart failure: Secondary | ICD-10-CM | POA: Diagnosis present

## 2018-07-04 DIAGNOSIS — Z833 Family history of diabetes mellitus: Secondary | ICD-10-CM | POA: Diagnosis not present

## 2018-07-04 DIAGNOSIS — Z794 Long term (current) use of insulin: Secondary | ICD-10-CM | POA: Diagnosis not present

## 2018-07-04 DIAGNOSIS — F329 Major depressive disorder, single episode, unspecified: Secondary | ICD-10-CM | POA: Diagnosis present

## 2018-07-04 DIAGNOSIS — Z6841 Body Mass Index (BMI) 40.0 and over, adult: Secondary | ICD-10-CM | POA: Diagnosis not present

## 2018-07-04 DIAGNOSIS — R079 Chest pain, unspecified: Secondary | ICD-10-CM | POA: Diagnosis not present

## 2018-07-04 DIAGNOSIS — I472 Ventricular tachycardia: Secondary | ICD-10-CM | POA: Diagnosis not present

## 2018-07-04 DIAGNOSIS — G4733 Obstructive sleep apnea (adult) (pediatric): Secondary | ICD-10-CM | POA: Diagnosis present

## 2018-07-04 LAB — CBG MONITORING, ED: Glucose-Capillary: 243 mg/dL — ABNORMAL HIGH (ref 70–99)

## 2018-07-04 LAB — URINALYSIS, ROUTINE W REFLEX MICROSCOPIC
BILIRUBIN URINE: NEGATIVE
Bacteria, UA: NONE SEEN /HPF
Ketones, ur: NEGATIVE
Leukocytes,Ua: NEGATIVE
Nitrite: NEGATIVE
Specific Gravity, Urine: 1.025 (ref 1.001–1.03)
WBC, UA: NONE SEEN /HPF (ref 0–5)
pH: 5.5 (ref 5.0–8.0)

## 2018-07-04 LAB — CBC WITH DIFFERENTIAL/PLATELET
Abs Immature Granulocytes: 0.01 10*3/uL (ref 0.00–0.07)
Basophils Absolute: 0.1 10*3/uL (ref 0.0–0.1)
Basophils Relative: 1 %
Eosinophils Absolute: 0.1 10*3/uL (ref 0.0–0.5)
Eosinophils Relative: 2 %
HEMATOCRIT: 40.2 % (ref 36.0–46.0)
Hemoglobin: 12.8 g/dL (ref 12.0–15.0)
IMMATURE GRANULOCYTES: 0 %
Lymphocytes Relative: 40 %
Lymphs Abs: 3.1 10*3/uL (ref 0.7–4.0)
MCH: 30 pg (ref 26.0–34.0)
MCHC: 31.8 g/dL (ref 30.0–36.0)
MCV: 94.1 fL (ref 80.0–100.0)
Monocytes Absolute: 0.6 10*3/uL (ref 0.1–1.0)
Monocytes Relative: 8 %
Neutro Abs: 3.8 10*3/uL (ref 1.7–7.7)
Neutrophils Relative %: 49 %
Platelets: 188 10*3/uL (ref 150–400)
RBC: 4.27 MIL/uL (ref 3.87–5.11)
RDW: 13.7 % (ref 11.5–15.5)
WBC: 7.7 10*3/uL (ref 4.0–10.5)
nRBC: 0 % (ref 0.0–0.2)

## 2018-07-04 LAB — BASIC METABOLIC PANEL
Anion gap: 12 (ref 5–15)
BUN: 12 mg/dL (ref 8–23)
CO2: 24 mmol/L (ref 22–32)
Calcium: 8.8 mg/dL — ABNORMAL LOW (ref 8.9–10.3)
Chloride: 102 mmol/L (ref 98–111)
Creatinine, Ser: 0.9 mg/dL (ref 0.44–1.00)
GFR calc Af Amer: >60 mL/min (ref >60)
GFR calc non Af Amer: >60 mL/min (ref >60)
GLUCOSE: 270 mg/dL — AB (ref 70–99)
POTASSIUM: 3.9 mmol/L (ref 3.5–5.1)
Sodium: 138 mmol/L (ref 135–145)

## 2018-07-04 LAB — I-STAT TROPONIN, ED: Troponin i, poc: 0.03 ng/mL (ref 0.00–0.08)

## 2018-07-04 LAB — MICROSCOPIC MESSAGE

## 2018-07-04 LAB — BRAIN NATRIURETIC PEPTIDE: B Natriuretic Peptide: 386.9 pg/mL — ABNORMAL HIGH (ref 0.0–100.0)

## 2018-07-04 MED ORDER — APIXABAN 5 MG PO TABS
5.0000 mg | ORAL_TABLET | Freq: Two times a day (BID) | ORAL | Status: DC
  Administered 2018-07-05 – 2018-07-07 (×6): 5 mg via ORAL
  Filled 2018-07-04 (×7): qty 1

## 2018-07-04 MED ORDER — PANTOPRAZOLE SODIUM 40 MG PO TBEC
40.0000 mg | DELAYED_RELEASE_TABLET | Freq: Every day | ORAL | Status: DC
  Administered 2018-07-05 – 2018-07-07 (×3): 40 mg via ORAL
  Filled 2018-07-04 (×3): qty 1

## 2018-07-04 MED ORDER — ASPIRIN EC 81 MG PO TBEC
81.0000 mg | DELAYED_RELEASE_TABLET | Freq: Every day | ORAL | Status: DC
  Administered 2018-07-05 – 2018-07-07 (×3): 81 mg via ORAL
  Filled 2018-07-04 (×3): qty 1

## 2018-07-04 MED ORDER — FENOFIBRATE 54 MG PO TABS
54.0000 mg | ORAL_TABLET | Freq: Every day | ORAL | Status: DC
  Administered 2018-07-05 – 2018-07-07 (×3): 54 mg via ORAL
  Filled 2018-07-04 (×3): qty 1

## 2018-07-04 MED ORDER — FUROSEMIDE 10 MG/ML IJ SOLN
40.0000 mg | Freq: Once | INTRAMUSCULAR | Status: AC
  Administered 2018-07-04: 40 mg via INTRAVENOUS
  Filled 2018-07-04: qty 4

## 2018-07-04 MED ORDER — GABAPENTIN 100 MG PO CAPS
100.0000 mg | ORAL_CAPSULE | Freq: Every day | ORAL | Status: DC
  Administered 2018-07-05 – 2018-07-06 (×3): 100 mg via ORAL
  Filled 2018-07-04 (×3): qty 1

## 2018-07-04 NOTE — Assessment & Plan Note (Signed)
Uncontrolled, CBG fasting 240 this AM 3+ glucose in urine Urine sent for culture

## 2018-07-04 NOTE — Patient Instructions (Signed)
[facility] For Atrial fibrillation and CHF

## 2018-07-04 NOTE — Progress Notes (Signed)
Subjective:    Patient ID: Tina [name], female    DOB: 03/17/1951, 68 y.o.   MRN: [id number]  Patient presents for SOB (feels like she is smothering) and Malodorous Urine (x1 week- terrible odor to urine) Pt here with shortness of breath for the past 2 weeks which is been worsening.  She has known paroxysmal atrial fibrillation as well as congestive heart failure diastolic.  She is currently on Lasix 40 mg twice a day.  She is also on potassium.  She also noted that her urine had an odor and was dark. She is on eliquis/ASA  for anti-coagulation, has history of stroke as well She does not know what her dry weight is  Diabetes mellitus last A1c is 8.5% in October she is followed by Dr. Everardo All he is on very high doses of Basaglar 110 units  Note she cancelled appt with me and cardiology last week, and there is a cancellation for cardiology for today to for her breathing? She states she has been stressed,her daughter took her car recently   Deeann Dowse  [phone]  Review Of Systems:  GEN- denies fatigue, fever, weight loss,+weakness, recent illness HEENT- denies eye drainage, change in vision, nasal discharge, CVS- denies chest pain, palpitations RESP- + SOB, cough, wheeze ABD- denies N/V, change in stools, abd pain GU- denies dysuria, hematuria, dribbling, incontinence MSK- denies joint pain, muscle aches, injury Neuro- denies headache, dizziness, syncope, seizure activity       Objective:    BP 116/82   Pulse (!) 112   Temp 97.9 F (36.6 C) (Oral)   Resp 14   Ht 4\' 11"  (1.499 m)   Wt 201 lb (91.2 kg)   SpO2 96%   BMI 40.60 kg/m  GEN- NAD, alert and oriented x3 HEENT- PERRL, EOMI, non injected sclera, pink conjunctiva, MMM, oropharynx clear Neck- Supple, no thyromegaly CVS- irregular rhythem and rate , no murmur RESP-mild crackles left base, increased WOB at rest, difficulty getting a sentence out  ABD-NABS,soft,NT,ND EXT- No edema Pulses- Radial, DP-  2+   EKG- A fib, HR 110      Assessment & Plan:      Problem List Items Addressed This Visit      Unprioritized   Diabetes mellitus type II, uncontrolled (HCC)    Uncontrolled, CBG fasting 240 this AM 3+ glucose in urine Urine sent for culture      PAF (paroxysmal atrial fibrillation) (HCC)   Relevant Orders   EKG 12-Lead (Completed)    Other Visit Diagnoses    Acute on chronic combined systolic and diastolic heart failure (HCC)    -  Primary   Worsening CHF in setting of A fib with RVR, HR up to 130's with ambulation, sat down to 93%. She is on lasix 40mg  twice a day, no current rate controlling medication Weight up 3lbs from our scales But she is not safe to treat at home, lives alone unable to do her ADLS without difficulty  I did speak to daughter Ms. Debbrah Alar she will meet patient at hte hospital Placed on 1L for comfort  EMS called to transport to ER for evaluation.    Atrial fibrillation with RVR (HCC)       Malodorous urine       Relevant Orders   Urinalysis, Routine w reflex microscopic   Urine Culture      Note: This dictation was prepared with Dragon dictation along with smaller phrase technology. Any transcriptional errors that result from  this process are unintentional.

## 2018-07-04 NOTE — ED Provider Notes (Addendum)
[facility] EMERGENCY DEPARTMENT Provider Note   CSN: [id number] Arrival date & time: 07/04/18  1626     History   Chief Complaint Chief Complaint  Patient presents with  . Shortness of Breath    HPI Tina [name] is a 68 y.o. female.  HPI  Patient is a 68 year old female with a history of heart failure with preserved ejection fraction, atrial fibrillation on Eliquis, hypertension, hyperlipidemia, CVA presenting for shortness of breath.  Patient was at her primary care provider's office and EMS was called.  Patient reports that she has been progressively short of breath for several weeks, and cannot identify when it initially started.  She does report that today she was reaching a point of inability to tolerate any further.  In particular, she reports that she cannot sleep at night due to her breathing.  She reports orthopnea and cannot remember the last time that she was able to lie flat.  Patient reports wheezing with cough but otherwise she is not wheezing.  Patient denies any current chest pain.  No diaphoresis, syncope or presyncope.  Denies productive cough.  Denies any recent fevers or chills, or upper respiratory infections.  Patient does report that she has chronic lower leg edema that appears slightly worse over the past week.  Patient reports that she has had titrations of her Lasix per her cardiologist, and is now currently taking 80 mg once daily.  She does report that she sometimes reduces her dose because she has to go to the bathroom so much become so short of breath going to the bathroom.  When asked about atrial fibrillation, patient is unsure if she stays in atrial fibrillation all the time.  Patient reports full compliance with Eliquis and no missed doses.   Past Medical History:  Diagnosis Date  . Atrial fibrillation (HCC)    a. diagnosed in 05/2017 --> started on Eliquis for anticoagulation  . Carotid artery occlusion   . CHF (congestive heart  failure) (HCC)   . Depression   . Diabetes mellitus   . Fibromyalgia   . GERD (gastroesophageal reflux disease)   . Hyperlipidemia   . Hypertension   . Stroke (HCC)   . Superficial thrombophlebitis    right leg    Patient Active Problem List   Diagnosis Date Noted  . History of cerebrovascular accident (CVA) with residual deficit 10/28/2017  . Acute thalamic infarction (HCC)   . Left sided numbness 10/14/2017  . PAF (paroxysmal atrial fibrillation) (HCC) 05/24/2017  . Hypokalemia 05/24/2017  . 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 Seasonal allergies 09/22/2015  . 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 Yeast infection 11/12/2014  . Skin infection 11/12/2014  . Carotid artery stenosis 08/09/2014  . Bladder prolapse, female, acquired 07/15/2014  . Urinary incontinence 07/15/2014  . Bradycardia 04/03/2014  . Bunion of great toe 05/11/2013  . Anxiety state, unspecified 09/17/2012  . Pain in joint, lower leg 09/17/2012  . Obstructive sleep apnea 07/21/2012  . SOB (shortness of breath) 12/06/2011  . Leg edema 12/06/2011  . CHF (congestive heart failure) (HCC) 12/06/2011  . GERD (gastroesophageal reflux disease)   . Vertigo 12/01/2011  . Fall 12/01/2011  . Depression 11/08/2011  . Insomnia 11/08/2011  . Obesity 11/08/2011  . Diabetes mellitus type II, uncontrolled (HCC) 03/03/2007  . Hyperlipidemia 03/03/2007  . Essential hypertension 03/03/2007  . Fibromyalgia 03/03/2007    Past Surgical History:  Procedure Laterality Date  . CHOLECYSTECTOMY  1991  .  HERNIA REPAIR  approx 1969  . NECK SURGERY  1998  . TONSILLECTOMY AND ADENOIDECTOMY  1966     OB History   No obstetric history on file.      Home Medications    Prior to Admission medications   Medication Sig Start Date End Date Taking? Authorizing Provider  albuterol (PROVENTIL HFA;VENTOLIN HFA) 108 (90 Base) MCG/ACT inhaler Inhale 2 puffs into the lungs every 6 (six) hours as needed for  wheezing or shortness of breath. 05/23/17   Salley Scarlet, MD  apixaban (ELIQUIS) 5 MG TABS tablet Take 1 tablet (5 mg total) by mouth 2 (two) times daily. 12/19/17   Salley Scarlet, MD  aspirin EC 81 MG EC tablet Take 1 tablet (81 mg total) by mouth daily. 10/16/17   Rai, Delene Ruffini, MD  benzonatate (TESSALON) 100 MG capsule Take 1 capsule (100 mg total) by mouth 3 (three) times daily as needed for cough. 07/20/17   Salley Scarlet, MD  clotrimazole-betamethasone (LOTRISONE) cream Apply 1 application topically 2 (two) times daily. Patient taking differently: Apply 1 application topically daily as needed (itching).  12/15/16   Salley Scarlet, MD  fenofibrate 54 MG tablet Take 1 tablet (54 mg total) by mouth daily. 12/19/17   Salley Scarlet, MD  furosemide (LASIX) 40 MG tablet Take 1 tablet (40 mg total) by mouth 2 (two) times daily. 05/23/18   Laqueta Linden, MD  gabapentin (NEURONTIN) 100 MG capsule Take 1 capsule (100 mg total) by mouth at bedtime. 10/28/17   [place], Velna Hatchet, MD  HYDROcodone-acetaminophen (NORCO) 5-325 MG tablet Take 1 tablet by mouth 2 (two) times daily as needed for moderate pain. 05/05/18   Salley Scarlet, MD  Insulin Glargine (BASAGLAR KWIKPEN) 100 UNIT/ML SOPN Inject 1.1 mLs (110 Units total) into the skin every morning. 03/27/18   Romero Belling, MD  ipratropium (ATROVENT) 0.02 % nebulizer solution Take 2.5 mLs (0.5 mg total) by nebulization every 4 (four) hours as needed for wheezing or shortness of breath. 05/27/17   Salley Scarlet, MD  ipratropium (ATROVENT) 0.06 % nasal spray Place 2 sprays into both nostrils 4 (four) times daily as needed for rhinitis. 10/05/16   [place], Velna Hatchet, MD  losartan (COZAAR) 50 MG tablet Take 1 tablet (50 mg total) by mouth daily. Patient taking differently: Take 25 mg by mouth daily.  06/20/17 11/30/17  Strader, Lennart Pall, PA-C  montelukast (SINGULAIR) 10 MG tablet 10MG  BY MOUTH ONCE DAILY Patient taking differently: Take 10  mg by mouth daily.  08/03/17   Salley Scarlet, MD  nystatin (MYCOSTATIN/NYSTOP) powder Apply topically 4 (four) times daily. Patient taking differently: Apply 1 g topically 4 (four) times daily as needed (rash).  12/15/16   [place], Velna Hatchet, MD  Omega-3 Fatty Acids (FISH OIL) 1000 MG CAPS Take 2 capsules (2,000 mg total) by mouth 2 (two) times daily. 04/21/17   Salley Scarlet, MD  pantoprazole (PROTONIX) 40 MG tablet Take 1 tablet (40 mg total) by mouth daily. 08/03/17   [place], Velna Hatchet, MD  PARoxetine (PAXIL) 40 MG tablet 40MG  BY MOUTH ONCE DAILY Patient taking differently: Take 40 mg by mouth daily.  08/03/17   [place], Velna Hatchet, MD  potassium chloride SA (K-DUR,KLOR-CON) 20 MEQ tablet Take 1 tablet (20 mEq total) by mouth daily. 05/23/18   Laqueta Linden, MD  TRUEPLUS INSULIN SYRINGE 31G X 5/16" 1 ML MISC USE AS DIRECTED TO INJECT 12/23/14   Salley Scarlet,  MD    Family History Family History  Problem Relation Age of Onset  . Hypertension Mother   . Hyperlipidemia Mother   . Heart disease Mother   . Stroke Sister   . Hypertension Sister   . Hyperlipidemia Sister   . Heart disease Sister   . Diabetes Maternal Grandmother   . Diabetes Daughter     Social History Social History   Tobacco Use  . Smoking status: Former Smoker    Types: Cigarettes  . Smokeless tobacco: Never Used  Substance Use Topics  . Alcohol use: No  . Drug use: No     Allergies   Cefuroxime axetil; Fentanyl and related; Niaspan [niacin er]; Sulfa antibiotics; Vasotec [enalapril]; Welchol [colesevelam hcl]; Lyrica [pregabalin]; and Penicillins   Review of Systems Review of Systems  Constitutional: Negative for chills and fever.  HENT: Negative for congestion and sore throat.   Eyes: Negative for visual disturbance.  Respiratory: Positive for shortness of breath. Negative for cough and chest tightness.   Cardiovascular: Positive for palpitations and leg swelling. Negative for chest pain.    Gastrointestinal: Negative for abdominal pain, nausea and vomiting.  Genitourinary: Negative for dysuria and flank pain.  Musculoskeletal: Negative for back pain and myalgias.  Skin: Negative for rash.  Neurological: Negative for dizziness, syncope, light-headedness and headaches.     Physical Exam Updated Vital Signs BP (!) 158/118 (BP Location: Right Arm)   Pulse (!) 110 Comment: irregular  Temp 97.9 F (36.6 C) (Oral)   Resp 20   Ht 4\' 11"  (1.499 m)   Wt 91.2 kg   SpO2 95%   BMI 40.60 kg/m   Physical Exam Vitals signs and nursing note reviewed.  Constitutional:      General: She is not in acute distress.    Appearance: She is well-developed.  HENT:     Head: Normocephalic and atraumatic.  Eyes:     Conjunctiva/sclera: Conjunctivae normal.     Pupils: Pupils are equal, round, and reactive to light.  Neck:     Musculoskeletal: Normal range of motion and neck supple.  Cardiovascular:     Rate and Rhythm: Tachycardia present. Rhythm regularly irregular.     Pulses:          Dorsalis pedis pulses are 2+ on the right side and 2+ on the left side.       Posterior tibial pulses are 2+ on the right side and 2+ on the left side.     Heart sounds: S1 normal and S2 normal. No murmur.  Pulmonary:     Effort: Pulmonary effort is normal.     Breath sounds: Examination of the right-middle field reveals rales. Examination of the left-middle field reveals rales. Examination of the right-lower field reveals rales. Examination of the left-lower field reveals rales. Rales present. No wheezing.     Comments: Patient has 2+ lower extremity pitting edema to the level of mid shin. Abdominal:     General: There is no distension.     Palpations: Abdomen is soft.     Tenderness: There is no abdominal tenderness. There is no guarding.  Musculoskeletal: Normal range of motion.        General: No deformity.  Lymphadenopathy:     Cervical: No cervical adenopathy.  Skin:    General: Skin is  warm and dry.     Findings: No erythema or rash.  Neurological:     Mental Status: She is alert.     Comments: Cranial nerves  grossly intact. Patient moves extremities symmetrically and with good coordination.  Psychiatric:        Behavior: Behavior normal.        Thought Content: Thought content normal.        Judgment: Judgment normal.      ED Treatments / Results  Labs (all labs ordered are listed, but only abnormal results are displayed) Labs Reviewed  BASIC METABOLIC PANEL - Abnormal; Notable for the following components:      Result Value   Glucose, Bld 270 (*)    Calcium 8.8 (*)    All other components within normal limits  BRAIN NATRIURETIC PEPTIDE - Abnormal; Notable for the following components:   B Natriuretic Peptide 386.9 (*)    All other components within normal limits  CBG MONITORING, ED - Abnormal; Notable for the following components:   Glucose-Capillary 243 (*)    All other components within normal limits  CBC WITH DIFFERENTIAL/PLATELET  I-STAT TROPONIN, ED    EKG EKG Interpretation  Date/Time:  Tuesday July 04 2018 16:42:14 EST Ventricular Rate:  103 PR Interval:    QRS Duration: 92 QT Interval:  371 QTC Calculation: 486 R Axis:   75 Text Interpretation:  Atrial fibrillation Paired ventricular premature complexes Borderline prolonged QT interval No STEMI.  Confirmed by Alona Bene [phone]) on 07/04/2018 4:53:05 PM   Radiology Dg Chest Port 1 View  Result Date: 07/04/2018 CLINICAL DATA:  Persistent shortness of breath. History of congestive heart failure and atrial fibrillation. EXAM: PORTABLE CHEST 1 VIEW COMPARISON:  05/26/2018 FINDINGS: Cardiomegaly. Pulmonary venous hypertension. Mild interstitial edema but no alveolar edema, infiltrate, collapse or effusion. IMPRESSION: Consistent with mild congestive heart failure. Cardiomegaly. Venous hypertension. Mild interstitial edema. Electronically Signed   By: Paulina Fusi M.D.   On: 07/04/2018 17:15     Procedures Procedures (including critical care time)  Medications Ordered in ED Medications  furosemide (LASIX) injection 40 mg (has no administration in time range)     Initial Impression / Assessment and Plan / ED Course  I have reviewed the triage vital signs and the nursing notes.  Pertinent labs & imaging results that were available during my care of the patient were reviewed by me and considered in my medical decision making (see chart for details).  Clinical Course as of Jul 05 2011  Tue Jul 04, 2018  2007 Spoke with Dr. Deforest Hoyles of cardiology who will come to evaluate patient. Appreciate his involvement.    [AM]    Clinical Course User Index [AM] Aviva Kluver B, PA-C    Differential diagnosis includes HF exacerbation, reactive airway disease, COPD exacerbation, pneumonia, PE, ACS, cardiac tamponade.  Patient is nontoxic-appearing, afebrile, tachycardic in the 90s to low 100s and hypertensive on arrival.  Greatest blood pressure 158/118.  Patient is in atrial fibrillation.  Although rate is greater than 100, patient is wavering between the 90s to low 100s.  I feel that this rate is more compensatory rather than indicative of A. fib with RVR.  According to patient's last cardiology note in late December 2019 she was in atrial fibrillation at that time.  Patient is currently anticoagulated.  She is not on any rate controlling medications.  Do not suspect pulmonary embolism given that patient is anticoagulated on Eliquis and denies any missed doses.  Patient has no history of DVT/PE, and has had no recent hospitalizations or surgery.  Do not suspect ACS as patient has had persistent symptoms for several weeks now, worsening this week,  and troponin is negative.  EKG demonstrating atrial fibrillation with rate of 103.  No ischemic findings on EKG.  Patient has no leukocytosis.  Normal renal function.  Troponin is negative.  BNP is elevated at 386.9.  May be falsely low given  obesity.  Chest x-ray demonstrates pulmonary vascular congestion.  Will discuss with cardiology for admission.  Lasix 40 mg ordered for diuresis.  Patient be vented for further work-up and management.  This is a shared visit with Dr. Alona BeneJoshua Long. Patient was independently evaluated by this attending physician. Attending physician consulted in evaluation and admission management.  Final Clinical Impressions(s) / ED Diagnoses   Final diagnoses:  Acute on chronic congestive heart failure, unspecified heart failure type [facility](HCC)  Atrial fibrillation, unspecified type [facility](HCC)  Shortness of breath    ED Discharge Orders    None       Delia ChimesMurray, Ruxin Ransome B, PA-C 07/04/18 2014    Delia ChimesMurray, Timo Hartwig B, PA-C 07/04/18 2014    Maia PlanLong, Joshua G, MD 07/05/18 0020

## 2018-07-04 NOTE — ED Triage Notes (Signed)
[facility] reports pt sent from [facility]. Hx chf. Increased DOE and fluid retention over last 3-4 mths. Taking lasix and K+. Last night increased SOB and bilat edema. Hx afib with RVR. Given 125mg  solumedrol.

## 2018-07-04 NOTE — H&P (Signed)
Cardiology Admission History and Physical:   Patient ID: Tina [name]; MRN: [id number]; DOB: 09/04/1950   Admission date: 07/04/2018  Primary Care Provider: Salley Scarleturham, Kawanta F, MD Primary Cardiologist: Prentice DockerSuresh Koneswaran, MD   Chief Complaint:  Dyspnea on exertion  History of Present Illness:   Tina [name] is a 68 y.o. female with a history of HFpEF, atrial fibrillation on chronic anticoagulation with Eliquis, hypertension, hyperlipidemia, depression, fibromyalgia, GERD and CVA (right thalamic punctate infarct in 2019) who presents with progressively worsening shortness of breath. For the past week or so she has noticed more dyspnea on exertion. She is also endorsing orthopnea and has had difficulty sleeping at night. She states compliance with her medications. She denies any dietary indiscretions.  Since January of this year she has not been feeling well.  Her oral Lasix dose was doubled recently along with her potassium pills.  She feels that the diuretic is not as effective now.  Over the past day or so she has noticed more leg swelling.  She is also having resting shortness of breath.  She denies any chest pain, presyncope or syncope.  Earlier today she was seen in the family medicine clinic where her exam revealed bilateral crackles on lung auscultation.  Her heart rate was elevated into the 130s and her rhythm was atrial fibrillation.  It was therefore recommended that she get admitted to the hospital for aggressive diuresis.   Previous Cardiac Work-up Echocardiogram on 10/16/2017 showed normal left ventricular systolic function, LVEF 60 to 40%65%, moderate LVH, and mild mitral and tricuspid regurgitation. There is moderate left atrial dilatation.  Carotid Doppler showed 40 to 59% right internal carotid artery stenosis with 1 to 39% left internal carotid artery stenosis.   Past Medical History:  Diagnosis Date  . Atrial fibrillation (HCC)    a. diagnosed in 05/2017 --> started  on Eliquis for anticoagulation  . Carotid artery occlusion   . CHF (congestive heart failure) (HCC)   . Depression   . Diabetes mellitus   . Fibromyalgia   . GERD (gastroesophageal reflux disease)   . Hyperlipidemia   . Hypertension   . Stroke (HCC)   . Superficial thrombophlebitis    right leg    Past Surgical History:  Procedure Laterality Date  . CHOLECYSTECTOMY  1991  . HERNIA REPAIR  approx 1969  . NECK SURGERY  1998  . TONSILLECTOMY AND ADENOIDECTOMY  1966     Medications Prior to Admission: Prior to Admission medications   Medication Sig Start Date End Date Taking? Authorizing Provider  albuterol (PROVENTIL HFA;VENTOLIN HFA) 108 (90 Base) MCG/ACT inhaler Inhale 2 puffs into the lungs every 6 (six) hours as needed for wheezing or shortness of breath. 05/23/17   Salley Scarleturham, Kawanta F, MD  apixaban (ELIQUIS) 5 MG TABS tablet Take 1 tablet (5 mg total) by mouth 2 (two) times daily. 12/19/17   Salley Scarleturham, Kawanta F, MD  aspirin EC 81 MG EC tablet Take 1 tablet (81 mg total) by mouth daily. 10/16/17   Rai, Delene Ruffiniipudeep K, MD  benzonatate (TESSALON) 100 MG capsule Take 1 capsule (100 mg total) by mouth 3 (three) times daily as needed for cough. 07/20/17   Salley Scarleturham, Kawanta F, MD  clotrimazole-betamethasone (LOTRISONE) cream Apply 1 application topically 2 (two) times daily. Patient taking differently: Apply 1 application topically daily as needed (itching).  12/15/16   Salley Scarleturham, Kawanta F, MD  fenofibrate 54 MG tablet Take 1 tablet (54 mg total) by mouth daily. 12/19/17   Milinda Antisurham, Kawanta  F, MD  furosemide (LASIX) 40 MG tablet Take 1 tablet (40 mg total) by mouth 2 (two) times daily. 05/23/18   Laqueta LindenKoneswaran, Suresh A, MD  gabapentin (NEURONTIN) 100 MG capsule Take 1 capsule (100 mg total) by mouth at bedtime. 10/28/17   [place], Velna HatchetKawanta F, MD  HYDROcodone-acetaminophen (NORCO) 5-325 MG tablet Take 1 tablet by mouth 2 (two) times daily as needed for moderate pain. 05/05/18   Salley Scarleturham, Kawanta F, MD  Insulin  Glargine (BASAGLAR KWIKPEN) 100 UNIT/ML SOPN Inject 1.1 mLs (110 Units total) into the skin every morning. 03/27/18   Romero BellingEllison, Sean, MD  ipratropium (ATROVENT) 0.02 % nebulizer solution Take 2.5 mLs (0.5 mg total) by nebulization every 4 (four) hours as needed for wheezing or shortness of breath. 05/27/17   Salley Scarleturham, Kawanta F, MD  ipratropium (ATROVENT) 0.06 % nasal spray Place 2 sprays into both nostrils 4 (four) times daily as needed for rhinitis. 10/05/16   [place], Velna HatchetKawanta F, MD  losartan (COZAAR) 50 MG tablet Take 1 tablet (50 mg total) by mouth daily. Patient taking differently: Take 25 mg by mouth daily.  06/20/17 11/30/17  Strader, Lennart PallBrittany M, PA-C  montelukast (SINGULAIR) 10 MG tablet 10MG  BY MOUTH ONCE DAILY Patient taking differently: Take 10 mg by mouth daily.  08/03/17   Salley Scarleturham, Kawanta F, MD  nystatin (MYCOSTATIN/NYSTOP) powder Apply topically 4 (four) times daily. Patient taking differently: Apply 1 g topically 4 (four) times daily as needed (rash).  12/15/16   [place], Velna HatchetKawanta F, MD  Omega-3 Fatty Acids (FISH OIL) 1000 MG CAPS Take 2 capsules (2,000 mg total) by mouth 2 (two) times daily. 04/21/17   Salley Scarleturham, Kawanta F, MD  pantoprazole (PROTONIX) 40 MG tablet Take 1 tablet (40 mg total) by mouth daily. 08/03/17   [place], Velna HatchetKawanta F, MD  PARoxetine (PAXIL) 40 MG tablet 40MG  BY MOUTH ONCE DAILY Patient taking differently: Take 40 mg by mouth daily.  08/03/17   [place], Velna HatchetKawanta F, MD  potassium chloride SA (K-DUR,KLOR-CON) 20 MEQ tablet Take 1 tablet (20 mEq total) by mouth daily. 05/23/18   Laqueta LindenKoneswaran, Suresh A, MD  TRUEPLUS INSULIN SYRINGE 31G X 5/16" 1 ML MISC USE AS DIRECTED TO INJECT Patient taking differently: as directed.  12/23/14   Salley Scarleturham, Kawanta F, MD     Allergies:    Allergies  Allergen Reactions  . Cefuroxime Axetil Other (See Comments)    REACTION: unspecified  . Fentanyl And Related Other (See Comments)    Patch: unknown reaction  . Niaspan [Niacin Er] Other (See Comments)     Burning all over  . Sulfa Antibiotics Nausea And Vomiting  . Vasotec [Enalapril] Cough  . Welchol [Colesevelam Hcl] Other (See Comments)    Muscle aches and joint pains  . Lyrica [Pregabalin] Rash    Blistering rash around same time as starting drug  . Penicillins Hives and Rash    Has patient had a PCN reaction causing immediate rash, facial/tongue/throat swelling, SOB or lightheadedness with hypotension: Yes Has patient had a PCN reaction causing severe rash involving mucus membranes or skin necrosis: Yes Has patient had a PCN reaction that required hospitalization: No Has patient had a PCN reaction occurring within the last 10 years: No If all of the above answers are "NO", then may proceed with Cephalosporin use.     Social History:   Social History   Socioeconomic History  . Marital status: Married    Spouse name: Not on file  . Number of children: Not on file  .  Years of education: Not on file  . Highest education level: Not on file  Occupational History  . Not on file  Social Needs  . Financial resource strain: Not hard at all  . Food insecurity:    Worry: Never true    Inability: Never true  . Transportation needs:    Medical: No    Non-medical: No  Tobacco Use  . Smoking status: Former Smoker    Types: Cigarettes  . Smokeless tobacco: Never Used  Substance and Sexual Activity  . Alcohol use: No  . Drug use: No  . Sexual activity: Not Currently  Lifestyle  . Physical activity:    Days per week: Not on file    Minutes per session: Not on file  . Stress: Not on file  Relationships  . Social connections:    Talks on phone: Not on file    Gets together: Not on file    Attends religious service: Not on file    Active member of club or organization: Not on file    Attends meetings of clubs or organizations: Not on file    Relationship status: Not on file  . Intimate partner violence:    Fear of current or ex partner: Not on file    Emotionally abused: Not  on file    Physically abused: Not on file    Forced sexual activity: Not on file  Other Topics Concern  . Not on file  Social History Narrative  . Not on file     Family History:  The patient's family history includes Diabetes in her daughter and maternal grandmother; Heart disease in her mother and sister; Hyperlipidemia in her mother and sister; Hypertension in her mother and sister; Stroke in her sister.     Review of Systems: [y] = yes, [ ]  = no   . General: Weight gain [Y ]; Weight loss [ ] ; Anorexia [ ] ; Fatigue [ ] ; Fever [ ] ; Chills [ ] ; Weakness [ ]   . Cardiac: Chest pain/pressure [ ] ; Resting SOB [Y ]; Exertional SOB [Y ]; Orthopnea Gilian.Kraft ]; Pedal Edema [Y ]; Palpitations [Y ]; Syncope [ ] ; Presyncope [ ] ; Paroxysmal nocturnal dyspnea[ ]   . Pulmonary: Cough [ ] ; Wheezing[ ] ; Hemoptysis[ ] ; Sputum [ ] ; Snoring [ ]   . GI: Vomiting[ ] ; Dysphagia[ ] ; Melena[ ] ; Hematochezia [ ] ; Heartburn[ ] ; Abdominal pain [ ] ; Constipation [ ] ; Diarrhea [ ] ; BRBPR [ ]   . GU: Hematuria[ ] ; Dysuria [ ] ; Nocturia[ ]   . Vascular: Pain in legs with walking [ ] ; Pain in feet with lying flat [ ] ; Non-healing sores [ ] ; Stroke [ ] ; TIA [ ] ; Slurred speech [ ] ;  . Neuro: Headaches[ ] ; Vertigo[ ] ; Seizures[ ] ; Paresthesias[ ] ;Blurred vision [ ] ; Diplopia [ ] ; Vision changes [ ]   . Ortho/Skin: Arthritis [ ] ; Joint pain [ ] ; Muscle pain [ ] ; Joint swelling [ ] ; Back Pain [ ] ; Rash [ ]   . Psych: Depression[ ] ; Anxiety[ ]   . Heme: Bleeding problems [ ] ; Clotting disorders [ ] ; Anemia [ ]   . Endocrine: Diabetes [ ] ; Thyroid dysfunction[ ]      Physical Exam/Data:   Vitals:   07/04/18 1642 07/04/18 1643 07/04/18 1835 07/04/18 1916  BP:   (!) 158/118 (!) 160/87  Pulse: (!) 116  (!) 110 (!) 117  Resp: 20  20 19   Temp: 97.9 F (36.6 C)     TempSrc: Oral     SpO2:  95% 96%  Weight:  91.2 kg    Height:  4\' 11"  (1.499 m)      Intake/Output Summary (Last 24 hours) at 07/04/2018 2026 Last data filed at  07/04/2018 2005 Gross per 24 hour  Intake -  Output 550 ml  Net -550 ml   Filed Weights   07/04/18 1643  Weight: 91.2 kg   Body mass index is 40.6 kg/m.  General:  Well nourished, well developed, in no acute distress HEENT: normal Lymph: no adenopathy Neck: no JVD Endocrine:  No thryomegaly Vascular: No carotid bruits; FA pulses 2+ bilaterally without bruits  Cardiac: Irregularly irregular, tachycardiac Lungs: Diffuse crackles bilaterally Abd: soft, nontender, no hepatomegaly  Ext: 2+ lower extremity edema Musculoskeletal:  No deformities, BUE and BLE strength normal and equal Skin: warm and dry  Neuro:  CNs 2-12 intact, no focal abnormalities noted Psych:  Normal affect    EKG:  The ECG that was done today was personally reviewed and demonstrates atrial fibrillation with a ventricular rate of 103 bpm and nonspecific T wave abnormalities   Laboratory Data:  Chemistry Recent Labs  Lab 07/04/18 1722  NA 138  K 3.9  CL 102  CO2 24  GLUCOSE 270*  BUN 12  CREATININE 0.90  CALCIUM 8.8*  GFRNONAA >60  GFRAA >60  ANIONGAP 12    No results for input(s): PROT, ALBUMIN, AST, ALT, ALKPHOS, BILITOT in the last 168 hours. Hematology Recent Labs  Lab 07/04/18 1722  WBC 7.7  RBC 4.27  HGB 12.8  HCT 40.2  MCV 94.1  MCH 30.0  MCHC 31.8  RDW 13.7  PLT 188   Cardiac EnzymesNo results for input(s): TROPONINI in the last 168 hours.  Recent Labs  Lab 07/04/18 1748  TROPIPOC 0.03    BNP Recent Labs  Lab 07/04/18 1720  BNP 386.9*    DDimer No results for input(s): DDIMER in the last 168 hours.  Radiology/Studies:  Dg Chest Port 1 View  Result Date: 07/04/2018 CLINICAL DATA:  Persistent shortness of breath. History of congestive heart failure and atrial fibrillation. EXAM: PORTABLE CHEST 1 VIEW COMPARISON:  05/26/2018 FINDINGS: Cardiomegaly. Pulmonary venous hypertension. Mild interstitial edema but no alveolar edema, infiltrate, collapse or effusion. IMPRESSION:  Consistent with mild congestive heart failure. Cardiomegaly. Venous hypertension. Mild interstitial edema. Electronically Signed   By: Paulina Fusi M.D.   On: 07/04/2018 17:15    Assessment and Plan:   1.  Heart failure with preserved ejection fraction The patient has worsening shortness of breath.  Her chest x-ray reveals pulmonary venous hypertension.  The BNP is 386.  She does have crackles on auscultation. - Lasix 40mg  IV twice daily - Monitor UOP, serum creatinine, GFR - Daily weights - Resume losartan - Consider starting low dose beta-blockers - Maintain K >4.0 and Magnesium >2.0 - Echocardiogram   2.  Atrial fibrillation on chronic anticoagulation with Eliquis The patient's heart rate is ranging between the 110s to the 130s.  This is likely to be compensatory due to her congestive heart failure.  We will try to optimize her fluid status. - Continue Eliquis - Consider starting low-dose beta-blockers   3.  Systemic hypertension Continue losartan    Severity of Illness: The appropriate patient status for this patient is INPATIENT. Inpatient status is judged to be reasonable and necessary in order to provide the required intensity of service to ensure the patient's safety. The patient's presenting symptoms, physical exam findings, and initial radiographic and laboratory data in the context  of their chronic comorbidities is felt to place them at high risk for further clinical deterioration. Furthermore, it is not anticipated that the patient will be medically stable for discharge from the hospital within 2 midnights of admission. The following factors support the patient status of inpatient.   " The patient's presenting symptoms include SOB. " The worrisome physical exam findings include abnormal lung auscultation. " The initial radiographic and laboratory data are worrisome because of elevated BNP. " The chronic co-morbidities include AFIB, hypertension and hyperlipidemia.   *  I certify that at the point of admission it is my clinical judgment that the patient will require inpatient hospital care spanning beyond 2 midnights from the point of admission due to high intensity of service, high risk for further deterioration and high frequency of surveillance required.*    For questions or updates, please contact [facility] Please consult [email or web address] for contact info under Cardiology/STEMI.    Signed, Lonie Peak, MD  07/04/2018 8:26 PM

## 2018-07-05 ENCOUNTER — Inpatient Hospital Stay (HOSPITAL_COMMUNITY): Payer: Medicare HMO

## 2018-07-05 DIAGNOSIS — I5033 Acute on chronic diastolic (congestive) heart failure: Secondary | ICD-10-CM

## 2018-07-05 DIAGNOSIS — I340 Nonrheumatic mitral (valve) insufficiency: Secondary | ICD-10-CM

## 2018-07-05 DIAGNOSIS — I361 Nonrheumatic tricuspid (valve) insufficiency: Secondary | ICD-10-CM

## 2018-07-05 DIAGNOSIS — R0602 Shortness of breath: Secondary | ICD-10-CM

## 2018-07-05 LAB — ECHOCARDIOGRAM COMPLETE
HEIGHTINCHES: 59 in
Weight: 3145.6 oz

## 2018-07-05 LAB — URINE CULTURE
MICRO NUMBER:: 180099
SPECIMEN QUALITY:: ADEQUATE

## 2018-07-05 LAB — BASIC METABOLIC PANEL
Anion gap: 12 (ref 5–15)
BUN: 15 mg/dL (ref 8–23)
CHLORIDE: 100 mmol/L (ref 98–111)
CO2: 25 mmol/L (ref 22–32)
Calcium: 9.1 mg/dL (ref 8.9–10.3)
Creatinine, Ser: 1.11 mg/dL — ABNORMAL HIGH (ref 0.44–1.00)
GFR calc Af Amer: 60 mL/min — ABNORMAL LOW (ref >60)
GFR calc non Af Amer: 52 mL/min — ABNORMAL LOW (ref >60)
Glucose, Bld: 484 mg/dL — ABNORMAL HIGH (ref 70–99)
Potassium: 3.7 mmol/L (ref 3.5–5.1)
Sodium: 137 mmol/L (ref 135–145)

## 2018-07-05 LAB — MAGNESIUM: Magnesium: 1.9 mg/dL (ref 1.7–2.4)

## 2018-07-05 LAB — HIV ANTIBODY (ROUTINE TESTING W REFLEX): HIV Screen 4th Generation wRfx: NONREACTIVE

## 2018-07-05 LAB — MRSA PCR SCREENING: MRSA by PCR: NEGATIVE

## 2018-07-05 MED ORDER — ONDANSETRON HCL 4 MG/2ML IJ SOLN: 4.0000 mg | Freq: Four times a day (QID) | INTRAMUSCULAR | Status: DC | PRN

## 2018-07-05 MED ORDER — METOPROLOL TARTRATE 12.5 MG HALF TABLET
12.5000 mg | ORAL_TABLET | Freq: Two times a day (BID) | ORAL | Status: DC
  Administered 2018-07-05: 12.5 mg via ORAL
  Filled 2018-07-05: qty 1

## 2018-07-05 MED ORDER — METOPROLOL TARTRATE 25 MG PO TABS
25.0000 mg | ORAL_TABLET | Freq: Two times a day (BID) | ORAL | Status: DC
  Administered 2018-07-05 – 2018-07-06 (×2): 25 mg via ORAL
  Filled 2018-07-05 (×2): qty 1

## 2018-07-05 MED ORDER — ACETAMINOPHEN 325 MG PO TABS: 650.0000 mg | ORAL_TABLET | ORAL | Status: DC | PRN

## 2018-07-05 MED ORDER — SODIUM CHLORIDE 0.9% FLUSH: 3.0000 mL | INTRAVENOUS | Status: DC | PRN

## 2018-07-05 MED ORDER — FUROSEMIDE 10 MG/ML IJ SOLN
40.0000 mg | Freq: Two times a day (BID) | INTRAMUSCULAR | Status: DC
  Administered 2018-07-06: 40 mg via INTRAVENOUS
  Filled 2018-07-05: qty 4

## 2018-07-05 MED ORDER — HYDRALAZINE HCL 25 MG PO TABS
25.0000 mg | ORAL_TABLET | Freq: Once | ORAL | Status: AC
  Administered 2018-07-05: 25 mg via ORAL
  Filled 2018-07-05: qty 1

## 2018-07-05 MED ORDER — LOSARTAN POTASSIUM 50 MG PO TABS
50.0000 mg | ORAL_TABLET | Freq: Every day | ORAL | Status: DC
  Administered 2018-07-05 – 2018-07-06 (×2): 50 mg via ORAL
  Filled 2018-07-05 (×2): qty 1

## 2018-07-05 MED ORDER — SODIUM CHLORIDE 0.9% FLUSH
3.0000 mL | Freq: Two times a day (BID) | INTRAVENOUS | Status: DC
  Administered 2018-07-05 – 2018-07-07 (×4): 3 mL via INTRAVENOUS

## 2018-07-05 MED ORDER — POTASSIUM CHLORIDE CRYS ER 20 MEQ PO TBCR
20.0000 meq | EXTENDED_RELEASE_TABLET | Freq: Two times a day (BID) | ORAL | Status: DC
  Administered 2018-07-05 – 2018-07-07 (×5): 20 meq via ORAL
  Filled 2018-07-05 (×6): qty 1

## 2018-07-05 MED ORDER — SODIUM CHLORIDE 0.9 % IV SOLN: 250.0000 mL | INTRAVENOUS | Status: DC | PRN

## 2018-07-05 MED ORDER — FUROSEMIDE 10 MG/ML IJ SOLN
40.0000 mg | Freq: Two times a day (BID) | INTRAMUSCULAR | Status: DC
  Administered 2018-07-05 (×2): 40 mg via INTRAVENOUS
  Filled 2018-07-05 (×2): qty 4

## 2018-07-05 MED ORDER — LORATADINE 10 MG PO TABS
10.0000 mg | ORAL_TABLET | Freq: Every day | ORAL | Status: DC
  Filled 2018-07-05: qty 1

## 2018-07-05 MED ORDER — LORATADINE 10 MG PO TABS
10.0000 mg | ORAL_TABLET | Freq: Every day | ORAL | Status: DC
  Administered 2018-07-05 – 2018-07-07 (×3): 10 mg via ORAL
  Filled 2018-07-05 (×2): qty 1

## 2018-07-05 NOTE — Discharge Instructions (Addendum)

## 2018-07-05 NOTE — Progress Notes (Addendum)
Pt arrived to 3e25 from [facility]. Pt is alert and oriented x4. Pt was getting SOB when moving from stretcher to bed. Pt placed on 2L [place]. Pt has abrasions on legs, arms and face. Pt also has bruising on arms and legs. Pt states she has medication in her purse. Rn educates and asks if pt wants medications sent down to pharmacy, pt responded "No I don't want them to go anywhere I will get my friend [name] to come pick them up. Purse was set in closet. Bed alarm activated. Pt in no acute distress. Will continue to monitor pt.

## 2018-07-05 NOTE — Progress Notes (Signed)
Progress Note  Patient Name: Tina [name] Date of Encounter: 07/05/2018  Primary Cardiologist: Prentice Docker, MD   Subjective   No significant overnight events. Shortness of breath is improving. No chest pain, palpitations, lightheadedness, or dizziness.  Inpatient Medications    Scheduled Meds: . apixaban  5 mg Oral BID  . aspirin EC  81 mg Oral Daily  . fenofibrate  54 mg Oral Daily  . furosemide  40 mg Intravenous Q12H  . gabapentin  100 mg Oral QHS  . losartan  50 mg Oral Daily  . metoprolol tartrate  12.5 mg Oral BID  . pantoprazole  40 mg Oral Daily  . potassium chloride  20 mEq Oral BID  . sodium chloride flush  3 mL Intravenous Q12H   Continuous Infusions: . sodium chloride     PRN Meds: sodium chloride, acetaminophen, ondansetron (ZOFRAN) IV, sodium chloride flush   Vital Signs    Vitals:   07/05/18 0141 07/05/18 0152 07/05/18 0527 07/05/18 1238  BP:  127/75 (!) 158/88 (!) 129/91  Pulse:   (!) 107 (!) 103  Resp:   18 18  Temp:   (!) 97.4 F (36.3 C) 97.8 F (36.6 C)  TempSrc:   Oral Oral  SpO2:   98% 97%  Weight: 89.2 kg     Height: 4\' 11"  (1.499 m)       Intake/Output Summary (Last 24 hours) at 07/05/2018 1245 Last data filed at 07/05/2018 0500 Gross per 24 hour  Intake 243 ml  Output 1950 ml  Net -1707 ml   Last 3 Weights 07/05/2018 07/04/2018 07/04/2018  Weight (lbs) 196 lb 9.6 oz 201 lb 201 lb  Weight (kg) 89.177 kg 91.173 kg 91.173 kg      Telemetry    Atrial fibrillation with rates mostly in the 100's to 120's but multiple episodes in the 140's as well. Multiple PVCs and 5 beats of non-sustained VT also noted. - Personally Reviewed  ECG    Atrial fibrillation with ventricular rate of 98 and PVCs. - Personally Reviewed  Physical Exam   GEN: Obese Caucasian female resting comfortably. Alert and in no acute distress.   Neck: Supple. No JVD appreciated. Cardiac: Tachycardic with irregularly irregular rhythm. No significant murmurs,  rubs, or gallops. Radial and distal pedal pulses 2+ and equal bilaterally. Respiratory: No increased work of breathing. Crackles noted in bilateral bases. GI: Abdomen soft, obese, and non-tender. MS: 1+ pitting edema of bilateral lower extremities. Skin: Warm and dry.  Neuro:  No focal deficits. Psych: Normal affect. Responds appropriately.  Labs    Chemistry Recent Labs  Lab 07/04/18 1722  NA 138  K 3.9  CL 102  CO2 24  GLUCOSE 270*  BUN 12  CREATININE 0.90  CALCIUM 8.8*  GFRNONAA >60  GFRAA >60  ANIONGAP 12     Hematology Recent Labs  Lab 07/04/18 1722  WBC 7.7  RBC 4.27  HGB 12.8  HCT 40.2  MCV 94.1  MCH 30.0  MCHC 31.8  RDW 13.7  PLT 188    Cardiac EnzymesNo results for input(s): TROPONINI in the last 168 hours.  Recent Labs  Lab 07/04/18 1748  TROPIPOC 0.03     BNP Recent Labs  Lab 07/04/18 1720  BNP 386.9*     DDimer No results for input(s): DDIMER in the last 168 hours.   Radiology    Dg Chest Port 1 View  Result Date: 07/04/2018 CLINICAL DATA:  Persistent shortness of breath. History of congestive  heart failure and atrial fibrillation. EXAM: PORTABLE CHEST 1 VIEW COMPARISON:  05/26/2018 FINDINGS: Cardiomegaly. Pulmonary venous hypertension. Mild interstitial edema but no alveolar edema, infiltrate, collapse or effusion. IMPRESSION: Consistent with mild congestive heart failure. Cardiomegaly. Venous hypertension. Mild interstitial edema. Electronically Signed   By: Paulina FusiMark  Shogry M.D.   On: 07/04/2018 17:15    Cardiac Studies   Echo pending.  Patient Profile   [name] is a 68 year old female with a history of chronic diastolic CHF, paroxysmal atrial fibrillation on Eliquis, hypertension, hyperlipidemia, CVA in 2019, fibromyalgia, GERD, and depression who [facility] to [facility] for evaluation of worsening dyspnea ib exertion and orthopnea.  Assessment & Plan    Acute on Chronic Diastolic CHF - Chest x-ray showed cardiomegaly  with mild interstitial edema and venous hypertension. - BNP elevated at 386.9. - Echo pending. - Diuresing well on IV Lasix. Documented output of 1.95 L in the past 24 hours. - Continue IV Lasix 40mg  twice daily. - Will resume home Losartan and add low dose beta-blocker. - Continue to monitor daily weights, strict I/O's, and renal function.  Atrial Fibrillation - Currently in atrial fibrillation with heart rates mostly in the 100's to 120's but multiple episodes in the 140's as well. One brief run of non-sustained VT up to 5 beats also noted. - TSH normal in 05/2017. - Potassium 3.9. - Will check Magnesium. - Currently not on any rate control mediations. Will add Lopressor 12.5 twice daily. Will likely need to up-titrate this. - Continue chronic anticoagulation with Eliquis.  Hypertension - Most recent BP 158/88.  - Will resume home Losartan 50mg  daily.  Hyperlipidemia  - Lipid panel in 09/2017: Cholesterol 181, Triglycerides 253, HDL 43, LDL 87. - Patient takes Fenofibrate 54mg  daily at home.  For questions or updates, please contact [facility] Please consult [email or web address] for contact info under        Signed, Corrin ParkerCallie E Sharlot Sturkey, PA-C  07/05/2018, 12:45 PM

## 2018-07-05 NOTE — Progress Notes (Signed)
   07/05/18 1115  Mobility  Activity Ambulated in hall  Range of Motion Active;All extremities  Level of Assistance Modified independent, requires aide device or extra time  Assistive Device Four wheel walker  Minutes Stood 9 minutes  Minutes Ambulated 9 minutes  Distance Ambulated (ft) 515 ft  Mobility Response Tolerated well;RN notified (Moves very slow. Amb O2 Sat see note )  Bed Position Chair  SATURATION QUALIFICATIONS: (This note is used to comply with regulatory documentation for home oxygen)  Patient Saturations on Room Air at Rest = 97%  Patient Saturations on Room Air while Ambulating = 99%  Patient Saturations on 0 Liters of oxygen while Ambulating = N/A%  Please briefly explain why patient needs home oxygen:

## 2018-07-05 NOTE — ED Notes (Signed)
Dr. Deforest Hoyles made aware of pt's blood pressure, orders received for same

## 2018-07-05 NOTE — Progress Notes (Signed)
  Echocardiogram 2D Echocardiogram has been performed.  Tina [name] L Androw 07/05/2018, 2:27 PM

## 2018-07-05 NOTE — Plan of Care (Signed)
  Problem: Education: Goal: Ability to demonstrate management of disease process will improve Outcome: Progressing Goal: Ability to verbalize understanding of medication therapies will improve Outcome: Progressing   Problem: Activity: Goal: Capacity to carry out activities will improve Outcome: Progressing   Problem: Cardiac: Goal: Ability to achieve and maintain adequate cardiopulmonary perfusion will improve Outcome: Progressing   Problem: Education: Goal: Knowledge of General Education information will improve Description Including pain rating scale, medication(s)/side effects and non-pharmacologic comfort measures Outcome: Progressing   Problem: Health Behavior/Discharge Planning: Goal: Ability to manage health-related needs will improve Outcome: Progressing   Problem: Clinical Measurements: Goal: Ability to maintain clinical measurements within normal limits will improve Outcome: Progressing Goal: Will remain free from infection Outcome: Progressing Goal: Diagnostic test results will improve Outcome: Progressing Goal: Cardiovascular complication will be avoided Outcome: Progressing   Problem: Activity: Goal: Risk for activity intolerance will decrease Outcome: Progressing   Problem: Nutrition: Goal: Adequate nutrition will be maintained Outcome: Progressing   Problem: Coping: Goal: Level of anxiety will decrease Outcome: Progressing   Problem: Elimination: Goal: Will not experience complications related to bowel motility Outcome: Progressing Goal: Will not experience complications related to urinary retention Outcome: Progressing   Problem: Pain Managment: Goal: General experience of comfort will improve Outcome: Progressing   Problem: Safety: Goal: Ability to remain free from injury will improve Outcome: Progressing   Problem: Skin Integrity: Goal: Risk for impaired skin integrity will decrease Outcome: Progressing

## 2018-07-06 DIAGNOSIS — I4819 Other persistent atrial fibrillation: Secondary | ICD-10-CM

## 2018-07-06 LAB — BASIC METABOLIC PANEL
ANION GAP: 13 (ref 5–15)
BUN: 24 mg/dL — ABNORMAL HIGH (ref 8–23)
CO2: 25 mmol/L (ref 22–32)
Calcium: 8.9 mg/dL (ref 8.9–10.3)
Chloride: 97 mmol/L — ABNORMAL LOW (ref 98–111)
Creatinine, Ser: 1.18 mg/dL — ABNORMAL HIGH (ref 0.44–1.00)
GFR calc Af Amer: 56 mL/min — ABNORMAL LOW (ref >60)
GFR calc non Af Amer: 48 mL/min — ABNORMAL LOW (ref >60)
Glucose, Bld: 457 mg/dL — ABNORMAL HIGH (ref 70–99)
Potassium: 4.2 mmol/L (ref 3.5–5.1)
Sodium: 135 mmol/L (ref 135–145)

## 2018-07-06 LAB — GLUCOSE, CAPILLARY
Glucose-Capillary: 466 mg/dL — ABNORMAL HIGH (ref 70–99)
Glucose-Capillary: 404 mg/dL — ABNORMAL HIGH (ref 70–99)
Glucose-Capillary: 348 mg/dL — ABNORMAL HIGH (ref 70–99)
Glucose-Capillary: 103 mg/dL — ABNORMAL HIGH (ref 70–99)

## 2018-07-06 MED ORDER — LOSARTAN POTASSIUM 25 MG PO TABS
25.0000 mg | ORAL_TABLET | Freq: Every day | ORAL | Status: DC
  Administered 2018-07-07: 25 mg via ORAL
  Filled 2018-07-06: qty 1

## 2018-07-06 MED ORDER — INSULIN GLARGINE 100 UNIT/ML ~~LOC~~ SOLN
110.0000 [IU] | Freq: Every day | SUBCUTANEOUS | Status: DC
  Administered 2018-07-06 – 2018-07-07 (×2): 110 [IU] via SUBCUTANEOUS
  Filled 2018-07-06 (×2): qty 1.1

## 2018-07-06 MED ORDER — METOPROLOL TARTRATE 50 MG PO TABS
50.0000 mg | ORAL_TABLET | Freq: Two times a day (BID) | ORAL | Status: DC
  Administered 2018-07-06 – 2018-07-07 (×2): 50 mg via ORAL
  Filled 2018-07-06 (×2): qty 1

## 2018-07-06 MED ORDER — INSULIN ASPART 100 UNIT/ML ~~LOC~~ SOLN
0.0000 [IU] | Freq: Three times a day (TID) | SUBCUTANEOUS | Status: DC
  Administered 2018-07-06: 11 [IU] via SUBCUTANEOUS
  Administered 2018-07-06: 15 [IU] via SUBCUTANEOUS

## 2018-07-06 MED ORDER — FUROSEMIDE 40 MG PO TABS
40.0000 mg | ORAL_TABLET | Freq: Two times a day (BID) | ORAL | Status: DC
  Administered 2018-07-06 – 2018-07-07 (×2): 40 mg via ORAL
  Filled 2018-07-06 (×2): qty 1

## 2018-07-06 MED ORDER — INSULIN ASPART 100 UNIT/ML ~~LOC~~ SOLN
0.0000 [IU] | Freq: Three times a day (TID) | SUBCUTANEOUS | Status: DC
  Administered 2018-07-07: 3 [IU] via SUBCUTANEOUS

## 2018-07-06 NOTE — Progress Notes (Signed)
Progress Note  Patient Name: Tina CanalJudy H Rodd Date of Encounter: 07/06/2018  Primary Cardiologist: Prentice DockerSuresh Koneswaran, MD  Subjective   Feeling better.  Still short of breath.  She notes that the staff awakened her overnight because her oxygen levels dropped that seem that she stopped breathing.  She denies any palpitations but has continued exertional dyspnea.   Inpatient Medications    Scheduled Meds: . apixaban  5 mg Oral BID  . aspirin EC  81 mg Oral Daily  . fenofibrate  54 mg Oral Daily  . furosemide  40 mg Intravenous BID  . gabapentin  100 mg Oral QHS  . insulin aspart  0-15 Units Subcutaneous TID WC  . loratadine  10 mg Oral Daily  . losartan  50 mg Oral Daily  . metoprolol tartrate  25 mg Oral BID  . pantoprazole  40 mg Oral Daily  . potassium chloride  20 mEq Oral BID  . sodium chloride flush  3 mL Intravenous Q12H   Continuous Infusions: . sodium chloride     PRN Meds: sodium chloride, acetaminophen, ondansetron (ZOFRAN) IV, sodium chloride flush   Vital Signs    Vitals:   07/06/18 0515 07/06/18 0842 07/06/18 0847 07/06/18 1011  BP: 112/86 (!) 135/104 114/89 129/69  Pulse: 81 (!) 101 (!) 102 (!) 108  Resp: 18     Temp: 97.7 F (36.5 C)   98 F (36.7 C)  TempSrc: Oral   Oral  SpO2: 95%   97%  Weight:      Height:        Intake/Output Summary (Last 24 hours) at 07/06/2018 1158 Last data filed at 07/06/2018 1126 Gross per 24 hour  Intake 680 ml  Output 2300 ml  Net -1620 ml   Last 3 Weights 07/06/2018 07/05/2018 07/04/2018  Weight (lbs) 194 lb 12.8 oz 196 lb 9.6 oz 201 lb  Weight (kg) 88.361 kg 89.177 kg 91.173 kg      Telemetry    Atrial fibrillation.  Rate 80s to 100s.- Personally Reviewed  ECG    n/a - Personally Reviewed  Physical Exam   VS:  BP 129/69   Pulse (!) 108   Temp 98 F (36.7 C) (Oral)   Resp 18   Ht 4\' 11"  (1.499 m)   Wt 88.4 kg   SpO2 97%   BMI 39.34 kg/m  , BMI Body mass index is 39.34 kg/m. GENERAL:  Well  appearing HEENT: Pupils equal round and reactive, fundi not visualized, oral mucosa unremarkable NECK:  No jugular venous distention, waveform within normal limits, carotid upstroke brisk and symmetric, no bruits LUNGS:  Clear to auscultation bilaterally HEART:  Irregularly irregular.  Tachycardic.  PMI not displaced or sustained,S1 and S2 within normal limits, no S3, no S4, no clicks, no rubs, no murmurs ABD:  Flat, positive bowel sounds normal in frequency in pitch, no bruits, no rebound, no guarding, no midline pulsatile mass, no hepatomegaly, no splenomegaly EXT:  2 plus pulses throughout, no edema, no cyanosis no clubbing SKIN:  No rashes no nodules NEURO:  Cranial nerves II through XII grossly intact, motor grossly intact throughout PSYCH:  Cognitively intact, oriented to person place and time   Labs    Chemistry Recent Labs  Lab 07/04/18 1722 07/05/18 1239 07/06/18 0457  NA 138 137 135  K 3.9 3.7 4.2  CL 102 100 97*  CO2 24 25 25   GLUCOSE 270* 484* 457*  BUN 12 15 24*  CREATININE 0.90 1.11* 1.18*  CALCIUM 8.8* 9.1 8.9  GFRNONAA >60 52* 48*  GFRAA >60 60* 56*  ANIONGAP 12 12 13      Hematology Recent Labs  Lab 07/04/18 1722  WBC 7.7  RBC 4.27  HGB 12.8  HCT 40.2  MCV 94.1  MCH 30.0  MCHC 31.8  RDW 13.7  PLT 188    Cardiac EnzymesNo results for input(s): TROPONINI in the last 168 hours.  Recent Labs  Lab 07/04/18 1748  TROPIPOC 0.03     BNP Recent Labs  Lab 07/04/18 1720  BNP 386.9*     DDimer No results for input(s): DDIMER in the last 168 hours.   Radiology    Dg Chest Port 1 View  Result Date: 07/04/2018 CLINICAL DATA:  Persistent shortness of breath. History of congestive heart failure and atrial fibrillation. EXAM: PORTABLE CHEST 1 VIEW COMPARISON:  05/26/2018 FINDINGS: Cardiomegaly. Pulmonary venous hypertension. Mild interstitial edema but no alveolar edema, infiltrate, collapse or effusion. IMPRESSION: Consistent with mild congestive  heart failure. Cardiomegaly. Venous hypertension. Mild interstitial edema. Electronically Signed   By: Paulina FusiMark  Shogry M.D.   On: 07/04/2018 17:15    Cardiac Studies   Echo 07/05/18: IMPRESSIONS    1. The left ventricle has normal systolic function, with an ejection fraction of 55-60%. The cavity size was normal. Left ventricular diastology could not be evaluated secondary to atrial fibrillation.  2. The right ventricle has normal systolic function. The cavity was normal. There is no increase in right ventricular wall thickness.  3. Right atrial size was mildly dilated.  4. The mitral valve is normal in structure.  5. The tricuspid valve is normal in structure. Tricuspid valve regurgitation is moderate.  6. The aortic valve is normal in structure.  7. Right atrial pressure is estimated at 3 mmHg.  8. The interatrial septum was not assessed.  Patient Profile     Ms. Tina LucksGriffin is a 89F with chronic diastolic heart failure, paroxysmal atrial fibrillation on Eliquis, prior stroke, hypertension, hyperlipidemia, and carotid stenosis here with acute on chronic diastolic heart failure and atrial fibrillation with RVR.  Assessment & Plan    # Atrial fibrillation with RVR: Rates are better but still >100 at times.  We will increase metoprolol to 50mg  bid.  She has not missed any doses of Eliquis.  Echo yesterday revealed normal left atrial size.  Her IVC was not dilated.  It was poorly visualized and it was difficult to assess whether or not it collapsed.  It seems as though her volume status is much improved but she continues to be dyspneic.  Therefore I think atrial fibrillation is also contributing to her symptoms.  We will plan for cardioversion tomorrow.  #Acute on chronic diastolic heart failure: # Hypertension: LVEF 55 to 60% on echo yesterday.  As above, she appears to be euvolemic.  Will switch Lasix to 40 mg p.o. twice daily.  She notes that she was drinking a lot of soda and thinks this is  where she was having a heavy salt load.  We will reduce losartan to 25mg  for now given that her blood pressure is a little low and we are increasing metoprolol as above.  If her blood pressure becomes elevated we can increase it back to 50 mg daily.  #Diabetes mellitus: Glucose has been poorly-controlled.  It was noted that she has not been getting her home Lantus.  We will add this back and continue aspart.  #Hypertriglyceridemia: Continue fenofibrate  #Prior stroke: Continue aspirin.  She also  has known carotid disease.  She is on Eliquis for atrial fibrillation as above.  For questions or updates, please contact [facility] Please consult [email or web address] for contact info under        Signed, Chilton Si, MD  07/06/2018, 11:58 AM

## 2018-07-06 NOTE — Progress Notes (Addendum)
Inpatient Diabetes Program Recommendations  AACE/ADA: New Consensus Statement on Inpatient Glycemic Control (2015)  Target Ranges:  Prepandial:   less than 140 mg/dL      Peak postprandial:   less than 180 mg/dL (1-2 hours)      Critically ill patients:  140 - 180 mg/dL   Lab Results  Component Value Date   GLUCAP 404 (H) 07/06/2018   HGBA1C 8.5 (A) 02/27/2018    Review of Glycemic Control Results for Tina [name], Tina [name] (MRN [id number]) as of 07/06/2018 15:00  Ref. Range 07/06/2018 07:56 07/06/2018 12:07  Glucose-Capillary Latest Ref Range: 70 - 99 mg/dL 939 (H) 030 (H)   Diabetes history: DM 2 Outpatient Diabetes medications: Basaglar 110 units q AM Current orders for Inpatient glycemic control:  Lantus 110 units daily, Novolog moderate tid with meals  Inpatient Diabetes Program Recommendations:   Home dose of basal insulin restarted today.  Will follow.   Thanks,  Beryl Meager, RN, BC-ADM Inpatient Diabetes Coordinator Pager [phone] (8a-5p)

## 2018-07-06 NOTE — Progress Notes (Signed)
RT offered pt CPAP for the night and pt declined stating she would rather wait until she has had a sleep study done. RT will continue to monitor.

## 2018-07-07 ENCOUNTER — Encounter (HOSPITAL_COMMUNITY)
Admission: EM | Disposition: A | Discharge status: Home / Self Care | Payer: Self-pay | Source: Home / Self Care | Attending: Cardiovascular Disease

## 2018-07-07 LAB — BASIC METABOLIC PANEL
Anion gap: 9 (ref 5–15)
BUN: 23 mg/dL (ref 8–23)
CO2: 28 mmol/L (ref 22–32)
Calcium: 9.2 mg/dL (ref 8.9–10.3)
Chloride: 104 mmol/L (ref 98–111)
Creatinine, Ser: 1.03 mg/dL — ABNORMAL HIGH (ref 0.44–1.00)
GFR calc Af Amer: >60 mL/min (ref >60)
GFR calc non Af Amer: 57 mL/min — ABNORMAL LOW (ref >60)
Glucose, Bld: 91 mg/dL (ref 70–99)
Potassium: 3.6 mmol/L (ref 3.5–5.1)
SODIUM: 141 mmol/L (ref 135–145)

## 2018-07-07 LAB — GLUCOSE, CAPILLARY
Glucose-Capillary: 76 mg/dL (ref 70–99)
Glucose-Capillary: 169 mg/dL — ABNORMAL HIGH (ref 70–99)

## 2018-07-07 SURGERY — CARDIOVERSION
Anesthesia: General

## 2018-07-07 MED ORDER — LOSARTAN POTASSIUM 25 MG PO TABS: 25.0000 mg | ORAL_TABLET | Freq: Every day | ORAL | 2 refills | Status: DC

## 2018-07-07 MED ORDER — METOPROLOL TARTRATE 50 MG PO TABS: 50.0000 mg | ORAL_TABLET | Freq: Two times a day (BID) | ORAL | 2 refills | Status: DC

## 2018-07-07 NOTE — Discharge Summary (Signed)
Discharge Summary    Patient ID: YOSAN ZERGER MRN: [id number]; DOB: Jan 09, 1951  Admit date: 07/04/2018 Discharge date: 07/07/2018  Primary Care Provider: Salley Scarlet, MD  Primary Cardiologist: Prentice Docker, MD  Primary Electrophysiologist:  None   Discharge Diagnoses    Principal Problem:   Acute on chronic diastolic heart failure [facility]) Active Problems:   Diabetes mellitus type II, uncontrolled (HCC)   Hyperlipidemia   Essential hypertension   Fibromyalgia   Depression   Atrial fibrillation (HCC)   Allergies Allergies  Allergen Reactions  . Cefuroxime Axetil Other (See Comments)    Reaction not recalled by the patient  . Fentanyl And Related Other (See Comments)    Patch: unknown reaction by patient  . Niaspan [Niacin Er] Other (See Comments)    Burning all over  . Sulfa Antibiotics Nausea And Vomiting  . Vasotec [Enalapril] Cough  . Welchol [Colesevelam Hcl] Other (See Comments)    Muscle aches and joint pains  . Lyrica [Pregabalin] Rash    Blistering rash around same time as starting drug  . Penicillins Hives and Rash    Has patient had a PCN reaction causing immediate rash, facial/tongue/throat swelling, SOB or lightheadedness with hypotension: Yes Has patient had a PCN reaction causing severe rash involving mucus membranes or skin necrosis: Yes Has patient had a PCN reaction that required hospitalization: No Has patient had a PCN reaction occurring within the last 10 years: No If all of the above answers are "NO", then may proceed with Cephalosporin use.     Diagnostic Studies/Procedures    Echocardiogram 07/05/2018: Impressions:  1. The left ventricle has normal systolic function, with an ejection fraction of 55-60%. The cavity size was normal. Left ventricular diastology could not be evaluated secondary to atrial fibrillation.  2. The right ventricle has normal systolic function. The cavity was normal. There is no increase in right ventricular  wall thickness.  3. Right atrial size was mildly dilated.  4. The mitral valve is normal in structure.  5. The tricuspid valve is normal in structure. Tricuspid valve regurgitation is moderate.  6. The aortic valve is normal in structure.  7. Right atrial pressure is estimated at 3 mmHg.  8. The interatrial septum was not assessed.   History of Present Illness     Ms. Kisiel is a 68 year old female with a history of chronic diastolic CHF, paroxysmal atrial fibrillation on Eliquis, hypertension, hyperlipidemia, CVA in 2019, fibromyalgia, GERD, and depression who is followed by Dr. Purvis Sheffield. Patient presented to the [facility] ED on 07/04/2018 for evaluation of progressively worsening shortness of breath. For the past week or so, she has noticed more dyspnea on exertion. She is also endorsing orthopnea and has had difficulty sleeping at night. She states compliance with her medications. She denies any dietary indiscretions.  Since January of this year, she has not been feeling well.  Her oral Lasix dose was doubled recently along with her potassium pills.  She feels that the diuretic is not as effective now.  Over the past day or so she has noticed more leg swelling.  She is also having resting shortness of breath. She denies any chest pain, presyncope or syncope.  Patient was seen at her PCP's office on the day of presentation where her exam revealed bilateral crackles on lung auscultation.  Her heart rate was elevated into the 130s and her rhythm was atrial fibrillation. Therefore, she was sent to the ED for further evaluation.   In  the ED: EKG showed atrial fibrillation with ventricular rate of 103 bpm and non-specific T wave abnormalities. I-stat troponin negative. Chest x-ray showed cardiomegaly with mild interstitial edema and venous hypertension.   Hospital Course     Consultants: None.  Ms. Caramanica was admitted on 07/04/2018 for acute on chronic diastolic CHF and atrial fibrillation  with RVR. She was diuresed with IV Lasix with good response and started on PO Lopressor. Echocardiogram showed LVEF of 55-60% and normal left atrial size. Initially planned for cardioversion today; however, patient accidentally ate an apple this morning because her blood sugar was low. Therefore, cardioversion was cancelled. Given heart rates have improved after increasing dose of Lopressor and patient is asymptomatic, we will arrange for her to be seen as an outpatient and if she continues to be symptomatic then cardioversion can be arranged at that time. Continue Lopressor 50mg  twice daily. Continue chronic anticoagulation with Eliquis. Continue Aspirin given history of prior stroke and known carotid disease. Home dose of Losartan was reduced given low blood pressures and need to increase Lopressor. Will continue PO Lasix 40mg  twice daily.   Of note, patient's glucose has been poorly controlled while here in the hospital. Glucose has been better since resuming patient's home Lantus. Patient will continue home medications at discharge. Recommend patient follow-up with PCP within 1-2 weeks.   Patient seen and examined by Dr. Duke Salvia today and determined to be stable for discharge. Outpatient follow-up has been arranged. Medications as below.   Discharge Vitals Blood pressure (!) 104/57, pulse (!) 53, temperature 97.6 F (36.4 C), temperature source Oral, resp. rate 19, height 4\' 11"  (1.499 m), weight 88 kg, SpO2 92 %.  Filed Weights   07/05/18 0141 07/06/18 0513 07/07/18 0446  Weight: 89.2 kg 88.4 kg 88 kg    Labs & Radiologic Studies    CBC Recent Labs    07/04/18 1722  WBC 7.7  NEUTROABS 3.8  HGB 12.8  HCT 40.2  MCV 94.1  PLT 188   Basic Metabolic Panel Recent Labs    84/53/64 1239 07/06/18 0457 07/07/18 0256  NA 137 135 141  K 3.7 4.2 3.6  CL 100 97* 104  CO2 25 25 28   GLUCOSE 484* 457* 91  BUN 15 24* 23  CREATININE 1.11* 1.18* 1.03*  CALCIUM 9.1 8.9 9.2  MG 1.9  --   --     Liver Function Tests No results for input(s): AST, ALT, ALKPHOS, BILITOT, PROT, ALBUMIN in the last 72 hours. No results for input(s): LIPASE, AMYLASE in the last 72 hours. Cardiac Enzymes No results for input(s): CKTOTAL, CKMB, CKMBINDEX, TROPONINI in the last 72 hours. BNP Invalid input(s): POCBNP D-Dimer No results for input(s): DDIMER in the last 72 hours. Hemoglobin A1C No results for input(s): HGBA1C in the last 72 hours. Fasting Lipid Panel No results for input(s): CHOL, HDL, LDLCALC, TRIG, CHOLHDL, LDLDIRECT in the last 72 hours. Thyroid Function Tests No results for input(s): TSH, T4TOTAL, T3FREE, THYROIDAB in the last 72 hours.  Invalid input(s): FREET3 _____________  Dg Chest Port 1 View  Result Date: 07/04/2018 CLINICAL DATA:  Persistent shortness of breath. History of congestive heart failure and atrial fibrillation. EXAM: PORTABLE CHEST 1 VIEW COMPARISON:  05/26/2018 FINDINGS: Cardiomegaly. Pulmonary venous hypertension. Mild interstitial edema but no alveolar edema, infiltrate, collapse or effusion. IMPRESSION: Consistent with mild congestive heart failure. Cardiomegaly. Venous hypertension. Mild interstitial edema. Electronically Signed   By: Paulina Fusi M.D.   On: 07/04/2018 17:15   Disposition  Patient is being discharged home today in good condition.  Follow-up Plans & Appointments    Follow-up Information    [facility] Follow up.   Specialty:  Cardiology Why:  You have a hospital follow-up visit scheduled for 05/12/2019 at 10:30am. Please arrive 15 minutes early for check in.  Contact information: [address] [place] [id number] [phone]       Salley Scarleturham, Kawanta F, MD Follow up.   Specialty:  Family Medicine Why:  Please call your primary care physician's office and schedule a follow-up visit within the next 1-2 weeks.  Contact information: [address] [place]  [id number] [phone]          Discharge Instructions    Diet - low sodium heart healthy   Complete by:  As directed    Increase activity slowly   Complete by:  As directed       Discharge Medications   Allergies as of 07/07/2018      Reactions   Cefuroxime Axetil Other (See Comments)   Reaction not recalled by the patient   Fentanyl And Related Other (See Comments)   Patch: unknown reaction by patient   Niaspan [niacin Er] Other (See Comments)   Burning all over   Sulfa Antibiotics Nausea And Vomiting   Vasotec [enalapril] Cough   Welchol [colesevelam Hcl] Other (See Comments)   Muscle aches and joint pains   Lyrica [pregabalin] Rash   Blistering rash around same time as starting drug   Penicillins Hives, Rash   Has patient had a PCN reaction causing immediate rash, facial/tongue/throat swelling, SOB or lightheadedness with hypotension: Yes Has patient had a PCN reaction causing severe rash involving mucus membranes or skin necrosis: Yes Has patient had a PCN reaction that required hospitalization: No Has patient had a PCN reaction occurring within the last 10 years: No If all of the above answers are "NO", then may proceed with Cephalosporin use.      Medication List    STOP taking these medications   HYDROcodone-acetaminophen 5-325 MG tablet Commonly known as:  NORCO     TAKE these medications   albuterol 108 (90 Base) MCG/ACT inhaler Commonly known as:  PROVENTIL HFA;VENTOLIN HFA Inhale 2 puffs into the lungs every 6 (six) hours as needed for wheezing or shortness of breath.   apixaban 5 MG Tabs tablet Commonly known as:  ELIQUIS Take 1 tablet (5 mg total) by mouth 2 (two) times daily.   aspirin 81 MG EC tablet Take 1 tablet (81 mg total) by mouth daily. What changed:  when to take this   BASAGLAR KWIKPEN 100 UNIT/ML Sopn Inject 1.1 mLs (110 Units total) into the skin every morning. What changed:  when to take this   BENGAY EX Apply 1 application  topically as needed (to painful sites).   benzonatate 100 MG capsule Commonly known as:  TESSALON Take 1 capsule (100 mg total) by mouth 3 (three) times daily as needed for cough.   cetirizine 10 MG tablet Commonly known as:  ZYRTEC Take 10 mg by mouth daily.   clotrimazole-betamethasone cream Commonly known as:  LOTRISONE Apply 1 application topically 2 (two) times daily. What changed:    when to take this  reasons to take this   fenofibrate 54 MG tablet Take 1 tablet (54 mg total) by mouth daily.   Fish Oil 1000 MG Caps Take 2 capsules (2,000 mg total) by mouth 2 (two) times daily. What  changed:    how much to take  when to take this   furosemide 40 MG tablet Commonly known as:  LASIX Take 1 tablet (40 mg total) by mouth 2 (two) times daily.   gabapentin 100 MG capsule Commonly known as:  NEURONTIN Take 1 capsule (100 mg total) by mouth at bedtime.   ipratropium 0.02 % nebulizer solution Commonly known as:  ATROVENT Take 2.5 mLs (0.5 mg total) by nebulization every 4 (four) hours as needed for wheezing or shortness of breath.   ipratropium 0.06 % nasal spray Commonly known as:  ATROVENT Place 2 sprays into both nostrils 4 (four) times daily as needed for rhinitis.   losartan 25 MG tablet Commonly known as:  COZAAR Take 1 tablet (25 mg total) by mouth daily. Start taking on:  July 08, 2018 What changed:    medication strength  how much to take   metoprolol tartrate 50 MG tablet Commonly known as:  LOPRESSOR Take 1 tablet (50 mg total) by mouth 2 (two) times daily.   montelukast 10 MG tablet Commonly known as:  SINGULAIR 10MG  BY MOUTH ONCE DAILY What changed:    how much to take  how to take this  when to take this  additional instructions   MUSCLE RUB EX Apply 1 application topically as needed (to painful sites).   nystatin powder Commonly known as:  MYCOSTATIN/NYSTOP Apply topically 4 (four) times daily. What changed:    how much  to take  when to take this  reasons to take this   pantoprazole 40 MG tablet Commonly known as:  PROTONIX Take 1 tablet (40 mg total) by mouth daily.   PARoxetine 40 MG tablet Commonly known as:  PAXIL 40MG  BY MOUTH ONCE DAILY What changed:    how much to take  how to take this  when to take this  additional instructions   potassium chloride SA 20 MEQ tablet Commonly known as:  K-DUR,KLOR-CON Take 1 tablet (20 mEq total) by mouth daily.   TRUEPLUS INSULIN SYRINGE 31G X 5/16" 1 ML Misc Generic drug:  Insulin Syringe-Needle U-100 USE AS DIRECTED TO INJECT What changed:  See the new instructions.          Outstanding Labs/Studies   None.  Duration of Discharge Encounter   Greater than 30 minutes including physician time.  Signed, Corrin Parkerallie E Ameilia Rattan, PA-C 07/07/2018, 3:34 PM

## 2018-07-07 NOTE — Progress Notes (Signed)
Progress Note  Patient Name: Tina [name] Date of Encounter: 07/07/2018  Primary Cardiologist: Prentice Docker, MD  Subjective   Feeling better.  Shortness of breath improved.  She accidentally ate an apple this AM because her blood glucose was low.   Inpatient Medications    Scheduled Meds: . apixaban  5 mg Oral BID  . aspirin EC  81 mg Oral Daily  . fenofibrate  54 mg Oral Daily  . furosemide  40 mg Oral BID  . gabapentin  100 mg Oral QHS  . insulin aspart  0-15 Units Subcutaneous TID WC  . insulin glargine  110 Units Subcutaneous Daily  . loratadine  10 mg Oral Daily  . losartan  25 mg Oral Daily  . metoprolol tartrate  50 mg Oral BID  . pantoprazole  40 mg Oral Daily  . potassium chloride  20 mEq Oral BID  . sodium chloride flush  3 mL Intravenous Q12H   Continuous Infusions: . sodium chloride     PRN Meds: sodium chloride, acetaminophen, ondansetron (ZOFRAN) IV, sodium chloride flush   Vital Signs    Vitals:   07/07/18 0446 07/07/18 0451 07/07/18 0900 07/07/18 0926  BP:  112/72 105/86   Pulse:  99 91   Resp:  18    Temp:  97.6 F (36.4 C)    TempSrc:  Oral    SpO2:  100% 92% 95%  Weight: 88 kg     Height:        Intake/Output Summary (Last 24 hours) at 07/07/2018 0947 Last data filed at 07/07/2018 0810 Gross per 24 hour  Intake 830 ml  Output 2800 ml  Net -1970 ml   Last 3 Weights 07/07/2018 07/06/2018 07/05/2018  Weight (lbs) 193 lb 14.4 oz 194 lb 12.8 oz 196 lb 9.6 oz  Weight (kg) 87.952 kg 88.361 kg 89.177 kg      Telemetry    Atrial fibrillation.  Rate 80s to 100s.- Personally Reviewed  ECG    n/a - Personally Reviewed  Physical Exam   VS:  BP 105/86 (BP Location: Right Arm)   Pulse 91   Temp 97.6 F (36.4 C) (Oral)   Resp 18   Ht 4\' 11"  (1.499 m)   Wt 88 kg   SpO2 95%   BMI 39.16 kg/m  , BMI Body mass index is 39.16 kg/m. GENERAL:  Well appearing HEENT: Pupils equal round and reactive, fundi not visualized, oral mucosa  unremarkable NECK:  No jugular venous distention, waveform within normal limits, carotid upstroke brisk and symmetric, no bruits LUNGS:  Clear to auscultation bilaterally HEART:  Irregularly irregular.  Tachycardic.  PMI not displaced or sustained,S1 and S2 within normal limits, no S3, no S4, no clicks, no rubs, no murmurs ABD:  Flat, positive bowel sounds normal in frequency in pitch, no bruits, no rebound, no guarding, no midline pulsatile mass, no hepatomegaly, no splenomegaly EXT:  2 plus pulses throughout, no edema, no cyanosis no clubbing SKIN:  No rashes no nodules NEURO:  Cranial nerves II through XII grossly intact, motor grossly intact throughout [facility]:  Cognitively intact, oriented to person place and time   Labs    Chemistry Recent Labs  Lab 07/05/18 1239 07/06/18 0457 07/07/18 0256  NA 137 135 141  K 3.7 4.2 3.6  CL 100 97* 104  CO2 25 25 28   GLUCOSE 484* 457* 91  BUN 15 24* 23  CREATININE 1.11* 1.18* 1.03*  CALCIUM 9.1 8.9 9.2  GFRNONAA 52*  48* 57*  GFRAA 60* 56* >60  ANIONGAP 12 13 9      Hematology Recent Labs  Lab 07/04/18 1722  WBC 7.7  RBC 4.27  HGB 12.8  HCT 40.2  MCV 94.1  MCH 30.0  MCHC 31.8  RDW 13.7  PLT 188    Cardiac EnzymesNo results for input(s): TROPONINI in the last 168 hours.  Recent Labs  Lab 07/04/18 1748  TROPIPOC 0.03     BNP Recent Labs  Lab 07/04/18 1720  BNP 386.9*     DDimer No results for input(s): DDIMER in the last 168 hours.   Radiology    No results found.  Cardiac Studies   Echo 07/05/18: IMPRESSIONS    1. The left ventricle has normal systolic function, with an ejection fraction of 55-60%. The cavity size was normal. Left ventricular diastology could not be evaluated secondary to atrial fibrillation.  2. The right ventricle has normal systolic function. The cavity was normal. There is no increase in right ventricular wall thickness.  3. Right atrial size was mildly dilated.  4. The mitral valve  is normal in structure.  5. The tricuspid valve is normal in structure. Tricuspid valve regurgitation is moderate.  6. The aortic valve is normal in structure.  7. Right atrial pressure is estimated at 3 mmHg.  8. The interatrial septum was not assessed.  Patient Profile     [name] is a 79F with chronic diastolic heart failure, paroxysmal atrial fibrillation on Eliquis, prior stroke, hypertension, hyperlipidemia, and carotid stenosis here with acute on chronic diastolic heart failure and atrial fibrillation with RVR.  Assessment & Plan    # Atrial fibrillation with RVR: Rates are now under 100 bpm after increasing metoprolol.  We were planning for cardioversion which he ate an apple this morning.  Therefore we will not plan to pursue cardioversion at this time.  Given that she is asymptomatic with rate control we will continue with this alone for now.  Continue Eliquis.  We will arrange for her to be seen as an outpatient and if she continues to be symptomatic then cardioversion can be arranged at that time.    # Acute on chronic diastolic heart failure: # Hypertension: LVEF 55 to 60% on echo this admission.  As above, she appears to be euvolemic.  She has maintained a negative fluid balance on oral Lasix.  Renal function is stable.  Losartan was reduced given her low blood pressure and increasing metoprolol.  Plan for discharge today.  # Diabetes mellitus: Glucose has been poorly-controlled.  It was noted that she has not been getting her home Lantus.  Lantus was resumed and her blood sugar is now much better, though it was a little low this morning.  We will allow her to eat breakfast and check it later today.  If her glucose is stable plan for discharge.  # Hypertriglyceridemia: Continue fenofibrate  # Prior stroke: Continue aspirin.  She also has known carotid disease.  She is on Eliquis for atrial fibrillation as above.  For questions or updates, please contact [facility] Please consult [email or web address] for contact info under        Signed, Chilton Si, MD  07/07/2018, 9:47 AM

## 2018-07-07 NOTE — Care Management Important Message (Signed)
Important Message  Patient Details  Name: Tina [name] MRN: [id number] Date of Birth: 02-Sep-1950   Medicare Important Message Given:  Yes    [name] 07/07/2018, 11:48 AM

## 2018-07-07 NOTE — Evaluation (Signed)
Physical Therapy Evaluation Patient Details Name: Tina [name] MRN: [id number] DOB: 1950/10/05 Today's Date: 07/07/2018   History of Present Illness  Pt is a 68 y.o. female admitted 07/04/18 with DOE; worked up for CHF and afib with RVR. S/p echo 2/12 showing LVEF 55-60%. PMH includes DM, HTN, fibromyalgia, prior stroke.     Clinical Impression  Pt presents with an overall decrease in functional mobility secondary to above. PTA, pt mod indep with rollator; lives alone. Today, pt ambulatory in room without device at supervision-level due to intermittent self-corrected instability; SpO2 92-94% on RA, DOE 3/4 with minimal activity. Hallway ambulation deferred due to pt c/o feeling "weak/woozy... like my sugar is low" and awaiting breakfast. BP 125/108. Pt would benefit from continued acute PT services to maximize functional mobility and independence prior to d/c home.     Follow Up Recommendations No PT follow up    Equipment Recommendations  None recommended by PT    Recommendations for Other Services       Precautions / Restrictions Precautions Precautions: Fall Restrictions Weight Bearing Restrictions: No      Mobility  Bed Mobility Overal bed mobility: Independent                Transfers Overall transfer level: Independent Equipment used: None             General transfer comment: Indep standing from bed, toilet, and recliner  Ambulation/Gait Ambulation/Gait assistance: Supervision Gait Distance (Feet): 40 Feet Assistive device: None Gait Pattern/deviations: Step-through pattern;Decreased stride length;Drifts right/left   Gait velocity interpretation: 1.31 - 2.62 ft/sec, indicative of limited community ambulator General Gait Details: Ambulatory in room without device, intermittently reaching to furniture for UE support, DOE 3/4; self-corrected instability, supervision for safety. Hallway ambulation deferred secondary to pt feeling "weak/woozy... like my  sugar is low" (awaiting breakfast)  Stairs            Wheelchair Mobility    Modified Rankin (Stroke Patients Only)       Balance Overall balance assessment: Needs assistance   Sitting balance-Leahy Scale: Good       Standing balance-Leahy Scale: Good                               Pertinent Vitals/Pain Pain Assessment: No/denies pain    Home Living Family/patient expects to be discharged to:: Private residence Living Arrangements: Alone Available Help at Discharge: Friend(s);Available PRN/intermittently Type of Home: House Home Access: Level entry     Home Layout: One level Home Equipment: Walker - 4 wheels;Shower seat;Grab bars - tub/shower;Grab bars - toilet Additional Comments: Sometimes wears O2 at night. Awaiting sleep study    Prior Function Level of Independence: Independent with assistive device(s)         Comments: Household ambulation without device. Community ambulation with rollator. Endorses falls but cannot remember when (reports memory issues since prior CVA)     Hand Dominance        Extremity/Trunk Assessment   Upper Extremity Assessment Upper Extremity Assessment: Overall WFL for tasks assessed    Lower Extremity Assessment Lower Extremity Assessment: Overall WFL for tasks assessed       Communication   Communication: No difficulties  Cognition Arousal/Alertness: Awake/alert Behavior During Therapy: WFL for tasks assessed/performed Overall Cognitive Status: Within Functional Limits for tasks assessed  General Comments General comments (skin integrity, edema, etc.): SpO2 92-94% on RA; BP 125/108    Exercises     Assessment/Plan    PT Assessment Patient needs continued PT services  PT Problem List Decreased activity tolerance;Decreased balance;Decreased mobility       PT Treatment Interventions DME instruction;Gait training;Functional mobility  training;Therapeutic activities;Therapeutic exercise;Balance training;Patient/family education    PT Goals (Current goals can be found in the Care Plan section)  Acute Rehab PT Goals Patient Stated Goal: Return home without getting so short of breath PT Goal Formulation: With patient Time For Goal Achievement: 07/21/18 Potential to Achieve Goals: Good    Frequency Min 3X/week   Barriers to discharge Decreased caregiver support      Co-evaluation               AM-PAC PT "6 Clicks" Mobility  Outcome Measure Help needed turning from your back to your side while in a flat bed without using bedrails?: None Help needed moving from lying on your back to sitting on the side of a flat bed without using bedrails?: None Help needed moving to and from a bed to a chair (including a wheelchair)?: None Help needed standing up from a chair using your arms (e.g., wheelchair or bedside chair)?: A Little Help needed to walk in hospital room?: A Little Help needed climbing 3-5 steps with a railing? : A Little 6 Click Score: 21    End of Session   Activity Tolerance: Patient tolerated treatment well Patient left: in chair;with call bell/phone within reach Nurse Communication: Mobility status PT Visit Diagnosis: Other abnormalities of gait and mobility (R26.89)    Time: 2542-7062 PT Time Calculation (min) (ACUTE ONLY): 19 min   Charges:   PT Evaluation $PT Eval Low Complexity: 1 Low        Ina Homes, PT, DPT Acute Rehabilitation Services  Pager [phone] Office [phone]  Malachy Chamber 07/07/2018, 8:52 AM

## 2018-07-10 ENCOUNTER — Telehealth: Payer: Self-pay | Admitting: *Deleted

## 2018-07-10 NOTE — Telephone Encounter (Signed)
Received call from patient friend, Yong Channel [phone]~ telephone.   Reports that patient has been discharged home and requires medication refills.   Requested return call to discuss.   Call placed to patient. LMTRC.

## 2018-07-11 ENCOUNTER — Other Ambulatory Visit: Payer: Self-pay | Admitting: *Deleted

## 2018-07-11 MED ORDER — HYDROCODONE-ACETAMINOPHEN 5-325 MG PO TABS: 1.0000 | ORAL_TABLET | Freq: Two times a day (BID) | ORAL | 0 refills | Status: DC | PRN

## 2018-07-11 MED ORDER — GABAPENTIN 100 MG PO CAPS: 100.0000 mg | ORAL_CAPSULE | Freq: Every day | ORAL | 3 refills | Status: DC

## 2018-07-11 NOTE — Telephone Encounter (Signed)
Medication refilled

## 2018-07-11 NOTE — Telephone Encounter (Signed)
Patient Tina [name] returned call. Reports that patient requires refills on Hydrocodone/APAP, Gabapentin, Metoprolol , and Losartan.   Advised to contact cards for RF on Metoprolol and Losartan. Verbalized understanding.   Ok to refill Hydrocodone/APAP??  Last office visit 07/04/2018  Last refill 05/05/2018.

## 2018-07-12 ENCOUNTER — Ambulatory Visit: Payer: Medicare HMO | Admitting: Physician Assistant

## 2018-07-13 ENCOUNTER — Other Ambulatory Visit: Payer: Self-pay

## 2018-07-13 NOTE — Patient Outreach (Signed)
[facility]) Care Management  07/13/2018  Tina [name] [birth year] [id number]   EMMI- Heart Failure RED ON EMMI ALERT Day # 2 Date: 07/12/2018 Red Alert Reason:  Weighed themselves today? No  Have a reliable bathroom scale? No     Outreach attempt: spoke with patient.  She reports she thinks she is doing well.  Discussed red alerts with patient.  She states that she does not have a scale to weigh but reports that she monitors by how she feels, increased shortness of breath and swelling.  She states that yesterday she did take an extra lasix per her order from her physician.  Advised patient that CM will have scale sent to patient and that she will probably get it next week.  She verbalized understanding.  Discussed with patient HF, low salt diet, symptoms and when to notify physician.  She verbalized understanding.  Patient states she has an appointment with her PCP next week and will be making an appointment with the cardiologist.  Patient denies any problems with transportation or medications.  Advised patient that CM would call next week to check on her and see if scale arrived.  She verbalized understanding.     Plan: RN CM will outreach patient next week.    Bary Leriche, RN, MSN [facility] Care Management Care Management Coordinator Direct Line [phone] Toll Free: [phone]  Fax: [phone]

## 2018-07-14 ENCOUNTER — Other Ambulatory Visit: Payer: Self-pay

## 2018-07-14 NOTE — Patient Outreach (Signed)
[facility]) Care Management  07/14/2018  Tina [name] 09-16-1950 [id number]   EMMI- Heart Failure RED ON EMMI ALERT Day # 3 Date: 07/13/2018 Red Alert Reason:  Any new problems? Yes  New/worsening problems? Yes  New swelling? Yes  Had diarrhea or felt sick to stomach? Yes  Fever or chills? Yes  Given new prescriptions to fill? Yes    Outreach attempt: spoke with patient.  She is able to verify HIPAA.  Addressed red alerts with patient.  She reports that she feels fine and is up doing things in her apartment today.  She states she is not short of breath and her swelling is minimal.  Discussed with patient low salt diet and heart failure and how salt affects the heart failure. She verbalized understanding.  Patient appreciative of call.  Advised patient that CM would follow up with patient next week about her receiving the scale.  She verbalized understanding.   Plan: RN CM will outreach patient next week.    Bary Leriche, RN, MSN [facility] Care Management Care Management Coordinator Direct Line [phone] Toll Free: [phone]  Fax: [phone]

## 2018-07-17 ENCOUNTER — Other Ambulatory Visit: Payer: Self-pay

## 2018-07-17 NOTE — Patient Outreach (Signed)
[facility]) Care Management  07/17/2018  CHANTAL HATCH 12-Jun-1950 [id number]   EMMI- Heart Failure RED ON EMMI ALERT Day # 4 Date: 07/14/2018 Red Alert Reason:  Weighed themselves today? no  Patient is not weighing herself as she does not have a scale.  CM ordered scale on 07/13/2018   Plan: RN CM will outreach patient this week to follow up receiving scale.    Bary Leriche, RN, MSN [facility] Care Management Care Management Coordinator Direct Line [phone] Toll Free: [phone]  Fax: [phone]

## 2018-07-18 ENCOUNTER — Other Ambulatory Visit: Payer: Self-pay

## 2018-07-18 ENCOUNTER — Ambulatory Visit (INDEPENDENT_AMBULATORY_CARE_PROVIDER_SITE_OTHER): Payer: Medicare HMO | Admitting: Family Medicine

## 2018-07-18 ENCOUNTER — Encounter: Payer: Self-pay | Admitting: Family Medicine

## 2018-07-18 VITALS — BP 118/68 | HR 80 | Temp 98.6°F | Resp 16 | Ht 59.0 in | Wt 196.0 lb

## 2018-07-18 DIAGNOSIS — L989 Disorder of the skin and subcutaneous tissue, unspecified: Secondary | ICD-10-CM | POA: Diagnosis not present

## 2018-07-18 DIAGNOSIS — I10 Essential (primary) hypertension: Secondary | ICD-10-CM | POA: Diagnosis not present

## 2018-07-18 DIAGNOSIS — I5032 Chronic diastolic (congestive) heart failure: Secondary | ICD-10-CM | POA: Diagnosis not present

## 2018-07-18 DIAGNOSIS — E11649 Type 2 diabetes mellitus with hypoglycemia without coma: Secondary | ICD-10-CM

## 2018-07-18 DIAGNOSIS — I480 Paroxysmal atrial fibrillation: Secondary | ICD-10-CM | POA: Diagnosis not present

## 2018-07-18 DIAGNOSIS — G4733 Obstructive sleep apnea (adult) (pediatric): Secondary | ICD-10-CM | POA: Diagnosis not present

## 2018-07-18 MED ORDER — ROSUVASTATIN CALCIUM 40 MG PO TABS: 40.0000 mg | ORAL_TABLET | Freq: Every day | ORAL | 3 refills | Status: DC

## 2018-07-18 NOTE — Progress Notes (Signed)
Subjective:    Patient ID: Tina [name], female    DOB: [birth year], 68 y.o.   MRN: [id number]  Patient presents for Hospital F/U (CHF exacerbation)  Patient here for hospital follow-up.  At her last visit a center for admission secondary to acute on chronic heart failure as well as proximal atrial fibrillation with RVR and uncontrolled diabetes mellitus. She was admitted for 3 days for treatment.  Discharge summary reviewed in detail. She was diuresed with IV Lasix and started on oral Lopressor.  Her echo showed a preserved ejection fraction 55 to 60% indicating more diastolic heart failure.  Her heart rate did improved with the Lopressor she was continued on 50 mg twice a day.  She was already chronically anticoagulated with Eliquis.  Aspirin was also continued.  Her blood pressures were a little low the setting of the diuresis of her losartan was reduced to 25 mg.  She was continued on Lasix 40 mg twice a day.  Her glucose was significantly elevated while she was in the hospital which was previously known that she had uncontrolled diabetes.  She  Does not have follow-up with her endocrinologist scheduled yet    Her weight at discharge was 193lbs, today 196lbs States she feels much better, breathing ismuch better She is with family friend Tina [name], whom she will be moving closer too and will help assist her   [facility]- states she needs sleep study, told her in hospital something wrong with breathing,s he has been set up for this multiple times in the past, but willing to go now   Note she has been off crestor past month or so, needs refilled   Wants referral to different dermatologist for her skin lesions  Review Of Systems:  GEN- denies fatigue, fever, weight loss,weakness, recent illness HEENT- denies eye drainage, change in vision, nasal discharge, CVS- denies chest pain, palpitations RESP- denies SOB, cough, wheeze ABD- denies N/V, change in stools, abd pain GU- denies dysuria,  hematuria, dribbling, incontinence MSK- + joint pain, muscle aches, injury Neuro- denies headache, dizziness, syncope, seizure activity       Objective:    BP 118/68   Pulse 80   Temp 98.6 F (37 C) (Oral)   Resp 16   Ht 4\' 11"  (1.499 m)   Wt 196 lb (88.9 kg)   SpO2 96%   BMI 39.59 kg/m  GEN- NAD, alert and oriented x3 HEENT- PERRL, EOMI, non injected sclera, pink conjunctiva, MMM, oropharynx clear Neck- Supple, no thyromegaly CVS- irregular rhythem, normal rate , no murmur RESP-CTAB ABD-NABS,soft,NT,ND Skin- multiple scabs on forehad, chin, cheeks  EXT- No edema Pulses- Radial, DP- 2+        Assessment & Plan:      Problem List Items Addressed This Visit      Unprioritized   Atrial fibrillation (HCC)    Continued A fib, on eliquis, rate controlled Cardiology appt scheduled for March, possible cardioversion needed      Relevant Medications   rosuvastatin (CRESTOR) 40 MG tablet   CHF (congestive heart failure) (HCC)    Continue Lasix and metoprolol      Relevant Medications   rosuvastatin (CRESTOR) 40 MG tablet   Diabetes mellitus type II, uncontrolled (HCC) - Primary    Uncontrolled, pt to call her endocrinologist, continue the current dose of insulin they prescribed       Relevant Medications   rosuvastatin (CRESTOR) 40 MG tablet   Essential hypertension    Well controlled no changes  Relevant Medications   rosuvastatin (CRESTOR) 40 MG tablet   Other Relevant Orders   Basic metabolic panel   Obstructive sleep apnea    Referral for sleep study      Relevant Orders   Ambulatory referral to Sleep Studies    Other Visit Diagnoses    Skin lesion of face       Relevant Orders   Ambulatory referral to Dermatology      Note: This dictation was prepared with Dragon dictation along with smaller phrase technology. Any transcriptional errors that result from this process are unintentional.

## 2018-07-18 NOTE — Assessment & Plan Note (Signed)
Uncontrolled, pt to call her endocrinologist, continue the current dose of insulin they prescribed

## 2018-07-18 NOTE — Assessment & Plan Note (Signed)
Continue metoprolol 

## 2018-07-18 NOTE — Patient Instructions (Addendum)
Referral to Dr. Orvan Falconer  Referral for sleep study Continue your current medications Schedule appointment with Dr. Everardo All F/U 4 months

## 2018-07-18 NOTE — Assessment & Plan Note (Signed)
Well controlled no changes 

## 2018-07-18 NOTE — Assessment & Plan Note (Signed)
Continued A fib, on eliquis, rate controlled Cardiology appt scheduled for March, possible cardioversion needed

## 2018-07-18 NOTE — Assessment & Plan Note (Signed)
Referral for sleep study

## 2018-07-19 LAB — BASIC METABOLIC PANEL
BUN/Creatinine Ratio: 19 (calc) (ref 6–22)
BUN: 19 mg/dL (ref 7–25)
CO2: 26 mmol/L (ref 20–32)
Calcium: 9.2 mg/dL (ref 8.6–10.4)
Chloride: 101 mmol/L (ref 98–110)
Creat: 1.01 mg/dL — ABNORMAL HIGH (ref 0.50–0.99)
Glucose, Bld: 459 mg/dL — ABNORMAL HIGH (ref 65–99)
Potassium: 4.2 mmol/L (ref 3.5–5.3)
Sodium: 137 mmol/L (ref 135–146)

## 2018-07-20 ENCOUNTER — Other Ambulatory Visit: Payer: Self-pay

## 2018-07-20 NOTE — Patient Outreach (Signed)
[facility]) Care Management  07/20/2018  Tina [name] Mar 12, 1951 [id number]   EMMI- Heart Failure RED ON EMMI ALERT Day # 9 Date: 07/19/2018 Red Alert Reason:  New/worsening problems? yes  Outreach attempt: no answer.  HIPAA compliant voice message left.     Plan: RN CM will attempt again within 4 business days and send letter.  Bary Leriche, RN, MSN [facility] Care Management Care Management Coordinator Direct Line [phone] Toll Free: [phone]  Fax: [phone]

## 2018-07-21 ENCOUNTER — Ambulatory Visit: Payer: Medicare HMO | Admitting: Physician Assistant

## 2018-07-21 ENCOUNTER — Other Ambulatory Visit: Payer: Self-pay

## 2018-07-21 NOTE — Patient Outreach (Signed)
[facility]) Care Management  07/21/2018  Tina [name] 1951-04-20 [id number]   EMMI- Heart Failure RED ON EMMI ALERT Day # 9 Date:07/19/2018 Red Alert Reason: New/worsening problems? yes  Outreach attempt: no answer.  HIPAA compliant voice message left.     Plan: RN CM will attempt again within 4 business days.  Bary Leriche, RN, MSN [facility] Care Management Care Management Coordinator Direct Line [phone] Cell [phone] Toll Free: [phone]  Fax: [phone]

## 2018-07-24 ENCOUNTER — Other Ambulatory Visit: Payer: Self-pay

## 2018-07-24 NOTE — Patient Outreach (Signed)
[facility]) Care Management  07/24/2018  KEAGAN TREZISE 19-Mar-1951 [id number]   EMMI-Heart Failure RED ON EMMI ALERT Day #9 Date:07/19/2018 Red Alert Reason: New/worsening problems? yes  Outreach attempt:no answer. HIPAA compliant voice message left.    Plan: RN CM willwait return call.  If no return call will close case.  Bary Leriche, RN, MSN [facility] Care Management Care Management Coordinator Direct Line [phone] Cell [phone] Toll Free: [phone]  Fax: [phone]

## 2018-07-24 NOTE — Patient Outreach (Signed)
[facility]) Care Management  07/24/2018  Tina [name] November 10, 1950 [id number]   EMMI-Heart Failure RED ON EMMI ALERT Day #9 Date:07/19/2018 Red Alert Reason: New/worsening problems? Yes  Incoming call from patient. She is able to verify HIPAA.  Discussed with patient red alert and if she received her scale. She states she has no problems and that she received her scale and is weighing daily. She reports that her last weight was 187.6 lbs.  She states she is proud of her progress and is appreciative of scale.  Patient declines any further needs at this time.    Plan: RN CM will close case.    Bary Leriche, RN, MSN [facility] Care Management Care Management Coordinator Direct Line [phone] Cell [phone] Toll Free: [phone]  Fax: [phone]

## 2018-07-27 ENCOUNTER — Telehealth: Payer: Self-pay

## 2018-07-27 NOTE — Telephone Encounter (Signed)
Received notification from [facility] requesting completion of Rx form. Per Dr. Everardo All, unable to complete without an appt. Called pt to schedule f/u appt. LVM requesting returned call.

## 2018-07-28 ENCOUNTER — Other Ambulatory Visit: Payer: Self-pay

## 2018-07-28 NOTE — Patient Outreach (Signed)
[facility]) Care Management  07/28/2018  KRISTEE HOLNESS 1951-01-26 [id number]   EMMI- Heart Failure RED ON EMMI ALERT Day # 17 Date: 07/27/2018 Red Alert Reason:  Weight 193 lbs  Outreach attempt: no answer.  Unable to leave a message.   Plan: RN CM will attempt again within 4 business days and send letter.   Bary Leriche, RN, MSN [facility] Care Management Care Management Coordinator Direct Line [phone] Toll Free: [phone]  Fax: [phone]

## 2018-07-31 ENCOUNTER — Other Ambulatory Visit: Payer: Self-pay

## 2018-07-31 NOTE — Patient Outreach (Addendum)
[facility]) Care Management  07/31/2018  Tina [name] [birth year] [id number]   EMMI- Heart Failure RED ON EMMI ALERT Day # 17 Date:07/27/2018 Red Alert Reason: Weight 193 lbs  EMMI- Heart Failure RED ON EMMI ALERT Day # 19 Date:07/29/2018 Red Alert Reason: Weight 194 lbs  Outreach attempt: no answer.  Unable to leave a message.   Plan: RN CM will attempt again within 4 business days.  Tina Leriche, RN, MSN [facility] Care Management Care Management Coordinator Direct Line [phone] Cell [phone] Toll Free: [phone]  Fax: [phone]

## 2018-08-01 ENCOUNTER — Other Ambulatory Visit: Payer: Self-pay

## 2018-08-01 NOTE — Patient Outreach (Signed)
[facility]) Care Management  08/01/2018  Tina [name] June 06, 1950 [id number]   EMMI-Heart Failure RED ON EMMI ALERT Day #17 Date:07/27/2018 Red Alert Reason:Weight 193 lbs  EMMI-Heart Failure RED ON EMMI ALERT Day #19 Date:07/29/2018 Red Alert Reason:Weight 194 lbs  Outreach attempt:no answer. Unable to leave a message.   Plan: RN CM willwait return call,if no return call will close case.   Bary Leriche, RN, MSN [facility] Care Management Care Management Coordinator Direct Line [phone] Cell [phone] Toll Free: [phone]  Fax: [phone]

## 2018-08-02 ENCOUNTER — Encounter: Payer: Self-pay | Admitting: Student

## 2018-08-02 ENCOUNTER — Ambulatory Visit (INDEPENDENT_AMBULATORY_CARE_PROVIDER_SITE_OTHER): Payer: Medicare HMO | Admitting: Student

## 2018-08-02 ENCOUNTER — Other Ambulatory Visit: Payer: Self-pay

## 2018-08-02 VITALS — BP 128/78 | HR 90 | Ht 59.0 in | Wt 198.0 lb

## 2018-08-02 DIAGNOSIS — I10 Essential (primary) hypertension: Secondary | ICD-10-CM

## 2018-08-02 DIAGNOSIS — I4819 Other persistent atrial fibrillation: Secondary | ICD-10-CM

## 2018-08-02 DIAGNOSIS — E785 Hyperlipidemia, unspecified: Secondary | ICD-10-CM

## 2018-08-02 DIAGNOSIS — G4733 Obstructive sleep apnea (adult) (pediatric): Secondary | ICD-10-CM

## 2018-08-02 DIAGNOSIS — I6523 Occlusion and stenosis of bilateral carotid arteries: Secondary | ICD-10-CM | POA: Diagnosis not present

## 2018-08-02 DIAGNOSIS — I5032 Chronic diastolic (congestive) heart failure: Secondary | ICD-10-CM | POA: Diagnosis not present

## 2018-08-02 MED ORDER — FUROSEMIDE 40 MG PO TABS: 40.0000 mg | ORAL_TABLET | Freq: Two times a day (BID) | ORAL | 3 refills | Status: DC

## 2018-08-02 NOTE — Progress Notes (Signed)
Cardiology Office Note    Date:  08/02/2018   ID:  Tina [name] Ayer, DOB Nov 11, 1950, MRN [id number]  PCP:  Salley Scarleturham, Kawanta F, MD  Cardiologist: Prentice DockerSuresh Koneswaran, MD    Chief Complaint  Patient presents with   Hospitalization Follow-up    History of Present Illness:    Tina [name] Naji is a 68 y.o. female with past medical history of PAF (diagnosed in 05/2017), chronic diastolic CHF, HTN, HLD, Type 2 DM, OSA, prior vertebral artery dissection (in 02/2017), carotid artery stenosis and prior CVA who presents to the office today for hospital follow-up.   She was last examined by Dr. Purvis SheffieldKoneswaran in 04/2018 and reported worsening dyspnea and lower extremity edema. Weight had increased to 204 lbs by the office scales and she had currently been taking Lasix 40 mg twice daily. She was instructed to take Lasix 80 mg twice daily for 4 days and then reduce back to her baseline dose.  In the interim, she was admitted to [facility] from 07/04/2018 to 07/07/2018 for evaluation of worsening dyspnea secondary to an acute on chronic diastolic CHF exacerbation and atrial fibrillation with RVR. BNP was elevated to 386 at that time. Repeat echocardiogram showed a preserved EF of 55 to 60% with normal RV function and moderate tricuspid regurgitation. She was not on any AV nodal blocking agents prior to admission and was started on Lopressor which was further titrated to 50 mg twice daily prior to discharge. She diuresed well with IV Lasix and this was also transitioned back to PO Lasix 40 mg twice daily at discharge (weight 196 lbs). DCCV was going to be performed during admission but she consumed breakfast and given that she was asymptomatic with rate control at that time, this was not rescheduled. It was recommended to consider DCCV as an outpatient if she developed recurrent symptoms.  In talking with the patient and her family friend today, she reports still being symptomatic since hospital discharge. Says that  her breathing has improved but has not yet returned to baseline. She experiences shortness of breath with minimal activity and reports associated palpitations at that time. Denies any specific chest discomfort. She does report having intermittent orthopnea and takes an extra Lasix tablet as needed with improvement in her symptoms. Says that weight has overall been stable on her home scales.   She reports good compliance with her current medication regimen including Eliquis for anticoagulation. She does believe she might have missed an evening dose 2 weeks ago but reports compliance since. She does not have a blood pressure cuff at home and has no way of checking her HR or BP regularly.    Past Medical History:  Diagnosis Date   Atrial fibrillation (HCC)    a. diagnosed in 05/2017 --> started on Eliquis for anticoagulation   Carotid artery stenosis    CHF (congestive heart failure) (HCC)    Depression    Diabetes mellitus    Fibromyalgia    GERD (gastroesophageal reflux disease)    Hyperlipidemia    Hypertension    Stroke (HCC)    Superficial thrombophlebitis    right leg    Past Surgical History:  Procedure Laterality Date   CHOLECYSTECTOMY  1991   HERNIA REPAIR  approx 1969   NECK SURGERY  1998   TONSILLECTOMY AND ADENOIDECTOMY  1966    Current Medications: Outpatient Medications Prior to Visit  Medication Sig Dispense Refill   albuterol (PROVENTIL HFA;VENTOLIN HFA) 108 (90 Base) MCG/ACT inhaler Inhale 2  puffs into the lungs every 6 (six) hours as needed for wheezing or shortness of breath. 1 Inhaler 0   apixaban (ELIQUIS) 5 MG TABS tablet Take 1 tablet (5 mg total) by mouth 2 (two) times daily. 180 tablet 3   aspirin EC 81 MG EC tablet Take 1 tablet (81 mg total) by mouth daily. (Patient taking differently: Take 81 mg by mouth at bedtime. ) 30 tablet 4   benzonatate (TESSALON) 100 MG capsule Take 1 capsule (100 mg total) by mouth 3 (three) times daily as  needed for cough. 20 capsule 0   cetirizine (ZYRTEC) 10 MG tablet Take 10 mg by mouth daily.     clotrimazole-betamethasone (LOTRISONE) cream Apply 1 application topically 2 (two) times daily. (Patient taking differently: Apply 1 application topically daily as needed (itching). ) 60 g 1   fenofibrate 54 MG tablet Take 1 tablet (54 mg total) by mouth daily. 90 tablet 1   furosemide (LASIX) 40 MG tablet Take 1 tablet (40 mg total) by mouth 2 (two) times daily. (Patient taking differently: Take 80 mg by mouth daily. )     gabapentin (NEURONTIN) 100 MG capsule Take 1 capsule (100 mg total) by mouth at bedtime. 30 capsule 3   HYDROcodone-acetaminophen (NORCO) 5-325 MG tablet Take 1 tablet by mouth 2 (two) times daily as needed for moderate pain. 30 tablet 0   Insulin Glargine (BASAGLAR KWIKPEN) 100 UNIT/ML SOPN Inject 1.1 mLs (110 Units total) into the skin every morning. (Patient taking differently: Inject 110 Units into the skin at bedtime. ) 33 mL 1   ipratropium (ATROVENT) 0.02 % nebulizer solution Take 2.5 mLs (0.5 mg total) by nebulization every 4 (four) hours as needed for wheezing or shortness of breath. 75 mL 3   ipratropium (ATROVENT) 0.06 % nasal spray Place 2 sprays into both nostrils 4 (four) times daily as needed for rhinitis. 15 mL 3   losartan (COZAAR) 25 MG tablet Take 1 tablet (25 mg total) by mouth daily. 30 tablet 2   Menthol, Topical Analgesic, (BENGAY EX) Apply 1 application topically as needed (to painful sites).     metoprolol tartrate (LOPRESSOR) 50 MG tablet Take 1 tablet (50 mg total) by mouth 2 (two) times daily. 60 tablet 2   montelukast (SINGULAIR) 10 MG tablet 10MG  BY MOUTH ONCE DAILY (Patient taking differently: Take 10 mg by mouth at bedtime. ) 90 tablet 3   nystatin (MYCOSTATIN/NYSTOP) powder Apply topically 4 (four) times daily. (Patient taking differently: Apply 1 g topically 4 (four) times daily as needed (rash). ) 180 g 2   Omega-3 Fatty Acids (FISH OIL)  1000 MG CAPS Take 2 capsules (2,000 mg total) by mouth 2 (two) times daily. (Patient taking differently: Take 1 capsule by mouth at bedtime. ) 360 capsule 3   pantoprazole (PROTONIX) 40 MG tablet Take 1 tablet (40 mg total) by mouth daily. 90 tablet 3   PARoxetine (PAXIL) 40 MG tablet 40MG  BY MOUTH ONCE DAILY (Patient taking differently: Take 40 mg by mouth daily. ) 90 tablet 3   potassium chloride SA (K-DUR,KLOR-CON) 20 MEQ tablet Take 1 tablet (20 mEq total) by mouth daily. 30 tablet 6   rosuvastatin (CRESTOR) 40 MG tablet Take 1 tablet (40 mg total) by mouth daily. 90 tablet 3   TRUEPLUS INSULIN SYRINGE 31G X 5/16" 1 ML MISC USE AS DIRECTED TO INJECT (Patient taking differently: as directed. ) 100 each 2   No facility-administered medications prior to visit.  Allergies:   Cefuroxime axetil; Fentanyl and related; Niaspan [niacin er]; Sulfa antibiotics; Vasotec [enalapril]; Welchol [colesevelam hcl]; Lyrica [pregabalin]; and Penicillins   Social History   Socioeconomic History   Marital status: Married    Spouse name: Not on file   Number of children: Not on file   Years of education: Not on file   Highest education level: Not on file  Occupational History   Not on file  Social Needs   Financial resource strain: Not hard at all   Food insecurity:    Worry: Never true    Inability: Never true   Transportation needs:    Medical: No    Non-medical: No  Tobacco Use   Smoking status: Former Smoker    Types: Cigarettes   Smokeless tobacco: Never Used  Substance and Sexual Activity   Alcohol use: No   Drug use: No   Sexual activity: Not Currently  Lifestyle   Physical activity:    Days per week: Not on file    Minutes per session: Not on file   Stress: Not on file  Relationships   Social connections:    Talks on phone: Not on file    Gets together: Not on file    Attends religious service: Not on file    Active member of club or organization: Not on  file    Attends meetings of clubs or organizations: Not on file    Relationship status: Not on file  Other Topics Concern   Not on file  Social History Narrative   Not on file     Family History:  The patient's family history includes Diabetes in her daughter and maternal grandmother; Heart disease in her mother and sister; Hyperlipidemia in her mother and sister; Hypertension in her mother and sister; Stroke in her sister.   Review of Systems:   Please see the history of present illness.     General:  No chills, fever, night sweats or weight changes.  Cardiovascular:  No chest pain, palpitations, paroxysmal nocturnal dyspnea. Positive for palpitations and dyspnea on exertion.  Dermatological: No rash, lesions/masses Respiratory: No cough, dyspnea Urologic: No hematuria, dysuria Abdominal:   No nausea, vomiting, diarrhea, bright red blood per rectum, melena, or hematemesis Neurologic:  No visual changes, wkns, changes in mental status. All other systems reviewed and are otherwise negative except as noted above.   Physical Exam:    VS:  BP 128/78 (BP Location: Right Arm)    Pulse 90    Ht  (1.499 m)    Wt 198 lb (89.8 kg)    SpO2 96%    BMI 39.99 kg/m    General: Well developed, well nourished Caucasian female appearing in no acute distress. Head: Normocephalic, atraumatic, sclera non-icteric, no xanthomas, nares are without discharge.  Neck: No carotid bruits. JVD not elevated.  Lungs: Respirations regular and unlabored, mild rales along right base. Heart: Irregularly irregular. No S3 or S4.  No murmur, no rubs, or gallops appreciated. Abdomen: Soft, non-tender, non-distended with normoactive bowel sounds. No hepatomegaly. No rebound/guarding. No obvious abdominal masses. Msk:  Strength and tone appear normal for age. No joint deformities or effusions. Extremities: No clubbing or cyanosis. No lower extremity edema.  Distal pedal pulses are 2+ bilaterally. Neuro: Alert and  oriented X 3. Moves all extremities spontaneously. No focal deficits noted. Psych:  Responds to questions appropriately with a normal affect. Skin: No rashes or lesions noted  Wt Readings from Last 3 Encounters:  08/02/18 198 lb (89.8 kg)  07/18/18 196 lb (88.9 kg)  07/07/18 193 lb 14.4 oz (88 kg)     Studies/Labs Reviewed:   EKG:  EKG is not ordered today.  Recent Labs: 03/02/2018: ALT 17 07/04/2018: B Natriuretic Peptide 386.9; Hemoglobin 12.8; Platelets 188 07/05/2018: Magnesium 1.9 07/18/2018: BUN 19; Creat 1.01; Potassium 4.2; Sodium 137   Lipid Panel    Component Value Date/Time   CHOL 181 10/16/2017 0411   TRIG 253 ([name]) 10/16/2017 0411   HDL 43 10/16/2017 0411   CHOLHDL 4.2 10/16/2017 0411   VLDL 51 ([name]) 10/16/2017 0411   LDLCALC 87 10/16/2017 0411   LDLCALC 121 ([name]) 08/17/2017 1022    Additional studies/ records that were reviewed today include:   Echocardiogram: 07/05/2018 IMPRESSIONS   1. The left ventricle has normal systolic function, with an ejection fraction of 55-60%. The cavity size was normal. Left ventricular diastology could not be evaluated secondary to atrial fibrillation.  2. The right ventricle has normal systolic function. The cavity was normal. There is no increase in right ventricular wall thickness.  3. Right atrial size was mildly dilated.  4. The mitral valve is normal in structure.  5. The tricuspid valve is normal in structure. Tricuspid valve regurgitation is moderate.  6. The aortic valve is normal in structure.  7. Right atrial pressure is estimated at 3 mmHg.  8. The interatrial septum was not assessed.   Assessment:    1. Chronic diastolic congestive heart failure (HCC)   2. Persistent atrial fibrillation   3. Essential hypertension   4. Hyperlipidemia LDL goal <70   5. Bilateral carotid artery stenosis   6. OSA (obstructive sleep apnea)      Plan:   In order of problems listed above:  1. Chronic Diastolic CHF - Was  recently admitted for a CHF exacerbation and reports that her breathing has improved but does not feel back to baseline as of yet. She notices this mostly with activity and notes associated palpitations at that time. Does have intermittent orthopnea and she does have rales along her right base during today's visit. She is currently taking Lasix 40 mg twice daily and I recommended she take  in AM/40mg  in PM for the next 5 days then resume her normal dose. Will take additional K+ supplementation for the next 5 days as well. Recent labs by her PCP showed that creatinine was stable at 1.01 and K+ was 4.2.  2. Persistent Atrial Fibrillation - By review of notes she was planning to undergo a DCCV while admitted but ate food that morning due to concerns for hypoglycemia. She reports still having dyspnea on exertion with associated palpitations. By review of prior EKG's, it appears she has actually been in atrial fibrillation since 09/2017. Recent echocardiogram did show the left atrium was normal in size and the right atrium was mildly dilated. Reviewed the procedure with the patient and her family today and she does wish to proceed with a DCCV. Risks and benefits reviewed. She requests that this be with Dr. Purvis Sheffield if possible. I reviewed with her that we cannot guarantee how long she would be in normal sinus rhythm following this if she even converts. She does wish to try the procedure. If quick recurrence of her arrhythmia, would consider referral to EP for possible antiarrhythmic therapy. - Remains on Lopressor 50 mg twice daily for rate-control along with Eliquis 5 mg twice daily for anticoagulation.  3. HTN - BP is well controlled at 128/78  during today's visit. - Continue Losartan 25 mg daily and Lopressor 50 mg twice daily.  4. HLD - Followed by PCP. Goal LDL is less than 70 in the setting of known CAD. She remains on Crestor 40 mg daily.  5. Carotid Artery Stenosis - Doppler studies in 09/2017  showed 40 to 59% RICA stenosis and 1 to 39% LICA stenosis. Would recommend repeat imaging next year. Remains on ASA (continued by Neurology), Eliquis, and statin therapy.   6. OSA - she has already been referred for a sleep study by her PCP. She would greatly benefit from this in the setting of her CHF and atrial fibrillation.  Medication Adjustments/Labs and Tests Ordered: Current medicines are reviewed at length with the patient today.  Concerns regarding medicines are outlined above.  Medication changes, Labs and Tests ordered today are listed in the Patient Instructions below. Patient Instructions  Medication Instructions:  Your physician has recommended you make the following change in your medication:  Increase Lasix to 80 mg in the morning and 40 mg in the evening and take an extra Potassium for the next 5 Days. Then resume Lasix 40 mg two times daily and Potassium 20 meq daily.   If you need a refill on your cardiac medications before your next appointment, please call your pharmacy.   Lab work: NONE   If you have labs (blood work) drawn today and your tests are completely normal, you will receive your results only by:  MyChart Message (if you have MyChart) OR  A paper copy in the mail If you have any lab test that is abnormal or we need to change your treatment, we will call you to review the results.  Testing/Procedures: Your physician has recommended that you have a Cardioversion (DCCV). Electrical Cardioversion uses a jolt of electricity to your heart either through paddles or wired patches attached to your chest. This is a controlled, usually prescheduled, procedure. Defibrillation is done under light anesthesia in the hospital, and you usually go home the day of the procedure. This is done to get your heart back into a normal rhythm. You are not awake for the procedure. Please see the instruction sheet given to you today.    Follow-Up: At [facility], you and your  health needs are our priority.  As part of our continuing mission to provide you with exceptional heart care, we have created designated Provider Care Teams.  These Care Teams include your primary Cardiologist (physician) and Advanced Practice Providers (APPs -  Physician Assistants and Nurse Practitioners) who all work together to provide you with the care you need, when you need it.    Any Other Special Instructions Will Be Listed Below (If Applicable). Thank you for choosing [facility]!     Signed, Ellsworth Lennox, PA-C  08/02/2018 7:59 PM    [facility] [address] [place], [place] [address] Phone: [phone] Fax: [phone]

## 2018-08-02 NOTE — Patient Instructions (Addendum)
Medication Instructions:  Your physician has recommended you make the following change in your medication:  Increase Lasix to 80 mg in the morning and 40 mg in the evening and take an extra Potassium for the next 5 Days. Then resume Lasix 40 mg two times daily and Potassium 20 meq daily.   If you need a refill on your cardiac medications before your next appointment, please call your pharmacy.   Lab work: NONE   If you have labs (blood work) drawn today and your tests are completely normal, you will receive your results only by: Marland Kitchen MyChart Message (if you have MyChart) OR . A paper copy in the mail If you have any lab test that is abnormal or we need to change your treatment, we will call you to review the results.  Testing/Procedures: Your physician has recommended that you have a Cardioversion (DCCV). Electrical Cardioversion uses a jolt of electricity to your heart either through paddles or wired patches attached to your chest. This is a controlled, usually prescheduled, procedure. Defibrillation is done under light anesthesia in the hospital, and you usually go home the day of the procedure. This is done to get your heart back into a normal rhythm. You are not awake for the procedure. Please see the instruction sheet given to you today.    Follow-Up: At [facility], you and your health needs are our priority.  As part of our continuing mission to provide you with exceptional heart care, we have created designated Provider Care Teams.  These Care Teams include your primary Cardiologist (physician) and Advanced Practice Providers (APPs -  Physician Assistants and Nurse Practitioners) who all work together to provide you with the care you need, when you need it. .   Any Other Special Instructions Will Be Listed Below (If Applicable). Thank you for choosing [facility]!

## 2018-08-03 ENCOUNTER — Other Ambulatory Visit: Payer: Self-pay

## 2018-08-03 NOTE — Patient Outreach (Signed)
[facility]) Care Management  08/03/2018  Tina [name] 06/07/1950 [id number]   EMMI- Heart Failure RED ON EMMI ALERT Day # 23 Date:  08/02/2018 Red Alert Reason:  Weighed themselves? No  Outreach attempt: spoke with patient.  She is able to verify HIPAA.  Patient states that she has weighed and her weight today was 195.2 lbs.  Discussed with patient her weight gain.  She states that she saw the cardiologist on yesterday and she is scheduled to have her heart shocked and she had to take 2 fluid pills yesterday and is taking potassium.  She states she was up all night using the bathroom.  She denies any shortness of breath.  Patient states she has follow up after her 08/24/2018 procedure to shock her heart. Discussed with patient perimeters and when to notify physician.  She verbalized understanding and declines any further needs.    Advised patient that she would continue to receive automated calls and if there is an issue that a nurse would call.  She verbalized understanding.   Plan: RN CM will close case.    Bary Leriche, RN, MSN [facility] Care Management Care Management Coordinator Direct Line [phone] Toll Free: [phone]  Fax: [phone]

## 2018-08-04 ENCOUNTER — Other Ambulatory Visit: Payer: Self-pay

## 2018-08-04 NOTE — Patient Outreach (Signed)
[facility]) Care Management  08/04/2018  ABILENE LANZI [birth year] [id number]   EMMI- Heart Failure RED ON EMMI ALERT Day # 24 Date: 08/03/2018 Red Alert Reason:  Yes  Yes  Yes  Yes  Yes    EMMI issues addressed with patient on 08/03/2018 therefore no need for outreach.  No needs identified. Case closed.   Bary Leriche, RN, MSN [facility] Care Management Care Management Coordinator Direct Line [phone] Toll Free: [phone]  Fax: [phone]

## 2018-08-14 ENCOUNTER — Other Ambulatory Visit: Payer: Self-pay

## 2018-08-14 NOTE — Patient Outreach (Addendum)
[facility]) Care Management  08/14/2018  Tina [name] September 07, 1950 [id number]   EMMI- Herat Failure RED ON EMMI ALERT Day # 32 Date: 08/11/2018 Red Alert Reason:  New/worsening problems? Yes  New/worsening shortness of breath? Yes    EMMI- Heart Failure RED ON EMMI ALERT Day # 33 Date: 08/12/2018 Red Alert Reason:  Weight -191lbs  Outreach attempt: spoke with patient. She is able to verify HIPAA.  She states that she has some shortness of breath but states that she is doing better.  She states that she is up several times during the night to urinate due to taking her fluid pills. She states that she was bought prepared meal and she thinks that was too much sodium.  Discussed with patient how prepared foods have lots of sodium and named other foods that have quite a bit of sodium too.  She verbalized understanding.  Patient weighed herself while CM on the phone.  She states that her weight today was 189 lbs.  Discussed with patient signs of worsening heart failure and when to notify physician. She verbalized understanding and is appreciative of call.  Patient denies any current needs or concerns.    Plan: RN CM will close case.    Bary Leriche, RN, MSN [facility] Care Management Care Management Coordinator Direct Line [phone] Toll Free: [phone]  Fax: [phone]

## 2018-08-24 ENCOUNTER — Other Ambulatory Visit: Payer: Self-pay

## 2018-08-24 NOTE — Patient Outreach (Signed)
[facility]) Care Management  08/24/2018  GLORIS BARNDT 1951/03/03 [id number]   EMMI- Heart Failure RED ON EMMI ALERT Day # 44 Date: 08/23/2018 Red Alert Reason:  Any new problems? Yes  New/worsening problems? Yes  New/worsening shortness of breath? Yes  Tired/fatigued? Yes     Outreach attempt: spoke with patient.  She is able to verify HIPAA.  She states that the automated system is not hearing her and entering the wrong information.  Patient states she is doing ok.  She reports some times having to take an extra lasix as per doctors orders.  She states that she is watching her salt intake.  She is staying active by frequently walking around her apartment. Patient encouraged to continue.  Patient denies any food insecurities at this time.  Patient denies any further needs.     Plan: RN CM will close case.    Bary Leriche, RN, MSN [facility] Care Management Care Management Coordinator Direct Line [phone] Toll Free: [phone]  Fax: [phone]

## 2018-09-01 ENCOUNTER — Telehealth: Payer: Self-pay | Admitting: *Deleted

## 2018-09-01 NOTE — Telephone Encounter (Signed)
Contacted patient for review of chart for virtual visit (telephone) on 09/05/2018 with Dr. Purvis Sheffield at 1:40 pm.        Medications reviewed.  No recent labs or hospital visits.  Patient reminded to have weight ready before the phone call begins.  She does not have BP monitor at home.  The patient verbally consented for a telehealth phone visit with [facility] and understands that his/her insurance company will be billed for the encounter.

## 2018-09-05 ENCOUNTER — Encounter

## 2018-09-05 ENCOUNTER — Telehealth (INDEPENDENT_AMBULATORY_CARE_PROVIDER_SITE_OTHER): Payer: Medicare HMO | Admitting: Cardiovascular Disease

## 2018-09-05 ENCOUNTER — Encounter: Payer: Self-pay | Admitting: Cardiovascular Disease

## 2018-09-05 VITALS — Ht 59.0 in | Wt 188.6 lb

## 2018-09-05 DIAGNOSIS — I5032 Chronic diastolic (congestive) heart failure: Secondary | ICD-10-CM | POA: Diagnosis not present

## 2018-09-05 DIAGNOSIS — I110 Hypertensive heart disease with heart failure: Secondary | ICD-10-CM

## 2018-09-05 DIAGNOSIS — E785 Hyperlipidemia, unspecified: Secondary | ICD-10-CM | POA: Diagnosis not present

## 2018-09-05 DIAGNOSIS — I6523 Occlusion and stenosis of bilateral carotid arteries: Secondary | ICD-10-CM

## 2018-09-05 DIAGNOSIS — G4733 Obstructive sleep apnea (adult) (pediatric): Secondary | ICD-10-CM

## 2018-09-05 DIAGNOSIS — I4819 Other persistent atrial fibrillation: Secondary | ICD-10-CM | POA: Diagnosis not present

## 2018-09-05 DIAGNOSIS — I10 Essential (primary) hypertension: Secondary | ICD-10-CM

## 2018-09-05 NOTE — Patient Instructions (Addendum)
Medication Instructions:   Your physician has recommended you make the following change in your medication:   Take and extra 40 mg furosemide for weight gain of 3 lbs in a 24 hour period.  Continue all other medications the same  Labwork:  NONE  Testing/Procedures:  NONE  Follow-Up:  Your physician recommends that you schedule a follow-up appointment in: 2 months with [place].  Any Other Special Instructions Will Be Listed Below (If Applicable). Your physician recommends that you weigh, daily, at the same time every day, and in the same amount of clothing. Please record your daily weights.  If you need a refill on your cardiac medications before your next appointment, please call your pharmacy.

## 2018-09-05 NOTE — Progress Notes (Signed)
Virtual Visit via Telephone Note   This visit type was conducted due to national recommendations for restrictions regarding the COVID-19 Pandemic (e.g. social distancing) in an effort to limit this patient's exposure and mitigate transmission in our community.  Due to her co-morbid illnesses, this patient is at least at moderate risk for complications without adequate follow up.  This format is felt to be most appropriate for this patient at this time.  The patient did not have access to video technology/had technical difficulties with video requiring transitioning to audio format only (telephone).  All issues noted in this document were discussed and addressed.  No physical exam could be performed with this format.  Please refer to the patient's chart for her  consent to telehealth for [facility].   Evaluation Performed:  Follow-up visit  Date:  09/05/2018   ID:  Tina [name], DOB 1950/11/09, MRN [id number]  Patient Location: Home  Provider Location: Home  PCP:  Salley Scarlet, MD  Cardiologist:  Prentice Docker, MD  Electrophysiologist:  None   Chief Complaint:  Diastolic HF, a fib  History of Present Illness:    Tina [name] is a 68 y.o. female who presents via audio/video conferencing for a telehealth visit today.    She was hospitalized in February 2020 for decompensated diastolic heart failure and rapid atrial fibrillation. LVEF normal by echocardiogram. DCCV was not pursued as she had eaten breakfast but it was recommended to reconsider this if she had recurrent symptoms.  She saw B. Strader PA-C in follow up on 08/02/18 and complained of shortness of breath and palpitations.   Says she feels tired all the time. Wants to sleep. She did not get a sleep study. Continues to have exertional dyspnea which hasn't changed in past month. Doesn't own a BP cuff. Sometimes able to clean her apartment without significant symptoms. She does note her weight has gone down  considerably after increasing Lasix for a few days after last office visit.  The patient does not have symptoms concerning for COVID-19 infection (fever, chills, cough, or new shortness of breath).    Past Medical History:  Diagnosis Date   Atrial fibrillation (HCC)    a. diagnosed in 05/2017 --> started on Eliquis for anticoagulation   Carotid artery stenosis    CHF (congestive heart failure) (HCC)    Depression    Diabetes mellitus    Fibromyalgia    GERD (gastroesophageal reflux disease)    Hyperlipidemia    Hypertension    Stroke (HCC)    Superficial thrombophlebitis    right leg   Past Surgical History:  Procedure Laterality Date   CHOLECYSTECTOMY  1991   HERNIA REPAIR  approx 1969   NECK SURGERY  1998   TONSILLECTOMY AND ADENOIDECTOMY  1966     Current Meds  Medication Sig   albuterol (PROVENTIL HFA;VENTOLIN HFA) 108 (90 Base) MCG/ACT inhaler Inhale 2 puffs into the lungs every 6 (six) hours as needed for wheezing or shortness of breath.   apixaban (ELIQUIS) 5 MG TABS tablet Take 1 tablet (5 mg total) by mouth 2 (two) times daily.   aspirin EC 81 MG EC tablet Take 1 tablet (81 mg total) by mouth daily. (Patient taking differently: Take 81 mg by mouth at bedtime. )   benzonatate (TESSALON) 100 MG capsule Take 1 capsule (100 mg total) by mouth 3 (three) times daily as needed for cough.   cetirizine (ZYRTEC) 10 MG tablet Take 10 mg by mouth daily.  clotrimazole-betamethasone (LOTRISONE) cream Apply 1 application topically 2 (two) times daily. (Patient taking differently: Apply 1 application topically daily as needed (itching). )   fenofibrate 54 MG tablet Take 1 tablet (54 mg total) by mouth daily.   furosemide (LASIX) 40 MG tablet Take 1 tablet (40 mg total) by mouth 2 (two) times daily. (Patient taking differently: Take 80 mg by mouth daily. )   furosemide (LASIX) 40 MG tablet Take 1 tablet (40 mg total) by mouth 2 (two) times daily.   gabapentin  (NEURONTIN) 100 MG capsule Take 1 capsule (100 mg total) by mouth at bedtime.   HYDROcodone-acetaminophen (NORCO) 5-325 MG tablet Take 1 tablet by mouth 2 (two) times daily as needed for moderate pain.   Insulin Glargine (BASAGLAR KWIKPEN) 100 UNIT/ML SOPN Inject 1.1 mLs (110 Units total) into the skin every morning. (Patient taking differently: Inject 110 Units into the skin at bedtime. )   ipratropium (ATROVENT) 0.02 % nebulizer solution Take 2.5 mLs (0.5 mg total) by nebulization every 4 (four) hours as needed for wheezing or shortness of breath.   ipratropium (ATROVENT) 0.06 % nasal spray Place 2 sprays into both nostrils 4 (four) times daily as needed for rhinitis.   losartan (COZAAR) 25 MG tablet Take 1 tablet (25 mg total) by mouth daily.   Menthol, Topical Analgesic, (BENGAY EX) Apply 1 application topically as needed (to painful sites).   metoprolol tartrate (LOPRESSOR) 50 MG tablet Take 1 tablet (50 mg total) by mouth 2 (two) times daily.   montelukast (SINGULAIR) 10 MG tablet 10MG  BY MOUTH ONCE DAILY (Patient taking differently: Take 10 mg by mouth at bedtime. )   nystatin (MYCOSTATIN/NYSTOP) powder Apply topically 4 (four) times daily. (Patient taking differently: Apply 1 g topically 4 (four) times daily as needed (rash). )   Omega-3 Fatty Acids (FISH OIL) 1000 MG CAPS Take 2 capsules (2,000 mg total) by mouth 2 (two) times daily. (Patient taking differently: Take 1 capsule by mouth at bedtime. )   pantoprazole (PROTONIX) 40 MG tablet Take 1 tablet (40 mg total) by mouth daily.   PARoxetine (PAXIL) 40 MG tablet 40MG  BY MOUTH ONCE DAILY (Patient taking differently: Take 40 mg by mouth daily. )   potassium chloride SA (K-DUR,KLOR-CON) 20 MEQ tablet Take 1 tablet (20 mEq total) by mouth daily.   rosuvastatin (CRESTOR) 40 MG tablet Take 1 tablet (40 mg total) by mouth daily.   TRUEPLUS INSULIN SYRINGE 31G X 5/16" 1 ML MISC USE AS DIRECTED TO INJECT (Patient taking differently:  as directed. )     Allergies:   Cefuroxime axetil; Fentanyl and related; Niaspan [niacin er]; Sulfa antibiotics; Vasotec [enalapril]; Welchol [colesevelam hcl]; Lyrica [pregabalin]; and Penicillins   Social History   Tobacco Use   Smoking status: Former Smoker    Types: Cigarettes   Smokeless tobacco: Never Used  Substance Use Topics   Alcohol use: No   Drug use: No     Family Hx: The patient's family history includes Diabetes in her daughter and maternal grandmother; Heart disease in her mother and sister; Hyperlipidemia in her mother and sister; Hypertension in her mother and sister; Stroke in her sister.  ROS:   Please see the history of present illness.     All other systems reviewed and are negative.   Prior CV studies:   The following studies were reviewed today:  Echo reviewed above from 06/2018  Labs/Other Tests and Data Reviewed:    EKG:  No ECG reviewed.  Recent Labs:  03/02/2018: ALT 17 07/04/2018: B Natriuretic Peptide 386.9; Hemoglobin 12.8; Platelets 188 07/05/2018: Magnesium 1.9 07/18/2018: BUN 19; Creat 1.01; Potassium 4.2; Sodium 137   Recent Lipid Panel Lab Results  Component Value Date/Time   CHOL 181 10/16/2017 04:11 AM   TRIG 253 (H) 10/16/2017 04:11 AM   HDL 43 10/16/2017 04:11 AM   CHOLHDL 4.2 10/16/2017 04:11 AM   LDLCALC 87 10/16/2017 04:11 AM   LDLCALC 121 (H) 08/17/2017 10:22 AM    Wt Readings from Last 3 Encounters:  09/05/18 188 lb 9.6 oz (85.5 kg)  08/02/18 198 lb (89.8 kg)  07/18/18 196 lb (88.9 kg)     Objective:    Vital Signs:  Ht  (1.499 m)    Wt 188 lb 9.6 oz (85.5 kg)    BMI 38.09 kg/m    Phone visit  ASSESSMENT & PLAN:    1. Chronic diastolic heart failure: Currently on Lasix 40 mg bid. Weight down 10 lbs since 08/02/18 (albeit different scales used). She has exertional dyspnea. I have instructed her to weigh daily and to take an extra 40 mg of Lasix with supplemental KCl should weight increase by 3 lbs in 24  hrs.  2. Persistent atrial fibrillation: On Lopressor 50 mg bid and Eliquis 5 mg bid. Had initially planned for DCCV but due to COVID19 pandemic, this was not pursued. Continues to have exertional dyspnea. I told her if I am able to control symptoms with meds for next 2 months, will pursue this strategy and have her return for office visit. If she is still symptomatic in June, will pursue DCCV.  3. HTN: Needs further monitoring. She does not own a BP cuff.  4. Hyperlipidemia: Continue Crestor.  5. Carotid artery stenosis: Doppler studies in 09/2017 showed 40 to 59% RICA stenosis and 1 to 39% LICA stenosis. Would recommend repeat imaging next year. Remains on ASA (continued by Neurology), Eliquis, and statin therapy.   6. OSA: Needs sleep study.    COVID-19 Education: The signs and symptoms of COVID-19 were discussed with the patient and how to seek care for testing (follow up with PCP or arrange E-visit).  The importance of social distancing was discussed today.  Time:   Today, I have spent 25 minutes with the patient with telehealth technology discussing the above problems.     Medication Adjustments/Labs and Tests Ordered: Current medicines are reviewed at length with the patient today.  Concerns regarding medicines are outlined above.   Tests Ordered: No orders of the defined types were placed in this encounter.   Medication Changes: No orders of the defined types were placed in this encounter.   Disposition:  Follow up in 2 month(s)  Signed, Prentice Docker, MD  09/05/2018 1:22 PM    [facility]

## 2018-09-11 ENCOUNTER — Other Ambulatory Visit: Payer: Self-pay | Admitting: Family Medicine

## 2018-10-02 ENCOUNTER — Telehealth: Payer: Self-pay | Admitting: *Deleted

## 2018-10-02 MED ORDER — CLINDAMYCIN HCL 300 MG PO CAPS: 300.0000 mg | ORAL_CAPSULE | Freq: Three times a day (TID) | ORAL | 0 refills | Status: DC

## 2018-10-02 NOTE — Telephone Encounter (Signed)
Received call from patient POA Kim.   Reports that patient has abscessed tooth that she has not been able to have removed d/t COIVD precautions.   Requested order for ABTx.   MD please advise.

## 2018-10-02 NOTE — Telephone Encounter (Signed)
Clindamycin for tooth Dentist office should be open this week, she can call and schedule an appt

## 2018-10-03 ENCOUNTER — Other Ambulatory Visit: Payer: Self-pay | Admitting: *Deleted

## 2018-10-03 MED ORDER — HYDROCODONE-ACETAMINOPHEN 5-325 MG PO TABS: 1.0000 | ORAL_TABLET | Freq: Two times a day (BID) | ORAL | 0 refills | Status: DC | PRN

## 2018-10-03 NOTE — Telephone Encounter (Signed)
Received call from patient HPOA, [name].   Requested refill on Hydrocodone/APAP for patient d/t tooth abscess.   Ok to refill??  Last office visit 07/18/2018.  Last refill 07/11/2018.

## 2018-10-03 NOTE — Telephone Encounter (Signed)
Call placed to patient and patient HPOA [name] made aware.

## 2018-10-03 NOTE — Telephone Encounter (Signed)
Call placed to patient HPOA, [name]. LMTRC.

## 2018-10-09 ENCOUNTER — Telehealth: Payer: Self-pay | Admitting: Family Medicine

## 2018-10-09 NOTE — Telephone Encounter (Signed)
She needs to call her cardiologist She can take extra dose of lasix like they suggested but needs to call today  Or go to ER if she cant breathe

## 2018-10-09 NOTE — Telephone Encounter (Signed)
Spoke with patient and instructed her on how to take medications. While talking to patient on the phone she sounded extremely winded and SOB. In the midst of talking I asked if it was her baseline for breathing she stated that it was not that she is always SOB but a little more than usual. States she weighed herself but she has not gained significant weight but feels that she needs to increase her fluid medications. I advised patient to call her cardiologist Dr. Dwyane Luo and patient asked if I would notify you of breathing. Also instructed patient that if SOB is significant than she needs to seek emergency care. Please advise?

## 2018-10-09 NOTE — Telephone Encounter (Signed)
Patient called in stating that the clindamycin that was given for her tooth abscess on the 11th is causing stomach upset and indigestion. Patient stated that she was taking it with food. I asked if she was taking it with a full glass of water and she stated yes. She would like to know if you could change the medication to something else. Please advise?

## 2018-10-09 NOTE — Telephone Encounter (Signed)
Spoke with patient and again instructed her to call Dr. Dwyane Luo or go to ER if having difficulty breathing. Patient stated she would call but she will not go to ER due to fear of cost of and hospital visit or admission.

## 2018-10-09 NOTE — Telephone Encounter (Signed)
She is allergic to the other medications we would typically use to treat dental abscess Sulfa, penicillin, ? cephlasporin Take with food, I am not sure how much she has left, was given script 1 week ago, for 7 days of clindamycin She can take twice a day with food to less GI upset

## 2018-10-11 ENCOUNTER — Telehealth: Payer: Self-pay | Admitting: Cardiovascular Disease

## 2018-10-11 NOTE — Telephone Encounter (Signed)
Pt called wanting to know if she's going to have her cardioversion, states she was supposed to have her heart shocked and hasn't heard anything

## 2018-10-11 NOTE — Telephone Encounter (Signed)
Informed patient of Dr. Caesar Chestnut instructions at last visit.    Per telehealth visit on 09/03/2018 -   . Persistent atrial fibrillation: On Lopressor 50 mg bid and Eliquis 5 mg bid. Had initially planned for DCCV but due to COVID19 pandemic, this was not pursued. Continues to have exertional dyspnea. I told her if I am able to control symptoms with meds for next 2 months, will pursue this strategy and have her return for office visit. If she is still symptomatic in June, will pursue DCCV.  Patient verbalized understanding.  She has follow up scheduled with Randall An in [place] office on 11/15/2018.  States she is doing about the same now.  She will call back if symptoms worsen before then.

## 2018-10-23 ENCOUNTER — Telehealth: Payer: Self-pay | Admitting: Cardiovascular Disease

## 2018-10-23 NOTE — Telephone Encounter (Signed)
What's her weight?

## 2018-10-23 NOTE — Telephone Encounter (Signed)
Patient's daughter states that since virtual visit w/ SK on 4/14, that patient has gotten worse and has had 3 falls. Please call to discuss options. Patient's daughter is wanting patient to be seen in office./ tg

## 2018-10-23 NOTE — Telephone Encounter (Signed)
Dr. Wyline Mood - please advise if willing to see patient tomorrow in office at 2:30.

## 2018-10-23 NOTE — Telephone Encounter (Signed)
She had fallen x 3.  Complains of severe dizziness, off balance really bad even with using walker.  Feet & legs swollen - has been having to use the extra Lasix & states that feet & legs still not going down.  Did not go to the ED either of those times.  Does not have a way to check BP at home.  No chest pain.  SOB seems to be worsening.  Does already have OV scheduled with BS 11/15/2018.

## 2018-10-23 NOTE — Telephone Encounter (Signed)
Weight actually down by 4 lbs since visit on 4/14. Could offer telehealth visit with APP this week. Otherwise, could schedule an in-person office visit with DOD in [place] either this week or next week. If symptoms continue to get worse, I would recommend an ED evaluation.

## 2018-10-23 NOTE — Telephone Encounter (Signed)
Weight is currently 184lb.

## 2018-10-24 ENCOUNTER — Encounter: Payer: Self-pay | Admitting: Cardiology

## 2018-10-24 ENCOUNTER — Other Ambulatory Visit: Payer: Self-pay

## 2018-10-24 ENCOUNTER — Ambulatory Visit (INDEPENDENT_AMBULATORY_CARE_PROVIDER_SITE_OTHER): Payer: Medicare HMO | Admitting: Cardiology

## 2018-10-24 VITALS — BP 143/84 | HR 94 | Temp 98.1°F | Ht 59.0 in | Wt 199.0 lb

## 2018-10-24 DIAGNOSIS — I5033 Acute on chronic diastolic (congestive) heart failure: Secondary | ICD-10-CM

## 2018-10-24 DIAGNOSIS — R0602 Shortness of breath: Secondary | ICD-10-CM | POA: Diagnosis not present

## 2018-10-24 MED ORDER — TORSEMIDE 20 MG PO TABS: 40.0000 mg | ORAL_TABLET | Freq: Two times a day (BID) | ORAL | 6 refills | Status: DC

## 2018-10-24 NOTE — Patient Instructions (Addendum)
Medication Instructions:   Stop Lasix (Furosemide).  Begin Torsemide 40mg  twice a day.  Continue all other medications.    Labwork:  BMET, Magnesium - orders enclosed.   Please do labs on Monday, 10/30/2018.  Office will contact with results via phone or letter.    Testing/Procedures: none  Follow-Up:  Keep already scheduled visit as scheduled for 11/15/2018.    Please call the office on Friday with your weights  Any Other Special Instructions Will Be Listed Below (If Applicable).  If you need a refill on your cardiac medications before your next appointment, please call your pharmacy.  =======================================================================  Daily Weight Record It is important to weigh yourself daily. To do this:  Make sure you use a reliable scale. Use the same scale each day.  Keep this daily weight chart near your scale.  Weigh yourself each morning at the same time.  Before weighing yourself: ? Take off your shoes. ? Make sure you are wearing the same amount of clothing each day.  Write down your weight in the spaces on the form.  Compare today's weight to yesterday's weight.    Bring this form with you to your follow-up visits with your health care provider. Call your health care provider if you have concerns about your weight, including rapid weight gain or loss. Date: ________ Weight: ____________________ Date: ________ Weight: ____________________ Date: ________ Weight: ____________________ Date: ________ Weight: ____________________ Date: ________ Weight: ____________________ Date: ________ Weight: ____________________ Date: ________ Weight: ____________________ Date: ________ Weight: ____________________ Date: ________ Weight: ____________________ Date: ________ Weight: ____________________ Date: ________ Weight: ____________________ Date: ________ Weight: ____________________ Date: ________ Weight: ____________________ Date: ________  Weight: ____________________ Date: ________ Weight: ____________________ Date: ________ Weight: ____________________ Date: ________ Weight: ____________________ Date: ________ Weight: ____________________ Date: ________ Weight: ____________________ Date: ________ Weight: ____________________ Date: ________ Weight: ____________________ Date: ________ Weight: ____________________ Date: ________ Weight: ____________________ Date: ________ Weight: ____________________ Date: ________ Weight: ____________________ Date: ________ Weight: ____________________ Date: ________ Weight: ____________________ Date: ________ Weight: ____________________ Date: ________ Weight: ____________________ Date: ________ Weight: ____________________ Date: ________ Weight: ____________________ Date: ________ Weight: ____________________ Date: ________ Weight: ____________________ Date: ________ Weight: ____________________ Date: ________ Weight: ____________________ Date: ________ Weight: ____________________ Date: ________ Weight: ____________________ Date: ________ Weight: ____________________ Date: ________ Weight: ____________________ Date: ________ Weight: ____________________ Date: ________ Weight: ____________________ Date: ________ Weight: ____________________ Date: ________ Weight: ____________________ Date: ________ Weight: ____________________ Date: ________ Weight: ____________________ Date: ________ Weight: ____________________ Date: ________ Weight: ____________________ Date: ________ Weight: ____________________ Date: ________ Weight: ____________________ Date: ________ Weight: ____________________ This information is not intended to replace advice given to you by your health care provider. Make sure you discuss any questions you have with your health care provider. Document Released: 07/22/2006 Document Revised: 05/09/2017 Document Reviewed: 05/09/2017 Elsevier Interactive Patient Education  2019 [employer].

## 2018-10-24 NOTE — Progress Notes (Signed)
Clinical Summary Ms. Puccini is a 68 y.o.female seen today as an add on patient for SOB, regular patient of Dr Purvis Sheffield  1. Acute on chronic diastolic HF - admission 06/2018 with CHF - 06/2018 echo LVEF 55-60%, cannot eval diastolic due to afib - at last visit 08/2018 weight down 10 lbs   - weight during 09/05/18 visit 188 lbs. Was 198 lbs in 07/2018. From 06/2018 discharge if accurate weight was 194 lbs - weight today is 199 lbs in our clinic (reported home weight was inaccurate, actually was 194 lbs and not 184 lbs) - - increased legs swelling. +SOB - has been taking lasix  bid since last visit - yesterday she took  of her lasix.    2. Persistent Afib - discussions about DCCV pending her ongoing symptoms - rate controlled, on eliquis.     Past Medical History:  Diagnosis Date  . Atrial fibrillation (HCC)    a. diagnosed in 05/2017 --> started on Eliquis for anticoagulation  . Carotid artery stenosis   . CHF (congestive heart failure) (HCC)   . Depression   . Diabetes mellitus   . Fibromyalgia   . GERD (gastroesophageal reflux disease)   . Hyperlipidemia   . Hypertension   . Stroke (HCC)   . Superficial thrombophlebitis    right leg     Allergies  Allergen Reactions  . Cefuroxime Axetil Other (See Comments)    Reaction not recalled by the patient  . Fentanyl And Related Other (See Comments)    Patch: unknown reaction by patient  . Niaspan [Niacin Er] Other (See Comments)    Burning all over  . Sulfa Antibiotics Nausea And Vomiting  . Vasotec [Enalapril] Cough  . Welchol [Colesevelam Hcl] Other (See Comments)    Muscle aches and joint pains  . Lyrica [Pregabalin] Rash    Blistering rash around same time as starting drug  . Penicillins Hives and Rash    Has patient had a PCN reaction causing immediate rash, facial/tongue/throat swelling, SOB or lightheadedness with hypotension: Yes Has patient had a PCN reaction causing severe rash involving mucus  membranes or skin necrosis: Yes Has patient had a PCN reaction that required hospitalization: No Has patient had a PCN reaction occurring within the last 10 years: No If all of the above answers are "NO", then may proceed with Cephalosporin use.      Current Outpatient Medications  Medication Sig Dispense Refill  . albuterol (PROVENTIL HFA;VENTOLIN HFA) 108 (90 Base) MCG/ACT inhaler Inhale 2 puffs into the lungs every 6 (six) hours as needed for wheezing or shortness of breath. 1 Inhaler 0  . apixaban (ELIQUIS) 5 MG TABS tablet Take 1 tablet (5 mg total) by mouth 2 (two) times daily. 180 tablet 3  . aspirin EC 81 MG EC tablet Take 1 tablet (81 mg total) by mouth daily. (Patient taking differently: Take 81 mg by mouth at bedtime. ) 30 tablet 4  . benzonatate (TESSALON) 100 MG capsule Take 1 capsule by mouth three times daily as needed for cough 20 capsule 0  . cetirizine (ZYRTEC) 10 MG tablet Take 10 mg by mouth daily.    . clindamycin (CLEOCIN) 300 MG capsule Take 1 capsule (300 mg total) by mouth 3 (three) times daily. 21 capsule 0  . clotrimazole-betamethasone (LOTRISONE) cream Apply 1 application topically 2 (two) times daily. (Patient taking differently: Apply 1 application topically daily as needed (itching). ) 60 g 1  . fenofibrate 54 MG tablet Take  1 tablet (54 mg total) by mouth daily. 90 tablet 1  . furosemide (LASIX) 40 MG tablet Take 1 tablet (40 mg total) by mouth 2 (two) times daily. (Patient taking differently: Take 80 mg by mouth daily. )    . furosemide (LASIX) 40 MG tablet Take 1 tablet (40 mg total) by mouth 2 (two) times daily. 180 tablet 3  . gabapentin (NEURONTIN) 100 MG capsule Take 1 capsule (100 mg total) by mouth at bedtime. 30 capsule 3  . HYDROcodone-acetaminophen (NORCO) 5-325 MG tablet Take 1 tablet by mouth 2 (two) times daily as needed for moderate pain. 30 tablet 0  . Insulin Glargine (BASAGLAR KWIKPEN) 100 UNIT/ML SOPN Inject 1.1 mLs (110 Units total) into the  skin every morning. (Patient taking differently: Inject 110 Units into the skin at bedtime. ) 33 mL 1  . ipratropium (ATROVENT) 0.02 % nebulizer solution Take 2.5 mLs (0.5 mg total) by nebulization every 4 (four) hours as needed for wheezing or shortness of breath. 75 mL 3  . ipratropium (ATROVENT) 0.06 % nasal spray Place 2 sprays into both nostrils 4 (four) times daily as needed for rhinitis. 15 mL 3  . losartan (COZAAR) 25 MG tablet Take 1 tablet (25 mg total) by mouth daily. 30 tablet 2  . Menthol, Topical Analgesic, (BENGAY EX) Apply 1 application topically as needed (to painful sites).    . metoprolol tartrate (LOPRESSOR) 50 MG tablet Take 1 tablet (50 mg total) by mouth 2 (two) times daily. 60 tablet 2  . montelukast (SINGULAIR) 10 MG tablet 10MG  BY MOUTH ONCE DAILY (Patient taking differently: Take 10 mg by mouth at bedtime. ) 90 tablet 3  . nystatin (MYCOSTATIN/NYSTOP) powder Apply topically 4 (four) times daily. (Patient taking differently: Apply 1 g topically 4 (four) times daily as needed (rash). ) 180 g 2  . Omega-3 Fatty Acids (FISH OIL) 1000 MG CAPS Take 2 capsules (2,000 mg total) by mouth 2 (two) times daily. (Patient taking differently: Take 1 capsule by mouth at bedtime. ) 360 capsule 3  . pantoprazole (PROTONIX) 40 MG tablet Take 1 tablet (40 mg total) by mouth daily. 90 tablet 3  . PARoxetine (PAXIL) 40 MG tablet TAKE 1 TABLET BY MOUTH IN THE MORNING 90 tablet 0  . potassium chloride SA (K-DUR,KLOR-CON) 20 MEQ tablet Take 1 tablet (20 mEq total) by mouth daily. 30 tablet 6  . rosuvastatin (CRESTOR) 40 MG tablet Take 1 tablet (40 mg total) by mouth daily. 90 tablet 3  . TRUEPLUS INSULIN SYRINGE 31G X 5/16" 1 ML MISC USE AS DIRECTED TO INJECT (Patient taking differently: as directed. ) 100 each 2   No current facility-administered medications for this visit.      Past Surgical History:  Procedure Laterality Date  . CHOLECYSTECTOMY  1991  . HERNIA REPAIR  approx 1969  . NECK  SURGERY  1998  . TONSILLECTOMY AND ADENOIDECTOMY  1966     Allergies  Allergen Reactions  . Cefuroxime Axetil Other (See Comments)    Reaction not recalled by the patient  . Fentanyl And Related Other (See Comments)    Patch: unknown reaction by patient  . Niaspan [Niacin Er] Other (See Comments)    Burning all over  . Sulfa Antibiotics Nausea And Vomiting  . Vasotec [Enalapril] Cough  . Welchol [Colesevelam Hcl] Other (See Comments)    Muscle aches and joint pains  . Lyrica [Pregabalin] Rash    Blistering rash around same time as starting drug  .  Penicillins Hives and Rash    Has patient had a PCN reaction causing immediate rash, facial/tongue/throat swelling, SOB or lightheadedness with hypotension: Yes Has patient had a PCN reaction causing severe rash involving mucus membranes or skin necrosis: Yes Has patient had a PCN reaction that required hospitalization: No Has patient had a PCN reaction occurring within the last 10 years: No If all of the above answers are "NO", then may proceed with Cephalosporin use.       Family History  Problem Relation Age of Onset  . Hypertension Mother   . Hyperlipidemia Mother   . Heart disease Mother   . Stroke Sister   . Hypertension Sister   . Hyperlipidemia Sister   . Heart disease Sister   . Diabetes Maternal Grandmother   . Diabetes Daughter      Social History [name] reports that she has quit smoking. Her smoking use included cigarettes. She has never used smokeless tobacco. [name] reports no history of alcohol use.   Review of Systems CONSTITUTIONAL: No weight loss, fever, chills, weakness or fatigue.  HEENT: Eyes: No visual loss, blurred vision, double vision or yellow sclerae.No hearing loss, sneezing, congestion, runny nose or sore throat.  SKIN: No rash or itching.  CARDIOVASCULAR: per hpi RESPIRATORY: No shortness of breath, cough or sputum.  GASTROINTESTINAL: No anorexia, nausea, vomiting or diarrhea. No  abdominal pain or blood.  GENITOURINARY: No burning on urination, no polyuria NEUROLOGICAL: No headache, dizziness, syncope, paralysis, ataxia, numbness or tingling in the extremities. No change in bowel or bladder control.  MUSCULOSKELETAL: No muscle, back pain, joint pain or stiffness.  LYMPHATICS: No enlarged nodes. No history of splenectomy.  PSYCHIATRIC: No history of depression or anxiety.  ENDOCRINOLOGIC: No reports of sweating, cold or heat intolerance. No polyuria or polydipsia.  Marland Kitchen.   Physical Examination Today's Vitals   10/24/18 1128  BP: (!) 143/84  Pulse: 94  Temp: 98.1 F (36.7 C)  TempSrc: Temporal  SpO2: 96%  Weight: 199 lb (90.3 kg)  Height: 4\' 11"  (1.499 m)   Body mass index is 40.19 kg/m.   Gen: resting comfortably, no acute distress HEENT: no scleral icterus, pupils equal round and reactive, no palptable cervical adenopathy,  CV: irreg, no m/r/g Resp: crackles bilateral bases GI: abdomen is soft, non-tender, non-distended, normal bowel sounds, no hepatosplenomegaly MSK: extremities are warm,1+ bilateral LE edema Skin: warm, no rash Neuro:  no focal deficits Psych: appropriate affect      Assessment and Plan  1. Acute on chronic diastolic HF - volume overloaded by exam. She misreported her home weight,actually 194 lbs and today by our scale she is 199 lbs. Had been as low as 188 lbs within last few months - stop lasix, start torsemide 40mg  bid.  - update us on weights in next few days - obtain BMET/Mg      Antoine PocheJonathan F. Randie Tallarico, M.D

## 2018-10-24 NOTE — Telephone Encounter (Signed)
Patient scheduled for today at 11:30 in office with Dr. Wyline Mood.

## 2018-10-25 ENCOUNTER — Telehealth: Payer: Self-pay

## 2018-10-25 NOTE — Telephone Encounter (Signed)
Error

## 2018-10-30 ENCOUNTER — Other Ambulatory Visit (INDEPENDENT_AMBULATORY_CARE_PROVIDER_SITE_OTHER): Payer: Medicare HMO

## 2018-10-30 DIAGNOSIS — I4819 Other persistent atrial fibrillation: Secondary | ICD-10-CM

## 2018-10-30 NOTE — Progress Notes (Signed)
ekg 

## 2018-11-15 ENCOUNTER — Ambulatory Visit: Payer: Medicare HMO | Admitting: Student

## 2018-11-15 NOTE — Progress Notes (Deleted)
Cardiology Office Note    Date:  11/15/2018   ID:  Tina [name], DOB 1950/09/24, MRN [id number]  PCP:  Alycia Rossetti, MD  Cardiologist: Kate Sable, MD    No chief complaint on file.   History of Present Illness:    Tina [name] is a 68 y.o. female *with past medical history of PAF (diagnosed in 05/2017), chronic diastolic CHF, HTN, HLD, Type 2 DM, OSA, prior vertebral artery dissection (in 02/2017), carotid artery stenosis and prior CVA who presents to the office today for routine follow-up.  She most recently had an add-on office visit in [place] with Dr. Harl Bowie on 10/24/2018 for evaluation of worsening dyspnea.  Her weight had previously been 188 lbs in 08/2018 but had increased to 199 lbs on the office scales. She had been taking Lasix 80 mg twice daily with minimal improvement in her symptoms, therefore this was stopped and switched to Torsemide 40mg  BID. Was suppose to have a repeat BMET in 2 weeks but this has not been obtained.     Past Medical History:  Diagnosis Date  . Atrial fibrillation ([place])    a. diagnosed in 05/2017 --> started on Eliquis for anticoagulation  . Carotid artery stenosis   . CHF (congestive heart failure) ([place])   . Depression   . Diabetes mellitus   . Fibromyalgia   . GERD (gastroesophageal reflux disease)   . Hyperlipidemia   . Hypertension   . Stroke ([place])   . Superficial thrombophlebitis    right leg    Past Surgical History:  Procedure Laterality Date  . CHOLECYSTECTOMY  1991  . HERNIA REPAIR  approx 1969  . NECK SURGERY  1998  . TONSILLECTOMY AND ADENOIDECTOMY  1966    Current Medications: Outpatient Medications Prior to Visit  Medication Sig Dispense Refill  . albuterol (PROVENTIL HFA;VENTOLIN HFA) 108 (90 Base) MCG/ACT inhaler Inhale 2 puffs into the lungs every 6 (six) hours as needed for wheezing or shortness of breath. 1 Inhaler 0  . apixaban (ELIQUIS) 5 MG TABS tablet Take 1 tablet (5 mg total) by mouth 2 (two) times  daily. 180 tablet 3  . aspirin EC 81 MG EC tablet Take 1 tablet (81 mg total) by mouth daily. (Patient taking differently: Take 81 mg by mouth at bedtime. ) 30 tablet 4  . benzonatate (TESSALON) 100 MG capsule Take 1 capsule by mouth three times daily as needed for cough 20 capsule 0  . cetirizine (ZYRTEC) 10 MG tablet Take 10 mg by mouth daily.    . clindamycin (CLEOCIN) 300 MG capsule Take 1 capsule (300 mg total) by mouth 3 (three) times daily. 21 capsule 0  . clotrimazole-betamethasone (LOTRISONE) cream Apply 1 application topically 2 (two) times daily. (Patient taking differently: Apply 1 application topically daily as needed (itching). ) 60 g 1  . fenofibrate 54 MG tablet Take 1 tablet (54 mg total) by mouth daily. 90 tablet 1  . gabapentin (NEURONTIN) 100 MG capsule Take 1 capsule (100 mg total) by mouth at bedtime. 30 capsule 3  . HYDROcodone-acetaminophen (NORCO) 5-325 MG tablet Take 1 tablet by mouth 2 (two) times daily as needed for moderate pain. 30 tablet 0  . Insulin Glargine (BASAGLAR KWIKPEN) 100 UNIT/ML SOPN Inject 1.1 mLs (110 Units total) into the skin every morning. (Patient taking differently: Inject 110 Units into the skin at bedtime. ) 33 mL 1  . ipratropium (ATROVENT) 0.02 % nebulizer solution Take 2.5 mLs (0.5 mg total) by nebulization  every 4 (four) hours as needed for wheezing or shortness of breath. 75 mL 3  . ipratropium (ATROVENT) 0.06 % nasal spray Place 2 sprays into both nostrils 4 (four) times daily as needed for rhinitis. 15 mL 3  . losartan (COZAAR) 25 MG tablet Take 1 tablet (25 mg total) by mouth daily. 30 tablet 2  . Menthol, Topical Analgesic, (BENGAY EX) Apply 1 application topically as needed (to painful sites).    . metoprolol tartrate (LOPRESSOR) 50 MG tablet Take 1 tablet (50 mg total) by mouth 2 (two) times daily. 60 tablet 2  . montelukast (SINGULAIR) 10 MG tablet 10MG  BY MOUTH ONCE DAILY (Patient taking differently: Take 10 mg by mouth at bedtime. ) 90  tablet 3  . nystatin (MYCOSTATIN/NYSTOP) powder Apply topically 4 (four) times daily. (Patient taking differently: Apply 1 g topically 4 (four) times daily as needed (rash). ) 180 g 2  . Omega-3 Fatty Acids (FISH OIL) 1000 MG CAPS Take 2 capsules (2,000 mg total) by mouth 2 (two) times daily. (Patient taking differently: Take 1 capsule by mouth at bedtime. ) 360 capsule 3  . pantoprazole (PROTONIX) 40 MG tablet Take 1 tablet (40 mg total) by mouth daily. 90 tablet 3  . PARoxetine (PAXIL) 40 MG tablet TAKE 1 TABLET BY MOUTH IN THE MORNING 90 tablet 0  . potassium chloride SA (K-DUR,KLOR-CON) 20 MEQ tablet Take 1 tablet (20 mEq total) by mouth daily. 30 tablet 6  . rosuvastatin (CRESTOR) 40 MG tablet Take 1 tablet (40 mg total) by mouth daily. 90 tablet 3  . torsemide (DEMADEX) 20 MG tablet Take 2 tablets (40 mg total) by mouth 2 (two) times daily. 120 tablet 6  . TRUEPLUS INSULIN SYRINGE 31G X 5/16" 1 ML MISC USE AS DIRECTED TO INJECT (Patient taking differently: as directed. ) 100 each 2   No facility-administered medications prior to visit.      Allergies:   Cefuroxime axetil, Fentanyl and related, Niaspan [niacin er], Sulfa antibiotics, Vasotec [enalapril], Welchol [colesevelam hcl], Lyrica [pregabalin], and Penicillins   Social History   Socioeconomic History  . Marital status: Married    Spouse name: Not on file  . Number of children: Not on file  . Years of education: Not on file  . Highest education level: Not on file  Occupational History  . Not on file  Social Needs  . Financial resource strain: Not hard at all  . Food insecurity    Worry: Never true    Inability: Never true  . Transportation needs    Medical: No    Non-medical: No  Tobacco Use  . Smoking status: Former Smoker    Types: Cigarettes  . Smokeless tobacco: Never Used  Substance and Sexual Activity  . Alcohol use: No  . Drug use: No  . Sexual activity: Not Currently  Lifestyle  . Physical activity     Days per week: Not on file    Minutes per session: Not on file  . Stress: Not on file  Relationships  . Social Musicianconnections    Talks on phone: Not on file    Gets together: Not on file    Attends religious service: Not on file    Active member of club or organization: Not on file    Attends meetings of clubs or organizations: Not on file    Relationship status: Not on file  Other Topics Concern  . Not on file  Social History Narrative  . Not on file  Family History:  The patient's ***family history includes Diabetes in her daughter and maternal grandmother; Heart disease in her mother and sister; Hyperlipidemia in her mother and sister; Hypertension in her mother and sister; Stroke in her sister.   Review of Systems:   Please see the history of present illness.     General:  No chills, fever, night sweats or weight changes.  Cardiovascular:  No chest pain, dyspnea on exertion, edema, orthopnea, palpitations, paroxysmal nocturnal dyspnea. Dermatological: No rash, lesions/masses Respiratory: No cough, dyspnea Urologic: No hematuria, dysuria Abdominal:   No nausea, vomiting, diarrhea, bright red blood per rectum, melena, or hematemesis Neurologic:  No visual changes, wkns, changes in mental status. All other systems reviewed and are otherwise negative except as noted above.   Physical Exam:    VS:  There were no vitals taken for this visit.   General: Well developed, well nourished,female appearing in no acute distress. Head: Normocephalic, atraumatic, sclera non-icteric, no xanthomas, nares are without discharge.  Neck: No carotid bruits. JVD not elevated.  Lungs: Respirations regular and unlabored, without wheezes or rales.  Heart: ***Regular rate and rhythm. No S3 or S4.  No murmur, no rubs, or gallops appreciated. Abdomen: Soft, non-tender, non-distended with normoactive bowel sounds. No hepatomegaly. No rebound/guarding. No obvious abdominal masses. Msk:  Strength and  tone appear normal for age. No joint deformities or effusions. Extremities: No clubbing or cyanosis. No edema.  Distal pedal pulses are 2+ bilaterally. Neuro: Alert and oriented X 3. Moves all extremities spontaneously. No focal deficits noted. Psych:  Responds to questions appropriately with a normal affect. Skin: No rashes or lesions noted  Wt Readings from Last 3 Encounters:  10/24/18 199 lb (90.3 kg)  09/05/18 188 lb 9.6 oz (85.5 kg)  08/02/18 198 lb (89.8 kg)        Studies/Labs Reviewed:   EKG:  EKG is*** ordered today.  The ekg ordered today demonstrates ***  Recent Labs: 03/02/2018: ALT 17 07/04/2018: B Natriuretic Peptide 386.9; Hemoglobin 12.8; Platelets 188 07/05/2018: Magnesium 1.9 07/18/2018: BUN 19; Creat 1.01; Potassium 4.2; Sodium 137   Lipid Panel    Component Value Date/Time   CHOL 181 10/16/2017 0411   TRIG 253 (H) 10/16/2017 0411   HDL 43 10/16/2017 0411   CHOLHDL 4.2 10/16/2017 0411   VLDL 51 (H) 10/16/2017 0411   LDLCALC 87 10/16/2017 0411   LDLCALC 121 (H) 08/17/2017 1022    Additional studies/ records that were reviewed today include:   Echocardiogram: 07/05/2018 IMPRESSIONS    1. The left ventricle has normal systolic function, with an ejection fraction of 55-60%. The cavity size was normal. Left ventricular diastology could not be evaluated secondary to atrial fibrillation.  2. The right ventricle has normal systolic function. The cavity was normal. There is no increase in right ventricular wall thickness.  3. Right atrial size was mildly dilated.  4. The mitral valve is normal in structure.  5. The tricuspid valve is normal in structure. Tricuspid valve regurgitation is moderate.  6. The aortic valve is normal in structure.  7. Right atrial pressure is estimated at 3 mmHg.  8. The interatrial septum was not assessed.  Assessment:    No diagnosis found.   Plan:   In order of problems listed above:  1. Chronic Diastolic CHF  2.  Persistent Atrial Fibrillation  3. HTN  4. HLD  5. Carotid Artery Stenosis     Medication Adjustments/Labs and Tests Ordered: Current medicines are reviewed at length with  the patient today.  Concerns regarding medicines are outlined above.  Medication changes, Labs and Tests ordered today are listed in the Patient Instructions below. There are no Patient Instructions on file for this visit.   Signed, Ellsworth LennoxBrittany M Strader, PA-C  11/15/2018 11:43 AM    [facility] [address] [place], [place] [address] Phone: [phone] Fax: [phone]

## 2018-11-17 ENCOUNTER — Telehealth: Payer: Self-pay | Admitting: Family Medicine

## 2018-11-17 ENCOUNTER — Ambulatory Visit: Payer: Medicare HMO | Admitting: Family Medicine

## 2018-11-17 NOTE — Telephone Encounter (Signed)
Pt has ?'s about her medications, she thinks shes not taking the correct ones.

## 2018-11-20 ENCOUNTER — Other Ambulatory Visit: Payer: Self-pay | Admitting: Family Medicine

## 2018-11-21 ENCOUNTER — Other Ambulatory Visit: Payer: Self-pay | Admitting: Family Medicine

## 2018-11-21 NOTE — Telephone Encounter (Signed)
Call placed to patient. LMTRC.  

## 2018-11-22 NOTE — Telephone Encounter (Signed)
Received VM from patient daughter, [name].   Requested refill on Hydrocodone/APAP. Ok to refill?? Last office visit 07/18/2018. Last refill 10/03/2018.  Also requested referral to Dr. Chalmers Cater Endo. Ok to place orders?

## 2018-11-22 NOTE — Telephone Encounter (Signed)
Okay to refill pain meds Why is she trying to switch endocrinologist, she already sees Dr. Loanne Drilling?

## 2018-11-22 NOTE — Telephone Encounter (Signed)
Call placed to patient. No answer. No VM.   Call placed to patient daughter, Tina [name]. [place].

## 2018-11-23 NOTE — Telephone Encounter (Signed)
Call placed to patient. No answer. VM full. 

## 2018-11-27 MED ORDER — HYDROCODONE-ACETAMINOPHEN 5-325 MG PO TABS: 1.0000 | ORAL_TABLET | Freq: Two times a day (BID) | ORAL | 0 refills | Status: DC | PRN

## 2018-11-27 NOTE — Telephone Encounter (Signed)
Multiple calls placed to patient with no answer and no return call.   Message to be closed.  

## 2018-11-27 NOTE — Telephone Encounter (Signed)
Dr. Buelah Manis approved refill on medication,but did not send refill.   Please sign to send to pharmacy.

## 2018-11-29 ENCOUNTER — Other Ambulatory Visit: Payer: Self-pay

## 2018-11-29 ENCOUNTER — Ambulatory Visit (INDEPENDENT_AMBULATORY_CARE_PROVIDER_SITE_OTHER): Payer: Medicare Other | Admitting: Student

## 2018-11-29 ENCOUNTER — Encounter: Payer: Self-pay | Admitting: Student

## 2018-11-29 VITALS — BP 146/76 | HR 85 | Temp 98.5°F | Ht 59.0 in | Wt 192.0 lb

## 2018-11-29 DIAGNOSIS — I10 Essential (primary) hypertension: Secondary | ICD-10-CM | POA: Diagnosis not present

## 2018-11-29 DIAGNOSIS — I5032 Chronic diastolic (congestive) heart failure: Secondary | ICD-10-CM

## 2018-11-29 DIAGNOSIS — I6523 Occlusion and stenosis of bilateral carotid arteries: Secondary | ICD-10-CM | POA: Diagnosis not present

## 2018-11-29 DIAGNOSIS — I4819 Other persistent atrial fibrillation: Secondary | ICD-10-CM

## 2018-11-29 DIAGNOSIS — E785 Hyperlipidemia, unspecified: Secondary | ICD-10-CM | POA: Diagnosis not present

## 2018-11-29 NOTE — Progress Notes (Signed)
Cardiology Office Note    Date:  11/29/2018   ID:  Tina [name], DOB 11-20-1950, MRN [id number]  PCP:  Alycia Rossetti, MD  Cardiologist: Kate Sable, MD    Chief Complaint  Patient presents with  . Follow-up    Fatigue; Dyspnea    History of Present Illness:    Tina [name] is a 68 y.o. female with past medical history of PAF (diagnosed in 05/2017), chronic diastolic CHF, HTN, HLD, Type 2 DM, OSA, prior vertebral artery dissection (in 02/2017), carotid artery stenosis and prior CVA who presents to the office today for routine follow-up.  She most recently had an add-on office visit in [place] with Dr. Harl Bowie on 10/24/2018 for evaluation of worsening dyspnea.  Her weight had previously been 188 lbs in 08/2018 but had increased to 199 lbs on the office scales. She had been taking Lasix 80 mg twice daily with minimal improvement in her symptoms, therefore this was stopped and switched to Torsemide 40mg  BID. Was suppose to have a repeat BMET in 2 weeks but this has not been obtained.    In talking with the patient today, she reports her edema improved after being switched from Lasix to Torsemide. Weight has declined by 7 lbs on the office scales and she reports her weight was in the 180's on her home scales the last time she weighed (has not been weighing regularly). She still has dyspnea on exertion but denies any acute change in her symptoms. Still experiencing fatigue and intermittent palpitations. Wishes to reschedule her DCCV which was initially postponed due to [place]. In reviewing the importance of compliance with Eliquis, she tells me she ran out of the medication last week and went two days without it. Has since restarted and not missed any doses. She is listed as being on Lopressor 12.5mg  BID and 50mg  BID but is unsure which one she is taking.   Denies any recent orthopnea, PND, or chest pain. Does experience intermittent lower extremity edema.    Past Medical History:   Diagnosis Date  . Atrial fibrillation ([place])    a. diagnosed in 05/2017 --> started on Eliquis for anticoagulation  . Carotid artery stenosis   . CHF (congestive heart failure) ([place])   . Depression   . Diabetes mellitus   . Fibromyalgia   . GERD (gastroesophageal reflux disease)   . Hyperlipidemia   . Hypertension   . Stroke ([place])   . Superficial thrombophlebitis    right leg    Past Surgical History:  Procedure Laterality Date  . CHOLECYSTECTOMY  1991  . HERNIA REPAIR  approx 1969  . NECK SURGERY  1998  . TONSILLECTOMY AND ADENOIDECTOMY  1966    Current Medications: Outpatient Medications Prior to Visit  Medication Sig Dispense Refill  . albuterol (PROVENTIL HFA;VENTOLIN HFA) 108 (90 Base) MCG/ACT inhaler Inhale 2 puffs into the lungs every 6 (six) hours as needed for wheezing or shortness of breath. 1 Inhaler 0  . aspirin EC 81 MG EC tablet Take 1 tablet (81 mg total) by mouth daily. (Patient taking differently: Take 81 mg by mouth at bedtime. ) 30 tablet 4  . benzonatate (TESSALON) 100 MG capsule Take 1 capsule by mouth three times daily as needed for cough 20 capsule 0  . cetirizine (ZYRTEC) 10 MG tablet Take 10 mg by mouth daily.    . clotrimazole-betamethasone (LOTRISONE) cream Apply 1 application topically 2 (two) times daily. (Patient taking differently: Apply 1 application topically daily as  needed (itching). ) 60 g 1  . ELIQUIS 5 MG TABS tablet Take 1 tablet by mouth twice daily 60 tablet 0  . fenofibrate 54 MG tablet Take 1 tablet by mouth once daily 90 tablet 0  . gabapentin (NEURONTIN) 100 MG capsule Take 1 capsule by mouth at bedtime 30 capsule 0  . HYDROcodone-acetaminophen (NORCO) 5-325 MG tablet Take 1 tablet by mouth 2 (two) times daily as needed for moderate pain. 30 tablet 0  . Insulin Glargine (BASAGLAR KWIKPEN) 100 UNIT/ML SOPN Inject 1.1 mLs (110 Units total) into the skin every morning. (Patient taking differently: Inject 110 Units into the skin at bedtime.  ) 33 mL 1  . ipratropium (ATROVENT) 0.02 % nebulizer solution Take 2.5 mLs (0.5 mg total) by nebulization every 4 (four) hours as needed for wheezing or shortness of breath. 75 mL 3  . ipratropium (ATROVENT) 0.06 % nasal spray Place 2 sprays into both nostrils 4 (four) times daily as needed for rhinitis. 15 mL 3  . losartan (COZAAR) 25 MG tablet Take 1 tablet (25 mg total) by mouth daily. 30 tablet 2  . Menthol, Topical Analgesic, (BENGAY EX) Apply 1 application topically as needed (to painful sites).    . metoprolol tartrate (LOPRESSOR) 25 MG tablet Take 1/2 (one-half) tablet by mouth twice daily 90 tablet 0  . metoprolol tartrate (LOPRESSOR) 50 MG tablet Take 1 tablet (50 mg total) by mouth 2 (two) times daily. 60 tablet 2  . montelukast (SINGULAIR) 10 MG tablet 10MG  BY MOUTH ONCE DAILY (Patient taking differently: Take 10 mg by mouth at bedtime. ) 90 tablet 3  . nystatin (MYCOSTATIN/NYSTOP) powder Apply topically 4 (four) times daily. (Patient taking differently: Apply 1 g topically 4 (four) times daily as needed (rash). ) 180 g 2  . Omega-3 Fatty Acids (FISH OIL) 1000 MG CAPS Take 2 capsules (2,000 mg total) by mouth 2 (two) times daily. (Patient taking differently: Take 1 capsule by mouth at bedtime. ) 360 capsule 3  . pantoprazole (PROTONIX) 40 MG tablet Take 1 tablet by mouth once daily 90 tablet 1  . PARoxetine (PAXIL) 40 MG tablet TAKE 1 TABLET BY MOUTH IN THE MORNING 90 tablet 0  . potassium chloride SA (K-DUR,KLOR-CON) 20 MEQ tablet Take 1 tablet (20 mEq total) by mouth daily. 30 tablet 6  . rosuvastatin (CRESTOR) 40 MG tablet Take 1 tablet (40 mg total) by mouth daily. 90 tablet 3  . torsemide (DEMADEX) 20 MG tablet Take 2 tablets (40 mg total) by mouth 2 (two) times daily. 120 tablet 6  . TRUEPLUS INSULIN SYRINGE 31G X 5/16" 1 ML MISC USE AS DIRECTED TO INJECT (Patient taking differently: as directed. ) 100 each 2  . clindamycin (CLEOCIN) 300 MG capsule Take 1 capsule (300 mg total) by  mouth 3 (three) times daily. 21 capsule 0   No facility-administered medications prior to visit.      Allergies:   Cefuroxime axetil, Fentanyl and related, Niaspan [niacin er], Sulfa antibiotics, Vasotec [enalapril], Welchol [colesevelam hcl], Lyrica [pregabalin], and Penicillins   Social History   Socioeconomic History  . Marital status: Married    Spouse name: Not on file  . Number of children: Not on file  . Years of education: Not on file  . Highest education level: Not on file  Occupational History  . Not on file  Social Needs  . Financial resource strain: Not hard at all  . Food insecurity    Worry: Never true  Inability: Never true  . Transportation needs    Medical: No    Non-medical: No  Tobacco Use  . Smoking status: Former Smoker    Types: Cigarettes  . Smokeless tobacco: Never Used  Substance and Sexual Activity  . Alcohol use: No  . Drug use: No  . Sexual activity: Not Currently  Lifestyle  . Physical activity    Days per week: Not on file    Minutes per session: Not on file  . Stress: Not on file  Relationships  . Social Musician on phone: Not on file    Gets together: Not on file    Attends religious service: Not on file    Active member of club or organization: Not on file    Attends meetings of clubs or organizations: Not on file    Relationship status: Not on file  Other Topics Concern  . Not on file  Social History Narrative  . Not on file     Family History:  The patient's family history includes Diabetes in her daughter and maternal grandmother; Heart disease in her mother and sister; Hyperlipidemia in her mother and sister; Hypertension in her mother and sister; Stroke in her sister.   Review of Systems:   Please see the history of present illness.     General:  No chills, fever, night sweats or weight changes. Positive for fatigue.  Cardiovascular:  No chest pain,  edema, orthopnea, paroxysmal nocturnal dyspnea. Positive  for palpitations and dyspnea on exertion.  Dermatological: No rash, lesions/masses Respiratory: No cough, dyspnea Urologic: No hematuria, dysuria Abdominal:   No nausea, vomiting, diarrhea, bright red blood per rectum, melena, or hematemesis Neurologic:  No visual changes, wkns, changes in mental status. All other systems reviewed and are otherwise negative except as noted above.   Physical Exam:    VS:  BP (!) 146/76 (BP Location: Right Arm)   Pulse 85   Temp 98.5 F (36.9 C)   Ht  (1.499 m)   Wt 192 lb (87.1 kg)   SpO2 94%   BMI 38.78 kg/m    General: Well developed, well nourished,female appearing in no acute distress. Head: Normocephalic, atraumatic, sclera non-icteric, no xanthomas, nares are without discharge.  Neck: No carotid bruits. JVD not elevated.  Lungs: Respirations regular and unlabored, without wheezes or rales.  Heart: Irregularly irregular. No S3 or S4.  No murmur, no rubs, or gallops appreciated. Abdomen: Soft, non-tender, non-distended with normoactive bowel sounds. No hepatomegaly. No rebound/guarding. No obvious abdominal masses. Msk:  Strength and tone appear normal for age. No joint deformities or effusions. Extremities: No clubbing or cyanosis. Trace ankle edema bilaterally.  Distal pedal pulses are 2+ bilaterally. Neuro: Alert and oriented X 3. Moves all extremities spontaneously. No focal deficits noted. Psych:  Responds to questions appropriately with a normal affect. Skin: No rashes or lesions noted  Wt Readings from Last 3 Encounters:  11/29/18 192 lb (87.1 kg)  10/24/18 199 lb (90.3 kg)  09/05/18 188 lb 9.6 oz (85.5 kg)     Studies/Labs Reviewed:   EKG:  EKG is not ordered today.   Recent Labs: 03/02/2018: ALT 17 07/04/2018: B Natriuretic Peptide 386.9; Hemoglobin 12.8; Platelets 188 07/05/2018: Magnesium 1.9 07/18/2018: BUN 19; Creat 1.01; Potassium 4.2; Sodium 137   Lipid Panel    Component Value Date/Time   CHOL 181 10/16/2017  0411   TRIG 253 (H) 10/16/2017 0411   HDL 43 10/16/2017 0411   CHOLHDL  4.2 10/16/2017 0411   VLDL 51 (H) 10/16/2017 0411   LDLCALC 87 10/16/2017 0411   LDLCALC 121 (H) 08/17/2017 1022    Additional studies/ records that were reviewed today include:   Echocardiogram: 07/05/2018 IMPRESSIONS    1. The left ventricle has normal systolic function, with an ejection fraction of 55-60%. The cavity size was normal. Left ventricular diastology could not be evaluated secondary to atrial fibrillation.  2. The right ventricle has normal systolic function. The cavity was normal. There is no increase in right ventricular wall thickness.  3. Right atrial size was mildly dilated.  4. The mitral valve is normal in structure.  5. The tricuspid valve is normal in structure. Tricuspid valve regurgitation is moderate.  6. The aortic valve is normal in structure.  7. Right atrial pressure is estimated at 3 mmHg.  8. The interatrial septum was not assessed.  Assessment:    1. Chronic diastolic heart failure (HCC)   2. Persistent atrial fibrillation   3. Essential hypertension   4. Hyperlipidemia LDL goal <70   5. Bilateral carotid artery stenosis      Plan:   In order of problems listed above:  1. Chronic Diastolic CHF - her symptoms did improve with being switched from Lasix to Torsemide 40mg  BID in 10/2018. Has baseline dyspnea on exertion and intermittent edema but denies any orthopnea or PND.  She has trace edema on examination but lungs are clear. - Will continue with Torsemide at current dosing. She never obtained repeat lab work after being switched to Torsemide and I strongly encouraged her to have this performed today as we need to reassess her kidney function and potassium levels.  2. Persistent Atrial Fibrillation - She has still been experiencing worsening fatigue and intermittent palpitations as described above. Was originally scheduled for DCCV earlier this year but postponed given  COVID-19.  By review of Dr. Junius ArgyleKoneswaran's prior notes, the plan was to arrange for repeat DCCV if still symptomatic. Given that she did miss Eliquis last week, will have to reschedule this for 3 weeks from then. Compliance with Eliquis was strongly encouraged and I informed her to make us aware if she misses any doses. - Continue Lopressor for rate control.  3. HTN - BP is at 146/76 during today's visit. She takes Losartan 25 mg daily but is unsure if she has been taking Lopressor 50 mg twice daily or Lopressor 12.5 mg twice daily. I have asked her to call and verify this dose upon returning home.  4. HLD - Followed by PCP. Goal LDL is less than 70 in the setting of prior CVA. She remains on Crestor 40 mg daily.  5. Carotid Artery Stenosis - Doppler studies in 2019 showed 40 to 59% RICA stenosis and 1 to 39% LICA stenosis.  Remains on ASA per neurology, Eliquis, and statin therapy.  Medication Adjustments/Labs and Tests Ordered: Current medicines are reviewed at length with the patient today.  Concerns regarding medicines are outlined above.  Medication changes, Labs and Tests ordered today are listed in the Patient Instructions below. Patient Instructions  Medication Instructions: Your physician recommends that you continue on your current medications as directed. Please refer to the Current Medication list given to you today.   Labwork: Cbc, bmet today  Procedures/Testing: Your physician has recommended that you have a Cardioversion (DCCV). Electrical Cardioversion uses a jolt of electricity to your heart either through paddles or wired patches attached to your chest. This is a controlled, usually prescheduled, procedure.  Defibrillation is done under light anesthesia in the hospital, and you usually go home the day of the procedure. This is done to get your heart back into a normal rhythm. You are not awake for the procedure. Please see the instruction sheet given to you today.    Follow-Up: 2-3 weeks after cardioversion  Any Additional Special Instructions Will Be Listed Below (If Applicable).   If you need a refill on your cardiac medications before your next appointment, please call your pharmacy.    Thank you for choosing [facility] !        Signed, Ellsworth LennoxBrittany M Roper Tolson, PA-C  11/29/2018 5:34 PM    [facility] [address] [place], [place] [address] Phone: [phone] Fax: [phone]

## 2018-11-29 NOTE — Patient Instructions (Signed)
Medication Instructions: Your physician recommends that you continue on your current medications as directed. Please refer to the Current Medication list given to you today.   Labwork: Cbc, bmet today  Procedures/Testing: Your physician has recommended that you have a Cardioversion (DCCV). Electrical Cardioversion uses a jolt of electricity to your heart either through paddles or wired patches attached to your chest. This is a controlled, usually prescheduled, procedure. Defibrillation is done under light anesthesia in the hospital, and you usually go home the day of the procedure. This is done to get your heart back into a normal rhythm. You are not awake for the procedure. Please see the instruction sheet given to you today.     Follow-Up:  1 month after cardioversion  Any Additional Special Instructions Will Be Listed Below (If Applicable).     If you need a refill on your cardiac medications before your next appointment, please call your pharmacy.       Thank you for choosing [facility] !

## 2018-11-30 ENCOUNTER — Telehealth: Payer: Self-pay

## 2018-11-30 DIAGNOSIS — I10 Essential (primary) hypertension: Secondary | ICD-10-CM

## 2018-11-30 LAB — BASIC METABOLIC PANEL
BUN: 17 mg/dL (ref 7–25)
CO2: 29 mmol/L (ref 20–32)
Calcium: 9.4 mg/dL (ref 8.6–10.4)
Chloride: 99 mmol/L (ref 98–110)
Creat: 0.84 mg/dL (ref 0.50–0.99)
Glucose, Bld: 100 mg/dL (ref 65–139)
Potassium: 3.4 mmol/L — ABNORMAL LOW (ref 3.5–5.3)
Sodium: 139 mmol/L (ref 135–146)

## 2018-11-30 LAB — CBC
HCT: 42.7 % (ref 35.0–45.0)
Hemoglobin: 14.8 g/dL (ref 11.7–15.5)
MCH: 32.7 pg (ref 27.0–33.0)
MCHC: 34.7 g/dL (ref 32.0–36.0)
MCV: 94.3 fL (ref 80.0–100.0)
MPV: 11.9 fL (ref 7.5–12.5)
Platelets: 222 10*3/uL (ref 140–400)
RBC: 4.53 10*6/uL (ref 3.80–5.10)
RDW: 14.2 % (ref 11.0–15.0)
WBC: 9.4 10*3/uL (ref 3.8–10.8)

## 2018-11-30 MED ORDER — METOPROLOL TARTRATE 25 MG PO TABS: 25.0000 mg | ORAL_TABLET | Freq: Two times a day (BID) | ORAL | 0 refills | Status: DC

## 2018-11-30 NOTE — Telephone Encounter (Signed)
Called pt. No answer, no voicemail

## 2018-11-30 NOTE — Telephone Encounter (Signed)
Called pt. Informed her of lab results. She voiced understanding. She wanted me to inform you that she is taking her metoprolol only 12.5 mg daily. She will have labs done in 2 weeks.

## 2018-11-30 NOTE — Telephone Encounter (Signed)
-----   Message from Erma Heritage, [place] sent at 11/30/2018  7:16 AM EDT ----- Please let the patient know her Hgb and platelet counts are within a normal range. Kidney function remains stable with transitioning to Torsemide. K+ was low at 3.4 and she reported only taking K-dur occasionally which could be contributing to her leg cramps. Would recommend taking K-Dur 20 mEq DAILY as prescribed. Repeat BMET in 2 weeks.

## 2018-11-30 NOTE — Telephone Encounter (Signed)
    She was previously on 50mg  BID and by review of notes it does not appear this was decreased by Cardiology. Given her worsening palpitations, please have her increase this to 25mg  BID. Follow HR and BP at home with medication changes.   Signed, Erma Heritage, PA-C 11/30/2018, 12:06 PM Pager: [phone]

## 2018-12-01 NOTE — Telephone Encounter (Signed)
Called pt.  No answer.  Mailbox full

## 2018-12-11 ENCOUNTER — Other Ambulatory Visit: Payer: Self-pay

## 2018-12-11 ENCOUNTER — Encounter: Payer: Self-pay | Admitting: Family Medicine

## 2018-12-11 ENCOUNTER — Ambulatory Visit (INDEPENDENT_AMBULATORY_CARE_PROVIDER_SITE_OTHER): Payer: Medicare Other | Admitting: Family Medicine

## 2018-12-11 VITALS — BP 140/78 | HR 114 | Temp 98.0°F | Resp 16 | Ht 59.0 in | Wt 186.0 lb

## 2018-12-11 DIAGNOSIS — E11649 Type 2 diabetes mellitus with hypoglycemia without coma: Secondary | ICD-10-CM | POA: Diagnosis not present

## 2018-12-11 DIAGNOSIS — I4819 Other persistent atrial fibrillation: Secondary | ICD-10-CM | POA: Diagnosis not present

## 2018-12-11 DIAGNOSIS — I10 Essential (primary) hypertension: Secondary | ICD-10-CM | POA: Diagnosis not present

## 2018-12-11 DIAGNOSIS — I6930 Unspecified sequelae of cerebral infarction: Secondary | ICD-10-CM

## 2018-12-11 DIAGNOSIS — K047 Periapical abscess without sinus: Secondary | ICD-10-CM

## 2018-12-11 DIAGNOSIS — Z6838 Body mass index (BMI) 38.0-38.9, adult: Secondary | ICD-10-CM

## 2018-12-11 DIAGNOSIS — I5032 Chronic diastolic (congestive) heart failure: Secondary | ICD-10-CM | POA: Diagnosis not present

## 2018-12-11 DIAGNOSIS — E7800 Pure hypercholesterolemia, unspecified: Secondary | ICD-10-CM

## 2018-12-11 DIAGNOSIS — E66812 Obesity, class 2: Secondary | ICD-10-CM

## 2018-12-11 MED ORDER — PAROXETINE HCL 40 MG PO TABS: ORAL_TABLET | ORAL | 0 refills | Status: DC

## 2018-12-11 MED ORDER — PANTOPRAZOLE SODIUM 40 MG PO TBEC: 40.0000 mg | DELAYED_RELEASE_TABLET | Freq: Every day | ORAL | 1 refills | Status: AC

## 2018-12-11 MED ORDER — POTASSIUM CHLORIDE CRYS ER 20 MEQ PO TBCR: 20.0000 meq | EXTENDED_RELEASE_TABLET | Freq: Every day | ORAL | 1 refills | Status: DC

## 2018-12-11 MED ORDER — CLINDAMYCIN HCL 300 MG PO CAPS: 300.0000 mg | ORAL_CAPSULE | Freq: Three times a day (TID) | ORAL | 0 refills | Status: DC

## 2018-12-11 MED ORDER — BASAGLAR KWIKPEN 100 UNIT/ML ~~LOC~~ SOPN: 120.0000 [IU] | PEN_INJECTOR | SUBCUTANEOUS | 1 refills | Status: DC

## 2018-12-11 MED ORDER — MONTELUKAST SODIUM 10 MG PO TABS: 10.0000 mg | ORAL_TABLET | Freq: Every day | ORAL | 1 refills | Status: DC

## 2018-12-11 MED ORDER — ROSUVASTATIN CALCIUM 40 MG PO TABS: 40.0000 mg | ORAL_TABLET | Freq: Every day | ORAL | 3 refills | Status: DC

## 2018-12-11 MED ORDER — LOSARTAN POTASSIUM 25 MG PO TABS: 25.0000 mg | ORAL_TABLET | Freq: Every day | ORAL | 2 refills | Status: DC

## 2018-12-11 MED ORDER — METOPROLOL TARTRATE 25 MG PO TABS: 25.0000 mg | ORAL_TABLET | Freq: Two times a day (BID) | ORAL | 0 refills | Status: DC

## 2018-12-11 NOTE — Assessment & Plan Note (Addendum)
Diabetes has been uncontrolled for many years.  She is on a large dose of long-acting insulin.  Will refer her to new endocrinologist that will be posterior to her daughter and they also go to this practice.  Hopefully this will help with compliance. Continue with the 120 units of insulin

## 2018-12-11 NOTE — Progress Notes (Signed)
Subjective:    Patient ID: Tina [name], female    DOB: 10-12-1950, 68 y.o.   MRN: [id number]  Patient presents for Follow-up (is not fasting) and Referral (endo- does not want to see Dr.Ellison- wants to see Dr Chalmers Cater)  Pt here to f/u chronic medical problems. She is scheduled for cardioversion next Wed for her A fib. Still gets very SOB, also gets a lot of fluid build up. Now on demadex 40mg  twice a day.  She is also on eliquis, no abnormal  She would like to switch to Dr. Chalmers Cater for endocrinology. Her diabetes is chronically uncontrolled for years, last A1C  taking basaglar 120units This AM CBG fasting 162  Her daughters go to Dr. Chalmers Cater, she is moving closer to her children   HTN- has not checked her BP, she is taking her meds   Memory is not good, since they  Right ear pain, feels like she has 2 abscess teeth , she has not scheduled with dentist    Needs prescriptions sent to [facility]      Review Of Systems:  GEN- denies fatigue, fever, weight loss,weakness, recent illness HEENT- denies eye drainage, change in vision, nasal discharge, CVS- denies chest pain, palpitations RESP- denies SOB, cough, wheeze ABD- denies N/V, change in stools, abd pain GU- denies dysuria, hematuria, dribbling, incontinence MSK- denies joint pain, muscle aches, injury Neuro- denies headache, dizziness, syncope, seizure activity       Objective:    BP 140/78   Pulse (!) 114   Temp 98 F (36.7 C) (Oral)   Resp 16   Ht 4\' 11"  (1.499 m)   Wt 186 lb (84.4 kg)   SpO2 92%   BMI 37.57 kg/m  GEN- NAD, alert and oriented x3 HEENT- PERRL, EOMI, non injected sclera, pink conjunctiva, MMM, oropharynx clear, TM clear no effusion TTP lower gumline, erthema, mild bogginess of gum  Neck- Supple, no thyromegaly, no LAD  CVS- irregular rhythem, rate 90-100 no murmur RESP-CTAB ABD-NABS,soft,NT,ND EXT- trace pedal  edema Pulses- Radial, DP- 2+        Assessment & Plan:      Problem List  Items Addressed This Visit      Unprioritized   Atrial fibrillation (HCC)   Relevant Medications   metoprolol tartrate (LOPRESSOR) 25 MG tablet   rosuvastatin (CRESTOR) 40 MG tablet   losartan (COZAAR) 25 MG tablet   CHF (congestive heart failure) (HCC)   Relevant Medications   metoprolol tartrate (LOPRESSOR) 25 MG tablet   rosuvastatin (CRESTOR) 40 MG tablet   losartan (COZAAR) 25 MG tablet   Diabetes mellitus type II, uncontrolled (HCC)    Diabetes has been uncontrolled for many years.  She is on a large dose of long-acting insulin.  Will refer her to new endocrinologist that will be posterior to her daughter and they also go to this practice.  Hopefully this will help with compliance. Continue with the 120 units of insulin      Relevant Medications   rosuvastatin (CRESTOR) 40 MG tablet   losartan (COZAAR) 25 MG tablet   Insulin Glargine (BASAGLAR KWIKPEN) 100 UNIT/ML SOPN   Other Relevant Orders   Hemoglobin A1c   Lipid panel   Ambulatory referral to Endocrinology   Essential hypertension    Mild elevation in her blood pressure.  Her heart rate is staying below 100 for the most part.  When she exerts herself it does go up some.  They are planning for cardioversion next week.  Guarding her CHF she is compensated continue the torsemide.  I will recheck that metabolic panel for the renal function today.  She is going for cardioversion next week and has some dental inflammation get a go ahead and put her on clindamycin for a week.  She can follow-up with her dentist after the procedure for her heart.      Relevant Medications   metoprolol tartrate (LOPRESSOR) 25 MG tablet   rosuvastatin (CRESTOR) 40 MG tablet   losartan (COZAAR) 25 MG tablet   Other Relevant Orders   Comprehensive metabolic panel   Lipid panel   History of cerebrovascular accident (CVA) with residual deficit    She does have residual memory deficit      Hyperlipidemia   Relevant Medications   metoprolol  tartrate (LOPRESSOR) 25 MG tablet   rosuvastatin (CRESTOR) 40 MG tablet   losartan (COZAAR) 25 MG tablet   Obesity   Relevant Medications   Insulin Glargine (BASAGLAR KWIKPEN) 100 UNIT/ML SOPN    Other Visit Diagnoses    Dental infection    -  Primary   Relevant Medications   clindamycin (CLEOCIN) 300 MG capsule      Note: This dictation was prepared with Dragon dictation along with smaller phrase technology. Any transcriptional errors that result from this process are unintentional.

## 2018-12-11 NOTE — Assessment & Plan Note (Signed)
Mild elevation in her blood pressure.  Her heart rate is staying below 100 for the most part.  When she exerts herself it does go up some.  They are planning for cardioversion next week.  Guarding her CHF she is compensated continue the torsemide.  I will recheck that metabolic panel for the renal function today.  She is going for cardioversion next week and has some dental inflammation get a go ahead and put her on clindamycin for a week.  She can follow-up with her dentist after the procedure for her heart.

## 2018-12-11 NOTE — Patient Instructions (Addendum)
We will send referral to Dr. Chalmers Cater Continue the Basaglar at 120units once a day  For teeth take antibiotics, use mouthrinse Schedule a visit with the dentist Check your blood pressure  F/U 4 months for physical

## 2018-12-11 NOTE — Assessment & Plan Note (Signed)
She does have residual memory deficit

## 2018-12-12 LAB — COMPREHENSIVE METABOLIC PANEL
AG Ratio: 1.2 (calc) (ref 1.0–2.5)
ALT: 18 U/L (ref 6–29)
AST: 21 U/L (ref 10–35)
Albumin: 3.8 g/dL (ref 3.6–5.1)
Alkaline phosphatase (APISO): 81 U/L (ref 37–153)
BUN: 20 mg/dL (ref 7–25)
CO2: 32 mmol/L (ref 20–32)
Calcium: 8.9 mg/dL (ref 8.6–10.4)
Chloride: 99 mmol/L (ref 98–110)
Creat: 0.85 mg/dL (ref 0.50–0.99)
Globulin: 3.1 g/dL (calc) (ref 1.9–3.7)
Glucose, Bld: 318 mg/dL — ABNORMAL HIGH (ref 65–99)
Potassium: 4.3 mmol/L (ref 3.5–5.3)
Sodium: 139 mmol/L (ref 135–146)
Total Bilirubin: 0.6 mg/dL (ref 0.2–1.2)
Total Protein: 6.9 g/dL (ref 6.1–8.1)

## 2018-12-12 LAB — LIPID PANEL
Cholesterol: 167 mg/dL (ref <200)
HDL: 40 mg/dL — ABNORMAL LOW (ref >=50)
LDL Cholesterol (Calc): 91 mg/dL (calc)
Non-HDL Cholesterol (Calc): 127 mg/dL (calc) (ref <130)
Total CHOL/HDL Ratio: 4.2 (calc) (ref <5.0)
Triglycerides: 273 mg/dL — ABNORMAL HIGH (ref <150)

## 2018-12-12 LAB — HEMOGLOBIN A1C
Hgb A1c MFr Bld: 10.9 % of total Hgb — ABNORMAL HIGH (ref <5.7)
Mean Plasma Glucose: 266 (calc)
eAG (mmol/L): 14.7 (calc)

## 2018-12-14 ENCOUNTER — Encounter: Payer: Self-pay | Admitting: *Deleted

## 2018-12-15 NOTE — Patient Instructions (Signed)
Tina [name]  12/15/2018     @PREFPERIOPPHARMACY @   Your procedure is scheduled on  12/20/2018.  Report to [facility] at  1000  A.M.  Call this number if you have problems the morning of surgery:  [phone]   Remember:  Do not eat or drink after midnight.                        Take these medicines the morning of surgery with A SIP OF WATER  Gabapentin, hydrocodone(if needed), protonix, paxil.    Do not wear jewelry, make-up or nail polish.  Do not wear lotions, powders, or perfumes, or deodorant.  Do not shave 48 hours prior to surgery.  Men may shave face and neck.  Do not bring valuables to the hospital.  [facility] is not responsible for any belongings or valuables.  Contacts, dentures or bridgework may not be worn into surgery.  Leave your suitcase in the car.  After surgery it may be brought to your room.  For patients admitted to the hospital, discharge time will be determined by your treatment team.  Patients discharged the day of surgery will not be allowed to drive home.   Name and phone number of your driver:   family Special instructions:  DO NOT miss any doses of eliquis before your procedure.  Please read over the following fact sheets that you were given. Anesthesia Post-op Instructions and Care and Recovery After Surgery       Electrical Cardioversion, Care After This sheet gives you information about how to care for yourself after your procedure. Your health care provider may also give you more specific instructions. If you have problems or questions, contact your health care provider. What can I expect after the procedure? After the procedure, it is common to have:  Some redness on the skin where the shocks were given. Follow these instructions at home:   Do not drive for 24 hours if you were given a medicine to help you relax (sedative).  Take over-the-counter and prescription medicines only as told by your health care  provider.  Ask your health care provider how to check your pulse. Check it often.  Rest for 48 hours after the procedure or as told by your health care provider.  Avoid or limit your caffeine use as told by your health care provider. Contact a health care provider if:  You feel like your heart is beating too quickly or your pulse is not regular.  You have a serious muscle cramp that does not go away. Get help right away if:   You have discomfort in your chest.  You are dizzy or you feel faint.  You have trouble breathing or you are short of breath.  Your speech is slurred.  You have trouble moving an arm or leg on one side of your body.  Your fingers or toes turn cold or blue. This information is not intended to replace advice given to you by your health care provider. Make sure you discuss any questions you have with your health care provider. Document Released: 02/28/2013 Document Revised: 04/22/2017 Document Reviewed: 11/14/2015 Elsevier Patient Education  2020 [place] After These instructions provide you with information about caring for yourself after your procedure. Your health care provider may also give you more specific instructions. Your treatment has been planned according to current medical practices, but problems sometimes occur. Call  your health care provider if you have any problems or questions after your procedure. What can I expect after the procedure? After your procedure, you may:  Feel sleepy for several hours.  Feel clumsy and have poor balance for several hours.  Feel forgetful about what happened after the procedure.  Have poor judgment for several hours.  Feel nauseous or vomit.  Have a sore throat if you had a breathing tube during the procedure. Follow these instructions at home: For at least 24 hours after the procedure:      Have a responsible adult stay with you. It is important to have someone  help care for you until you are awake and alert.  Rest as needed.  Do not: ? Participate in activities in which you could fall or become injured. ? Drive. ? Use heavy machinery. ? Drink alcohol. ? Take sleeping pills or medicines that cause drowsiness. ? Make important decisions or sign legal documents. ? Take care of children on your own. Eating and drinking  Follow the diet that is recommended by your health care provider.  If you vomit, drink water, juice, or soup when you can drink without vomiting.  Make sure you have little or no nausea before eating solid foods. General instructions  Take over-the-counter and prescription medicines only as told by your health care provider.  If you have sleep apnea, surgery and certain medicines can increase your risk for breathing problems. Follow instructions from your health care provider about wearing your sleep device: ? Anytime you are sleeping, including during daytime naps. ? While taking prescription pain medicines, sleeping medicines, or medicines that make you drowsy.  If you smoke, do not smoke without supervision.  Keep all follow-up visits as told by your health care provider. This is important. Contact a health care provider if:  You keep feeling nauseous or you keep vomiting.  You feel light-headed.  You develop a rash.  You have a fever. Get help right away if:  You have trouble breathing. Summary  For several hours after your procedure, you may feel sleepy and have poor judgment.  Have a responsible adult stay with you for at least 24 hours or until you are awake and alert. This information is not intended to replace advice given to you by your health care provider. Make sure you discuss any questions you have with your health care provider. Document Released: 08/31/2015 Document Revised: 08/08/2017 Document Reviewed: 08/31/2015 Elsevier Patient Education  2020 [employer]

## 2018-12-18 ENCOUNTER — Telehealth: Payer: Self-pay | Admitting: Student

## 2018-12-18 ENCOUNTER — Other Ambulatory Visit (HOSPITAL_COMMUNITY)
Admission: RE | Admit: 2018-12-18 | Discharge: 2018-12-18 | Disposition: A | Discharge status: Home / Self Care | Payer: Medicare Other | Source: Ambulatory Visit | Attending: Cardiology | Admitting: Cardiology

## 2018-12-18 ENCOUNTER — Other Ambulatory Visit: Payer: Self-pay

## 2018-12-18 ENCOUNTER — Encounter (HOSPITAL_COMMUNITY)
Admission: RE | Admit: 2018-12-18 | Discharge: 2018-12-18 | Disposition: A | Discharge status: Home / Self Care | Payer: Medicare Other | Source: Ambulatory Visit | Attending: Cardiology | Admitting: Cardiology

## 2018-12-18 ENCOUNTER — Encounter (HOSPITAL_COMMUNITY): Payer: Self-pay | Admitting: Emergency Medicine

## 2018-12-18 ENCOUNTER — Encounter: Payer: Self-pay | Admitting: Family Medicine

## 2018-12-18 ENCOUNTER — Encounter (HOSPITAL_COMMUNITY): Payer: Self-pay

## 2018-12-18 ENCOUNTER — Emergency Department (HOSPITAL_COMMUNITY)
Admission: EM | Admit: 2018-12-18 | Discharge: 2018-12-19 | Disposition: A | Discharge status: Home / Self Care | Payer: Medicare Other | Attending: Emergency Medicine | Admitting: Emergency Medicine

## 2018-12-18 DIAGNOSIS — Z79899 Other long term (current) drug therapy: Secondary | ICD-10-CM | POA: Insufficient documentation

## 2018-12-18 DIAGNOSIS — Z87891 Personal history of nicotine dependence: Secondary | ICD-10-CM | POA: Insufficient documentation

## 2018-12-18 DIAGNOSIS — Z8673 Personal history of transient ischemic attack (TIA), and cerebral infarction without residual deficits: Secondary | ICD-10-CM | POA: Insufficient documentation

## 2018-12-18 DIAGNOSIS — R55 Syncope and collapse: Secondary | ICD-10-CM | POA: Diagnosis not present

## 2018-12-18 DIAGNOSIS — Z7982 Long term (current) use of aspirin: Secondary | ICD-10-CM | POA: Diagnosis not present

## 2018-12-18 DIAGNOSIS — E1165 Type 2 diabetes mellitus with hyperglycemia: Secondary | ICD-10-CM | POA: Diagnosis not present

## 2018-12-18 DIAGNOSIS — I509 Heart failure, unspecified: Secondary | ICD-10-CM | POA: Diagnosis not present

## 2018-12-18 DIAGNOSIS — I110 Hypertensive heart disease with heart failure: Secondary | ICD-10-CM | POA: Insufficient documentation

## 2018-12-18 DIAGNOSIS — R404 Transient alteration of awareness: Secondary | ICD-10-CM | POA: Diagnosis not present

## 2018-12-18 DIAGNOSIS — Z794 Long term (current) use of insulin: Secondary | ICD-10-CM | POA: Insufficient documentation

## 2018-12-18 DIAGNOSIS — R739 Hyperglycemia, unspecified: Secondary | ICD-10-CM | POA: Diagnosis present

## 2018-12-18 DIAGNOSIS — I4891 Unspecified atrial fibrillation: Secondary | ICD-10-CM | POA: Diagnosis not present

## 2018-12-18 LAB — CBC WITH DIFFERENTIAL/PLATELET
Abs Immature Granulocytes: 0.02 10*3/uL (ref 0.00–0.07)
Basophils Absolute: 0 10*3/uL (ref 0.0–0.1)
Basophils Relative: 0 %
Eosinophils Absolute: 0.2 10*3/uL (ref 0.0–0.5)
Eosinophils Relative: 2 %
HCT: 42.2 % (ref 36.0–46.0)
Hemoglobin: 14.4 g/dL (ref 12.0–15.0)
Immature Granulocytes: 0 %
Lymphocytes Relative: 29 %
Lymphs Abs: 2.2 10*3/uL (ref 0.7–4.0)
MCH: 31.9 pg (ref 26.0–34.0)
MCHC: 34.1 g/dL (ref 30.0–36.0)
MCV: 93.4 fL (ref 80.0–100.0)
Monocytes Absolute: 0.6 10*3/uL (ref 0.1–1.0)
Monocytes Relative: 8 %
Neutro Abs: 4.6 10*3/uL (ref 1.7–7.7)
Neutrophils Relative %: 61 %
Platelets: 204 10*3/uL (ref 150–400)
RBC: 4.52 MIL/uL (ref 3.87–5.11)
RDW: 13.3 % (ref 11.5–15.5)
WBC: 7.6 10*3/uL (ref 4.0–10.5)
nRBC: 0 % (ref 0.0–0.2)

## 2018-12-18 LAB — BASIC METABOLIC PANEL
Anion gap: 10 (ref 5–15)
BUN: 17 mg/dL (ref 8–23)
CO2: 25 mmol/L (ref 22–32)
Calcium: 8.9 mg/dL (ref 8.9–10.3)
Chloride: 100 mmol/L (ref 98–111)
Creatinine, Ser: 0.95 mg/dL (ref 0.44–1.00)
GFR calc Af Amer: >60 mL/min (ref >60)
GFR calc non Af Amer: >60 mL/min (ref >60)
Glucose, Bld: 477 mg/dL — ABNORMAL HIGH (ref 70–99)
Potassium: 3.6 mmol/L (ref 3.5–5.1)
Sodium: 135 mmol/L (ref 135–145)

## 2018-12-18 LAB — CBG MONITORING, ED: Glucose-Capillary: 459 mg/dL — ABNORMAL HIGH (ref 70–99)

## 2018-12-18 MED ORDER — LACTATED RINGERS IV BOLUS
2000.0000 mL | Freq: Once | INTRAVENOUS | Status: AC
  Administered 2018-12-18: 2000 mL via INTRAVENOUS

## 2018-12-18 MED ORDER — INSULIN ASPART 100 UNIT/ML ~~LOC~~ SOLN
10.0000 [IU] | Freq: Once | SUBCUTANEOUS | Status: AC
  Administered 2018-12-19: 10 [IU] via INTRAVENOUS
  Filled 2018-12-18: qty 1

## 2018-12-18 NOTE — Telephone Encounter (Signed)
Attempt to reach, phone not working, cannot leave message
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
## 2018-12-18 NOTE — Telephone Encounter (Signed)
° °  Short stay called to let you know patient did not come in for pre-op appt

## 2018-12-18 NOTE — Telephone Encounter (Signed)
Call patients friend, Larina Earthly [phone] to r/s pt's apt for cardioversion.Pt's daughter daughter did not pick he up today and she missed apt
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
## 2018-12-18 NOTE — Pre-Procedure Instructions (Signed)
Spoke with [facility] to let them know Patient was a No show for her PAT.

## 2018-12-19 ENCOUNTER — Encounter (HOSPITAL_COMMUNITY): Admission: RE | Admit: 2018-12-19 | Payer: Medicare Other | Source: Ambulatory Visit

## 2018-12-19 ENCOUNTER — Encounter (HOSPITAL_COMMUNITY): Payer: Self-pay | Admitting: Anesthesiology

## 2018-12-19 ENCOUNTER — Other Ambulatory Visit (HOSPITAL_COMMUNITY)
Admission: RE | Admit: 2018-12-19 | Discharge: 2018-12-19 | Disposition: A | Discharge status: Home / Self Care | Payer: Medicare Other | Source: Ambulatory Visit | Attending: Cardiology | Admitting: Cardiology

## 2018-12-19 LAB — CBG MONITORING, ED: Glucose-Capillary: 401 mg/dL — ABNORMAL HIGH (ref 70–99)

## 2018-12-19 NOTE — Telephone Encounter (Signed)
Neighbor, Kim, states pt got home at 4 am and she thinks she is sleeping so she cannot bring her for Covid. I spoke with Scheduler in Short Stay , Hoyle Sauer, she is going o cancel cardioversion tomorrow

## 2018-12-19 NOTE — Telephone Encounter (Signed)
   Would see if this can be rescheduled for next week (COVID testing and DCCV) as long as she has not missed any doses of Eliquis. If she has missed doses, would again need to delay for 3 weeks. If DCCV after 12/30/2018, she will need an office visit for an updated H&P prior to the procedure as her last office visit was 11/29/2018.  Signed, Erma Heritage, PA-C 12/19/2018, 10:19 AM Pager: [phone]
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
## 2018-12-19 NOTE — Telephone Encounter (Signed)
   By review of notes, she was in the Emergency Department yesterday. EDP notes are pending but appears she was evaluated for hyperglycemia. Please see if her pre-op appt can be rescheduled. She remains in atrial fibrillation by review of the EKG performed yesterday.  Signed, Erma Heritage, PA-C 12/19/2018, 7:20 AM Pager: [phone]

## 2018-12-20 ENCOUNTER — Encounter (HOSPITAL_COMMUNITY): Admission: RE | Payer: Self-pay | Source: Ambulatory Visit

## 2018-12-20 ENCOUNTER — Ambulatory Visit (HOSPITAL_COMMUNITY): Admission: RE | Admit: 2018-12-20 | Payer: Medicare Other | Source: Ambulatory Visit | Admitting: Cardiology

## 2018-12-20 SURGERY — CARDIOVERSION
Anesthesia: Monitor Anesthesia Care

## 2018-12-20 NOTE — Telephone Encounter (Signed)
Left message for friend [name] to have have patient call us to r/s her cardioversion and to see if she missed her dose of eliquis the night she was in the ER

## 2018-12-22 NOTE — Telephone Encounter (Signed)
    That would therefore reset the timing of her DCCV until 3 weeks from 7/27. Given that this will be after 12/30/2018, she will need an updated H&P. Please see if her 01/26/2019 appointment can be moved up. Can be with myself or Dr. Bronson Ing.   Signed, Erma Heritage, PA-C 12/22/2018, 2:06 PM Pager: [phone]

## 2018-12-22 NOTE — Telephone Encounter (Signed)
Pt's neighbor Tina [name] called back and said patient DID miss her dose of Eliquis the night she was in the hospital and starting today 12/22/2018, the neighbor Tina [name] will make sure she has her daily medicines

## 2018-12-22 NOTE — Telephone Encounter (Signed)
Rescheduled H&P with B.Strader PA-C for 01/11/19 at 2 pm, I left message for friend Dwyane Dee [phone] with apt info

## 2019-01-01 ENCOUNTER — Other Ambulatory Visit: Payer: Self-pay | Admitting: Family Medicine

## 2019-01-07 ENCOUNTER — Telehealth: Payer: Self-pay | Admitting: Endocrinology

## 2019-01-07 NOTE — Telephone Encounter (Signed)
please contact patient: Ov is due 

## 2019-01-08 NOTE — Telephone Encounter (Signed)
Please refer to Dr. Ellison's request re: scheduling this pt for an appt 

## 2019-01-09 ENCOUNTER — Encounter: Payer: Self-pay | Admitting: Endocrinology
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
## 2019-01-11 ENCOUNTER — Encounter: Payer: Self-pay | Admitting: Student

## 2019-01-11 ENCOUNTER — Telehealth (INDEPENDENT_AMBULATORY_CARE_PROVIDER_SITE_OTHER): Payer: Medicare Other | Admitting: Student

## 2019-01-11 ENCOUNTER — Other Ambulatory Visit: Payer: Self-pay

## 2019-01-11 VITALS — Ht 59.0 in | Wt 194.2 lb

## 2019-01-11 DIAGNOSIS — I10 Essential (primary) hypertension: Secondary | ICD-10-CM

## 2019-01-11 DIAGNOSIS — E785 Hyperlipidemia, unspecified: Secondary | ICD-10-CM

## 2019-01-11 DIAGNOSIS — I5032 Chronic diastolic (congestive) heart failure: Secondary | ICD-10-CM | POA: Diagnosis not present

## 2019-01-11 DIAGNOSIS — I4819 Other persistent atrial fibrillation: Secondary | ICD-10-CM

## 2019-01-11 DIAGNOSIS — I6523 Occlusion and stenosis of bilateral carotid arteries: Secondary | ICD-10-CM

## 2019-01-11 NOTE — Patient Instructions (Signed)
Medication Instructions:  Your physician recommends that you continue on your current medications as directed. Please refer to the Current Medication list given to you today.  If you need a refill on your cardiac medications before your next appointment, please call your pharmacy.   Lab work: Your physician recommends that you return for lab work in: During Pre-op   If you have labs (blood work) drawn today and your tests are completely normal, you will receive your results only by: Marland Kitchen MyChart Message (if you have MyChart) OR . A paper copy in the mail If you have any lab test that is abnormal or we need to change your treatment, we will call you to review the results.  Testing/Procedures: Your physician has recommended that you have a Cardioversion (DCCV). Electrical Cardioversion uses a jolt of electricity to your heart either through paddles or wired patches attached to your chest. This is a controlled, usually prescheduled, procedure. Defibrillation is done under light anesthesia in the hospital, and you usually go home the day of the procedure. This is done to get your heart back into a normal rhythm. You are not awake for the procedure. Please see the instruction sheet given to you today.    Follow-Up: At [facility], you and your health needs are our priority.  As part of our continuing mission to provide you with exceptional heart care, we have created designated Provider Care Teams.  These Care Teams include your primary Cardiologist (physician) and Advanced Practice Providers (APPs -  Physician Assistants and Nurse Practitioners) who all work together to provide you with the care you need, when you need it. You will need a follow up appointment in 2 weeks after Cardioversion.  Please call our office 2 months in advance to schedule this appointment.  You may see Kate Sable, MD or one of the following Advanced Practice Providers on your designated Care Team:   Bernerd Pho,  PA-C [facility]) . Ermalinda Barrios, PA-C ([facility])  Any Other Special Instructions Will Be Listed Below (If Applicable). Thank you for choosing [facility]!
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
## 2019-01-12 ENCOUNTER — Telehealth: Payer: Self-pay | Admitting: Licensed Clinical Social Worker

## 2019-01-12 NOTE — Telephone Encounter (Signed)
CSW referred to assist patient with obtaining a BP cuff. CSW contacted patient to inform cuff will be delivered to home. Patient grateful for support and assistance. CSW available as needed. Jackie Ellianne Gowen, LCSW, CCSW-MCS [phone]  

## 2019-01-16 ENCOUNTER — Other Ambulatory Visit: Payer: Self-pay | Admitting: Family Medicine

## 2019-01-16 ENCOUNTER — Other Ambulatory Visit: Payer: Self-pay | Admitting: *Deleted

## 2019-01-16 MED ORDER — HYDROCODONE-ACETAMINOPHEN 5-325 MG PO TABS: 1.0000 | ORAL_TABLET | Freq: Two times a day (BID) | ORAL | 0 refills | Status: DC | PRN

## 2019-01-16 NOTE — Telephone Encounter (Signed)
Received call from patient.   Requested refill on Hydrocodone/APAP.  Ok to refill??  Last office visit 12/11/2018.  Last refill 11/27/2018.

## 2019-01-17 ENCOUNTER — Telehealth: Payer: Self-pay | Admitting: *Deleted

## 2019-01-17 NOTE — Telephone Encounter (Signed)
Received form from [facility] for medical review.   Per MD, schedule for telephone visit for Friday to review.   Attempted to call patient and patient daughter [name] with no answer and no VM to leave message.   Of note, person bills noted in envelope with medical review forms. Not sure if these were meant to be included.

## 2019-01-18 ENCOUNTER — Telehealth: Payer: Self-pay | Admitting: Cardiovascular Disease

## 2019-01-18 ENCOUNTER — Other Ambulatory Visit: Payer: Self-pay | Admitting: *Deleted

## 2019-01-18 MED ORDER — LOSARTAN POTASSIUM 25 MG PO TABS: 25.0000 mg | ORAL_TABLET | Freq: Every day | ORAL | 2 refills | Status: AC

## 2019-01-18 NOTE — Telephone Encounter (Signed)
Cal placed to patient.   States that personal bills were not meant to be included.   Agreeable to call with PCP on 01/19/2019. Requested MD call patient daughter first and daughter will be able to add patient into call.   MD to be made aware.

## 2019-01-18 NOTE — Telephone Encounter (Signed)
Noted  

## 2019-01-18 NOTE — Telephone Encounter (Signed)
Returned call to [place]. No answer, voicemail full.

## 2019-01-18 NOTE — Telephone Encounter (Signed)
Please give pt's daughter Ailene Ravel a call @ [phone]

## 2019-01-19 ENCOUNTER — Encounter: Payer: Self-pay | Admitting: Family Medicine

## 2019-01-19 ENCOUNTER — Other Ambulatory Visit: Payer: Self-pay

## 2019-01-19 ENCOUNTER — Ambulatory Visit (INDEPENDENT_AMBULATORY_CARE_PROVIDER_SITE_OTHER): Payer: Medicare Other | Admitting: Family Medicine

## 2019-01-19 DIAGNOSIS — I6930 Unspecified sequelae of cerebral infarction: Secondary | ICD-10-CM | POA: Diagnosis not present

## 2019-01-19 DIAGNOSIS — E11649 Type 2 diabetes mellitus with hypoglycemia without coma: Secondary | ICD-10-CM | POA: Diagnosis not present

## 2019-01-19 DIAGNOSIS — I4819 Other persistent atrial fibrillation: Secondary | ICD-10-CM
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
## 2019-01-19 NOTE — Patient Instructions (Signed)
     Tina [name]  01/19/2019     @PREFPERIOPPHARMACY @   Your procedure is scheduled on   01/25/2019  Report to [facility] at  0800  A.M.  Call this number if you have problems the morning of surgery:  [phone]   Remember:  Do not eat or drink after midnight.                        Take these medicines the morning of surgery with A SIP OF WATER  Hydrocodone(if needed), protonix, paxil. DO NOT take any medications for diabetes the morning of your procedure.    Do not wear jewelry, make-up or nail polish.  Do not wear lotions, powders, or perfumes. Please wear deodorant and brush your teeth.  Do not shave 48 hours prior to surgery.  Men may shave face and neck.  Do not bring valuables to the hospital.  [facility] is not responsible for any belongings or valuables.  Contacts, dentures or bridgework may not be worn into surgery.  Leave your suitcase in the car.  After surgery it may be brought to your room.  For patients admitted to the hospital, discharge time will be determined by your treatment team.  Patients discharged the day of surgery will not be allowed to drive home.   Name and phone number of your driver:   family Special instructions:  DO NOT MISS ANY DOSES OF YOUR ELIQUIS  Please read over the following fact sheets that you were given. Anesthesia Post-op Instructions and Care and Recovery After Surgery      Electrical Cardioversion, Care After This sheet gives you information about how to care for yourself after your procedure. Your health care provider may also give you more specific instructions. If you have problems or questions, contact your health care provider. What can I expect after the procedure? After the procedure, it is common to have:  Some redness on the skin where the shocks were given. Follow these instructions at home:   Do not drive for 24 hours if you were given a medicine to help you relax (sedative).  Take over-the-counter and  prescription medicines only as told by your health care provider.  Ask your health care provider how to check your pulse. Check it often.  Rest for 48 hours after the procedure or as told by your health care provider.  Avoid or limit your caffeine use as told by your health care provider. Contact a health care provider if:  You feel like your heart is beating too quickly or your pulse is not regular.  You have a serious muscle cramp that does not go away. Get help right away if:   You have discomfort in your chest.  You are dizzy or you feel faint.  You have trouble breathing or you are short of breath.  Your speech is slurred.  You have trouble moving an arm or leg on one side of your body.  Your fingers or toes turn cold or blue. This information is not intended to replace advice given to you by your health care provider. Make sure you discuss any questions you have with your health care provider. Document Released: 02/28/2013 Document Revised: 04/22/2017 Document Reviewed: 11/14/2015 Elsevier Patient Education  2020 [employer].

## 2019-01-22 ENCOUNTER — Other Ambulatory Visit: Payer: Self-pay

## 2019-01-22 ENCOUNTER — Other Ambulatory Visit (HOSPITAL_COMMUNITY)
Admission: RE | Admit: 2019-01-22 | Discharge: 2019-01-22 | Disposition: A | Discharge status: Home / Self Care | Payer: Medicare Other | Source: Ambulatory Visit | Attending: Cardiology | Admitting: Cardiology

## 2019-01-22 ENCOUNTER — Encounter (HOSPITAL_COMMUNITY)
Admission: RE | Admit: 2019-01-22 | Discharge: 2019-01-22 | Disposition: A | Discharge status: Home / Self Care | Payer: Medicare Other | Source: Ambulatory Visit | Attending: Cardiology | Admitting: Cardiology

## 2019-01-22 DIAGNOSIS — E119 Type 2 diabetes mellitus without complications: Secondary | ICD-10-CM | POA: Diagnosis not present

## 2019-01-22 DIAGNOSIS — I5032 Chronic diastolic (congestive) heart failure: Secondary | ICD-10-CM | POA: Diagnosis not present

## 2019-01-22 DIAGNOSIS — Z8673 Personal history of transient ischemic attack (TIA), and cerebral infarction without residual deficits: Secondary | ICD-10-CM | POA: Diagnosis not present

## 2019-01-22 DIAGNOSIS — G4733 Obstructive sleep apnea (adult) (pediatric): Secondary | ICD-10-CM | POA: Diagnosis not present

## 2019-01-22 DIAGNOSIS — K219 Gastro-esophageal reflux disease without esophagitis: Secondary | ICD-10-CM | POA: Diagnosis not present

## 2019-01-22 DIAGNOSIS — Z79899 Other long term (current) drug therapy: Secondary | ICD-10-CM | POA: Diagnosis not present

## 2019-01-22 DIAGNOSIS — I6529 Occlusion and stenosis of unspecified carotid artery: Secondary | ICD-10-CM | POA: Diagnosis not present

## 2019-01-22 DIAGNOSIS — Z9119 Patient's noncompliance with other medical treatment and regimen: Secondary | ICD-10-CM | POA: Diagnosis not present

## 2019-01-22 DIAGNOSIS — I4819 Other persistent atrial fibrillation: Secondary | ICD-10-CM | POA: Diagnosis not present

## 2019-01-22 DIAGNOSIS — E785 Hyperlipidemia, unspecified: Secondary | ICD-10-CM | POA: Diagnosis not present

## 2019-01-22 DIAGNOSIS — F329 Major depressive disorder, single episode, unspecified: Secondary | ICD-10-CM | POA: Diagnosis not present

## 2019-01-22 DIAGNOSIS — Z7982 Long term (current) use of aspirin: Secondary | ICD-10-CM | POA: Diagnosis not present

## 2019-01-22 DIAGNOSIS — Z794 Long term (current) use of insulin: Secondary | ICD-10-CM | POA: Diagnosis not present

## 2019-01-22 DIAGNOSIS — Z01812 Encounter for preprocedural laboratory examination: Secondary | ICD-10-CM | POA: Insufficient documentation

## 2019-01-22 DIAGNOSIS — I110 Hypertensive heart disease with heart failure: Secondary | ICD-10-CM | POA: Diagnosis not present

## 2019-01-22 DIAGNOSIS — Z7901 Long term (current) use of anticoagulants: Secondary | ICD-10-CM | POA: Diagnosis not present

## 2019-01-22 LAB — BASIC METABOLIC PANEL
Anion gap: 10 (ref 5–15)
BUN: 23 mg/dL (ref 8–23)
CO2: 24 mmol/L (ref 22–32)
Calcium: 9 mg/dL (ref 8.9–10.3)
Chloride: 101 mmol/L (ref 98–111)
Creatinine, Ser: 0.93 mg/dL (ref 0.44–1.00)
GFR calc Af Amer: >60 mL/min (ref >60)
GFR calc non Af Amer: >60 mL/min (ref >60)
Glucose, Bld: 452 mg/dL — ABNORMAL HIGH (ref 70–99)
Potassium: 3.9 mmol/L (ref 3.5–5.1)
Sodium: 135 mmol/L (ref 135–145)

## 2019-01-22 LAB — GLUCOSE, CAPILLARY: Glucose-Capillary: 499 mg/dL — ABNORMAL HIGH (ref 70–99)

## 2019-01-22 LAB — HEMOGLOBIN A1C
Hgb A1c MFr Bld: 11.1 % — ABNORMAL HIGH (ref 4.8–5.6)
Mean Plasma Glucose: 271.87 mg/dL

## 2019-01-22 LAB — SARS CORONAVIRUS 2 (TAT 6-24 HRS): SARS Coronavirus 2: NEGATIVE

## 2019-01-23 NOTE — Progress Notes (Signed)
This is a patient of Dr. Bronson Ing.  She has been scheduled for an elective cardioversion with me and lab work was ordered under my name.  It is obvious and glucose is poorly controlled, I am forwarding these results to her PCP Dr. Buelah Manis for further management.

## 2019-01-23 NOTE — Pre-Procedure Instructions (Signed)
HgbA1C routed to PCP and Dr Domenic Polite.

## 2019-01-25 ENCOUNTER — Ambulatory Visit (HOSPITAL_COMMUNITY): Payer: Medicare Other | Admitting: Anesthesiology

## 2019-01-25 ENCOUNTER — Encounter (HOSPITAL_COMMUNITY)
Admission: RE | Disposition: A | Discharge status: Home / Self Care | Payer: Self-pay | Source: Ambulatory Visit | Attending: Cardiology

## 2019-01-25 ENCOUNTER — Encounter (HOSPITAL_COMMUNITY): Payer: Self-pay | Admitting: *Deleted

## 2019-01-25 ENCOUNTER — Other Ambulatory Visit: Payer: Self-pay

## 2019-01-25 ENCOUNTER — Ambulatory Visit (HOSPITAL_COMMUNITY)
Admission: RE | Admit: 2019-01-25 | Discharge: 2019-01-25 | Disposition: A | Discharge status: Home / Self Care | Payer: Medicare Other | Source: Ambulatory Visit | Attending: Cardiology | Admitting: Cardiology

## 2019-01-25 DIAGNOSIS — K219 Gastro-esophageal reflux disease without esophagitis: Secondary | ICD-10-CM | POA: Insufficient documentation

## 2019-01-25 DIAGNOSIS — Z794 Long term (current) use of insulin: Secondary | ICD-10-CM | POA: Insufficient documentation

## 2019-01-25 DIAGNOSIS — G4733 Obstructive sleep apnea (adult) (pediatric): Secondary | ICD-10-CM | POA: Insufficient documentation

## 2019-01-25 DIAGNOSIS — I110 Hypertensive heart disease with heart failure: Secondary | ICD-10-CM | POA: Diagnosis not present

## 2019-01-25 DIAGNOSIS — Z79899 Other long term (current) drug therapy: Secondary | ICD-10-CM | POA: Insufficient documentation

## 2019-01-25 DIAGNOSIS — I4819 Other persistent atrial fibrillation: Secondary | ICD-10-CM

## 2019-01-25 DIAGNOSIS — I6529 Occlusion and stenosis of unspecified carotid artery: Secondary | ICD-10-CM | POA: Diagnosis not present

## 2019-01-25 DIAGNOSIS — E785 Hyperlipidemia, unspecified: Secondary | ICD-10-CM | POA: Diagnosis not present

## 2019-01-25 DIAGNOSIS — Z8673 Personal history of transient ischemic attack (TIA), and cerebral infarction without residual deficits: Secondary | ICD-10-CM | POA: Insufficient documentation

## 2019-01-25 DIAGNOSIS — I5032 Chronic diastolic (congestive) heart failure: Secondary | ICD-10-CM | POA: Diagnosis not present

## 2019-01-25 DIAGNOSIS — E119 Type 2 diabetes mellitus without complications: Secondary | ICD-10-CM | POA: Diagnosis not present

## 2019-01-25 DIAGNOSIS — Z7982 Long term (current) use of aspirin: Secondary | ICD-10-CM | POA: Insufficient documentation

## 2019-01-25 DIAGNOSIS — F329 Major depressive disorder, single episode, unspecified: Secondary | ICD-10-CM | POA: Insufficient documentation

## 2019-01-25 DIAGNOSIS — Z7901 Long term (current) use of anticoagulants: Secondary | ICD-10-CM | POA: Insufficient documentation

## 2019-01-25 DIAGNOSIS — Z9119 Patient's noncompliance with other medical treatment and regimen: Secondary | ICD-10-CM | POA: Insufficient documentation

## 2019-01-25 HISTORY — PX: CARDIOVERSION: SHX1299

## 2019-01-25 LAB — GLUCOSE, CAPILLARY
Glucose-Capillary: 109 mg/dL — ABNORMAL HIGH (ref 70–99)
Glucose-Capillary: 109 mg/dL — ABNORMAL HIGH (ref 70–99)
Glucose-Capillary: 72 mg/dL (ref 70–99)

## 2019-01-25 SURGERY — CARDIOVERSION
Anesthesia: General

## 2019-01-25 MED ORDER — HYDROMORPHONE HCL 1 MG/ML IJ SOLN: 0.2500 mg | INTRAMUSCULAR | Status: DC | PRN

## 2019-01-25 MED ORDER — LACTATED RINGERS IV SOLN
INTRAVENOUS | Status: DC
  Administered 2019-01-25: 09:00:00 via INTRAVENOUS

## 2019-01-25 MED ORDER — HYDROCODONE-ACETAMINOPHEN 7.5-325 MG PO TABS: 1.0000 | ORAL_TABLET | Freq: Once | ORAL | Status: DC | PRN

## 2019-01-25 MED ORDER — PROPOFOL 10 MG/ML IV BOLUS
INTRAVENOUS | Status: DC | PRN
  Administered 2019-01-25: 10 mg via INTRAVENOUS
  Administered 2019-01-25: 20 mg via INTRAVENOUS
  Administered 2019-01-25: 30 mg via INTRAVENOUS
  Administered 2019-01-25: 20 mg via INTRAVENOUS

## 2019-01-25 MED ORDER — PROMETHAZINE HCL 25 MG/ML IJ SOLN: 6.2500 mg | INTRAMUSCULAR | Status: DC | PRN

## 2019-01-25 MED ORDER — DEXTROSE 50 % IV SOLN
INTRAVENOUS | Status: DC | PRN
  Administered 2019-01-25: 20 mL via INTRAVENOUS

## 2019-01-25 MED ORDER — DEXTROSE 50 % IV SOLN
INTRAVENOUS | Status: AC
  Filled 2019-01-25: qty 50

## 2019-01-25 MED ORDER — MIDAZOLAM HCL 2 MG/2ML IJ SOLN: 0.5000 mg | Freq: Once | INTRAMUSCULAR | Status: DC | PRN

## 2019-01-25 NOTE — Interval H&P Note (Signed)
History and Physical Interval Note:  01/25/2019 9:39 AM  Patient seen and examined. History reviewed. She presents for elective cardioversion today, consent obtained. She is in atrial fibrillation by ECG, heart rate controlled. Follow-up glucose today 109. Risks and benefits discussed and she is ready to proceed.  Satira Sark, M.D., F.A.C.C.

## 2019-01-25 NOTE — Anesthesia Preprocedure Evaluation (Signed)
Anesthesia Evaluation  Patient identified by MRN, date of birth, ID band Patient awake    Reviewed: Allergy & Precautions, NPO status , Patient's Chart, lab work & pertinent test results  Airway Mallampati: II  TM Distance: >3 FB Neck ROM: Full    Dental no notable dental hx. (+) Teeth Intact   Pulmonary shortness of breath and with exertion, sleep apnea and Continuous Positive Airway Pressure Ventilation , former smoker,    Pulmonary exam normal breath sounds clear to auscultation       Cardiovascular Exercise Tolerance: Poor hypertension, Pt. on medications and Pt. on home beta blockers +CHF  Normal cardiovascular examII Rhythm:Regular Rate:Normal     Neuro/Psych Anxiety Depression  Neuromuscular disease CVA, Residual Symptoms negative psych ROS   GI/Hepatic Neg liver ROS, GERD  Medicated and Controlled,  Endo/Other  diabetes, Poorly Controlled, Type 1, Insulin DependentMorbid obesity  Renal/GU negative Renal ROS  negative genitourinary   Musculoskeletal  (+) Fibromyalgia -  Abdominal   Peds negative pediatric ROS (+)  Hematology negative hematology ROS (+)   Anesthesia Other Findings   Reproductive/Obstetrics negative OB ROS                             Anesthesia Physical Anesthesia Plan  ASA: IV  Anesthesia Plan: General   Post-op Pain Management:    Induction: Intravenous  PONV Risk Score and Plan: 3 and TIVA, Propofol infusion, Treatment may vary due to age or medical condition and Ondansetron  Airway Management Planned: Nasal Cannula and Simple Face Mask  Additional Equipment:   Intra-op Plan:   Post-operative Plan:   Informed Consent: I have reviewed the patients History and Physical, chart, labs and discussed the procedure including the risks, benefits and alternatives for the proposed anesthesia with the patient or authorized representative who has indicated his/her  understanding and acceptance.     Dental advisory given  Plan Discussed with: CRNA  Anesthesia Plan Comments: (Plan Full PPE Plan MAC as tolerated vs GA/GETA as needed d/w pt -WTP with same after Q&A)        Anesthesia Quick Evaluation

## 2019-01-25 NOTE — Progress Notes (Signed)
Electrical Cardioversion Procedure Note BLAKELEE ALLINGTON [id number] 05/07/1951  Procedure: Electrical Cardioversion Indications:  Atrial Fibrillation  Procedure Details Consent: Risks of procedure as well as the alternatives and risks of each were explained to the (patient/caregiver).  Consent for procedure obtained. Time Out: Verified patient identification, verified procedure, site/side was marked, verified correct patient position, special equipment/implants available, medications/allergies/relevent history reviewed, required imaging and test results available.  Performed  Patient placed on cardiac monitor, pulse oximetry, supplemental oxygen as necessary.  Sedation given: propofol Pacer pads placed anterior and posterior chest.  Cardioverted 2 time(s). 2423 with 120J and [id number] with 200J Cardioverted at [place].  Evaluation Findings: Post procedure EKG shows: NSR Complications: None Patient did tolerate procedure well.   Cathie Olden 01/25/2019, 10:21 AM

## 2019-01-25 NOTE — Transfer of Care (Signed)
Immediate Anesthesia Transfer of Care Note  Patient: Tina [name]  Procedure(s) Performed: CARDIOVERSION (N/A )  Patient Location: PACU  Anesthesia Type:MAC  Level of Consciousness: awake and alert   Airway & Oxygen Therapy: Patient Spontanous Breathing  Post-op Assessment: Report given to RN  Post vital signs: Reviewed and stable  Last Vitals:  Vitals Value Taken Time  BP    Temp    Pulse    Resp    SpO2      Last Pain:  Vitals:   01/25/19 0841  TempSrc: Oral  PainSc: 0-No pain      Patients Stated Pain Goal: 6 (79/89/21 1941)  Complications: No apparent anesthesia complications

## 2019-01-25 NOTE — Anesthesia Postprocedure Evaluation (Signed)
Anesthesia Post Note  Patient: Tina [name]  Procedure(s) Performed: CARDIOVERSION (N/A )  Patient location during evaluation: PACU Anesthesia Type: General Level of consciousness: awake and alert Pain management: pain level controlled Vital Signs Assessment: post-procedure vital signs reviewed and stable Respiratory status: spontaneous breathing Cardiovascular status: blood pressure returned to baseline and stable Postop Assessment: no apparent nausea or vomiting Anesthetic complications: no     Last Vitals:  Vitals:   01/25/19 0841 01/25/19 1005  BP: 128/85 104/62  Pulse: 77 61  Resp: 18 20  Temp: 37 C   SpO2: 97% 97%    Last Pain:  Vitals:   01/25/19 0841  TempSrc: Oral  PainSc: 0-No pain                 Michelina Mexicano

## 2019-01-25 NOTE — CV Procedure (Signed)
Elective direct-current cardioversion  Indication: Persistent atrial fibrillation  Description of procedure: Informed consent was obtained.  Patient taken to the PACU where a timeout was performed.  Anterior and posterior pads were placed and connected to a biphasic defibrillator.  Deep sedation was achieved with use of propofol via the anesthesia service with monitoring throughout.  Sandbag was placed on anterior chest pad.  A single synchronized 120 J shock was delivered initially with return to atrial fibrillation.  A second synchronized 200 J shock was then delivered, followed by prolonged pauses and significant bradycardia and then ultimately settling into sinus rhythm in the 60s.  Patient remained hemodynamically stable without immediate complications.  Rhythm confirmed by ECG.  Satira Sark, M.D., F.A.C.C.

## 2019-01-25 NOTE — Discharge Instructions (Signed)
Monitored Anesthesia Care, Care After °These instructions provide you with information about caring for yourself after your procedure. Your health care provider may also give you more specific instructions. Your treatment has been planned according to current medical practices, but problems sometimes occur. Call your health care provider if you have any problems or questions after your procedure. °What can I expect after the procedure? °After your procedure, you may: °· Feel sleepy for several hours. °· Feel clumsy and have poor balance for several hours. °· Feel forgetful about what happened after the procedure. °· Have poor judgment for several hours. °· Feel nauseous or vomit. °· Have a sore throat if you had a breathing tube during the procedure. °Follow these instructions at home: °For at least 24 hours after the procedure: ° °  ° °· Have a responsible adult stay with you. It is important to have someone help care for you until you are awake and alert. °· Rest as needed. °· Do not: °? Participate in activities in which you could fall or become injured. °? Drive. °? Use heavy machinery. °? Drink alcohol. °? Take sleeping pills or medicines that cause drowsiness. °? Make important decisions or sign legal documents. °? Take care of children on your own. °Eating and drinking °· Follow the diet that is recommended by your health care provider. °· If you vomit, drink water, juice, or soup when you can drink without vomiting. °· Make sure you have little or no nausea before eating solid foods. °General instructions °· Take over-the-counter and prescription medicines only as told by your health care provider. °· If you have sleep apnea, surgery and certain medicines can increase your risk for breathing problems. Follow instructions from your health care provider about wearing your sleep device: °? Anytime you are sleeping, including during daytime naps. °? While taking prescription pain medicines, sleeping medicines,  or medicines that make you drowsy. °· If you smoke, do not smoke without supervision. °· Keep all follow-up visits as told by your health care provider. This is important. °Contact a health care provider if: °· You keep feeling nauseous or you keep vomiting. °· You feel light-headed. °· You develop a rash. °· You have a fever. °Get help right away if: °· You have trouble breathing. °Summary °· For several hours after your procedure, you may feel sleepy and have poor judgment. °· Have a responsible adult stay with you for at least 24 hours or until you are awake and alert. °This information is not intended to replace advice given to you by your health care provider. Make sure you discuss any questions you have with your health care provider. °Document Released: 08/31/2015 Document Revised: 08/08/2017 Document Reviewed: 08/31/2015 °Elsevier Patient Education © 2020 Elsevier Inc[employer] °Electrical Cardioversion, Care After °This sheet gives you information about how to care for yourself after your procedure. Your health care provider may also give you more specific instructions. If you have problems or questions, contact your health care provider. °What can I expect after the procedure? °After the procedure, it is common to have: °· Some redness on the skin where the shocks were given. °Follow these instructions at home: ° °· Do not drive for 24 hours if you were given a medicine to help you relax (sedative). °· Take over-the-counter and prescription medicines only as told by your health care provider. °· Ask your health care provider how to check your pulse. Check it often. °· Rest for 48 hours after the procedure or as told   by your health care provider. °· Avoid or limit your caffeine use as told by your health care provider. °Contact a health care provider if: °· You feel like your heart is beating too quickly or your pulse is not regular. °· You have a serious muscle cramp that does not go away. °Get help right away  if: ° °· You have discomfort in your chest. °· You are dizzy or you feel faint. °· You have trouble breathing or you are short of breath. °· Your speech is slurred. °· You have trouble moving an arm or leg on one side of your body. °· Your fingers or toes turn cold or blue. °This information is not intended to replace advice given to you by your health care provider. Make sure you discuss any questions you have with your health care provider. °Document Released: 02/28/2013 Document Revised: 04/22/2017 Document Reviewed: 11/14/2015 °Elsevier Patient Education © 2020 Else[employer] Inc. ° °

## 2019-01-26 ENCOUNTER — Ambulatory Visit: Payer: Medicare Other | Admitting: Student

## 2019-01-30 ENCOUNTER — Encounter (HOSPITAL_COMMUNITY): Payer: Self-pay | Admitting: Cardiology

## 2019-02-08 ENCOUNTER — Encounter: Payer: Self-pay | Admitting: Student

## 2019-02-08 ENCOUNTER — Ambulatory Visit (INDEPENDENT_AMBULATORY_CARE_PROVIDER_SITE_OTHER): Payer: Medicare Other | Admitting: Student

## 2019-02-08 ENCOUNTER — Other Ambulatory Visit: Payer: Self-pay

## 2019-02-08 VITALS — BP 146/93 | HR 69 | Temp 97.1°F | Ht 60.0 in | Wt 197.0 lb

## 2019-02-08 DIAGNOSIS — I6523 Occlusion and stenosis of bilateral carotid arteries: Secondary | ICD-10-CM | POA: Diagnosis not present

## 2019-02-08 DIAGNOSIS — I5032 Chronic diastolic (congestive) heart failure: Secondary | ICD-10-CM

## 2019-02-08 DIAGNOSIS — E782 Mixed hyperlipidemia: Secondary | ICD-10-CM | POA: Diagnosis not present

## 2019-02-08 DIAGNOSIS — I10 Essential (primary) hypertension: Secondary | ICD-10-CM | POA: Diagnosis not present

## 2019-02-08 DIAGNOSIS — I4819 Other persistent atrial fibrillation: Secondary | ICD-10-CM

## 2019-02-08 NOTE — Progress Notes (Signed)
Cardiology Office Note    Date:  02/09/2019   ID:  Tina [name], DOB [birth year], MRN [id number]  PCP:  Alycia Rossetti, MD  Cardiologist: Kate Sable, MD    Chief Complaint  Patient presents with  . Follow-up    s/p DCCV    History of Present Illness:    Tina [name] is a 68 y.o. female with past medical history of persistent atrial fibrillation (diagnoised in 05/2017 with return to NSR by EKG in 09/2017 but recurring in 06/2018), chronic diastolic CHF, HTN, HLD, Type 2 DM, prior vertebral artery dissection, prior CVA, and OSA who presents to the office today for follow-up from her recent DCCV.   She has been followed by myself and Dr. Bronson Ing for the past several months in regards to worsening dyspnea on exertion and fatigue. DCCV's were arranged multiple times but either postponed due to COVID or the patient missing doses of Eliquis. At the time of her telehealth visit on 01/11/2019 the procedure was again reviewed and she agreed to proceed. Was continued on her current medication regimen including Torsemide 40mg  BID, Lopressor 25mg  BID, and Eliquis. She underwent DCCV on 01/25/2019 by Dr. Domenic Polite with return to NSR following a second synchronized shock at 200 J. She did have prolonged pauses immediately following the procedure but ultimately went back into NSR with rates in the 60's.   In talking with the patient today, she reports she initially experienced significant improvement in her fatigue and dyspnea following DCCV but starting this past weekend her symptoms returned. She is now short of breath when walking around her home. Denies any specific orthopnea, PND, or edema. Weight has increased on her home scales over the past few days but she informs me she has only been taking Torsemide once daily instead of twice daily.   Past Medical History:  Diagnosis Date  . Atrial fibrillation ([place])    a. diagnosed in 05/2017 --> started on Eliquis for anticoagulation  .  Carotid artery stenosis   . CHF (congestive heart failure) ([place])   . Depression   . Diabetes mellitus   . Fibromyalgia   . GERD (gastroesophageal reflux disease)   . Hyperlipidemia   . Hypertension   . Stroke ([place])   . Superficial thrombophlebitis    right leg    Past Surgical History:  Procedure Laterality Date  . CARDIOVERSION N/A 01/25/2019   Procedure: CARDIOVERSION;  Surgeon: Satira Sark, MD;  Location: [facility];  Service: Cardiovascular;  Laterality: N/A;  . CHOLECYSTECTOMY  1991  . HERNIA REPAIR  approx 1969  . NECK SURGERY  1998  . TONSILLECTOMY AND ADENOIDECTOMY  1966    Current Medications: Outpatient Medications Prior to Visit  Medication Sig Dispense Refill  . aspirin EC 81 MG EC tablet Take 1 tablet (81 mg total) by mouth daily. (Patient taking differently: Take 81 mg by mouth at bedtime. ) 30 tablet 4  . cetirizine (ZYRTEC) 10 MG tablet Take 10 mg by mouth daily.    Marland Kitchen ELIQUIS 5 MG TABS tablet Take 1 tablet by mouth twice daily (Patient taking differently: Take 5 mg by mouth 2 (two) times daily. ) 60 tablet 2  . fenofibrate 54 MG tablet Take 1 tablet by mouth once daily (Patient taking differently: Take 54 mg by mouth daily. ) 90 tablet 0  . HYDROcodone-acetaminophen (NORCO) 5-325 MG tablet Take 1 tablet by mouth 2 (two) times daily as needed for moderate pain. 30 tablet 0  . Insulin  Glargine (BASAGLAR KWIKPEN) 100 UNIT/ML SOPN Inject 1.2 mLs (120 Units total) into the skin every morning. 100 mL 1  . losartan (COZAAR) 25 MG tablet Take 1 tablet (25 mg total) by mouth daily. 90 tablet 2  . metoprolol tartrate (LOPRESSOR) 25 MG tablet Take 1 tablet (25 mg total) by mouth 2 (two) times daily. 180 tablet 0  . pantoprazole (PROTONIX) 40 MG tablet Take 1 tablet (40 mg total) by mouth daily. 90 tablet 1  . PARoxetine (PAXIL) 40 MG tablet TAKE 1 TABLET BY MOUTH IN THE MORNING (Patient taking differently: Take 40 mg by mouth daily. TAKE 1 TABLET BY MOUTH IN THE MORNING) 90  tablet 0  . potassium chloride SA (K-DUR) 20 MEQ tablet Take 1 tablet (20 mEq total) by mouth daily. (Patient taking differently: Take 20 mEq by mouth daily as needed. ) 90 tablet 1  . rosuvastatin (CRESTOR) 40 MG tablet Take 1 tablet by mouth once daily 90 tablet 0  . torsemide (DEMADEX) 20 MG tablet Take 2 tablets (40 mg total) by mouth 2 (two) times daily. 120 tablet 6  . Omega-3 Fatty Acids (FISH OIL) 1000 MG CAPS Take 2 capsules (2,000 mg total) by mouth 2 (two) times daily. (Patient taking differently: Take 1 capsule by mouth at bedtime. ) 360 capsule 3   No facility-administered medications prior to visit.      Allergies:   Cefuroxime axetil, Fentanyl and related, Niaspan [niacin er], Sulfa antibiotics, Vasotec [enalapril], Welchol [colesevelam hcl], Lyrica [pregabalin], and Penicillins   Social History   Socioeconomic History  . Marital status: Married    Spouse name: Not on file  . Number of children: Not on file  . Years of education: Not on file  . Highest education level: Not on file  Occupational History  . Not on file  Social Needs  . Financial resource strain: Not hard at all  . Food insecurity    Worry: Never true    Inability: Never true  . Transportation needs    Medical: No    Non-medical: No  Tobacco Use  . Smoking status: Former Smoker    Types: Cigarettes  . Smokeless tobacco: Never Used  Substance and Sexual Activity  . Alcohol use: No  . Drug use: No  . Sexual activity: Not Currently  Lifestyle  . Physical activity    Days per week: Not on file    Minutes per session: Not on file  . Stress: Not on file  Relationships  . Social Musicianconnections    Talks on phone: Not on file    Gets together: Not on file    Attends religious service: Not on file    Active member of club or organization: Not on file    Attends meetings of clubs or organizations: Not on file    Relationship status: Not on file  Other Topics Concern  . Not on file  Social History  Narrative  . Not on file     Family History:  The patient's family history includes Diabetes in her daughter and maternal grandmother; Heart disease in her mother and sister; Hyperlipidemia in her mother and sister; Hypertension in her mother and sister; Stroke in her sister.   Review of Systems:   Please see the history of present illness.     General:  No chills, fever, night sweats or weight changes. Positive for fatigue.  Cardiovascular:  No chest pain, edema, orthopnea, palpitations, paroxysmal nocturnal dyspnea. Positive for dyspnea on exertion.  Dermatological: No rash, lesions/masses Respiratory: No cough, dyspnea Urologic: No hematuria, dysuria Abdominal:   No nausea, vomiting, diarrhea, bright red blood per rectum, melena, or hematemesis Neurologic:  No visual changes, wkns, changes in mental status. All other systems reviewed and are otherwise negative except as noted above.   Physical Exam:    VS:  BP (!) 146/93   Pulse 69   Temp (!) 97.1 F (36.2 C)   Ht 5' (1.524 m)   Wt 197 lb (89.4 kg)   BMI 38.47 kg/m    General: Well developed, well nourished,female appearing in no acute distress. Head: Normocephalic, atraumatic, sclera non-icteric, no xanthomas, nares are without discharge.  Neck: No carotid bruits. JVD not elevated.  Lungs: Respirations regular and unlabored, without wheezes or rales.  Heart: Irregularly irregular. No S3 or S4.  No murmur, no rubs, or gallops appreciated. Abdomen: Soft, non-tender, non-distended with normoactive bowel sounds. No hepatomegaly. No rebound/guarding. No obvious abdominal masses. Msk:  Strength and tone appear normal for age. No joint deformities or effusions. Extremities: No clubbing or cyanosis. No lower extremity edema.  Distal pedal pulses are 2+ bilaterally. Neuro: Alert and oriented X 3. Moves all extremities spontaneously. No focal deficits noted. Psych:  Responds to questions appropriately with a normal affect. Skin: No  rashes or lesions noted  Wt Readings from Last 3 Encounters:  02/08/19 197 lb (89.4 kg)  01/22/19 198 lb (89.8 kg)  01/11/19 194 lb 3.2 oz (88.1 kg)     Studies/Labs Reviewed:   EKG:  EKG is ordered today.  The ekg ordered today demonstrates rate-controlled atrial fibrillation, HR 88.  Recent Labs: 07/04/2018: B Natriuretic Peptide 386.9 07/05/2018: Magnesium 1.9 12/11/2018: ALT 18 12/18/2018: Hemoglobin 14.4; Platelets 204 01/22/2019: BUN 23; Creatinine, Ser 0.93; Potassium 3.9; Sodium 135   Lipid Panel    Component Value Date/Time   CHOL 167 12/11/2018 1445   TRIG 273 (H) 12/11/2018 1445   HDL 40 (L) 12/11/2018 1445   CHOLHDL 4.2 12/11/2018 1445   VLDL 51 (H) 10/16/2017 0411   LDLCALC 91 12/11/2018 1445    Additional studies/ records that were reviewed today include:   Echocardiogram: 07/05/2018 IMPRESSIONS    1. The left ventricle has normal systolic function, with an ejection fraction of 55-60%. The cavity size was normal. Left ventricular diastology could not be evaluated secondary to atrial fibrillation.  2. The right ventricle has normal systolic function. The cavity was normal. There is no increase in right ventricular wall thickness.  3. Right atrial size was mildly dilated.  4. The mitral valve is normal in structure.  5. The tricuspid valve is normal in structure. Tricuspid valve regurgitation is moderate.  6. The aortic valve is normal in structure.  7. Right atrial pressure is estimated at 3 mmHg.  8. The interatrial septum was not assessed.   Assessment:    1. Persistent atrial fibrillation   2. Chronic diastolic heart failure (HCC)   3. Bilateral carotid artery stenosis   4. Essential hypertension   5. Mixed dyslipidemia      Plan:   In order of problems listed above:  1. Persistent Atrial Fibrillation - she underwent DCCV on 01/25/2019 by Dr. Diona BrownerMcDowell with return to NSR following a second synchronized shock at 200 J and initially experienced  improvement in her dyspnea and fatigue for several weeks but symptoms represented this past weekend. EKG today confirms she is back in atrial fibrillation. Given that she had significant improvement in her symptoms when in NSR,  will refer to EP for consideration of antiarrhythmic therapy. Continue Lopressor at current dosing of 25mg  BID for now given HR in the 50's at times when checked at home.  - she denies any evidence of active bleeding. Continued compliance with Eliquis strongly encouraged. .  2. Chronic Diastolic CHF - she has been experiencing worsening dyspnea with an associated weight gain but upon reviewing medications, she self-reduced her Torsemide from BID to daily dosing. Recommended increasing Torsemide back to 40mg  BID. Creatinine stable at 0.93 when checked several weeks ago and she was taking BID dosing at that time. Continued sodium and fluid restriction reviewed.   3. Carotid Artery Stenosis - doppler studies last year showed 1-39% LICA and 40-59% RICA stenosis. Will need repeat dopplers within the next 12 months.  - continue ASA and statin therapy.   4. HTN - BP is elevated at 146/93 during today's visit. She has not yet taken her Losartan today. Recommended she continue to follow readings at home as she now has a BP cuff. Will also increase Torsemide as outlined above. Continue current medication regimen for now.   5. HLD - FLP in 11/2018 showed total choelsterol of 167 and LDL 91. Followed by PCP and she remains on Crestor 40mg  daily.     Medication Adjustments/Labs and Tests Ordered: Current medicines are reviewed at length with the patient today.  Concerns regarding medicines are outlined above.  Medication changes, Labs and Tests ordered today are listed in the Patient Instructions below. Patient Instructions  Medication Instructions: Your physician recommends that you continue on your current medications as directed. Please refer to the Current Medication list given  to you today.   Labwork: None  Procedures/Testing: None  Follow-Up: 3 months Dr.Taylor  We have referred you to Dr.Taylor for A-fib  Any Additional Special Instructions Will Be Listed Below (If Applicable).     If you need a refill on your cardiac medications before your next appointment, please call your pharmacy.     Thank you for choosing [facility] !           Signed, Ellsworth Lennox, PA-C  02/09/2019 11:13 AM    [facility] [address] [place], [place] [address] Phone: [phone] Fax: [phone]

## 2019-02-08 NOTE — Patient Instructions (Signed)
Medication Instructions: Your physician recommends that you continue on your current medications as directed. Please refer to the Current Medication list given to you today.   Labwork: None  Procedures/Testing: None  Follow-Up: 3 months Dr.Taylor  We have referred you to Dr.Taylor for A-fib  Any Additional Special Instructions Will Be Listed Below (If Applicable).     If you need a refill on your cardiac medications before your next appointment, please call your pharmacy.     Thank you for choosing [facility] !

## 2019-02-09 ENCOUNTER — Encounter: Payer: Self-pay | Admitting: Student

## 2019-02-12 ENCOUNTER — Encounter (INDEPENDENT_AMBULATORY_CARE_PROVIDER_SITE_OTHER): Payer: Medicare Other | Admitting: Ophthalmology

## 2019-03-15 ENCOUNTER — Encounter: Payer: Self-pay | Admitting: Internal Medicine

## 2019-03-15 ENCOUNTER — Ambulatory Visit: Payer: Medicare Other | Admitting: Internal Medicine

## 2019-03-15 ENCOUNTER — Other Ambulatory Visit: Payer: Self-pay

## 2019-03-15 VITALS — BP 134/72 | HR 115 | Temp 97.0°F | Ht 59.0 in | Wt 196.0 lb

## 2019-03-15 DIAGNOSIS — Z79899 Other long term (current) drug therapy: Secondary | ICD-10-CM | POA: Diagnosis not present

## 2019-03-15 DIAGNOSIS — I4819 Other persistent atrial fibrillation: Secondary | ICD-10-CM | POA: Diagnosis not present

## 2019-03-15 MED ORDER — FLECAINIDE ACETATE 50 MG PO TABS: 50.0000 mg | ORAL_TABLET | Freq: Two times a day (BID) | ORAL | 3 refills | Status: DC

## 2019-03-15 NOTE — Patient Instructions (Addendum)
Medication Instructions:  Your physician recommends that you continue on your current medications as directed. Please refer to the Current Medication list given to you today.  Start Flecainide 50 mg Two Times Daily   *If you need a refill on your cardiac medications before your next appointment, please call your pharmacy*  Lab Work: Your physician recommends that you return for lab work in: 2 Weeks   If you have labs (blood work) drawn today and your tests are completely normal, you will receive your results only by: Marland Kitchen MyChart Message (if you have MyChart) OR . A paper copy in the mail If you have any lab test that is abnormal or we need to change your treatment, we will call you to review the results.  Testing/Procedures: NONE   Follow-Up: At [facility], you and your health needs are our priority.  As part of our continuing mission to provide you with exceptional heart care, we have created designated Provider Care Teams.  These Care Teams include your primary Cardiologist (physician) and Advanced Practice Providers (APPs -  Physician Assistants and Nurse Practitioners) who all work together to provide you with the care you need, when you need it.  Your next appointment:   3 months  The format for your next appointment:   In Person  Provider:   Cristopher Peru, MD  Other Instructions Thank you for choosing [facility]!

## 2019-03-15 NOTE — Progress Notes (Signed)
HPI Tina [name] is referred today by Dr. Alda PonderKonewaren for evaluation of persistent atrial fib. She is a morbidly obese 68 yo woman with diastolic heart failure and persistent atrial fib. She has undergone DCCV where she remained in rhythm for a couple of days. She felt better. She had ERAF. She has not had syncope. She has minimal palpitations in atrial fib but notes that she has marked fatigue. She has peripheral edema. She does not have a diagnosis of sleep apnea but admits to snoring. Her QT interval and old ECG's have been reviewed and her QT is long.   Allergies  Allergen Reactions  . Cefuroxime Axetil Other (See Comments)    Reaction not recalled by the patient  . Fentanyl And Related Other (See Comments)    Patch: unknown reaction by patient  . Niaspan [Niacin Er] Other (See Comments)    Burning all over  . Sulfa Antibiotics Nausea And Vomiting  . Vasotec [Enalapril] Cough  . Welchol [Colesevelam Hcl] Other (See Comments)    Muscle aches and joint pains  . Lyrica [Pregabalin] Rash    Blistering rash around same time as starting drug  . Penicillins Hives and Rash    Has patient had a PCN reaction causing immediate rash, facial/tongue/throat swelling, SOB or lightheadedness with hypotension: Yes Has patient had a PCN reaction causing severe rash involving mucus membranes or skin necrosis: Yes Has patient had a PCN reaction that required hospitalization: No Has patient had a PCN reaction occurring within the last 10 years: No If all of the above answers are "NO", then may proceed with Cephalosporin use.      Current Outpatient Medications  Medication Sig Dispense Refill  . aspirin EC 81 MG EC tablet Take 1 tablet (81 mg total) by mouth daily. (Patient taking differently: Take 81 mg by mouth at bedtime. ) 30 tablet 4  . cetirizine (ZYRTEC) 10 MG tablet Take 10 mg by mouth daily.    Marland Kitchen. ELIQUIS 5 MG TABS tablet Take 1 tablet by mouth twice daily (Patient taking differently: Take  5 mg by mouth 2 (two) times daily. ) 60 tablet 2  . fenofibrate 54 MG tablet Take 1 tablet by mouth once daily (Patient taking differently: Take 54 mg by mouth daily. ) 90 tablet 0  . HYDROcodone-acetaminophen (NORCO) 5-325 MG tablet Take 1 tablet by mouth 2 (two) times daily as needed for moderate pain. 30 tablet 0  . Insulin Glargine (BASAGLAR KWIKPEN) 100 UNIT/ML SOPN Inject 1.2 mLs (120 Units total) into the skin every morning. 100 mL 1  . losartan (COZAAR) 25 MG tablet Take 1 tablet (25 mg total) by mouth daily. 90 tablet 2  . metoprolol tartrate (LOPRESSOR) 25 MG tablet Take 1 tablet (25 mg total) by mouth 2 (two) times daily. 180 tablet 0  . pantoprazole (PROTONIX) 40 MG tablet Take 1 tablet (40 mg total) by mouth daily. 90 tablet 1  . PARoxetine (PAXIL) 40 MG tablet TAKE 1 TABLET BY MOUTH IN THE MORNING (Patient taking differently: Take 40 mg by mouth daily. TAKE 1 TABLET BY MOUTH IN THE MORNING) 90 tablet 0  . potassium chloride SA (K-DUR) 20 MEQ tablet Take 1 tablet (20 mEq total) by mouth daily. (Patient taking differently: Take 20 mEq by mouth daily as needed. ) 90 tablet 1  . rosuvastatin (CRESTOR) 40 MG tablet Take 1 tablet by mouth once daily 90 tablet 0  . torsemide (DEMADEX) 20 MG tablet Take 2 tablets (40  mg total) by mouth 2 (two) times daily. 120 tablet 6   No current facility-administered medications for this visit.      Past Medical History:  Diagnosis Date  . Atrial fibrillation (HCC)    a. diagnosed in 05/2017 --> started on Eliquis for anticoagulation  . Carotid artery stenosis   . CHF (congestive heart failure) (H[facility])   . Depression   . Diabetes mellitus   . Fibromyalgia   . GERD (gastroesophageal reflux disease)   . Hyperlipidemia   . Hypertension   . Stroke ([facility])   . Superficial thrombophlebitis    right leg    ROS:   All systems reviewed and negative except as noted in the HPI.   Past Surgical History:  Procedure Laterality Date  . CARDIOVERSION N/A  01/25/2019   Procedure: CARDIOVERSION;  Surgeon: Jonelle Sidle, MD;  Location: [facility];  Service: Cardiovascular;  Laterality: N/A;  . CHOLECYSTECTOMY  1991  . HERNIA REPAIR  approx 1969  . NECK SURGERY  1998  . TONSILLECTOMY AND ADENOIDECTOMY  1966     Family History  Problem Relation Age of Onset  . Hypertension Mother   . Hyperlipidemia Mother   . Heart disease Mother   . Stroke Sister   . Hypertension Sister   . Hyperlipidemia Sister   . Heart disease Sister   . Diabetes Maternal Grandmother   . Diabetes Daughter      Social History   Socioeconomic History  . Marital status: Married    Spouse name: Not on file  . Number of children: Not on file  . Years of education: Not on file  . Highest education level: Not on file  Occupational History  . Not on file  Social Needs  . Financial resource strain: Not hard at all  . Food insecurity    Worry: Never true    Inability: Never true  . Transportation needs    Medical: No    Non-medical: No  Tobacco Use  . Smoking status: Former Smoker    Types: Cigarettes  . Smokeless tobacco: Never Used  Substance and Sexual Activity  . Alcohol use: No  . Drug use: No  . Sexual activity: Not Currently  Lifestyle  . Physical activity    Days per week: Not on file    Minutes per session: Not on file  . Stress: Not on file  Relationships  . Social Musician on phone: Not on file    Gets together: Not on file    Attends religious service: Not on file    Active member of club or organization: Not on file    Attends meetings of clubs or organizations: Not on file    Relationship status: Not on file  . Intimate partner violence    Fear of current or ex partner: Not on file    Emotionally abused: Not on file    Physically abused: Not on file    Forced sexual activity: Not on file  Other Topics Concern  . Not on file  Social History Narrative  . Not on file     BP 134/72   Pulse (!) 115   Temp (!) 97 F  (36.1 C)   Ht 4\' 11"  (1.499 m)   Wt 196 lb (88.9 kg)   SpO2 95%   BMI 39.59 kg/m   Physical Exam:  Obese appearing NAD HEENT: Unremarkable Neck:  No JVD, no thyromegally Lymphatics:  No adenopathy Back:  No  CVA tenderness Lungs:   Clear with no wheezes HEART:  Regular rate rhythm, no murmurs, no rubs, no clicks Abd:  soft, positive bowel sounds, no organomegally, no rebound, no guarding Ext:  2 plus pulses, no edema, no cyanosis, no clubbing Skin:  No rashes no nodules Neuro:  CN II through XII intact, motor grossly intact  Assess/Plan: 1. Persistent atrial fib - I have discussed the treatment options with the patient. The combination of obesity and prolongation of the QT interval makes her treatment more difficult and limited. AA drug therapy is really limited to flecainide/propafenone. She will continue her beta blocker and start low dose flecainide. She will undergo slow dose uptitration. If she is able to tolerated 100 bid and has not reverted to NSR then we will plan DCCV. Dofetilide and amio are not good options at this point do to the QT interval.  2. Obesity - I encouraged the patient to lose weight. She probably has sleep apnea and this will need to be evaluated. 3. Diastolic heart failure - her symptoms are class 2. She will continue her current meds.   Mikle Bosworth.D.

## 2019-03-18 IMAGING — CT CT HEAD CODE STROKE
3 series · 15 of 47 positions shown, 18 images · non-contrast
Comparison: CT HEAD July 29, 2014

CLINICAL DATA: Code stroke. Slurred speech and facial droop while
shopping, last seen normal at 16 46 hours. History of hypertension,
hyperlipidemia, diabetes.

EXAM:
CT HEAD WITHOUT CONTRAST
TECHNIQUE: Contiguous axial images were obtained from the base of the skull
through the vertex without intravenous contrast.

[Series 2: head code stroke wo · axial · 0.43mm/px · z∈[-41,+89]mm · 9 of 32 slices shown, 12 images]
[im 3/32  brain]
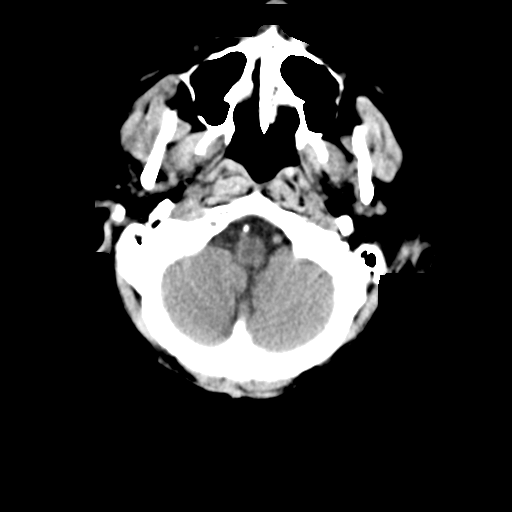
[im 3/32  bone]
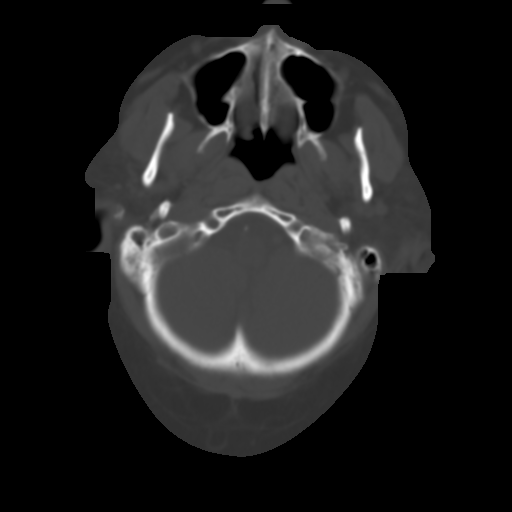
[im 6/32  brain]
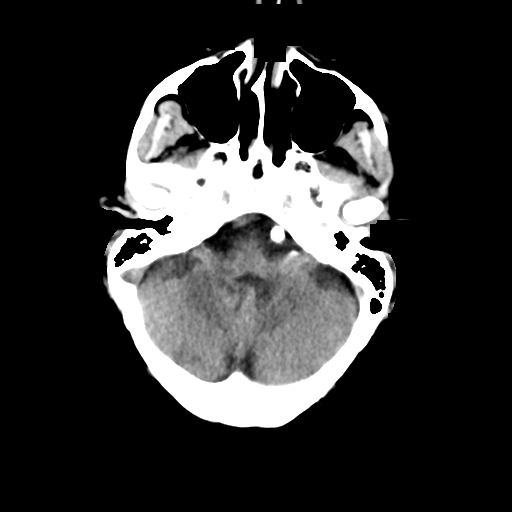
[im 9/32  brain]
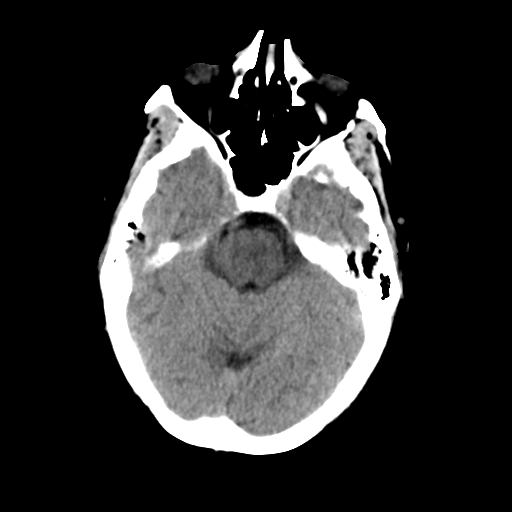
[im 12/32  brain]
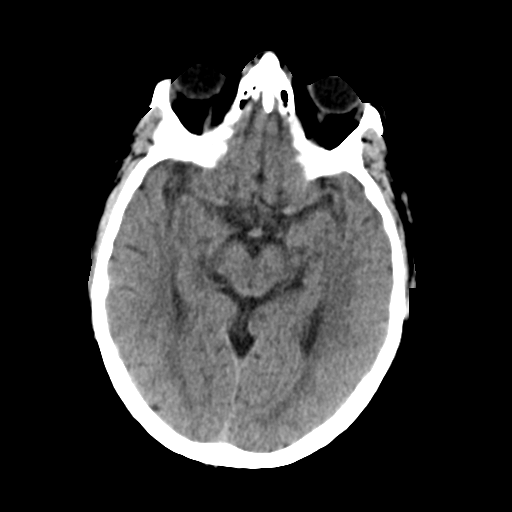
[im 17/32  brain]
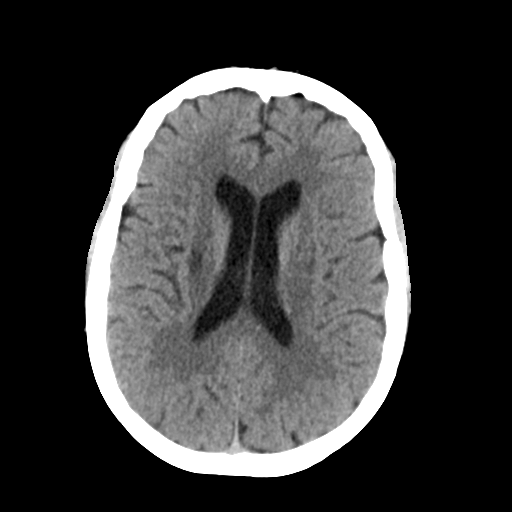
[im 17/32  bone]
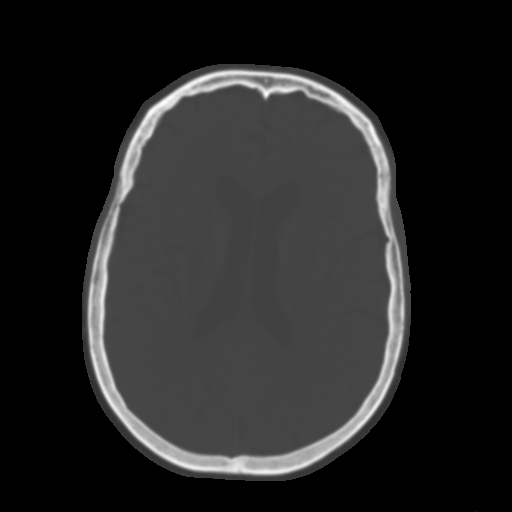
[im 20/32  brain]
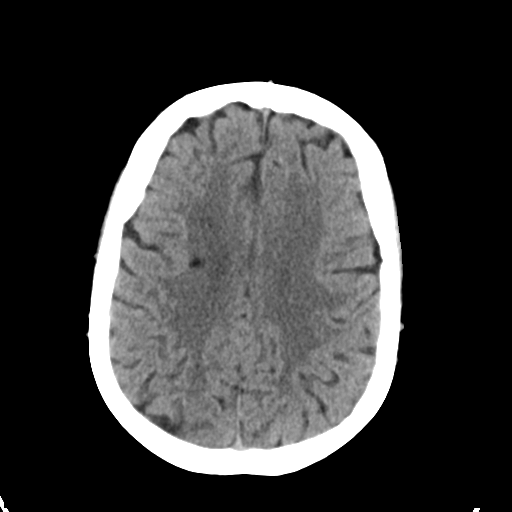
[im 23/32  brain]
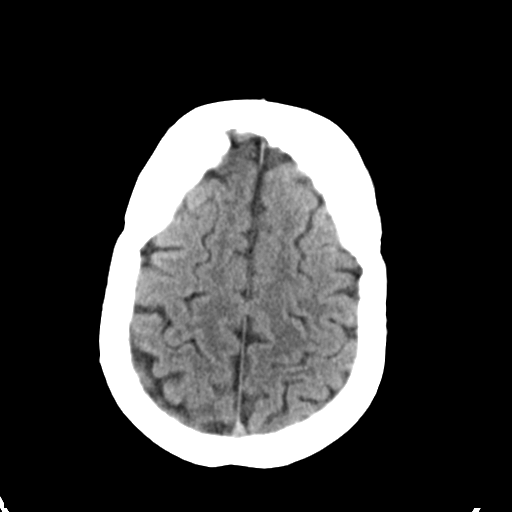
[im 26/32  brain]
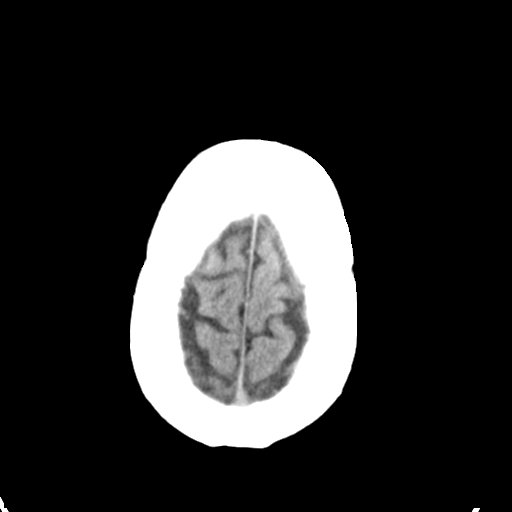
[im 29/32  brain]
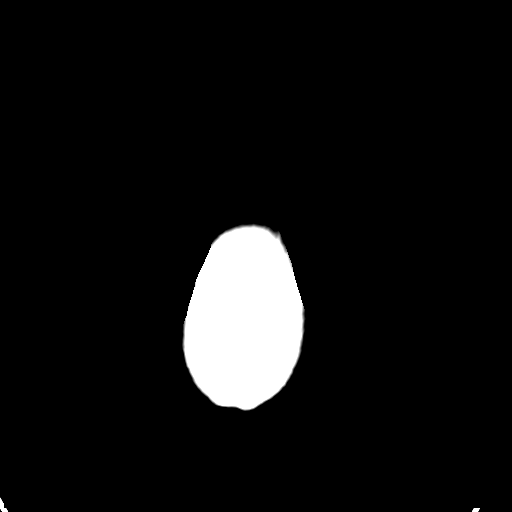
[im 29/32  bone]
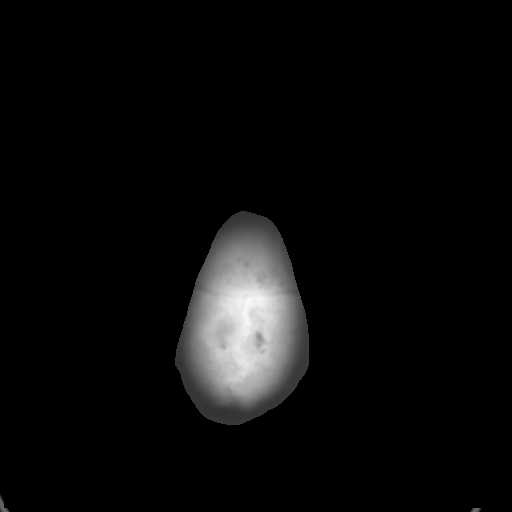

[Series 4: coronal soft tissue · coronal · 0.34mm/px · 3 of 69 slices shown]
[im 23/69  brain]
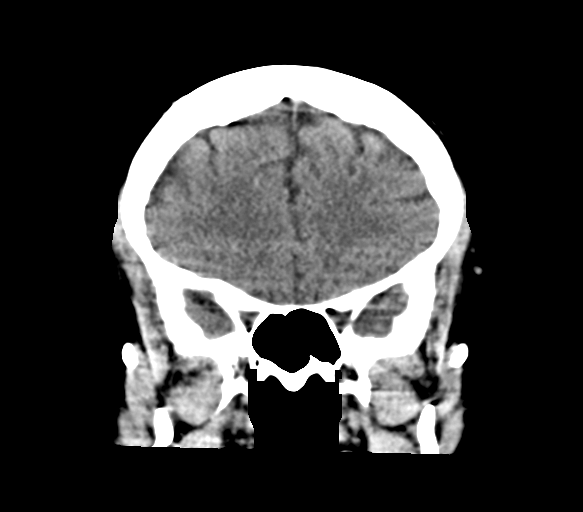
[im 31/69  brain]
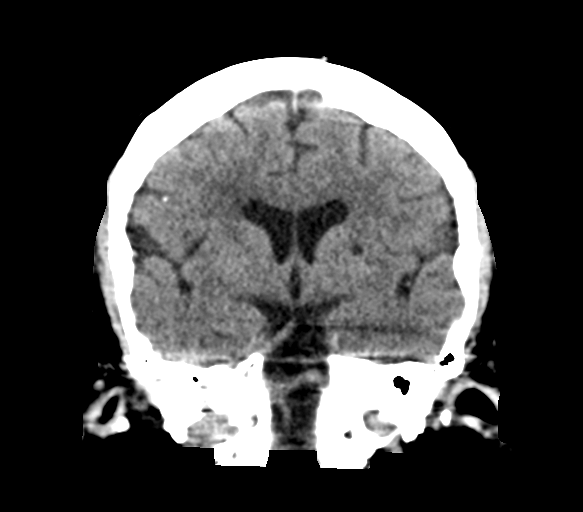
[im 38/69  brain]
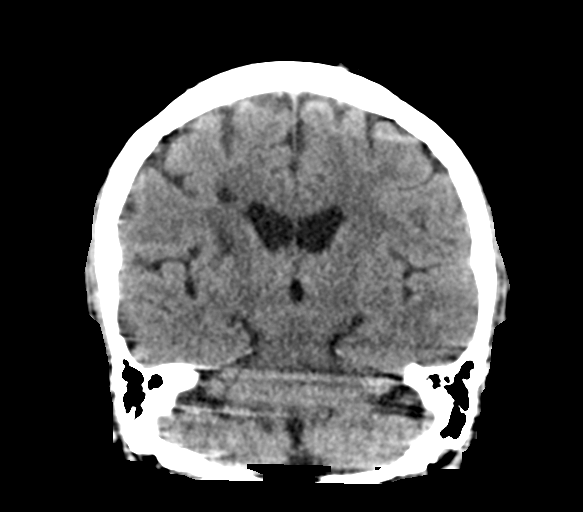

[Series 5: sagittal soft tissue · sagittal · 0.36mm/px · 3 of 67 slices shown]
[im 23/67  brain]
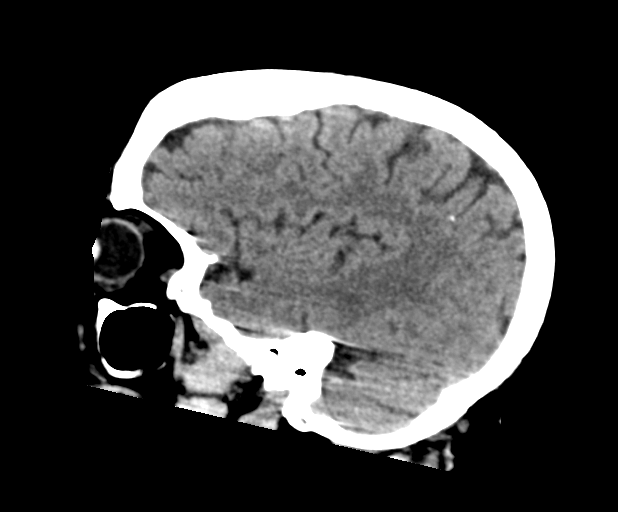
[im 34/67  brain]
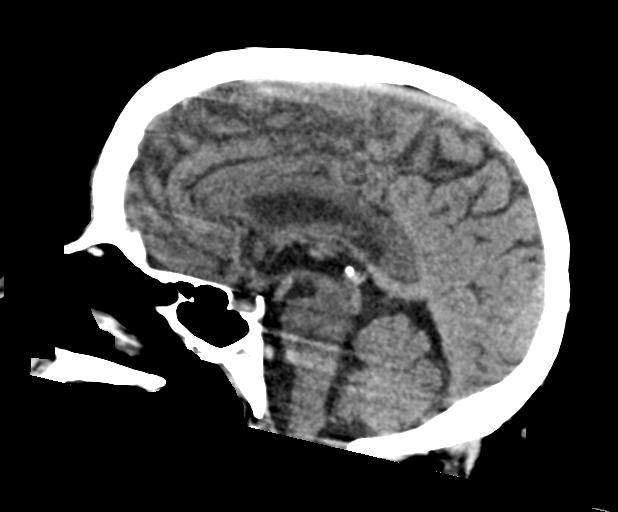
[im 45/67  brain]
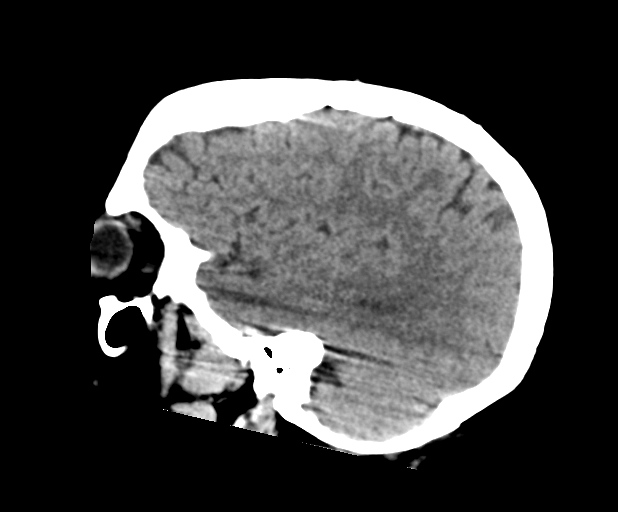

[15 of 47 positions shown; findings below may reference images not displayed]

FINDINGS: BRAIN: No intraparenchymal hemorrhage, mass effect nor midline
shift. The ventricles and sulci are normal for age. Progressive
patchy supratentorial white matter hypodensities. Old bilateral
basal ganglia and RIGHT thalamus lacunar infarcts. Old small RIGHT
cerebellar infarct. No acute large vascular territory infarcts. No
abnormal extra-axial fluid collections. Punctate densities RIGHT
parietal and RIGHT frontal sulci favoring dense veins or
calcifications. Basal cisterns are patent.

VASCULAR: Mild calcific atherosclerosis of the carotid siphons.
Moderate calcific atherosclerosis LEFT vertebral artery. No dense
artery's.

SKULL: No skull fracture. No significant scalp soft tissue swelling.

SINUSES/ORBITS: The mastoid air-cells and included paranasal sinuses
are well-aerated.The included ocular globes and orbital contents are
non-suspicious.

OTHER: None.

ASPECTS (Alberta Stroke Program Early CT Score)

- Ganglionic level infarction (caudate, lentiform nuclei, internal
capsule, insula, M1-M3 cortex): 7

- Supraganglionic infarction (M4-M6 cortex): 3

Total score (0-10 with 10 being normal): 10
IMPRESSION: 1. No acute intracranial process.
2. Multiple old small supra and infratentorial infarcts.
3. Moderate chronic small vessel ischemic disease.
4. Critical Value/emergent results were called by telephone at the
time of interpretation on 03/10/2017 at [DATE] to Dr. BASSI
THRESIAH , who verbally acknowledged these results.
5. ASPECTS is 10.

## 2019-03-22 DIAGNOSIS — E7800 Pure hypercholesterolemia, unspecified: Secondary | ICD-10-CM | POA: Diagnosis not present

## 2019-03-22 DIAGNOSIS — Z794 Long term (current) use of insulin: Secondary | ICD-10-CM | POA: Diagnosis not present

## 2019-03-22 DIAGNOSIS — N390 Urinary tract infection, site not specified: Secondary | ICD-10-CM | POA: Diagnosis not present

## 2019-03-22 DIAGNOSIS — E1169 Type 2 diabetes mellitus with other specified complication: Secondary | ICD-10-CM | POA: Diagnosis not present

## 2019-03-22 DIAGNOSIS — I10 Essential (primary) hypertension: Secondary | ICD-10-CM | POA: Diagnosis not present

## 2019-03-22 DIAGNOSIS — I509 Heart failure, unspecified: Secondary | ICD-10-CM | POA: Diagnosis not present

## 2019-03-22 DIAGNOSIS — Z23 Encounter for immunization: Secondary | ICD-10-CM | POA: Diagnosis not present

## 2019-03-29 ENCOUNTER — Ambulatory Visit (INDEPENDENT_AMBULATORY_CARE_PROVIDER_SITE_OTHER): Payer: Medicare Other

## 2019-03-29 ENCOUNTER — Other Ambulatory Visit: Payer: Self-pay

## 2019-03-29 VITALS — BP 132/72 | HR 93 | Ht 59.0 in | Wt 196.0 lb

## 2019-03-29 DIAGNOSIS — I4819 Other persistent atrial fibrillation: Secondary | ICD-10-CM

## 2019-03-29 NOTE — Progress Notes (Signed)
Pt came in for ekg. She was started on Flecainide 50 mg - two times daily at LOV. She does not have any complaints today.

## 2019-04-11 ENCOUNTER — Telehealth: Payer: Self-pay | Admitting: Cardiovascular Disease

## 2019-04-11 NOTE — Telephone Encounter (Signed)
Patient had an EKG at [facility] office. Called to get results of the EKG..   [phone]

## 2019-04-11 NOTE — Telephone Encounter (Signed)
Will send message to Dr. Lovena Le to review the EKG done on 03/29/2019. Please advise.

## 2019-04-13 ENCOUNTER — Encounter: Payer: Medicare Other | Admitting: Family Medicine

## 2019-04-18 MED ORDER — FLECAINIDE ACETATE 100 MG PO TABS: 100.0000 mg | ORAL_TABLET | Freq: Two times a day (BID) | ORAL | 3 refills | Status: DC

## 2019-04-18 NOTE — Telephone Encounter (Signed)
Sent in RX for increased dose of flecainide to 100 mg BID.

## 2019-04-18 NOTE — Addendum Note (Signed)
Addended by: Debbora Lacrosse R on: 04/18/2019 12:42 PM   Modules accepted: Orders

## 2019-04-18 NOTE — Telephone Encounter (Signed)
Med list states she is on flecainide 50 bid. Please Increase flecainide to 100 bid and recheck an ECG in 2 weeks. GT

## 2019-04-18 NOTE — Telephone Encounter (Signed)
Called pt. Goes straight to voicemail.

## 2019-04-20 ENCOUNTER — Other Ambulatory Visit: Payer: Self-pay | Admitting: Family Medicine

## 2019-05-01 ENCOUNTER — Other Ambulatory Visit: Payer: Self-pay | Admitting: *Deleted

## 2019-05-01 MED ORDER — FENOFIBRATE 54 MG PO TABS: 54.0000 mg | ORAL_TABLET | Freq: Every day | ORAL | 0 refills | Status: DC

## 2019-05-01 MED ORDER — PAROXETINE HCL 40 MG PO TABS: 40.0000 mg | ORAL_TABLET | Freq: Every morning | ORAL | 0 refills | Status: DC

## 2019-05-01 MED ORDER — HYDROCODONE-ACETAMINOPHEN 5-325 MG PO TABS: 1.0000 | ORAL_TABLET | Freq: Two times a day (BID) | ORAL | 0 refills | Status: DC | PRN

## 2019-05-01 NOTE — Telephone Encounter (Signed)
Received call from pharmacy.   Patient has contacted pharmacy requesting refills on Paxil, Tricor, and Hydrocodone/APAP.   Ok to refill Hydrocodone/APAP??  Last office visit 01/19/2019.  Last refill 01/16/2019.

## 2019-05-04 DIAGNOSIS — E1169 Type 2 diabetes mellitus with other specified complication: Secondary | ICD-10-CM | POA: Diagnosis not present

## 2019-05-08 ENCOUNTER — Other Ambulatory Visit: Payer: Self-pay

## 2019-05-08 NOTE — Patient Outreach (Signed)
[facility]) Care Management  05/08/2019  KAYLLA COBOS 1950/07/10 [id number]   Medication Adherence call to Mrs. Madaline Savage Hippa Identifiers Verify spoke with patient she is past due on Rosuvastatin 40 mg,patient explain she takes 1 tablet daily,patient explain she has medication at this time and will order when due. Mrs. Brinker is showing past due under [place].  [facility] Direct Dial [phone]  Fax [phone] [email or web address]

## 2019-05-11 ENCOUNTER — Telehealth (INDEPENDENT_AMBULATORY_CARE_PROVIDER_SITE_OTHER): Payer: Medicare Other | Admitting: Cardiovascular Disease

## 2019-05-11 ENCOUNTER — Encounter: Payer: Self-pay | Admitting: Cardiovascular Disease

## 2019-05-11 VITALS — BP 148/90 | HR 63 | Ht 59.0 in | Wt 188.4 lb

## 2019-05-11 DIAGNOSIS — I6529 Occlusion and stenosis of unspecified carotid artery: Secondary | ICD-10-CM

## 2019-05-11 DIAGNOSIS — Z7901 Long term (current) use of anticoagulants: Secondary | ICD-10-CM

## 2019-05-11 DIAGNOSIS — I6523 Occlusion and stenosis of bilateral carotid arteries: Secondary | ICD-10-CM

## 2019-05-11 DIAGNOSIS — I5032 Chronic diastolic (congestive) heart failure: Secondary | ICD-10-CM

## 2019-05-11 DIAGNOSIS — Z79899 Other long term (current) drug therapy: Secondary | ICD-10-CM

## 2019-05-11 DIAGNOSIS — I110 Hypertensive heart disease with heart failure: Secondary | ICD-10-CM

## 2019-05-11 DIAGNOSIS — E785 Hyperlipidemia, unspecified: Secondary | ICD-10-CM | POA: Diagnosis not present

## 2019-05-11 DIAGNOSIS — I4819 Other persistent atrial fibrillation: Secondary | ICD-10-CM

## 2019-05-11 DIAGNOSIS — I10 Essential (primary) hypertension: Secondary | ICD-10-CM

## 2019-05-11 DIAGNOSIS — Z7982 Long term (current) use of aspirin: Secondary | ICD-10-CM

## 2019-05-11 DIAGNOSIS — Z8673 Personal history of transient ischemic attack (TIA), and cerebral infarction without residual deficits: Secondary | ICD-10-CM

## 2019-05-11 NOTE — Progress Notes (Signed)
Virtual Visit via Telephone Note   This visit type was conducted due to national recommendations for restrictions regarding the COVID-19 Pandemic (e.g. social distancing) in an effort to limit this patient's exposure and mitigate transmission in our community.  Due to her co-morbid illnesses, this patient is at least at moderate risk for complications without adequate follow up.  This format is felt to be most appropriate for this patient at this time.  The patient did not have access to video technology/had technical difficulties with video requiring transitioning to audio format only (telephone).  All issues noted in this document were discussed and addressed.  No physical exam could be performed with this format.  Please refer to the patient's chart for her  consent to telehealth [facility].   Date:  05/11/2019   ID:  Tina [name] Ruz, DOB 11/03/1950, MRN [id number]  Patient Location: Home Provider Location: Office  PCP:  Salley Scarleturham, Kawanta F, MD  Cardiologist:  Prentice DockerSuresh Sha Amer, MD  Electrophysiologist:  Lewayne BuntingGregg Taylor, MD   Evaluation Performed:  Follow-Up Visit  Chief Complaint:  A fib, diastolic CHF  History of Present Illness:    Tina [name] [place] is a 68 y.o. female with chronic diastolic heart failure and persistent atrial fibrillation.  Flecainide recently increased by Dr. Ladona Ridgelaylor to 100 mg twice daily on 04/18/2019.  She has occasional dizziness but denies falls. She has sporadic palpitations.  She denies chest pain.  She complains of leg swelling but won't take torsemide daily.  She monitors sodium intake.   Past Medical History:  Diagnosis Date   Atrial fibrillation (HCC)    a. diagnosed in 05/2017 --> started on Eliquis for anticoagulation   Carotid artery stenosis    CHF (congestive heart failure) (HCC)    Depression    Diabetes mellitus    Fibromyalgia    GERD (gastroesophageal reflux disease)    Hyperlipidemia    Hypertension    Stroke (HCC)     Superficial thrombophlebitis    right leg   Past Surgical History:  Procedure Laterality Date   CARDIOVERSION N/A 01/25/2019   Procedure: CARDIOVERSION;  Surgeon: Jonelle SidleMcDowell, Samuel G, MD;  Location: [facility];  Service: Cardiovascular;  Laterality: N/A;   CHOLECYSTECTOMY  1991   HERNIA REPAIR  approx 1969   NECK SURGERY  1998   TONSILLECTOMY AND ADENOIDECTOMY  1966     Current Meds  Medication Sig   aspirin EC 81 MG EC tablet Take 1 tablet (81 mg total) by mouth daily. (Patient taking differently: Take 81 mg by mouth at bedtime. )   cetirizine (ZYRTEC) 10 MG tablet Take 10 mg by mouth daily.   ELIQUIS 5 MG TABS tablet Take 1 tablet by mouth twice daily (Patient taking differently: Take 5 mg by mouth 2 (two) times daily. )   Empagliflozin-metFORMIN HCl (SYNJARDY) 12.5-500 MG TABS Take 1 tablet by mouth 2 (two) times daily.   fenofibrate 54 MG tablet Take 1 tablet (54 mg total) by mouth daily.   flecainide (TAMBOCOR) 100 MG tablet Take 1 tablet (100 mg total) by mouth 2 (two) times daily.   HYDROcodone-acetaminophen (NORCO) 5-325 MG tablet Take 1 tablet by mouth 2 (two) times daily as needed for moderate pain.   Insulin Glargine (BASAGLAR KWIKPEN [place]) Inject 50 Units into the skin 2 (two) times daily.   losartan (COZAAR) 25 MG tablet Take 1 tablet (25 mg total) by mouth daily.   metoprolol tartrate (LOPRESSOR) 25 MG tablet Take 1 tablet (25 mg total)  by mouth 2 (two) times daily.   pantoprazole (PROTONIX) 40 MG tablet Take 1 tablet (40 mg total) by mouth daily.   PARoxetine (PAXIL) 40 MG tablet Take 1 tablet (40 mg total) by mouth every morning.   potassium chloride (KLOR-CON) 20 MEQ packet Take 20 mEq by mouth as needed (on the days she takes her Torsemide).   rosuvastatin (CRESTOR) 40 MG tablet Take 1 tablet by mouth once daily   torsemide (DEMADEX) 20 MG tablet Take 20-40 mg by mouth as needed (fluid / edema).     Allergies:   Cefuroxime axetil, Fentanyl and  related, Niaspan [niacin er], Sulfa antibiotics, Vasotec [enalapril], Welchol [colesevelam hcl], Lyrica [pregabalin], and Penicillins   Social History   Tobacco Use   Smoking status: Former Smoker    Types: Cigarettes   Smokeless tobacco: Never Used  Substance Use Topics   Alcohol use: No   Drug use: No     Family Hx: The patient's family history includes Diabetes in her daughter and maternal grandmother; Heart disease in her mother and sister; Hyperlipidemia in her mother and sister; Hypertension in her mother and sister; Stroke in her sister.  ROS:   Please see the history of present illness.     All other systems reviewed and are negative.   Prior CV studies:   The following studies were reviewed today:  Echocardiogram: 07/05/2018 IMPRESSIONS   1. The left ventricle has normal systolic function, with an ejection fraction of 55-60%. The cavity size was normal. Left ventricular diastology could not be evaluated secondary to atrial fibrillation. 2. The right ventricle has normal systolic function. The cavity was normal. There is no increase in right ventricular wall thickness. 3. Right atrial size was mildly dilated. 4. The mitral valve is normal in structure. 5. The tricuspid valve is normal in structure. Tricuspid valve regurgitation is moderate. 6. The aortic valve is normal in structure. 7. Right atrial pressure is estimated at 3 mmHg. 8. The interatrial septum was not assessed.  Labs/Other Tests and Data Reviewed:    EKG:  NA  Recent Labs: 07/04/2018: B Natriuretic Peptide 386.9 07/05/2018: Magnesium 1.9 12/11/2018: ALT 18 12/18/2018: Hemoglobin 14.4; Platelets 204 01/22/2019: BUN 23; Creatinine, Ser 0.93; Potassium 3.9; Sodium 135   Recent Lipid Panel Lab Results  Component Value Date/Time   CHOL 167 12/11/2018 02:45 PM   TRIG 273 ([name]) 12/11/2018 02:45 PM   HDL 40 (L) 12/11/2018 02:45 PM   CHOLHDL 4.2 12/11/2018 02:45 PM   LDLCALC 91 12/11/2018  02:45 PM    Wt Readings from Last 3 Encounters:  05/11/19 188 lb 6.4 oz (85.5 kg)  03/29/19 196 lb (88.9 kg)  03/15/19 196 lb (88.9 kg)     Objective:    Vital Signs:  BP (!) 148/90    Pulse 63    Ht 4\' 11"  (1.499 m)    Wt 188 lb 6.4 oz (85.5 kg)    BMI 38.05 kg/m    VITAL SIGNS:  reviewed  ASSESSMENT & PLAN:    1.  Persistent atrial fibrillation: She was evaluated by Dr. Lovena Le most recently on 03/15/2019.  Flecainide recently increased to 100 mg twice daily on 04/18/2019.  Continue Lopressor and apixaban for anticoagulation.  2.  Chronic diastolic heart failure: Continue torsemide at present dosage. She won't take it daily due to increased urinary frequency. She monitors sodium intake.  3.  Carotid artery stenosis: Dopplers in May 2019 showed 1 to 16% LICA and 40 to 10% RICA stenosis.  I will obtain follow-up Dopplers in 2021.  4.  Hypertension: Blood pressure is mildly elevated.  Continue to monitor.  5.  Hyperlipidemia: Continue rosuvastatin 40 mg.  6. History of CVA: She suffered an acute nonhemorrhagic right thalamic punctate infarct in 2019. On ASA and statin.    COVID-19 Education: The signs and symptoms of COVID-19 were discussed with the patient and how to seek care for testing (follow up with PCP or arrange E-visit).  The importance of social distancing was discussed today.  Time:   Today, I have spent 15 minutes with the patient with telehealth technology discussing the above problems.     Medication Adjustments/Labs and Tests Ordered: Current medicines are reviewed at length with the patient today.  Concerns regarding medicines are outlined above.   Tests Ordered: No orders of the defined types were placed in this encounter.   Medication Changes: No orders of the defined types were placed in this encounter.   Follow Up:  Virtual Visit  in 6 month(s)  Signed, Prentice Docker, MD  05/11/2019 12:19 PM    [facility]

## 2019-05-11 NOTE — Patient Instructions (Signed)

## 2019-05-29 ENCOUNTER — Other Ambulatory Visit: Payer: Self-pay

## 2019-05-29 ENCOUNTER — Encounter: Payer: Self-pay | Admitting: Internal Medicine

## 2019-05-29 ENCOUNTER — Ambulatory Visit: Payer: Medicare Other | Admitting: Internal Medicine

## 2019-05-29 VITALS — BP 145/92 | HR 81 | Ht 59.0 in | Wt 197.0 lb

## 2019-05-29 DIAGNOSIS — I4819 Other persistent atrial fibrillation: Secondary | ICD-10-CM | POA: Diagnosis not present

## 2019-05-29 NOTE — Progress Notes (Signed)
HPI Tina [name] returns today for ongoing evaluation of persistent atrial fib. She is a morbidly obese 69 yo woman with diastolic heart failure and persistent atrial fib. She has undergone DCCV where she remained in rhythm for a couple of days. She felt better. She had ERAF. She has not had syncope. She has minimal palpitations in atrial fib but notes that she has marked fatigue and weakness. Her QT interval and old ECG's have been reviewed and her QT is long.  We have started her on the flecainide. She initially felt better but then c/o fatigue. Allergies  Allergen Reactions  . Cefuroxime Axetil Other (See Comments)    Reaction not recalled by the patient  . Fentanyl And Related Other (See Comments)    Patch: unknown reaction by patient  . Niaspan [Niacin Er] Other (See Comments)    Burning all over  . Sulfa Antibiotics Nausea And Vomiting  . Vasotec [Enalapril] Cough  . Welchol [Colesevelam Hcl] Other (See Comments)    Muscle aches and joint pains  . Lyrica [Pregabalin] Rash    Blistering rash around same time as starting drug  . Penicillins Hives and Rash    Has patient had a PCN reaction causing immediate rash, facial/tongue/throat swelling, SOB or lightheadedness with hypotension: Yes Has patient had a PCN reaction causing severe rash involving mucus membranes or skin necrosis: Yes Has patient had a PCN reaction that required hospitalization: No Has patient had a PCN reaction occurring within the last 10 years: No If all of the above answers are "NO", then may proceed with Cephalosporin use.      Current Outpatient Medications  Medication Sig Dispense Refill  . aspirin EC 81 MG EC tablet Take 1 tablet (81 mg total) by mouth daily. (Patient taking differently: Take 81 mg by mouth at bedtime. ) 30 tablet 4  . cetirizine (ZYRTEC) 10 MG tablet Take 10 mg by mouth daily.    Marland Kitchen ELIQUIS 5 MG TABS tablet Take 1 tablet by mouth twice daily (Patient taking differently: Take 5 mg by  mouth 2 (two) times daily. ) 60 tablet 2  . Empagliflozin-metFORMIN HCl (SYNJARDY) 12.5-500 MG TABS Take 1 tablet by mouth 2 (two) times daily.    . fenofibrate 54 MG tablet Take 1 tablet (54 mg total) by mouth daily. 90 tablet 0  . flecainide (TAMBOCOR) 100 MG tablet Take 1 tablet (100 mg total) by mouth 2 (two) times daily. 180 tablet 3  . HYDROcodone-acetaminophen (NORCO) 5-325 MG tablet Take 1 tablet by mouth 2 (two) times daily as needed for moderate pain. 30 tablet 0  . Insulin Glargine (BASAGLAR KWIKPEN [place]) Inject 50 Units into the skin 2 (two) times daily.    Marland Kitchen losartan (COZAAR) 25 MG tablet Take 1 tablet (25 mg total) by mouth daily. 90 tablet 2  . metoprolol tartrate (LOPRESSOR) 25 MG tablet Take 1 tablet (25 mg total) by mouth 2 (two) times daily. 180 tablet 0  . pantoprazole (PROTONIX) 40 MG tablet Take 1 tablet (40 mg total) by mouth daily. 90 tablet 1  . PARoxetine (PAXIL) 40 MG tablet Take 1 tablet (40 mg total) by mouth every morning. 90 tablet 0  . potassium chloride (KLOR-CON) 20 MEQ packet Take 20 mEq by mouth as needed (on the days she takes her Torsemide).    . rosuvastatin (CRESTOR) 40 MG tablet Take 1 tablet by mouth once daily 90 tablet 0  . torsemide (DEMADEX) 20 MG tablet Take 20-40 mg  by mouth as needed (fluid / edema).     No current facility-administered medications for this visit.     Past Medical History:  Diagnosis Date  . Atrial fibrillation (HCC)    a. diagnosed in 05/2017 --> started on Eliquis for anticoagulation  . Carotid artery stenosis   . CHF (congestive heart failure) (HCC)   . Depression   . Diabetes mellitus   . Fibromyalgia   . GERD (gastroesophageal reflux disease)   . Hyperlipidemia   . Hypertension   . Stroke (HCC)   . Superficial thrombophlebitis    right leg    ROS:   All systems reviewed and negative except as noted in the HPI.   Past Surgical History:  Procedure Laterality Date  . CARDIOVERSION N/A 01/25/2019   Procedure:  CARDIOVERSION;  Surgeon: Jonelle Sidle, MD;  Location: [facility];  Service: Cardiovascular;  Laterality: N/A;  . CHOLECYSTECTOMY  1991  . HERNIA REPAIR  approx 1969  . NECK SURGERY  1998  . TONSILLECTOMY AND ADENOIDECTOMY  1966     Family History  Problem Relation Age of Onset  . Hypertension Mother   . Hyperlipidemia Mother   . Heart disease Mother   . Stroke Sister   . Hypertension Sister   . Hyperlipidemia Sister   . Heart disease Sister   . Diabetes Maternal Grandmother   . Diabetes Daughter      Social History   Socioeconomic History  . Marital status: Married    Spouse name: Not on file  . Number of children: Not on file  . Years of education: Not on file  . Highest education level: Not on file  Occupational History  . Not on file  Tobacco Use  . Smoking status: Former Smoker    Types: Cigarettes  . Smokeless tobacco: Never Used  Substance and Sexual Activity  . Alcohol use: No  . Drug use: No  . Sexual activity: Not Currently  Other Topics Concern  . Not on file  Social History Narrative  . Not on file   Social Determinants of Health   Financial Resource Strain:   . Difficulty of Paying Living Expenses: Not on file  Food Insecurity:   . Worried About Programme researcher, broadcasting/film/video in the Last Year: Not on file  . Ran Out of Food in the Last Year: Not on file  Transportation Needs:   . Lack of Transportation (Medical): Not on file  . Lack of Transportation (Non-Medical): Not on file  Physical Activity:   . Days of Exercise per Week: Not on file  . Minutes of Exercise per Session: Not on file  Stress:   . Feeling of Stress : Not on file  Social Connections:   . Frequency of Communication with Friends and Family: Not on file  . Frequency of Social Gatherings with Friends and Family: Not on file  . Attends Religious Services: Not on file  . Active Member of Clubs or Organizations: Not on file  . Attends Banker Meetings: Not on file  . Marital  Status: Not on file  Intimate Partner Violence:   . Fear of Current or Ex-Partner: Not on file  . Emotionally Abused: Not on file  . Physically Abused: Not on file  . Sexually Abused: Not on file     BP (!) 145/92   Pulse 81   Ht 4\' 11"  (1.499 m)   Wt 197 lb (89.4 kg)   SpO2 95%   BMI 39.79  kg/m   Physical Exam:  obese appearing NAD HEENT: Unremarkable Neck:  No JVD, no thyromegally Lymphatics:  No adenopathy Back:  No CVA tenderness Lungs:  Clear with no wheezes HEART:  Regular rate rhythm, no murmurs, no rubs, no clicks Abd:  soft, positive bowel sounds, no organomegally, no rebound, no guarding Ext:  2 plus pulses, no edema, no cyanosis, no clubbing Skin:  No rashes no nodules Neuro:  CN II through XII intact, motor grossly intact  EKG - atrial fib with a controlled VR  Assess/Plan: 1. Atrial fib - we discussed the treatment options and I have recommended she be cardioverted again. If she does not maintain NSR then AV node ablation and left bundle pacing would be recommended. 2. Obesity - she has not lost any weight since I saw her last and she will need to work on this. 3. HTN - her bp is still elevated and will need uptitration of her meds. 4. Coags - I strongly encouraged her not to miss any of her meds.  Mikle Bosworth.D.

## 2019-05-29 NOTE — Patient Instructions (Signed)
Medication Instructions:  Your physician recommends that you continue on your current medications as directed. Please refer to the Current Medication list given to you today.  *If you need a refill on your cardiac medications before your next appointment, please call your pharmacy*  Lab Work: Will be ordered by anesthesia at the pre-op apt  If you have labs (blood work) drawn today and your tests are completely normal, you will receive your results only by: Marland Kitchen MyChart Message (if you have MyChart) OR . A paper copy in the mail If you have any lab test that is abnormal or we need to change your treatment, we will call you to review the results.  Testing/Procedures: Your physician has recommended that you have a Cardioversion (DCCV). Electrical Cardioversion uses a jolt of electricity to your heart either through paddles or wired patches attached to your chest. This is a controlled, usually prescheduled, procedure. Defibrillation is done under light anesthesia in the hospital, and you usually go home the day of the procedure. This is done to get your heart back into a normal rhythm. You are not awake for the procedure. Please see the instruction sheet given to you today.    Follow-Up: At [facility], you and your health needs are our priority.  As part of our continuing mission to provide you with exceptional heart care, we have created designated Provider Care Teams.  These Care Teams include your primary Cardiologist (physician) and Advanced Practice Providers (APPs -  Physician Assistants and Nurse Practitioners) who all work together to provide you with the care you need, when you need it.  Your next appointment:   3 week(s)  The format for your next appointment:   In Person  Provider:   Lewayne Bunting, MD  Other Instructions None

## 2019-06-01 IMAGING — CR DG CHEST 1V PORT
1 series · 1 of 1 positions shown · non-contrast
Comparison: 10/28/2016

CLINICAL DATA: Short of breath for 6 days. CHF. Chest pain with
coughing.

EXAM:
PORTABLE CHEST 1 VIEW

[portable]
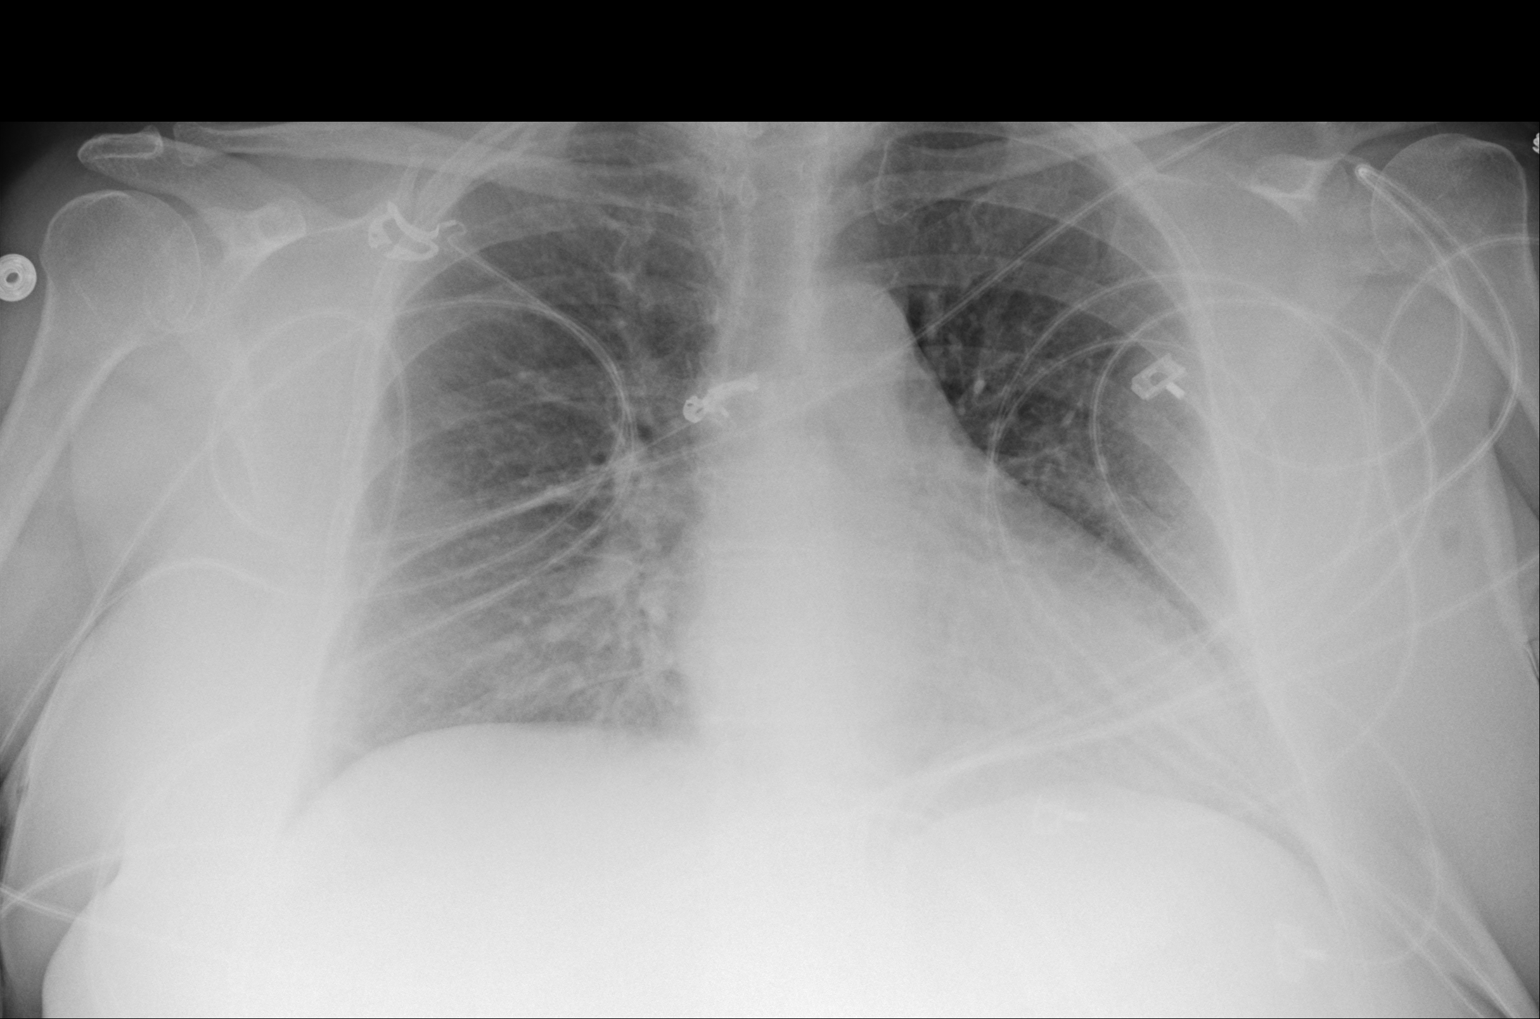

[1 of 1 positions shown; findings below may reference images not displayed]

FINDINGS: Mildly degraded exam due to AP portable technique and patient body
habitus. Lower cervical spine fixation. Midline trachea. Normal
heart size for level of inspiration. No pleural effusion or
pneumothorax. Low lung volumes with resultant pulmonary interstitial
prominence. No lobar consolidation. No congestive failure.
IMPRESSION: Low lung volumes, without acute disease.

## 2019-06-05 ENCOUNTER — Other Ambulatory Visit (HOSPITAL_COMMUNITY): Payer: Medicare Other

## 2019-06-08 ENCOUNTER — Telehealth: Payer: Self-pay | Admitting: Internal Medicine

## 2019-06-08 NOTE — Telephone Encounter (Signed)
Patient missed on day of medication  They were told to contact Dr Ladona Ridgel if she did due to upcoming procedure.

## 2019-06-11 NOTE — Patient Instructions (Signed)
Tina [name]  06/11/2019     @PREFPERIOPPHARMACY @   Your procedure is scheduled on  06/13/2019   Report to [facility] at  1000  A.M.  Call this number if you have problems the morning of surgery:  [phone]   Remember:  Do not eat or drink after midnight.                        Take these medicines the morning of surgery with A SIP OF WATER  Flecanide, hydrocodone(if needed), protonix, paxil. Take 1/2 of your usual insulin dosage the night before your procedure. DO NOT take any medications for diabetes the morning of your pocedure.    Do not wear jewelry, make-up or nail polish.  Do not wear lotions, powders, or perfumes. Please wear deodorant and brush your teeth.  Do not shave 48 hours prior to surgery.  Men may shave face and neck.  Do not bring valuables to the hospital.  [facility] is not responsible for any belongings or valuables.  Contacts, dentures or bridgework may not be worn into surgery.  Leave your suitcase in the car.  After surgery it may be brought to your room.  For patients admitted to the hospital, discharge time will be determined by your treatment team.  Patients discharged the day of surgery will not be allowed to drive home.   Name and phone number of your driver:   family Special instructions:  None  Please read over the following fact sheets that you were given. Anesthesia Post-op Instructions and Care and Recovery After Surgery       Electrical Cardioversion Electrical cardioversion is the delivery of a jolt of electricity to restore a normal rhythm to the heart. A rhythm that is too fast or is not regular keeps the heart from pumping well. In this procedure, sticky patches or metal paddles are placed on the chest to deliver electricity to the heart from a device. This procedure may be done in an emergency if:  There is low or no blood pressure as a result of the heart rhythm.  Normal rhythm must be restored as fast as possible to  protect the brain and heart from further damage.  It may save a life. This may also be a scheduled procedure for irregular or fast heart rhythms that are not immediately life-threatening. Tell a health care provider about:  Any allergies you have.  All medicines you are taking, including vitamins, herbs, eye drops, creams, and over-the-counter medicines.  Any problems you or family members have had with anesthetic medicines.  Any blood disorders you have.  Any surgeries you have had.  Any medical conditions you have.  Whether you are pregnant or may be pregnant. What are the risks? Generally, this is a safe procedure. However, problems may occur, including:  Allergic reactions to medicines.  A blood clot that breaks free and travels to other parts of your body.  The possible return of an abnormal heart rhythm within hours or days after the procedure.  Your heart stopping (cardiac arrest). This is rare. What happens before the procedure? Medicines  Your health care provider may have you start taking: ? Blood-thinning medicines (anticoagulants) so your blood does not clot as easily. ? Medicines to help stabilize your heart rate and rhythm.  Ask your health care provider about: ? Changing or stopping your regular medicines. This is especially important if you are taking diabetes  medicines or blood thinners. ? Taking medicines such as aspirin and ibuprofen. These medicines can thin your blood. Do not take these medicines unless your health care provider tells you to take them. ? Taking over-the-counter medicines, vitamins, herbs, and supplements. General instructions  Follow instructions from your health care provider about eating or drinking restrictions.  Plan to have someone take you home from the hospital or clinic.  If you will be going home right after the procedure, plan to have someone with you for 24 hours.  Ask your health care provider what steps will be taken  to help prevent infection. These may include washing your skin with a germ-killing soap. What happens during the procedure?   An IV will be inserted into one of your veins.  Sticky patches (electrodes) or metal paddles may be placed on your chest.  You will be given a medicine to help you relax (sedative).  An electrical shock will be delivered. The procedure may vary among health care providers and hospitals. What can I expect after the procedure?  Your blood pressure, heart rate, breathing rate, and blood oxygen level will be monitored until you leave the hospital or clinic.  Your heart rhythm will be watched to make sure it does not change.  You may have some redness on the skin where the shocks were given. Follow these instructions at home:  Do not drive for 24 hours if you were given a sedative during your procedure.  Take over-the-counter and prescription medicines only as told by your health care provider.  Ask your health care provider how to check your pulse. Check it often.  Rest for 48 hours after the procedure or as told by your health care provider.  Avoid or limit your caffeine use as told by your health care provider.  Keep all follow-up visits as told by your health care provider. This is important. Contact a health care provider if:  You feel like your heart is beating too quickly or your pulse is not regular.  You have a serious muscle cramp that does not go away. Get help right away if:  You have discomfort in your chest.  You are dizzy or you feel faint.  You have trouble breathing or you are short of breath.  Your speech is slurred.  You have trouble moving an arm or leg on one side of your body.  Your fingers or toes turn cold or blue. Summary  Electrical cardioversion is the delivery of a jolt of electricity to restore a normal rhythm to the heart.  This procedure may be done right away in an emergency or may be a scheduled procedure if the  condition is not an emergency.  Generally, this is a safe procedure.  After the procedure, check your pulse often as told by your health care provider. This information is not intended to replace advice given to you by your health care provider. Make sure you discuss any questions you have with your health care provider. Document Revised: 12/11/2018 Document Reviewed: 12/11/2018 Elsevier Patient Education  2020 Elsevier Inc. Monitored Anesthesia Care, Care After These instructions provide you with information about caring for yourself after your procedure. Your health care provider may also give you more specific instructions. Your treatment has been planned according to current medical practices, but problems sometimes occur. Call your health care provider if you have any problems or questions after your procedure. What can I expect after the procedure? After your procedure, you may:  Feel  sleepy for several hours.  Feel clumsy and have poor balance for several hours.  Feel forgetful about what happened after the procedure.  Have poor judgment for several hours.  Feel nauseous or vomit.  Have a sore throat if you had a breathing tube during the procedure. Follow these instructions at home: For at least 24 hours after the procedure:      Have a responsible adult stay with you. It is important to have someone help care for you until you are awake and alert.  Rest as needed.  Do not: ? Participate in activities in which you could fall or become injured. ? Drive. ? Use heavy machinery. ? Drink alcohol. ? Take sleeping pills or medicines that cause drowsiness. ? Make important decisions or sign legal documents. ? Take care of children on your own. Eating and drinking  Follow the diet that is recommended by your health care provider.  If you vomit, drink water, juice, or soup when you can drink without vomiting.  Make sure you have little or no nausea before eating solid  foods. General instructions  Take over-the-counter and prescription medicines only as told by your health care provider.  If you have sleep apnea, surgery and certain medicines can increase your risk for breathing problems. Follow instructions from your health care provider about wearing your sleep device: ? Anytime you are sleeping, including during daytime naps. ? While taking prescription pain medicines, sleeping medicines, or medicines that make you drowsy.  If you smoke, do not smoke without supervision.  Keep all follow-up visits as told by your health care provider. This is important. Contact a health care provider if:  You keep feeling nauseous or you keep vomiting.  You feel light-headed.  You develop a rash.  You have a fever. Get help right away if:  You have trouble breathing. Summary  For several hours after your procedure, you may feel sleepy and have poor judgment.  Have a responsible adult stay with you for at least 24 hours or until you are awake and alert. This information is not intended to replace advice given to you by your health care provider. Make sure you discuss any questions you have with your health care provider. Document Revised: 08/08/2017 Document Reviewed: 08/31/2015 Elsevier Patient Education  2020 [employer].

## 2019-06-12 ENCOUNTER — Encounter (HOSPITAL_COMMUNITY): Payer: Self-pay | Admitting: Anesthesiology

## 2019-06-12 ENCOUNTER — Encounter (HOSPITAL_COMMUNITY)
Admission: RE | Admit: 2019-06-12 | Discharge: 2019-06-12 | Disposition: A | Discharge status: Home / Self Care | Payer: Medicare Other | Source: Ambulatory Visit | Attending: Cardiovascular Disease | Admitting: Cardiovascular Disease

## 2019-06-12 ENCOUNTER — Other Ambulatory Visit (HOSPITAL_COMMUNITY)
Admission: RE | Admit: 2019-06-12 | Discharge: 2019-06-12 | Disposition: A | Discharge status: Home / Self Care | Payer: Medicare Other | Source: Ambulatory Visit | Attending: Cardiovascular Disease | Admitting: Cardiovascular Disease

## 2019-06-12 ENCOUNTER — Encounter (HOSPITAL_COMMUNITY): Payer: Self-pay

## 2019-06-12 ENCOUNTER — Other Ambulatory Visit: Payer: Self-pay

## 2019-06-12 NOTE — Pre-Procedure Instructions (Signed)
Patient was a no show for her PAT. Spoke with Dahlia Client in cardiology and she states patient is cancelled due to missing 2 doses of her eliquis. Scheduler notified.

## 2019-06-18 ENCOUNTER — Telehealth: Payer: Self-pay | Admitting: Cardiovascular Disease

## 2019-06-18 MED ORDER — METOPROLOL TARTRATE 25 MG PO TABS: 25.0000 mg | ORAL_TABLET | Freq: Two times a day (BID) | ORAL | 0 refills | Status: DC

## 2019-06-18 NOTE — Telephone Encounter (Signed)
Needing refill on metoprolol tartrate (LOPRESSOR) 25 MG tablet [[id number]]  Sent to [facility]

## 2019-06-18 NOTE — Telephone Encounter (Signed)
Done

## 2019-07-03 ENCOUNTER — Encounter: Payer: Medicare Other | Admitting: Family Medicine

## 2019-07-03 NOTE — Progress Notes (Deleted)
Subjective:   Patient presents for Medicare Annual/Subsequent preventive examination.   Patient here for annual wellness exam.  Diabetes mellitus she is followed by endocrinology Dr. Talmage Nap.  Her last A1c was.  The last one I have on file was back in August it was 11.1%.  He is now on Synjardy as well as Lantus   Followed by cardiology secondary to her paroxysmal atrial fibrillation.  She had cardioversion done but that persistent atrial fibrillation diastolic CHF.  He is scheduled for other cardioversion on  February 18.  She has furosemide 40 mg daily as needed as well as flecainide, Eliqus and ASA 81mg ,  for blood pressure control metoprolol and losartan    Hypertriglyceridemia her last triglycerides were 273 back in July 2020, LDL 91 she is on fenofibrate as well as Max dose of Crestor 40 mg  Chronic pain she has hydrocodone 45 tablets monthly in addition to her gabapentin  .  Major depression she is still taking Paxil 40 mg once a day  GERD she is on pantoprazole 40 mg once a day Review Past Medical/Family/Social:per EMR   Risk Factors  Current exercise habits: little exercise Dietary issues discussed: Yes  Cardiac risk factors: Obesity (BMI >= 30 kg/m2). DM, HTN, CVA, CHF  Depression Screen  (Note: if answer to either of the following is "Yes", a more complete depression screening is indicated)  Over the past two weeks, have you felt down, depressed or hopeless? No Over the past two weeks, have you felt little interest or pleasure in doing things? No Have you lost interest or pleasure in daily life? No Do you often feel hopeless? No Do you cry easily over simple problems? No   Activities of Daily Living  In your present state of health, do you have any difficulty performing the following activities?:  Driving? No  Managing money? No  Feeding yourself? No  Getting from bed to chair? No  Climbing a flight of stairs?yes Preparing food and eating?: No  Bathing  or showering? No  Getting dressed: No  Getting to the toilet? No  Using the toilet:No  Moving around from place to place: No  In the past year have you fallen or had a near fall?:yes Are you sexually active? No  Do you have more than one partner? No   Hearing Difficulties:  Do you often ask people to speak up or repeat themselves? No  Do you experience ringing or noises in your ears? yes Do you have difficulty understanding soft or whispered voices? No  Do you feel that you have a problem with memory? yes Do you often misplace items? yes  Do you feel safe at home? Yes  Cognitive Testing  Alert? Yes Normal Appearance?Yes  Oriented to person? Yes Place? Yes  Time? Yes  Recall of three objects? Yes  Can perform simple calculations? Yes  Displays appropriate judgment?Yes  Can read the correct time from a watch face?Yes   List the Names of Other Physician/Practitioners you currently use:  Endocrinology Cardiology Vascular Neurology   Screening Tests / Date Colonoscopy    Overdue                 Zostavax Due Pneumonia- Due for pneumonia vax 23  Mammogram Due Bone Density Due  Tetanus/tdap DUE  ROS: GEN- denies fatigue, fever, weight loss,weakness, recent illness HEENT- denies eye drainage, change in vision, nasal discharge, CVS- denies chest pain, palpitations RESP- denies SOB, cough, wheeze ABD- denies N/V, change in stools,  abd pain GU- denies dysuria, hematuria, dribbling, incontinence MSK- denies joint pain, muscle aches, injury Neuro- denies headache, dizziness, syncope, seizure activity  PHYSICAL: Vitals reviewed GEN- NAD, alert and oriented x3 HEENT- PERRL, EOMI, non injected sclera, pink conjunctiva, MMM, oropharynx clear Neck- Supple, no thryomegaly Breast- normal symmetry, no nipple inversion,no nipple drainage, no nodules or lumps felt Nodes- no axillary nodes CVS- irregulary rhythem,normal rate  no murmur RESP-CTAB ABD-NABS GU- normal external  genitalia, vaginal mucosa pink and moist, cervix visualized no growth, no blood form os, + discharge, urine released, no CMT, no ovarian masses, uterus normal size Rectum- external skin tag, normal tone, no stool in vault, FOBT neg  EXT- No edema Pulses- Radial, DP- 2+    Assessment:    Annual wellness medicare exam   Plan:    During the course of the visit the patient was educated and counseled about appropriate screening and preventive services including:  Screening mammography /Bone Density- pt to set up Colorectal cancer screening - pt declines   Shingles vaccine/TDAP. Prescription given to that she can get the vaccine at the pharmacy or Medicare part D.    DM- per endocrinology   A fib- cardioversion scheduled by cardiology, continue eliquis   HTN-   Continue f/u with other specialist   Chronic pain- refilled norco    PHQ Score 9- she is on paxil for anxiety/depression, no changes   Advanced directive   Diet review for nutrition referral? Yes ____ Not Indicated __x__  Patient Instructions (the written plan) was given to the patient.  Medicare Attestation  I have personally reviewed:  The patient's medical and social history  Their use of alcohol, tobacco or illicit drugs  Their current medications and supplements  The patient's functional ability including ADLs,fall risks, home safety risks, cognitive, and hearing and visual impairment  Diet and physical activities  Evidence for depression or mood disorders  The patient's weight, height, BMI, and visual acuity have been recorded in the chart. I have made referrals, counseling, and provided education to the patient based on review of the above and I have provided the patient with a written personalized care plan for preventive services.

## 2019-07-05 ENCOUNTER — Other Ambulatory Visit: Payer: Self-pay

## 2019-07-05 ENCOUNTER — Other Ambulatory Visit: Payer: Self-pay | Admitting: Family Medicine

## 2019-07-05 NOTE — Patient Outreach (Signed)
[facility]) Care Management  07/05/2019  Tina [name] 05-08-1951 [id number]   Medication Adherence call to Mrs. Tina [name] Hippa Identifiers Verify spoke with patient she is due on Rosuvastatin 40 mg ,patient explain she takes 1 tablet daily,patient ask if we can call [facility] to order this medication and Elequis,pharmacy will fill and deliver for patient. [name] is showing past due under [facility] Ins.   Lillia Abed CPhT Pharmacy Technician [facility] Care Management Direct Dial [phone]  Fax [phone] [email or web address]

## 2019-07-09 ENCOUNTER — Other Ambulatory Visit (HOSPITAL_COMMUNITY): Payer: Self-pay

## 2019-07-09 ENCOUNTER — Other Ambulatory Visit: Payer: Self-pay | Admitting: Cardiology

## 2019-07-09 ENCOUNTER — Other Ambulatory Visit (HOSPITAL_COMMUNITY): Payer: Self-pay | Admitting: *Deleted

## 2019-07-10 ENCOUNTER — Telehealth: Payer: Self-pay

## 2019-07-10 ENCOUNTER — Other Ambulatory Visit: Payer: Self-pay

## 2019-07-10 ENCOUNTER — Other Ambulatory Visit (HOSPITAL_COMMUNITY)
Admission: RE | Admit: 2019-07-10 | Discharge: 2019-07-10 | Disposition: A | Discharge status: Home / Self Care | Payer: Medicare Other | Source: Ambulatory Visit | Attending: Cardiology | Admitting: Cardiology

## 2019-07-10 ENCOUNTER — Encounter (HOSPITAL_COMMUNITY): Admission: RE | Admit: 2019-07-10 | Payer: Medicare Other | Source: Ambulatory Visit

## 2019-07-10 NOTE — Telephone Encounter (Signed)
Called Pt's God daughter Tina [name]) to inform her Catherene missed her PAT. She stated she was unable to cancel that, but the patient has missed another dose of eliquis on February 1st or 2nd and will need to have DCCV rescheduled. I will inform Dr. Ladona Ridgel.

## 2019-07-12 ENCOUNTER — Ambulatory Visit (HOSPITAL_COMMUNITY): Admission: RE | Admit: 2019-07-12 | Payer: Medicare Other | Source: Ambulatory Visit | Admitting: Cardiology

## 2019-07-12 ENCOUNTER — Encounter (HOSPITAL_COMMUNITY): Admission: RE | Payer: Self-pay | Source: Ambulatory Visit

## 2019-07-12 SURGERY — CARDIOVERSION
Anesthesia: Monitor Anesthesia Care

## 2019-07-13 ENCOUNTER — Other Ambulatory Visit: Payer: Self-pay | Admitting: *Deleted

## 2019-07-13 MED ORDER — FLUCONAZOLE 150 MG PO TABS: 150.0000 mg | ORAL_TABLET | Freq: Once | ORAL | 0 refills | Status: AC

## 2019-07-18 ENCOUNTER — Ambulatory Visit: Payer: Medicare Other | Admitting: Internal Medicine

## 2019-07-30 ENCOUNTER — Other Ambulatory Visit: Payer: Self-pay | Admitting: *Deleted

## 2019-07-30 MED ORDER — HYDROCODONE-ACETAMINOPHEN 5-325 MG PO TABS: 1.0000 | ORAL_TABLET | Freq: Two times a day (BID) | ORAL | 0 refills | Status: DC | PRN

## 2019-07-30 NOTE — Telephone Encounter (Signed)
Received call from patient.   Requested refill on Hydrocodone/APAP.  Ok to refill??  Last office visit 01/19/2019.  Last refill 05/01/2019.

## 2019-07-31 ENCOUNTER — Telehealth: Payer: Self-pay | Admitting: *Deleted

## 2019-07-31 NOTE — Telephone Encounter (Signed)
Call placed to patient daughter Kim. LMTRC.  

## 2019-07-31 NOTE — Telephone Encounter (Signed)
-----   Message from Birdie Hopes Bullins sent at 07/31/2019  9:38 AM EST ----- Regarding: RE: Pt needs OV scheduled Tried to call she has a robo killer, and no way to leave message :( ----- Message ----- From: Phillips Odor, LPN Sent: 11/26/8113   8:02 AM EST To: Albertine Patricia Bullins Subject: FW: Pt needs OV scheduled                       ----- Message ----- From: Salley Scarlet, MD Sent: 07/30/2019   8:26 PM EST To: Durwin Nora Everest Hacking, LPN Subject: Pt needs OV scheduled

## 2019-08-01 NOTE — Telephone Encounter (Signed)
Call placed to patient daughter [name]. LMTRC.

## 2019-08-02 NOTE — Telephone Encounter (Signed)
Multiple calls placed to patient with no answer and no return call.   Message to be closed.   Letter sent.   

## 2019-08-03 NOTE — Telephone Encounter (Signed)
Pt has been rescheduled for 4/20 at 12:45pm

## 2019-08-08 ENCOUNTER — Other Ambulatory Visit: Payer: Self-pay

## 2019-08-08 ENCOUNTER — Ambulatory Visit: Payer: Medicare Other | Admitting: Family Medicine

## 2019-08-08 NOTE — Patient Outreach (Signed)
[facility]) Care Management  08/08/2019  JANAVIA ROTTMAN [birth year] [id number]   Medication Adherence call to Mrs. Chaney Malling Telephone call to Patient regarding Medication Adherence unable to reach patient,patient voice mail is not set up to leave messages. Mrs. Herdt is showing past due on Rosuvastatin 40 mg under [employer] Ins.   Lillia Abed CPhT Pharmacy Technician [facility] Care Management Direct Dial [phone]  Fax [phone] [email or web address]

## 2019-08-09 ENCOUNTER — Other Ambulatory Visit: Payer: Self-pay | Admitting: Family Medicine

## 2019-08-15 ENCOUNTER — Ambulatory Visit (INDEPENDENT_AMBULATORY_CARE_PROVIDER_SITE_OTHER): Payer: Medicare Other | Admitting: Family Medicine

## 2019-08-15 ENCOUNTER — Encounter: Payer: Self-pay | Admitting: Family Medicine

## 2019-08-15 ENCOUNTER — Other Ambulatory Visit: Payer: Self-pay

## 2019-08-15 VITALS — BP 140/88 | HR 92 | Temp 98.7°F | Resp 14 | Ht 59.0 in | Wt 197.0 lb

## 2019-08-15 DIAGNOSIS — I6930 Unspecified sequelae of cerebral infarction: Secondary | ICD-10-CM

## 2019-08-15 DIAGNOSIS — Z23 Encounter for immunization: Secondary | ICD-10-CM | POA: Diagnosis not present

## 2019-08-15 DIAGNOSIS — L84 Corns and callosities: Secondary | ICD-10-CM

## 2019-08-15 DIAGNOSIS — I10 Essential (primary) hypertension: Secondary | ICD-10-CM | POA: Diagnosis not present

## 2019-08-15 DIAGNOSIS — Z6839 Body mass index (BMI) 39.0-39.9, adult: Secondary | ICD-10-CM

## 2019-08-15 DIAGNOSIS — E11649 Type 2 diabetes mellitus with hypoglycemia without coma: Secondary | ICD-10-CM

## 2019-08-15 DIAGNOSIS — I480 Paroxysmal atrial fibrillation: Secondary | ICD-10-CM

## 2019-08-15 DIAGNOSIS — I5032 Chronic diastolic (congestive) heart failure: Secondary | ICD-10-CM

## 2019-08-15 DIAGNOSIS — E7800 Pure hypercholesterolemia, unspecified: Secondary | ICD-10-CM

## 2019-08-15 DIAGNOSIS — M797 Fibromyalgia: Secondary | ICD-10-CM

## 2019-08-15 DIAGNOSIS — M21619 Bunion of unspecified foot: Secondary | ICD-10-CM

## 2019-08-15 LAB — LIPID PANEL
Cholesterol: 174 mg/dL (ref <200)
HDL: 51 mg/dL (ref >=50)
LDL Cholesterol (Calc): 86 mg/dL (calc)
Non-HDL Cholesterol (Calc): 123 mg/dL (calc) (ref <130)
Total CHOL/HDL Ratio: 3.4 (calc) (ref <5.0)
Triglycerides: 281 mg/dL — ABNORMAL HIGH (ref <150)

## 2019-08-15 LAB — COMPREHENSIVE METABOLIC PANEL
AG Ratio: 1.3 (calc) (ref 1.0–2.5)
ALT: 22 U/L (ref 6–29)
AST: 16 U/L (ref 10–35)
Albumin: 4.3 g/dL (ref 3.6–5.1)
Alkaline phosphatase (APISO): 74 U/L (ref 37–153)
BUN/Creatinine Ratio: 13 (calc) (ref 6–22)
BUN: 14 mg/dL (ref 7–25)
CO2: 28 mmol/L (ref 20–32)
Calcium: 9.2 mg/dL (ref 8.6–10.4)
Chloride: 102 mmol/L (ref 98–110)
Creat: 1.04 mg/dL — ABNORMAL HIGH (ref 0.50–0.99)
Globulin: 3.2 g/dL (calc) (ref 1.9–3.7)
Glucose, Bld: 303 mg/dL — ABNORMAL HIGH (ref 65–99)
Potassium: 3.8 mmol/L (ref 3.5–5.3)
Sodium: 140 mmol/L (ref 135–146)
Total Bilirubin: 0.6 mg/dL (ref 0.2–1.2)
Total Protein: 7.5 g/dL (ref 6.1–8.1)

## 2019-08-15 LAB — CBC WITH DIFFERENTIAL/PLATELET
Absolute Monocytes: 614 cells/uL (ref 200–950)
Basophils Absolute: 33 cells/uL (ref 0–200)
Basophils Relative: 0.5 %
Eosinophils Absolute: 112 cells/uL (ref 15–500)
Eosinophils Relative: 1.7 %
HCT: 42.6 % (ref 35.0–45.0)
Hemoglobin: 14.2 g/dL (ref 11.7–15.5)
Lymphs Abs: 1762 cells/uL (ref 850–3900)
MCH: 32.7 pg (ref 27.0–33.0)
MCHC: 33.3 g/dL (ref 32.0–36.0)
MCV: 98.2 fL (ref 80.0–100.0)
MPV: 12.2 fL (ref 7.5–12.5)
Monocytes Relative: 9.3 %
Neutro Abs: 4079 cells/uL (ref 1500–7800)
Neutrophils Relative %: 61.8 %
Platelets: 191 10*3/uL (ref 140–400)
RBC: 4.34 10*6/uL (ref 3.80–5.10)
RDW: 13.5 % (ref 11.0–15.0)
Total Lymphocyte: 26.7 %
WBC: 6.6 10*3/uL (ref 3.8–10.8)

## 2019-08-15 NOTE — Assessment & Plan Note (Signed)
Fibromyalgia along with some osteoarthritis.  Continue as needed hydrocodone which she does not take daily.

## 2019-08-15 NOTE — Assessment & Plan Note (Signed)
Blood pressure is still mildly elevated.  However she is getting cardioversion in a couple weeks I would not change her medication and defer to cardiology.

## 2019-08-15 NOTE — Patient Instructions (Addendum)
Referral to foot doctor We will call with lab results  Pneumonia vaccine given F/U 4 months for WELLNESS EXAM   Schedule your COVID vaccine  [email or web address]  [place] or [facility]  [email or web address]  Or [email or web address]

## 2019-08-15 NOTE — Assessment & Plan Note (Signed)
She is not symptomatic.  She does have some pedal edema today.  She is not taking her water pill daily

## 2019-08-15 NOTE — Progress Notes (Signed)
Subjective:    Patient ID: Tina [name], female    DOB: 1950-12-07, 69 y.o.   MRN: [id number]  Patient presents for Follow-up (is not fasting)   PT here to f/u chronic medical problems, dauhter [name] with her today    She missed her last appt    A FIB/CAD - on eliquis, flecainide   BP at home,  Taking losartan 25mg , lopressor 25mg  BID    Crestor   scheduled for cardioversion in April for A fib They are considering a pacemaker She has demadex on hand, takes as needed. Denies any swelling in her feet   DM- followed byDr. Chalmers Cater  , on Syngardy (combo) and Lantus 50 units BID     Has appt end of April   CBG 160-170's fasting , checks CBG 2-3 times a day, she had a few low sugars down into 80's she will be symptomatic at that level  States she had A1C in December   GERD taking PPI as prescribed continues to benefit from this.  GAD/MDD she has some good days and bad days.  The Paxil does help.  Now that she is getting outside getting to go to the grocery store and out shopping she is much happier.  Chronic pain- orn norco using, topical spray right knee, this gives the most trouble   She is walking more, doest require her walker, has cane on hand . No falls     [facility] April 18th has appt    Mammo in May  Note she was sent for sleep study she does not want to pursue this at this time Review Of Systems:  GEN- denies fatigue, fever, weight loss,weakness, recent illness HEENT- denies eye drainage, change in vision, nasal discharge, CVS- denies chest pain, palpitations RESP- denies SOB, cough, wheeze ABD- denies N/V, change in stools, abd pain GU- denies dysuria, hematuria, dribbling, incontinence MSK- denies joint pain, muscle aches, injury Neuro- denies headache, dizziness, syncope, seizure activity       Objective:    BP 140/88   Pulse 92   Temp 98.7 F (37.1 C) (Temporal)   Resp 14   Ht 4\' 11"  (1.499 m)   Wt 197 lb (89.4 kg)   SpO2 96%   BMI 39.79  kg/m  GEN- NAD, alert and oriented x3 HEENT- PERRL, EOMI, non injected sclera, pink conjunctiva, MMM, oropharynx clear Neck- Supple, no thyromegaly CVS-regular rhythm normal rate no murmur RESP-CTAB ABD-NABS,soft,NT,ND EXT-pedal edema bunion left foot preulcerative callus beneath the great toe and the lateral aspect mild tenderness to palpation Pulses- Radial, DP- 2+        Assessment & Plan:  Patient given info for COVID-19 vaccine.  She was due for pneumonia booster this was given in the office today.   Problem List Items Addressed This Visit      Unprioritized   Atrial fibrillation ([place])   Bunion of great toe   Relevant Orders   Ambulatory referral to Podiatry   CHF (congestive heart failure) ([place])    She is not symptomatic.  She does have some pedal edema today.  She is not taking her water pill daily      Diabetes mellitus type II, uncontrolled ([place])    Diabetes is uncontrolled but slowly improving.  Will obtain the last A1c from her endocrinologist as well as office visit.  She is on statin drug and ARB      Relevant Orders   HM DIABETES FOOT EXAM (Completed)  Essential hypertension - Primary    Blood pressure is still mildly elevated.  However she is getting cardioversion in a couple weeks I would not change her medication and defer to cardiology.      Relevant Orders   CBC with Differential/Platelet   Comprehensive metabolic panel   Lipid panel   Fibromyalgia    Fibromyalgia along with some osteoarthritis.  Continue as needed hydrocodone which she does not take daily.      History of cerebrovascular accident (CVA) with residual deficit   Hyperlipidemia   Relevant Orders   Lipid panel   Obesity    Other Visit Diagnoses    Pre-ulcerative calluses       Relevant Orders   Ambulatory referral to Podiatry   Need for vaccination       Relevant Orders   Pneumococcal polysaccharide vaccine 23-valent greater than or equal to 2yo subcutaneous/IM (Completed)       Note: This dictation was prepared with Dragon dictation along with smaller phrase technology. Any transcriptional errors that result from this process are unintentional.

## 2019-08-15 NOTE — Assessment & Plan Note (Signed)
Diabetes is uncontrolled but slowly improving.  Will obtain the last A1c from her endocrinologist as well as office visit.  She is on statin drug and ARB

## 2019-08-20 ENCOUNTER — Other Ambulatory Visit: Payer: Self-pay | Admitting: Family Medicine

## 2019-08-20 DIAGNOSIS — Z1231 Encounter for screening mammogram for malignant neoplasm of breast: Secondary | ICD-10-CM

## 2019-08-23 ENCOUNTER — Other Ambulatory Visit: Payer: Self-pay | Admitting: Family Medicine

## 2019-08-31 ENCOUNTER — Other Ambulatory Visit: Payer: Self-pay | Admitting: *Deleted

## 2019-08-31 MED ORDER — FLUCONAZOLE 150 MG PO TABS: 150.0000 mg | ORAL_TABLET | Freq: Once | ORAL | 0 refills | Status: AC

## 2019-08-31 MED ORDER — NYSTATIN 100000 UNIT/GM EX CREA: 1.0000 "application " | TOPICAL_CREAM | Freq: Two times a day (BID) | CUTANEOUS | 0 refills | Status: AC

## 2019-09-03 NOTE — Patient Instructions (Signed)
Tina [name]  09/03/2019     @PREFPERIOPPHARMACY @   Your procedure is scheduled on  09/11/2019 .  Report to [facility] at  1115  A.M.  Call this number if you have problems the morning of surgery:  [phone]   Remember:  Do not eat or drink after midnight.                         Take these medicines the morning of surgery with A SIP OF WATER flecanide, hydrocodone(if needed), losartan, protonix, paxil. Take 1/2 of your usual night time insulin(25 u), the night before your procedure. DO NOT take any medications for diabetes the morning of your procedure.    Do not wear jewelry, make-up or nail polish.  Do not wear lotions, powders, or perfumes. Please wear deodorant and brush your teeth.  Do not shave 48 hours prior to surgery.  Men may shave face and neck.  Do not bring valuables to the hospital.  [facility] is not responsible for any belongings or valuables.  Contacts, dentures or bridgework may not be worn into surgery.  Leave your suitcase in the car.  After surgery it may be brought to your room.  For patients admitted to the hospital, discharge time will be determined by your treatment team.  Patients discharged the day of surgery will not be allowed to drive home.   Name and phone number of your driver:   daughter-Wren Gallaga Special instructions:  DO NOT smoke the morning of your procedure and DO NOT miss any doses of your eliquis before your procedure.  Please read over the following fact sheets that you were given. Anesthesia Post-op Instructions and Care and Recovery After Surgery       Electrical Cardioversion Electrical cardioversion is the delivery of a jolt of electricity to restore a normal rhythm to the heart. A rhythm that is too fast or is not regular keeps the heart from pumping well. In this procedure, sticky patches or metal paddles are placed on the chest to deliver electricity to the heart from a device. This procedure may be done in an  emergency if:  There is low or no blood pressure as a result of the heart rhythm.  Normal rhythm must be restored as fast as possible to protect the brain and heart from further damage.  It may save a life. This may also be a scheduled procedure for irregular or fast heart rhythms that are not immediately life-threatening. Tell a health care provider about:  Any allergies you have.  All medicines you are taking, including vitamins, herbs, eye drops, creams, and over-the-counter medicines.  Any problems you or family members have had with anesthetic medicines.  Any blood disorders you have.  Any surgeries you have had.  Any medical conditions you have.  Whether you are pregnant or may be pregnant. What are the risks? Generally, this is a safe procedure. However, problems may occur, including:  Allergic reactions to medicines.  A blood clot that breaks free and travels to other parts of your body.  The possible return of an abnormal heart rhythm within hours or days after the procedure.  Your heart stopping (cardiac arrest). This is rare. What happens before the procedure? Medicines  Your health care provider may have you start taking: ? Blood-thinning medicines (anticoagulants) so your blood does not clot as easily. ? Medicines to help stabilize your heart rate and rhythm.  Ask your health care provider about: ? Changing or stopping your regular medicines. This is especially important if you are taking diabetes medicines or blood thinners. ? Taking medicines such as aspirin and ibuprofen. These medicines can thin your blood. Do not take these medicines unless your health care provider tells you to take them. ? Taking over-the-counter medicines, vitamins, herbs, and supplements. General instructions  Follow instructions from your health care provider about eating or drinking restrictions.  Plan to have someone take you home from the hospital or clinic.  If you will be  going home right after the procedure, plan to have someone with you for 24 hours.  Ask your health care provider what steps will be taken to help prevent infection. These may include washing your skin with a germ-killing soap. What happens during the procedure?   An IV will be inserted into one of your veins.  Sticky patches (electrodes) or metal paddles may be placed on your chest.  You will be given a medicine to help you relax (sedative).  An electrical shock will be delivered. The procedure may vary among health care providers and hospitals. What can I expect after the procedure?  Your blood pressure, heart rate, breathing rate, and blood oxygen level will be monitored until you leave the hospital or clinic.  Your heart rhythm will be watched to make sure it does not change.  You may have some redness on the skin where the shocks were given. Follow these instructions at home:  Do not drive for 24 hours if you were given a sedative during your procedure.  Take over-the-counter and prescription medicines only as told by your health care provider.  Ask your health care provider how to check your pulse. Check it often.  Rest for 48 hours after the procedure or as told by your health care provider.  Avoid or limit your caffeine use as told by your health care provider.  Keep all follow-up visits as told by your health care provider. This is important. Contact a health care provider if:  You feel like your heart is beating too quickly or your pulse is not regular.  You have a serious muscle cramp that does not go away. Get help right away if:  You have discomfort in your chest.  You are dizzy or you feel faint.  You have trouble breathing or you are short of breath.  Your speech is slurred.  You have trouble moving an arm or leg on one side of your body.  Your fingers or toes turn cold or blue. Summary  Electrical cardioversion is the delivery of a jolt of  electricity to restore a normal rhythm to the heart.  This procedure may be done right away in an emergency or may be a scheduled procedure if the condition is not an emergency.  Generally, this is a safe procedure.  After the procedure, check your pulse often as told by your health care provider. This information is not intended to replace advice given to you by your health care provider. Make sure you discuss any questions you have with your health care provider. Document Revised: 12/11/2018 Document Reviewed: 12/11/2018 Elsevier Patient Education  2020 [employer] Monitored Anesthesia Care, Care After These instructions provide you with information about caring for yourself after your procedure. Your health care provider may also give you more specific instructions. Your treatment has been planned according to current medical practices, but problems sometimes occur. Call your health care provider if you  have any problems or questions after your procedure. What can I expect after the procedure? After your procedure, you may:  Feel sleepy for several hours.  Feel clumsy and have poor balance for several hours.  Feel forgetful about what happened after the procedure.  Have poor judgment for several hours.  Feel nauseous or vomit.  Have a sore throat if you had a breathing tube during the procedure. Follow these instructions at home: For at least 24 hours after the procedure:      Have a responsible adult stay with you. It is important to have someone help care for you until you are awake and alert.  Rest as needed.  Do not: ? Participate in activities in which you could fall or become injured. ? Drive. ? Use heavy machinery. ? Drink alcohol. ? Take sleeping pills or medicines that cause drowsiness. ? Make important decisions or sign legal documents. ? Take care of children on your own. Eating and drinking  Follow the diet that is recommended by your health care  provider.  If you vomit, drink water, juice, or soup when you can drink without vomiting.  Make sure you have little or no nausea before eating solid foods. General instructions  Take over-the-counter and prescription medicines only as told by your health care provider.  If you have sleep apnea, surgery and certain medicines can increase your risk for breathing problems. Follow instructions from your health care provider about wearing your sleep device: ? Anytime you are sleeping, including during daytime naps. ? While taking prescription pain medicines, sleeping medicines, or medicines that make you drowsy.  If you smoke, do not smoke without supervision.  Keep all follow-up visits as told by your health care provider. This is important. Contact a health care provider if:  You keep feeling nauseous or you keep vomiting.  You feel light-headed.  You develop a rash.  You have a fever. Get help right away if:  You have trouble breathing. Summary  For several hours after your procedure, you may feel sleepy and have poor judgment.  Have a responsible adult stay with you for at least 24 hours or until you are awake and alert. This information is not intended to replace advice given to you by your health care provider. Make sure you discuss any questions you have with your health care provider. Document Revised: 08/08/2017 Document Reviewed: 08/31/2015 Elsevier Patient Education  2020 [employer].

## 2019-09-05 ENCOUNTER — Ambulatory Visit: Payer: Medicare Other | Admitting: Internal Medicine

## 2019-09-05 ENCOUNTER — Encounter: Payer: Self-pay | Admitting: Internal Medicine

## 2019-09-05 ENCOUNTER — Other Ambulatory Visit: Payer: Self-pay | Admitting: Family Medicine

## 2019-09-05 ENCOUNTER — Other Ambulatory Visit: Payer: Self-pay

## 2019-09-05 VITALS — BP 124/74 | HR 57 | Temp 96.8°F | Ht 59.0 in | Wt 193.0 lb

## 2019-09-05 DIAGNOSIS — I4819 Other persistent atrial fibrillation: Secondary | ICD-10-CM

## 2019-09-05 MED ORDER — FLECAINIDE ACETATE 100 MG PO TABS: 100.0000 mg | ORAL_TABLET | Freq: Two times a day (BID) | ORAL | 11 refills | Status: AC

## 2019-09-05 NOTE — Addendum Note (Signed)
Addended by: Kerney Elbe on: 09/05/2019 12:55 PM   Modules accepted: Orders

## 2019-09-05 NOTE — Progress Notes (Signed)
HPI [name] returns today for followup of persistent atrial fib. She is a pleasant morbidly obese 69 yo woman with HTN and persistent atrial fib. She was placed on flecainide after experiencing ERAF after DCCV a couple of months ago. She has continued in atrial fib on flecainide 50 mg bid. She denies chest pain or syncope. She has dyspnea with exertion.  Allergies  Allergen Reactions  . Cefuroxime Axetil Other (See Comments)    Reaction not recalled by the patient  . Fentanyl And Related Other (See Comments)    Patch: unknown reaction by patient  . Niaspan [Niacin Er] Other (See Comments)    Burning all over  . Sulfa Antibiotics Nausea And Vomiting  . Vasotec [Enalapril] Cough  . Welchol [Colesevelam Hcl] Other (See Comments)    Muscle aches and joint pains  . Lyrica [Pregabalin] Rash    Blistering rash around same time as starting drug  . Penicillins Hives and Rash    Has patient had a PCN reaction causing immediate rash, facial/tongue/throat swelling, SOB or lightheadedness with hypotension: Yes Has patient had a PCN reaction causing severe rash involving mucus membranes or skin necrosis: Yes Has patient had a PCN reaction that required hospitalization: No Has patient had a PCN reaction occurring within the last 10 years: No If all of the above answers are "NO", then may proceed with Cephalosporin use.      Current Outpatient Medications  Medication Sig Dispense Refill  . aspirin EC 81 MG EC tablet Take 1 tablet (81 mg total) by mouth daily. (Patient taking differently: Take 81 mg by mouth at bedtime. ) 30 tablet 4  . cetirizine (ZYRTEC) 10 MG tablet Take 10 mg by mouth daily.    Marland Kitchen ELIQUIS 5 MG TABS tablet TAKE (1) TABLET TWICE DAILY. 60 tablet 0  . Empagliflozin-metFORMIN HCl (SYNJARDY) 12.5-500 MG TABS Take 1 tablet by mouth 2 (two) times daily.    . fenofibrate 54 MG tablet Take 1 tablet (54 mg total) by mouth daily. 90 tablet 0  . flecainide (TAMBOCOR) 50 MG  tablet Take 50 mg by mouth 2 (two) times daily.    Marland Kitchen HYDROcodone-acetaminophen (NORCO) 5-325 MG tablet Take 1 tablet by mouth 2 (two) times daily as needed for moderate pain. 30 tablet 0  . insulin glargine (LANTUS) 100 unit/mL SOPN Inject 50 Units into the skin 2 (two) times daily.    Marland Kitchen losartan (COZAAR) 25 MG tablet Take 1 tablet (25 mg total) by mouth daily. 90 tablet 2  . metoprolol tartrate (LOPRESSOR) 25 MG tablet Take 1 tablet (25 mg total) by mouth 2 (two) times daily. 180 tablet 0  . nystatin cream (MYCOSTATIN) Apply 1 application topically 2 (two) times daily. 30 g 0  . pantoprazole (PROTONIX) 40 MG tablet Take 1 tablet (40 mg total) by mouth daily. 90 tablet 1  . PARoxetine (PAXIL) 40 MG tablet Take 1 tablet (40 mg total) by mouth every morning. 90 tablet 0  . potassium chloride (KLOR-CON) 20 MEQ packet Take 20 mEq by mouth daily as needed (taken with Torsemide).     . rosuvastatin (CRESTOR) 40 MG tablet TAKE 1 TABLET DAILY. 30 tablet 3  . torsemide (DEMADEX) 20 MG tablet Take 40 mg by mouth daily as needed (fluid / edema).      No current facility-administered medications for this visit.     Past Medical History:  Diagnosis Date  . Atrial fibrillation ([place])    a. diagnosed in 05/2017 -->  started on Eliquis for anticoagulation  . Carotid artery stenosis   . CHF (congestive heart failure) (H[facility])   . Depression   . Diabetes mellitus   . Fibromyalgia   . GERD (gastroesophageal reflux disease)   . Hyperlipidemia   . Hypertension   . Stroke ([facility])   . Superficial thrombophlebitis    right leg    ROS:   All systems reviewed and negative except as noted in the HPI.   Past Surgical History:  Procedure Laterality Date  . CARDIOVERSION N/A 01/25/2019   Procedure: CARDIOVERSION;  Surgeon: Jonelle Sidle, MD;  Location: [facility];  Service: Cardiovascular;  Laterality: N/A;  . CHOLECYSTECTOMY  1991  . HERNIA REPAIR  approx 1969  . NECK SURGERY  1998  . TONSILLECTOMY AND  ADENOIDECTOMY  1966     Family History  Problem Relation Age of Onset  . Hypertension Mother   . Hyperlipidemia Mother   . Heart disease Mother   . Stroke Sister   . Hypertension Sister   . Hyperlipidemia Sister   . Heart disease Sister   . Diabetes Maternal Grandmother   . Diabetes Daughter      Social History   Socioeconomic History  . Marital status: Married    Spouse name: Not on file  . Number of children: Not on file  . Years of education: Not on file  . Highest education level: Not on file  Occupational History  . Not on file  Tobacco Use  . Smoking status: Former Smoker    Types: Cigarettes  . Smokeless tobacco: Never Used  Substance and Sexual Activity  . Alcohol use: No  . Drug use: No  . Sexual activity: Not Currently  Other Topics Concern  . Not on file  Social History Narrative  . Not on file   Social Determinants of Health   Financial Resource Strain:   . Difficulty of Paying Living Expenses:   Food Insecurity:   . Worried About Programme researcher, broadcasting/film/video in the Last Year:   . Barista in the Last Year:   Transportation Needs:   . Freight forwarder (Medical):   Marland Kitchen Lack of Transportation (Non-Medical):   Physical Activity:   . Days of Exercise per Week:   . Minutes of Exercise per Session:   Stress:   . Feeling of Stress :   Social Connections:   . Frequency of Communication with Friends and Family:   . Frequency of Social Gatherings with Friends and Family:   . Attends Religious Services:   . Active Member of Clubs or Organizations:   . Attends Banker Meetings:   Marland Kitchen Marital Status:   Intimate Partner Violence:   . Fear of Current or Ex-Partner:   . Emotionally Abused:   Marland Kitchen Physically Abused:   . Sexually Abused:      BP 124/74 (BP Location: Left Arm)   Pulse (!) 57   Temp (!) 96.8 F (36 C)   Ht 4\' 11"  (1.499 m)   Wt 193 lb (87.5 kg)   SpO2 91%   BMI 38.98 kg/m   Physical Exam:  Well appearing NAD HEENT:  Unremarkable Neck:  No JVD, no thyromegally Lymphatics:  No adenopathy Back:  No CVA tenderness Lungs:  Clear HEART:  Regular rate rhythm, no murmurs, no rubs, no clicks Abd:  soft, positive bowel sounds, no organomegally, no rebound, no guarding Ext:  2 plus pulses, no edema, no cyanosis, no clubbing Skin:  No rashes no nodules Neuro:  CN II through XII intact, motor grossly intact  EKG - atrial fib with   Assess/Plan: 1. Atrial fib - her rates are better and her QRS duration is acceptable. I have recommended she increase the flecainide to 100 mg twice daily and she will undergo DCCV again next week. If she has ERAF then we will discuss AV node ablation and PPM 2. Obesity - she is morbdily obese and is encouraged to lose weight. 3. HTN  - her bp is controlled today. No change in meds.  4. Systemic anti-coagulation - she denies missing any meds and has had no bleeding.  

## 2019-09-05 NOTE — H&P (View-Only) (Signed)
HPI [name] returns today for followup of persistent atrial fib. She is a pleasant morbidly obese 69 yo woman with HTN and persistent atrial fib. She was placed on flecainide after experiencing ERAF after DCCV a couple of months ago. She has continued in atrial fib on flecainide 50 mg bid. She denies chest pain or syncope. She has dyspnea with exertion.  Allergies  Allergen Reactions  . Cefuroxime Axetil Other (See Comments)    Reaction not recalled by the patient  . Fentanyl And Related Other (See Comments)    Patch: unknown reaction by patient  . Niaspan [Niacin Er] Other (See Comments)    Burning all over  . Sulfa Antibiotics Nausea And Vomiting  . Vasotec [Enalapril] Cough  . Welchol [Colesevelam Hcl] Other (See Comments)    Muscle aches and joint pains  . Lyrica [Pregabalin] Rash    Blistering rash around same time as starting drug  . Penicillins Hives and Rash    Has patient had a PCN reaction causing immediate rash, facial/tongue/throat swelling, SOB or lightheadedness with hypotension: Yes Has patient had a PCN reaction causing severe rash involving mucus membranes or skin necrosis: Yes Has patient had a PCN reaction that required hospitalization: No Has patient had a PCN reaction occurring within the last 10 years: No If all of the above answers are "NO", then may proceed with Cephalosporin use.      Current Outpatient Medications  Medication Sig Dispense Refill  . aspirin EC 81 MG EC tablet Take 1 tablet (81 mg total) by mouth daily. (Patient taking differently: Take 81 mg by mouth at bedtime. ) 30 tablet 4  . cetirizine (ZYRTEC) 10 MG tablet Take 10 mg by mouth daily.    Marland Kitchen ELIQUIS 5 MG TABS tablet TAKE (1) TABLET TWICE DAILY. 60 tablet 0  . Empagliflozin-metFORMIN HCl (SYNJARDY) 12.5-500 MG TABS Take 1 tablet by mouth 2 (two) times daily.    . fenofibrate 54 MG tablet Take 1 tablet (54 mg total) by mouth daily. 90 tablet 0  . flecainide (TAMBOCOR) 50 MG  tablet Take 50 mg by mouth 2 (two) times daily.    Marland Kitchen HYDROcodone-acetaminophen (NORCO) 5-325 MG tablet Take 1 tablet by mouth 2 (two) times daily as needed for moderate pain. 30 tablet 0  . insulin glargine (LANTUS) 100 unit/mL SOPN Inject 50 Units into the skin 2 (two) times daily.    Marland Kitchen losartan (COZAAR) 25 MG tablet Take 1 tablet (25 mg total) by mouth daily. 90 tablet 2  . metoprolol tartrate (LOPRESSOR) 25 MG tablet Take 1 tablet (25 mg total) by mouth 2 (two) times daily. 180 tablet 0  . nystatin cream (MYCOSTATIN) Apply 1 application topically 2 (two) times daily. 30 g 0  . pantoprazole (PROTONIX) 40 MG tablet Take 1 tablet (40 mg total) by mouth daily. 90 tablet 1  . PARoxetine (PAXIL) 40 MG tablet Take 1 tablet (40 mg total) by mouth every morning. 90 tablet 0  . potassium chloride (KLOR-CON) 20 MEQ packet Take 20 mEq by mouth daily as needed (taken with Torsemide).     . rosuvastatin (CRESTOR) 40 MG tablet TAKE 1 TABLET DAILY. 30 tablet 3  . torsemide (DEMADEX) 20 MG tablet Take 40 mg by mouth daily as needed (fluid / edema).      No current facility-administered medications for this visit.     Past Medical History:  Diagnosis Date  . Atrial fibrillation ([place])    a. diagnosed in 05/2017 -->  started on Eliquis for anticoagulation  . Carotid artery stenosis   . CHF (congestive heart failure) (H[facility])   . Depression   . Diabetes mellitus   . Fibromyalgia   . GERD (gastroesophageal reflux disease)   . Hyperlipidemia   . Hypertension   . Stroke ([facility])   . Superficial thrombophlebitis    right leg    ROS:   All systems reviewed and negative except as noted in the HPI.   Past Surgical History:  Procedure Laterality Date  . CARDIOVERSION N/A 01/25/2019   Procedure: CARDIOVERSION;  Surgeon: Jonelle Sidle, MD;  Location: [facility];  Service: Cardiovascular;  Laterality: N/A;  . CHOLECYSTECTOMY  1991  . HERNIA REPAIR  approx 1969  . NECK SURGERY  1998  . TONSILLECTOMY AND  ADENOIDECTOMY  1966     Family History  Problem Relation Age of Onset  . Hypertension Mother   . Hyperlipidemia Mother   . Heart disease Mother   . Stroke Sister   . Hypertension Sister   . Hyperlipidemia Sister   . Heart disease Sister   . Diabetes Maternal Grandmother   . Diabetes Daughter      Social History   Socioeconomic History  . Marital status: Married    Spouse name: Not on file  . Number of children: Not on file  . Years of education: Not on file  . Highest education level: Not on file  Occupational History  . Not on file  Tobacco Use  . Smoking status: Former Smoker    Types: Cigarettes  . Smokeless tobacco: Never Used  Substance and Sexual Activity  . Alcohol use: No  . Drug use: No  . Sexual activity: Not Currently  Other Topics Concern  . Not on file  Social History Narrative  . Not on file   Social Determinants of Health   Financial Resource Strain:   . Difficulty of Paying Living Expenses:   Food Insecurity:   . Worried About Programme researcher, broadcasting/film/video in the Last Year:   . Barista in the Last Year:   Transportation Needs:   . Freight forwarder (Medical):   Marland Kitchen Lack of Transportation (Non-Medical):   Physical Activity:   . Days of Exercise per Week:   . Minutes of Exercise per Session:   Stress:   . Feeling of Stress :   Social Connections:   . Frequency of Communication with Friends and Family:   . Frequency of Social Gatherings with Friends and Family:   . Attends Religious Services:   . Active Member of Clubs or Organizations:   . Attends Banker Meetings:   Marland Kitchen Marital Status:   Intimate Partner Violence:   . Fear of Current or Ex-Partner:   . Emotionally Abused:   Marland Kitchen Physically Abused:   . Sexually Abused:      BP 124/74 (BP Location: Left Arm)   Pulse (!) 57   Temp (!) 96.8 F (36 C)   Ht 4\' 11"  (1.499 m)   Wt 193 lb (87.5 kg)   SpO2 91%   BMI 38.98 kg/m   Physical Exam:  Well appearing NAD HEENT:  Unremarkable Neck:  No JVD, no thyromegally Lymphatics:  No adenopathy Back:  No CVA tenderness Lungs:  Clear HEART:  Regular rate rhythm, no murmurs, no rubs, no clicks Abd:  soft, positive bowel sounds, no organomegally, no rebound, no guarding Ext:  2 plus pulses, no edema, no cyanosis, no clubbing Skin:  No rashes no nodules Neuro:  CN II through XII intact, motor grossly intact  EKG - atrial fib with   Assess/Plan: 1. Atrial fib - her rates are better and her QRS duration is acceptable. I have recommended she increase the flecainide to 100 mg twice daily and she will undergo DCCV again next week. If she has ERAF then we will discuss AV node ablation and PPM 2. Obesity - she is morbdily obese and is encouraged to lose weight. 3. HTN  - her bp is controlled today. No change in meds.  4. Systemic anti-coagulation - she denies missing any meds and has had no bleeding.

## 2019-09-05 NOTE — Addendum Note (Signed)
Addended by: Kerney Elbe on: 09/05/2019 11:42 AM   Modules accepted: Orders

## 2019-09-05 NOTE — Patient Instructions (Signed)
Medication Instructions:  Your physician has recommended you make the following change in your medication:  Increase Flecainide to 100 mg Two Times Daily   *If you need a refill on your cardiac medications before your next appointment, please call your pharmacy*   Lab Work: NONE   If you have labs (blood work) drawn today and your tests are completely normal, you will receive your results only by: Marland Kitchen MyChart Message (if you have MyChart) OR . A paper copy in the mail If you have any lab test that is abnormal or we need to change your treatment, we will call you to review the results.   Testing/Procedures: NONE    Follow-Up: At [facility], you and your health needs are our priority.  As part of our continuing mission to provide you with exceptional heart care, we have created designated Provider Care Teams.  These Care Teams include your primary Cardiologist (physician) and Advanced Practice Providers (APPs -  Physician Assistants and Nurse Practitioners) who all work together to provide you with the care you need, when you need it.  We recommend signing up for the patient portal called "MyChart".  Sign up information is provided on this After Visit Summary.  MyChart is used to connect with patients for Virtual Visits (Telemedicine).  Patients are able to view lab/test results, encounter notes, upcoming appointments, etc.  Non-urgent messages can be sent to your provider as well.   To learn more about what you can do with MyChart, go to [email or web address].    Your next appointment:   4 week(s)  The format for your next appointment:   In Person  Provider:   Lewayne Bunting, MD   Other Instructions Thank you for choosing [facility]!

## 2019-09-06 ENCOUNTER — Other Ambulatory Visit: Payer: Self-pay

## 2019-09-06 ENCOUNTER — Encounter (HOSPITAL_COMMUNITY): Payer: Self-pay

## 2019-09-06 ENCOUNTER — Encounter (HOSPITAL_COMMUNITY)
Admission: RE | Admit: 2019-09-06 | Discharge: 2019-09-06 | Disposition: A | Discharge status: Home / Self Care | Payer: Medicare Other | Source: Ambulatory Visit | Attending: Cardiology | Admitting: Cardiology

## 2019-09-06 DIAGNOSIS — Z01812 Encounter for preprocedural laboratory examination: Secondary | ICD-10-CM | POA: Diagnosis not present

## 2019-09-06 LAB — BASIC METABOLIC PANEL
Anion gap: 12 (ref 5–15)
BUN: 23 mg/dL (ref 8–23)
CO2: 27 mmol/L (ref 22–32)
Calcium: 9.1 mg/dL (ref 8.9–10.3)
Chloride: 101 mmol/L (ref 98–111)
Creatinine, Ser: 1 mg/dL (ref 0.44–1.00)
GFR calc Af Amer: >60 mL/min (ref >60)
GFR calc non Af Amer: 58 mL/min — ABNORMAL LOW (ref >60)
Glucose, Bld: 280 mg/dL — ABNORMAL HIGH (ref 70–99)
Potassium: 3.4 mmol/L — ABNORMAL LOW (ref 3.5–5.1)
Sodium: 140 mmol/L (ref 135–145)

## 2019-09-06 LAB — HEMOGLOBIN A1C
Hgb A1c MFr Bld: 9 % — ABNORMAL HIGH (ref 4.8–5.6)
Mean Plasma Glucose: 211.6 mg/dL

## 2019-09-07 ENCOUNTER — Other Ambulatory Visit (HOSPITAL_COMMUNITY)
Admission: RE | Admit: 2019-09-07 | Discharge: 2019-09-07 | Disposition: A | Discharge status: Home / Self Care | Payer: Medicare Other | Source: Ambulatory Visit | Attending: Cardiology | Admitting: Cardiology

## 2019-09-07 DIAGNOSIS — Z20822 Contact with and (suspected) exposure to covid-19: Secondary | ICD-10-CM | POA: Diagnosis not present

## 2019-09-07 DIAGNOSIS — Z01812 Encounter for preprocedural laboratory examination: Secondary | ICD-10-CM | POA: Insufficient documentation

## 2019-09-07 NOTE — Pre-Procedure Instructions (Signed)
HgbA1C routed to PCP. 

## 2019-09-08 LAB — SARS CORONAVIRUS 2 (TAT 6-24 HRS): SARS Coronavirus 2: NEGATIVE

## 2019-09-11 ENCOUNTER — Encounter (HOSPITAL_COMMUNITY)
Admission: RE | Disposition: A | Discharge status: Home / Self Care | Payer: Medicare Other | Source: Home / Self Care | Attending: Cardiovascular Disease

## 2019-09-11 ENCOUNTER — Ambulatory Visit (HOSPITAL_COMMUNITY): Payer: Medicare Other | Admitting: Anesthesiology

## 2019-09-11 ENCOUNTER — Ambulatory Visit (HOSPITAL_COMMUNITY)
Admission: RE | Admit: 2019-09-11 | Discharge: 2019-09-11 | Disposition: A | Discharge status: Home / Self Care | Payer: Medicare Other | Attending: Cardiovascular Disease | Admitting: Cardiovascular Disease

## 2019-09-11 ENCOUNTER — Encounter (HOSPITAL_COMMUNITY): Payer: Self-pay | Admitting: Cardiovascular Disease

## 2019-09-11 ENCOUNTER — Telehealth: Payer: Self-pay

## 2019-09-11 DIAGNOSIS — I509 Heart failure, unspecified: Secondary | ICD-10-CM | POA: Insufficient documentation

## 2019-09-11 DIAGNOSIS — I4819 Other persistent atrial fibrillation: Secondary | ICD-10-CM | POA: Diagnosis not present

## 2019-09-11 DIAGNOSIS — Z881 Allergy status to other antibiotic agents status: Secondary | ICD-10-CM | POA: Insufficient documentation

## 2019-09-11 DIAGNOSIS — Z885 Allergy status to narcotic agent status: Secondary | ICD-10-CM | POA: Diagnosis not present

## 2019-09-11 DIAGNOSIS — Z87891 Personal history of nicotine dependence: Secondary | ICD-10-CM | POA: Insufficient documentation

## 2019-09-11 DIAGNOSIS — Z7982 Long term (current) use of aspirin: Secondary | ICD-10-CM | POA: Diagnosis not present

## 2019-09-11 DIAGNOSIS — E785 Hyperlipidemia, unspecified: Secondary | ICD-10-CM | POA: Diagnosis not present

## 2019-09-11 DIAGNOSIS — G4730 Sleep apnea, unspecified: Secondary | ICD-10-CM | POA: Insufficient documentation

## 2019-09-11 DIAGNOSIS — I110 Hypertensive heart disease with heart failure: Secondary | ICD-10-CM | POA: Insufficient documentation

## 2019-09-11 DIAGNOSIS — Z882 Allergy status to sulfonamides status: Secondary | ICD-10-CM | POA: Diagnosis not present

## 2019-09-11 DIAGNOSIS — F329 Major depressive disorder, single episode, unspecified: Secondary | ICD-10-CM | POA: Insufficient documentation

## 2019-09-11 DIAGNOSIS — Z7901 Long term (current) use of anticoagulants: Secondary | ICD-10-CM | POA: Insufficient documentation

## 2019-09-11 DIAGNOSIS — I6930 Unspecified sequelae of cerebral infarction: Secondary | ICD-10-CM | POA: Diagnosis not present

## 2019-09-11 DIAGNOSIS — K219 Gastro-esophageal reflux disease without esophagitis: Secondary | ICD-10-CM | POA: Insufficient documentation

## 2019-09-11 DIAGNOSIS — M797 Fibromyalgia: Secondary | ICD-10-CM | POA: Diagnosis not present

## 2019-09-11 DIAGNOSIS — I4891 Unspecified atrial fibrillation: Secondary | ICD-10-CM | POA: Diagnosis not present

## 2019-09-11 DIAGNOSIS — Z880 Allergy status to penicillin: Secondary | ICD-10-CM | POA: Insufficient documentation

## 2019-09-11 DIAGNOSIS — E119 Type 2 diabetes mellitus without complications: Secondary | ICD-10-CM | POA: Diagnosis not present

## 2019-09-11 DIAGNOSIS — Z794 Long term (current) use of insulin: Secondary | ICD-10-CM | POA: Diagnosis not present

## 2019-09-11 DIAGNOSIS — Z8672 Personal history of thrombophlebitis: Secondary | ICD-10-CM | POA: Diagnosis not present

## 2019-09-11 HISTORY — PX: CARDIOVERSION: SHX1299

## 2019-09-11 LAB — PROTIME-INR
INR: 1.2 (ref 0.8–1.2)
Prothrombin Time: 15 seconds (ref 11.4–15.2)

## 2019-09-11 LAB — GLUCOSE, CAPILLARY
Glucose-Capillary: 131 mg/dL — ABNORMAL HIGH (ref 70–99)
Glucose-Capillary: 96 mg/dL (ref 70–99)

## 2019-09-11 SURGERY — CARDIOVERSION
Anesthesia: General

## 2019-09-11 MED ORDER — LACTATED RINGERS IV SOLN: INTRAVENOUS | Status: DC

## 2019-09-11 MED ORDER — PROPOFOL 10 MG/ML IV BOLUS
INTRAVENOUS | Status: DC | PRN
  Administered 2019-09-11: 80 mg via INTRAVENOUS

## 2019-09-11 MED ORDER — GLYCOPYRROLATE 0.2 MG/ML IJ SOLN
INTRAMUSCULAR | Status: AC
  Filled 2019-09-11: qty 1

## 2019-09-11 MED ORDER — ATROPINE SULFATE 0.4 MG/ML IJ SOLN
INTRAMUSCULAR | Status: DC | PRN
  Administered 2019-09-11: 1 mg via INTRAVENOUS

## 2019-09-11 NOTE — Anesthesia Postprocedure Evaluation (Signed)
Anesthesia Post Note  Patient: Tina [name]  Procedure(s) Performed: CARDIOVERSION (N/A )  Patient location during evaluation: PACU Anesthesia Type: General Level of consciousness: awake and alert and patient cooperative Pain management: satisfactory to patient Vital Signs Assessment: post-procedure vital signs reviewed and stable Respiratory status: spontaneous breathing Cardiovascular status: stable Postop Assessment: no apparent nausea or vomiting Anesthetic complications: no     Last Vitals:  Vitals:   09/11/19 1147 09/11/19 1347  Pulse: 84 (!) (P) 55  Resp: 16   Temp: 36.6 C   SpO2:  (P) 96%    Last Pain:  Vitals:   09/11/19 1147  TempSrc: Oral                 Yilia Sacca

## 2019-09-11 NOTE — Progress Notes (Signed)
Pt will stop the metoprolol per Dr. Purvis Sheffield for now until he instructs her further.

## 2019-09-11 NOTE — Progress Notes (Signed)
Electrical Cardioversion Procedure Note Tina [name] [id number] [birth year]  Procedure: Electrical Cardioversion Indications:  Atrial Fibrillation  Procedure Details Consent: Risks of procedure as well as the alternatives and risks of each were explained to the (patient/caregiver).  Consent for procedure obtained. Time Out: Verified patient identification, verified procedure, site/side was marked, verified correct patient position, special equipment/implants available, medications/allergies/relevent history reviewed, required imaging and test results available.  Performed  Patient placed on cardiac monitor, pulse oximetry, supplemental oxygen as necessary.  Sedation given: propofol Pacer pads placed anterior and posterior chest.  Cardioverted 1 time(s).  Cardioverted at 150J.  Evaluation Findings: Post procedure EKG shows: junctional/some sinus beats and frequent ectopy Complications: hypotension resolved Patient did tolerate procedure well.   Tina [name] S 09/11/2019, 1:45 PM

## 2019-09-11 NOTE — Transfer of Care (Signed)
Immediate Anesthesia Transfer of Care Note  Patient: Tina [name]  Procedure(s) Performed: CARDIOVERSION (N/A )  Patient Location: PACU  Anesthesia Type:General  Level of Consciousness: awake and patient cooperative  Airway & Oxygen Therapy: Patient Spontanous Breathing and Patient connected to nasal cannula oxygen  Post-op Assessment: Report given to RN and Post -op Vital signs reviewed and stable  Post vital signs: Reviewed and stable  Last Vitals:  Vitals Value Taken Time  BP    Temp    Pulse    Resp    SpO2      Last Pain:  Vitals:   09/11/19 1147  TempSrc: Oral         Complications: No apparent anesthesia complications  SEE PACU FLOWSHEET FOR VITAL SIGNS

## 2019-09-11 NOTE — Telephone Encounter (Signed)
Also tell Courney Garrod to hold metoprolol indefinitely. She will need very close fu with Ladona Ridgel, sooner than later.

## 2019-09-11 NOTE — Telephone Encounter (Signed)
I spoke with god daughter Diamantina Providence (on [place]) ,  and she will tell her to take potassium(20 meq)  for the next 3 days in addition to 40 meq tonight.

## 2019-09-11 NOTE — Telephone Encounter (Signed)
-----   Message from Laqueta Linden, MD sent at 09/11/2019 12:47 PM EDT ----- Diabetes is poorly controlled.  Potassium is mildly low.  Please have her take 20 mEq daily for the next 3 days.

## 2019-09-11 NOTE — Anesthesia Procedure Notes (Signed)
Procedure Name: MAC Date/Time: 09/11/2019 1:20 PM Performed by: Vista Deck, CRNA Pre-anesthesia Checklist: Patient identified, Emergency Drugs available, Suction available, Timeout performed and Patient being monitored Patient Re-evaluated:Patient Re-evaluated prior to induction Oxygen Delivery Method: Nasal Cannula

## 2019-09-11 NOTE — Anesthesia Preprocedure Evaluation (Signed)
Anesthesia Evaluation  Patient identified by MRN, date of birth, ID band Patient awake    Reviewed: Allergy & Precautions, H&P , NPO status , Patient's Chart, lab work & pertinent test results, reviewed documented beta blocker date and time   Airway Mallampati: II  TM Distance: >3 FB Neck ROM: full    Dental no notable dental hx.    Pulmonary sleep apnea , former smoker,    Pulmonary exam normal breath sounds clear to auscultation       Cardiovascular Exercise Tolerance: Good hypertension, +CHF   Rhythm:regular Rate:Normal     Neuro/Psych PSYCHIATRIC DISORDERS Anxiety Depression  Neuromuscular disease CVA, Residual Symptoms    GI/Hepatic Neg liver ROS, GERD  Medicated,  Endo/Other  negative endocrine ROSdiabetes  Renal/GU negative Renal ROS  negative genitourinary   Musculoskeletal   Abdominal   Peds  Hematology negative hematology ROS (+)   Anesthesia Other Findings   Reproductive/Obstetrics negative OB ROS                             Anesthesia Physical Anesthesia Plan  ASA: III  Anesthesia Plan: General   Post-op Pain Management:    Induction:   PONV Risk Score and Plan: 2 and Propofol infusion  Airway Management Planned:   Additional Equipment:   Intra-op Plan:   Post-operative Plan:   Informed Consent: I have reviewed the patients History and Physical, chart, labs and discussed the procedure including the risks, benefits and alternatives for the proposed anesthesia with the patient or authorized representative who has indicated his/her understanding and acceptance.     Dental Advisory Given  Plan Discussed with: CRNA  Anesthesia Plan Comments:         Anesthesia Quick Evaluation

## 2019-09-11 NOTE — Telephone Encounter (Signed)
Apt with Dr.Taylor moved to  [facility] st 5/12 at 1145 am

## 2019-09-11 NOTE — Addendum Note (Signed)
Addended by: Marlyn Corporal A on: 09/11/2019 02:59 PM   Modules accepted: Orders

## 2019-09-11 NOTE — Procedures (Signed)
Elective direct current cardioversion  Indication: Persistent atrial fibrillation  Description of procedure: After informed consent was obtained and preprocedure evaluation by Anesthesia, patient was taken to the procedure room. Timeout performed. Sedation was managed completely by the Anesthesia service, please refer to their documentation for details. Anterior and posterior chest pads were placed and connected to a biphasic defibrillator. A single synchronized shock of 150J was delivered.  The patient was initially bradycardic and given 1 mg of atropine.  She then developed a junctional rhythm in the 50 bpm range.  She was mildly hypotensive and given 500 cc IV fluid bolus.  Follow-up ECG obtained.  Potassium mildly low at 3.4 for which I have ordered replacement to begin orally today and over the next few days.  I discussed the patient with her family member who was present.  She will follow up with Dr. Ladona Ridgel.  She will likely need a pacemaker.  Prentice Docker, M.D., F.A.C.C.

## 2019-09-11 NOTE — Discharge Instructions (Addendum)
Pt is to stop metoprolol dose at home per Dr. Bronson Ing until further instruction from doctor.   Electrical Cardioversion Electrical cardioversion is the delivery of a jolt of electricity to restore a normal rhythm to the heart. A rhythm that is too fast or is not regular keeps the heart from pumping well. In this procedure, sticky patches or metal paddles are placed on the chest to deliver electricity to the heart from a device. This procedure may be done in an emergency if:  There is low or no blood pressure as a result of the heart rhythm.  Normal rhythm must be restored as fast as possible to protect the brain and heart from further damage.  It may save a life. This may also be a scheduled procedure for irregular or fast heart rhythms that are not immediately life-threatening. Tell a health care provider about:  Any allergies you have.  All medicines you are taking, including vitamins, herbs, eye drops, creams, and over-the-counter medicines.  Any problems you or family members have had with anesthetic medicines.  Any blood disorders you have.  Any surgeries you have had.  Any medical conditions you have.  Whether you are pregnant or may be pregnant. What are the risks? Generally, this is a safe procedure. However, problems may occur, including:  Allergic reactions to medicines.  A blood clot that breaks free and travels to other parts of your body.  The possible return of an abnormal heart rhythm within hours or days after the procedure.  Your heart stopping (cardiac arrest). This is rare. What happens before the procedure? Medicines  Your health care provider may have you start taking: ? Blood-thinning medicines (anticoagulants) so your blood does not clot as easily. ? Medicines to help stabilize your heart rate and rhythm.  Ask your health care provider about: ? Changing or stopping your regular medicines. This is especially important if you are taking diabetes  medicines or blood thinners. ? Taking medicines such as aspirin and ibuprofen. These medicines can thin your blood. Do not take these medicines unless your health care provider tells you to take them. ? Taking over-the-counter medicines, vitamins, herbs, and supplements. General instructions  Follow instructions from your health care provider about eating or drinking restrictions.  Plan to have someone take you home from the hospital or clinic.  If you will be going home right after the procedure, plan to have someone with you for 24 hours.  Ask your health care provider what steps will be taken to help prevent infection. These may include washing your skin with a germ-killing soap. What happens during the procedure?   An IV will be inserted into one of your veins.  Sticky patches (electrodes) or metal paddles may be placed on your chest.  You will be given a medicine to help you relax (sedative).  An electrical shock will be delivered. The procedure may vary among health care providers and hospitals. What can I expect after the procedure?  Your blood pressure, heart rate, breathing rate, and blood oxygen level will be monitored until you leave the hospital or clinic.  Your heart rhythm will be watched to make sure it does not change.  You may have some redness on the skin where the shocks were given. Follow these instructions at home:  Do not drive for 24 hours if you were given a sedative during your procedure.  Take over-the-counter and prescription medicines only as told by your health care provider.  Ask your health care  provider how to check your pulse. Check it often.  Rest for 48 hours after the procedure or as told by your health care provider.  Avoid or limit your caffeine use as told by your health care provider.  Keep all follow-up visits as told by your health care provider. This is important. Contact a health care provider if:  You feel like your heart is  beating too quickly or your pulse is not regular.  You have a serious muscle cramp that does not go away. Get help right away if:  You have discomfort in your chest.  You are dizzy or you feel faint.  You have trouble breathing or you are short of breath.  Your speech is slurred.  You have trouble moving an arm or leg on one side of your body.  Your fingers or toes turn cold or blue. Summary  Electrical cardioversion is the delivery of a jolt of electricity to restore a normal rhythm to the heart.  This procedure may be done right away in an emergency or may be a scheduled procedure if the condition is not an emergency.  Generally, this is a safe procedure.  After the procedure, check your pulse often as told by your health care provider. This information is not intended to replace advice given to you by your health care provider. Make sure you discuss any questions you have with your health care provider. Document Revised: 12/11/2018 Document Reviewed: 12/11/2018 Elsevier Patient Education  2020 [employer].

## 2019-09-11 NOTE — Interval H&P Note (Signed)
History and Physical Interval Note: No changes.  Will proceed with cardioversion as planned.  09/11/2019 10:57 AM  Tina [name]  has presented today for surgery, with the diagnosis of a-fib.  The various methods of treatment have been discussed with the patient and family. After consideration of risks, benefits and other options for treatment, the patient has consented to  Procedure(s): CARDIOVERSION (N/A) as a surgical intervention.  The patient's history has been reviewed, patient examined, no change in status, stable for surgery.  I have reviewed the patient's chart and labs.  Questions were answered to the patient's satisfaction.     Prentice Docker

## 2019-09-12 ENCOUNTER — Other Ambulatory Visit: Payer: Self-pay | Admitting: *Deleted

## 2019-09-12 ENCOUNTER — Ambulatory Visit: Payer: Medicare Other

## 2019-09-12 ENCOUNTER — Telehealth: Payer: Self-pay

## 2019-09-12 MED ORDER — HYDROCODONE-ACETAMINOPHEN 5-325 MG PO TABS: 1.0000 | ORAL_TABLET | Freq: Two times a day (BID) | ORAL | 0 refills | Status: AC | PRN

## 2019-09-12 NOTE — Telephone Encounter (Addendum)
Front Information systems manager received call from patient's god daughter.She states she was too exhausted to come for EKG today    Dr.Koneswaran aware

## 2019-09-12 NOTE — Telephone Encounter (Signed)
Received call from patient niece, [name].   Requested refill on Hydrocodone/APAP.   Ok to refill??  Last office visit 08/15/2019.  Last refill 07/30/2019.

## 2019-09-21 ENCOUNTER — Telehealth: Payer: Self-pay | Admitting: Family Medicine

## 2019-09-21 NOTE — Telephone Encounter (Signed)
I received a phone call from EMS on Wednesday, April 28 at 10 PM.  Patient was found deceased in her home.  No one has seen her in about 2 to 3 days.  No foul play with suspected.  Death Certificate will be routed to my office  Cardiac arrest, CHF, A FIB, Uncontrolled DM

## 2019-09-22 DEATH — deceased

## 2019-09-24 ENCOUNTER — Other Ambulatory Visit: Payer: Self-pay | Admitting: Cardiovascular Disease

## 2019-09-27 ENCOUNTER — Ambulatory Visit: Payer: Medicare Other | Admitting: Internal Medicine

## 2019-10-03 ENCOUNTER — Ambulatory Visit: Payer: Medicare Other | Admitting: Internal Medicine

## 2019-10-18 ENCOUNTER — Ambulatory Visit: Payer: Medicare Other

## 2019-10-26 ENCOUNTER — Ambulatory Visit: Payer: Medicare Other | Admitting: Internal Medicine

## 2019-12-18 ENCOUNTER — Encounter: Payer: Medicare Other | Admitting: Family Medicine
